# Patient Record
Sex: Female | Born: 1940 | Race: White | Hispanic: No | Marital: Married | Smoking: Never smoker
Health system: Southern US, Community
[De-identification: ages and names within clinical notes are randomized; demographics above are authoritative.]

## PROBLEM LIST (undated history)

## (undated) DIAGNOSIS — I5022 Chronic systolic (congestive) heart failure: Secondary | ICD-10-CM

## (undated) DIAGNOSIS — E785 Hyperlipidemia, unspecified: Secondary | ICD-10-CM

## (undated) DIAGNOSIS — J45909 Unspecified asthma, uncomplicated: Secondary | ICD-10-CM

## (undated) DIAGNOSIS — N189 Chronic kidney disease, unspecified: Secondary | ICD-10-CM

## (undated) DIAGNOSIS — B019 Varicella without complication: Secondary | ICD-10-CM

## (undated) DIAGNOSIS — C50919 Malignant neoplasm of unspecified site of unspecified female breast: Secondary | ICD-10-CM

## (undated) DIAGNOSIS — R002 Palpitations: Secondary | ICD-10-CM

## (undated) DIAGNOSIS — T7840XA Allergy, unspecified, initial encounter: Secondary | ICD-10-CM

## (undated) DIAGNOSIS — K5900 Constipation, unspecified: Secondary | ICD-10-CM

## (undated) DIAGNOSIS — K635 Polyp of colon: Secondary | ICD-10-CM

## (undated) DIAGNOSIS — I2510 Atherosclerotic heart disease of native coronary artery without angina pectoris: Secondary | ICD-10-CM

## (undated) DIAGNOSIS — E079 Disorder of thyroid, unspecified: Secondary | ICD-10-CM

## (undated) DIAGNOSIS — I255 Ischemic cardiomyopathy: Secondary | ICD-10-CM

## (undated) DIAGNOSIS — Z974 Presence of external hearing-aid: Secondary | ICD-10-CM

## (undated) DIAGNOSIS — E039 Hypothyroidism, unspecified: Secondary | ICD-10-CM

## (undated) DIAGNOSIS — M1990 Unspecified osteoarthritis, unspecified site: Secondary | ICD-10-CM

## (undated) DIAGNOSIS — Z923 Personal history of irradiation: Secondary | ICD-10-CM

## (undated) DIAGNOSIS — H269 Unspecified cataract: Secondary | ICD-10-CM

## (undated) HISTORY — DX: Disorder of thyroid, unspecified: E07.9

## (undated) HISTORY — DX: Unspecified cataract: H26.9

## (undated) HISTORY — DX: Atherosclerotic heart disease of native coronary artery without angina pectoris: I25.10

## (undated) HISTORY — DX: Polyp of colon: K63.5

## (undated) HISTORY — DX: Unspecified asthma, uncomplicated: J45.909

## (undated) HISTORY — PX: COLONOSCOPY W/ POLYPECTOMY: SHX1380

## (undated) HISTORY — PX: FINGER FRACTURE SURGERY: SHX638

## (undated) HISTORY — PX: COLONOSCOPY: SHX174

## (undated) HISTORY — DX: Varicella without complication: B01.9

## (undated) HISTORY — DX: Chronic systolic (congestive) heart failure: I50.22

## (undated) HISTORY — DX: Malignant neoplasm of unspecified site of unspecified female breast: C50.919

## (undated) HISTORY — PX: ROTATOR CUFF REPAIR: SHX139

## (undated) HISTORY — DX: Allergy, unspecified, initial encounter: T78.40XA

## (undated) HISTORY — DX: Ischemic cardiomyopathy: I25.5

## (undated) HISTORY — DX: Hyperlipidemia, unspecified: E78.5

## (undated) HISTORY — PX: JOINT REPLACEMENT: SHX530

---
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
## 1979-08-22 HISTORY — PX: ABDOMINAL HYSTERECTOMY: SHX81

## 1999-09-09 ENCOUNTER — Encounter (INDEPENDENT_AMBULATORY_CARE_PROVIDER_SITE_OTHER): Payer: Self-pay | Admitting: Specialist

## 1999-09-09 ENCOUNTER — Other Ambulatory Visit: Admission: RE | Admit: 1999-09-09 | Discharge: 1999-09-09 | Payer: Self-pay | Admitting: Gastroenterology

## 2007-10-16 ENCOUNTER — Ambulatory Visit: Payer: Self-pay | Admitting: Gastroenterology

## 2007-10-29 ENCOUNTER — Ambulatory Visit: Payer: Self-pay | Admitting: Gastroenterology

## 2009-08-21 LAB — HM COLONOSCOPY

## 2010-08-21 HISTORY — PX: APPENDECTOMY: SHX54

## 2010-08-21 LAB — HM PAP SMEAR

## 2011-04-08 ENCOUNTER — Ambulatory Visit: Payer: Self-pay | Admitting: Family Medicine

## 2011-04-08 ENCOUNTER — Observation Stay: Payer: Self-pay | Admitting: Surgery

## 2011-04-12 LAB — PATHOLOGY REPORT

## 2011-06-22 LAB — HM MAMMOGRAPHY

## 2012-05-01 ENCOUNTER — Ambulatory Visit (INDEPENDENT_AMBULATORY_CARE_PROVIDER_SITE_OTHER): Payer: Medicare Other | Admitting: Internal Medicine

## 2012-05-01 ENCOUNTER — Encounter: Payer: Self-pay | Admitting: Internal Medicine

## 2012-05-01 VITALS — BP 130/70 | HR 64 | Temp 98.6°F | Ht 61.5 in | Wt 153.5 lb

## 2012-05-01 DIAGNOSIS — E785 Hyperlipidemia, unspecified: Secondary | ICD-10-CM

## 2012-05-01 DIAGNOSIS — I10 Essential (primary) hypertension: Secondary | ICD-10-CM

## 2012-05-01 DIAGNOSIS — Z1239 Encounter for other screening for malignant neoplasm of breast: Secondary | ICD-10-CM

## 2012-05-01 DIAGNOSIS — Z23 Encounter for immunization: Secondary | ICD-10-CM

## 2012-05-01 DIAGNOSIS — E039 Hypothyroidism, unspecified: Secondary | ICD-10-CM | POA: Insufficient documentation

## 2012-05-01 DIAGNOSIS — E559 Vitamin D deficiency, unspecified: Secondary | ICD-10-CM | POA: Insufficient documentation

## 2012-05-01 DIAGNOSIS — D649 Anemia, unspecified: Secondary | ICD-10-CM

## 2012-05-01 MED ORDER — ACEBUTOLOL HCL 200 MG PO CAPS: 200.0000 mg | ORAL_CAPSULE | Freq: Every day | ORAL | Status: DC

## 2012-05-01 MED ORDER — LEVOTHYROXINE SODIUM 75 MCG PO TABS: 75.0000 ug | ORAL_TABLET | Freq: Every day | ORAL | Status: DC

## 2012-05-01 NOTE — Progress Notes (Signed)
Subjective:    Patient ID: Gwendolyn King, female    DOB: Dec 04, 1940, 71 y.o.   MRN: [id number]  HPI 71 year old female with history of allergies, hypothyroidism presents to establish care. She reports she is generally feeling well. She reports full compliance with her medications. Her allergies are well controlled with current medications. She notes some stress in caring for her elderly mother. Aside from that, she reports she is doing well. She tries to follow a healthy diet and get regular physical activity with [employer]. She questions today about the utility of calcium supplementation. She notes a family history of osteoporosis.  Outpatient Encounter Prescriptions as of 05/01/2012  Medication Sig Dispense Refill  . acebutolol (SECTRAL) 200 MG capsule Take 1 capsule (200 mg total) by mouth daily.  90 capsule  4  . aspirin 81 MG tablet Take 81 mg by mouth daily.      . Biotin (BIOTIN 5000) 5 MG CAPS Take 1 capsule by mouth daily.      . Cholecalciferol (VITAMIN D-3) 1000 UNITS CAPS Take 1 capsule by mouth daily.      . Coenzyme Q10 (COQ-10) 100 MG CAPS Take 1 capsule by mouth daily.      . fexofenadine (ALLEGRA) 180 MG tablet Take 180 mg by mouth daily.      . fluticasone (FLONASE) 50 MCG/ACT nasal spray Place 2 sprays into the nose daily.      . furosemide (LASIX) 20 MG tablet Take 20 mg by mouth as needed.      Marland Kitchen levothyroxine (SYNTHROID) 75 MCG tablet Take 1 tablet (75 mcg total) by mouth daily. Brand name only  90 tablet  4  . Multiple Vitamin (MULTIVITAMIN) tablet Take 1 tablet by mouth daily.      Marland Kitchen DISCONTD: acebutolol (SECTRAL) 200 MG capsule Take 200 mg by mouth daily.      Marland Kitchen DISCONTD: levothyroxine (SYNTHROID) 75 MCG tablet Take 75 mcg by mouth daily. Brand name only        Review of Systems  Constitutional: Negative for fever, chills, appetite change, fatigue and unexpected weight change.  HENT: Negative for ear pain, congestion, sore throat, trouble swallowing, neck pain, voice  change and sinus pressure.   Eyes: Negative for visual disturbance.  Respiratory: Negative for cough, shortness of breath, wheezing and stridor.   Cardiovascular: Negative for chest pain, palpitations and leg swelling.  Gastrointestinal: Negative for nausea, vomiting, abdominal pain, diarrhea, constipation, blood in stool, abdominal distention and anal bleeding.  Genitourinary: Negative for dysuria and flank pain.  Musculoskeletal: Negative for myalgias, arthralgias and gait problem.  Skin: Negative for color change and rash.  Neurological: Negative for dizziness and headaches.  Hematological: Negative for adenopathy. Does not bruise/bleed easily.  Psychiatric/Behavioral: Negative for suicidal ideas, disturbed wake/sleep cycle and dysphoric mood. The patient is not nervous/anxious.        Objective:   Physical Exam  Constitutional: She is oriented to person, place, and time. She appears well-developed and well-nourished. No distress.  HENT:  Head: Normocephalic and atraumatic.  Right Ear: External ear normal.  Left Ear: External ear normal.  Nose: Nose normal.  Mouth/Throat: Oropharynx is clear and moist. No oropharyngeal exudate.  Eyes: Conjunctivae normal are normal. Pupils are equal, round, and reactive to light. Right eye exhibits no discharge. Left eye exhibits no discharge. No scleral icterus.  Neck: Normal range of motion. Neck supple. No tracheal deviation present. No thyromegaly present.  Cardiovascular: Normal rate, regular rhythm, normal heart sounds and intact distal  pulses.  Exam reveals no gallop and no friction rub.   No murmur heard. Pulmonary/Chest: Effort normal and breath sounds normal. No respiratory distress. She has no wheezes. She has no rales. She exhibits no tenderness.  Abdominal: Soft. Bowel sounds are normal. She exhibits no distension. There is no tenderness.  Musculoskeletal: Normal range of motion. She exhibits no edema and no tenderness.  Lymphadenopathy:     She has no cervical adenopathy.  Neurological: She is alert and oriented to person, place, and time. No cranial nerve deficit. She exhibits normal muscle tone. Coordination normal.  Skin: Skin is warm and dry. No rash noted. She is not diaphoretic. No erythema. No pallor.  Psychiatric: She has a normal mood and affect. Her behavior is normal. Judgment and thought content normal.          Assessment & Plan:

## 2012-05-01 NOTE — Assessment & Plan Note (Signed)
Symptomatically doing well. Will check TSH with labs in November 2013. Continue Synthroid.

## 2012-05-01 NOTE — Assessment & Plan Note (Signed)
Blood pressure well-controlled. Will check renal function with labs in November 2013. Will request records on previous evaluation and management.

## 2012-05-01 NOTE — Assessment & Plan Note (Signed)
Patient with history of vitamin D deficiency in the past. We'll continue 1000 units of vitamin D daily. We discussed the potential risk of calcium supplementation. Will plan to get calcium through diet only. Followup in November 2013

## 2012-07-02 ENCOUNTER — Encounter: Payer: Medicare Other | Admitting: Internal Medicine

## 2012-07-05 ENCOUNTER — Ambulatory Visit (INDEPENDENT_AMBULATORY_CARE_PROVIDER_SITE_OTHER): Payer: Medicare Other | Admitting: Internal Medicine

## 2012-07-05 ENCOUNTER — Encounter: Payer: Self-pay | Admitting: Internal Medicine

## 2012-07-05 VITALS — BP 112/72 | HR 66 | Temp 98.7°F | Ht 61.5 in | Wt 152.2 lb

## 2012-07-05 DIAGNOSIS — Z23 Encounter for immunization: Secondary | ICD-10-CM

## 2012-07-05 DIAGNOSIS — E039 Hypothyroidism, unspecified: Secondary | ICD-10-CM

## 2012-07-05 DIAGNOSIS — I10 Essential (primary) hypertension: Secondary | ICD-10-CM

## 2012-07-05 DIAGNOSIS — Z0000 Encounter for general adult medical examination without abnormal findings: Secondary | ICD-10-CM

## 2012-07-05 MED ORDER — LEVOTHYROXINE SODIUM 75 MCG PO TABS: 75.0000 ug | ORAL_TABLET | Freq: Every day | ORAL | Status: DC

## 2012-07-05 MED ORDER — ACEBUTOLOL HCL 200 MG PO CAPS: 200.0000 mg | ORAL_CAPSULE | Freq: Every day | ORAL | Status: DC

## 2012-07-05 NOTE — Progress Notes (Signed)
Subjective:    Patient ID: Gwendolyn King, female    DOB: [birth year], 71 y.o.   MRN: [id number]  HPI The patient is here for annual Medicare wellness examination and management of other chronic and acute problems.   The risk factors are reflected in the social history.  The roster of all physicians providing medical care to patient - is listed in the Snapshot section of the chart.  Activities of daily living:  The patient is 100% independent in all ADLs: dressing, toileting, feeding as well as independent mobility  Home safety : The patient has smoke detectors in the home. They wear seatbelts.  There are locked firearms at home. There is no violence in the home.   There is no risks for hepatitis, STDs or HIV. There is no history of blood transfusion. They have no travel history to infectious disease endemic areas of the world.  The patient has seen their dentist in the last six month. (Dr. Rubye Oaks) They have seen their eye doctor in the last year. (Dr. Covert - [facility]) No issues with hearing. They have deferred audiologic testing in the last year.  Chronic tinnitis. They do not  have excessive sun exposure. Discussed the need for sun protection: hats, long sleeves and use of sunscreen if there is significant sun exposure. Dermatologist - Gwendolyn King.  Diet: the importance of a healthy diet is discussed. They do have a healthy diet.  The benefits of regular aerobic exercise were discussed. She participates in [employer] 4 x per week.  Depression screen: there are no signs or vegative symptoms of depression- irritability, change in appetite, anhedonia, sadness/tearfullness.  Cognitive assessment: the patient manages all their financial and personal affairs and is actively engaged. They could relate day,date,year and events.  The following portions of the patient's history were reviewed and updated as appropriate: allergies, current medications, past family history, past medical history,  past  surgical history, past social history  and problem list.  Visual acuity was not assessed per patient preference since she has regular follow up with her ophthalmologist. Hearing and body mass index were assessed and reviewed.   During the course of the visit the patient was educated and counseled about appropriate screening and preventive services including : fall prevention , diabetes screening, nutrition counseling, colorectal cancer screening, and recommended immunizations.    HCPOA - pending.  Outpatient Encounter Prescriptions as of 07/05/2012  Medication Sig Dispense Refill  . acebutolol (SECTRAL) 200 MG capsule Take 1 capsule (200 mg total) by mouth daily.  90 capsule  4  . aspirin 81 MG tablet Take 81 mg by mouth daily.      . Biotin (BIOTIN 5000) 5 MG CAPS Take 1 capsule by mouth daily.      . Cholecalciferol (VITAMIN D-3) 1000 UNITS CAPS Take 1 capsule by mouth daily.      . Coenzyme Q10 (COQ-10) 100 MG CAPS Take 1 capsule by mouth daily.      . fexofenadine (ALLEGRA) 180 MG tablet Take 180 mg by mouth daily.      . fluticasone (FLONASE) 50 MCG/ACT nasal spray Place 2 sprays into the nose daily.      . furosemide (LASIX) 20 MG tablet Take 20 mg by mouth as needed.      Marland Kitchen levothyroxine (SYNTHROID) 75 MCG tablet Take 1 tablet (75 mcg total) by mouth daily. Brand name only  90 tablet  4  . Multiple Vitamin (MULTIVITAMIN) tablet Take 1 tablet by mouth daily.      . [  DISCONTINUED] acebutolol (SECTRAL) 200 MG capsule Take 1 capsule (200 mg total) by mouth daily.  90 capsule  4  . [DISCONTINUED] levothyroxine (SYNTHROID) 75 MCG tablet Take 1 tablet (75 mcg total) by mouth daily. Brand name only  90 tablet  4    BP 112/72  Pulse 66  Temp 98.7 F (37.1 C) (Oral)  Ht 5' 1.5" (1.562 m)  Wt 152 lb 4 oz (69.06 kg)  BMI 28.30 kg/m2  SpO2 97%    Review of Systems  Constitutional: Negative for fever, chills, appetite change, fatigue and unexpected weight change.  HENT: Negative for ear  pain, congestion, sore throat, trouble swallowing, neck pain, voice change and sinus pressure.   Eyes: Negative for visual disturbance.  Respiratory: Negative for cough, shortness of breath, wheezing and stridor.   Cardiovascular: Negative for chest pain, palpitations and leg swelling.  Gastrointestinal: Negative for nausea, vomiting, abdominal pain, diarrhea, constipation, blood in stool, abdominal distention and anal bleeding.  Genitourinary: Negative for dysuria and flank pain.  Musculoskeletal: Negative for myalgias, arthralgias and gait problem.  Skin: Negative for color change and rash.  Neurological: Negative for dizziness and headaches.  Hematological: Negative for adenopathy. Does not bruise/bleed easily.  Psychiatric/Behavioral: Negative for suicidal ideas, sleep disturbance and dysphoric mood. The patient is not nervous/anxious.        Objective:   Physical Exam  Constitutional: She is oriented to person, place, and time. She appears well-developed and well-nourished. No distress.  HENT:  Head: Normocephalic and atraumatic.  Right Ear: External ear normal.  Left Ear: External ear normal.  Nose: Nose normal.  Mouth/Throat: Oropharynx is clear and moist. No oropharyngeal exudate.  Eyes: Conjunctivae normal are normal. Pupils are equal, round, and reactive to light. Right eye exhibits no discharge. Left eye exhibits no discharge. No scleral icterus.  Neck: Normal range of motion. Neck supple. No tracheal deviation present. No thyromegaly present.  Cardiovascular: Normal rate, regular rhythm, normal heart sounds and intact distal pulses.  Exam reveals no gallop and no friction rub.   No murmur heard. Pulmonary/Chest: Effort normal and breath sounds normal. No accessory muscle usage. Not tachypneic. No respiratory distress. She has no decreased breath sounds. She has no wheezes. She has no rales. She exhibits no tenderness. Right breast exhibits no inverted nipple, no mass, no nipple  discharge, no skin change and no tenderness. Left breast exhibits no inverted nipple, no mass, no nipple discharge, no skin change and no tenderness. Breasts are symmetrical.  Abdominal: Soft. Bowel sounds are normal. She exhibits no distension. There is no tenderness.  Musculoskeletal: Normal range of motion. She exhibits no edema and no tenderness.  Lymphadenopathy:    She has no cervical adenopathy.  Neurological: She is alert and oriented to person, place, and time. No cranial nerve deficit. She exhibits normal muscle tone. Coordination normal.  Skin: Skin is warm and dry. No rash noted. She is not diaphoretic. No erythema. No pallor.  Psychiatric: She has a normal mood and affect. Her behavior is normal. Judgment and thought content normal.          Assessment & Plan:

## 2012-07-06 DIAGNOSIS — Z0000 Encounter for general adult medical examination without abnormal findings: Secondary | ICD-10-CM | POA: Insufficient documentation

## 2012-07-06 LAB — LIPID PANEL
Cholesterol: 243 mg/dL — ABNORMAL HIGH (ref 0–200)
HDL: 42 mg/dL
Total CHOL/HDL Ratio: 5.8 ratio
Triglycerides: 454 mg/dL — ABNORMAL HIGH

## 2012-07-06 LAB — CBC WITH DIFFERENTIAL/PLATELET
Basophils Absolute: 0 10*3/uL (ref 0.0–0.1)
Basophils Relative: 0 % (ref 0–1)
Eosinophils Absolute: 0.3 10*3/uL (ref 0.0–0.7)
Eosinophils Relative: 3 % (ref 0–5)
HCT: 43.1 % (ref 36.0–46.0)
Hemoglobin: 14.9 g/dL (ref 12.0–15.0)
Lymphocytes Relative: 39 % (ref 12–46)
Lymphs Abs: 3.4 10*3/uL (ref 0.7–4.0)
MCH: 29.6 pg (ref 26.0–34.0)
MCHC: 34.6 g/dL (ref 30.0–36.0)
MCV: 85.7 fL (ref 78.0–100.0)
Monocytes Absolute: 0.7 10*3/uL (ref 0.1–1.0)
Monocytes Relative: 8 % (ref 3–12)
Neutro Abs: 4.4 10*3/uL (ref 1.7–7.7)
Neutrophils Relative %: 50 % (ref 43–77)
Platelets: 155 10*3/uL (ref 150–400)
RBC: 5.03 MIL/uL (ref 3.87–5.11)
RDW: 14.1 % (ref 11.5–15.5)
WBC: 8.8 10*3/uL (ref 4.0–10.5)

## 2012-07-06 LAB — COMPREHENSIVE METABOLIC PANEL WITH GFR
ALT: 18 U/L (ref 0–35)
AST: 18 U/L (ref 0–37)
Albumin: 4.3 g/dL (ref 3.5–5.2)
Alkaline Phosphatase: 56 U/L (ref 39–117)
BUN: 38 mg/dL — ABNORMAL HIGH (ref 6–23)
CO2: 28 meq/L (ref 19–32)
Calcium: 10 mg/dL (ref 8.4–10.5)
Chloride: 102 meq/L (ref 96–112)
Creat: 1.28 mg/dL — ABNORMAL HIGH (ref 0.50–1.10)
Glucose, Bld: 71 mg/dL (ref 70–99)
Potassium: 4.7 meq/L (ref 3.5–5.3)
Sodium: 140 meq/L (ref 135–145)
Total Bilirubin: 0.5 mg/dL (ref 0.3–1.2)
Total Protein: 6.6 g/dL (ref 6.0–8.3)

## 2012-07-06 LAB — TSH: TSH: 0.957 u[IU]/mL (ref 0.350–4.500)

## 2012-07-06 LAB — VITAMIN D 25 HYDROXY (VIT D DEFICIENCY, FRACTURES): Vit D, 25-Hydroxy: 44 ng/mL (ref 30–89)

## 2012-07-06 NOTE — Assessment & Plan Note (Signed)
General medical exam including breast exam normal today. Pap deferred as Pap smear up-to-date. Health maintenance is up to date. Appropriate screening performed. Will check labs today including CBC, CMP, lipid profile. Followup in 6 months or sooner as needed.

## 2012-07-24 ENCOUNTER — Telehealth: Payer: Self-pay | Admitting: Internal Medicine

## 2012-07-24 ENCOUNTER — Other Ambulatory Visit (INDEPENDENT_AMBULATORY_CARE_PROVIDER_SITE_OTHER): Payer: Medicare Other

## 2012-07-24 DIAGNOSIS — D649 Anemia, unspecified: Secondary | ICD-10-CM

## 2012-07-24 DIAGNOSIS — E039 Hypothyroidism, unspecified: Secondary | ICD-10-CM

## 2012-07-24 DIAGNOSIS — E559 Vitamin D deficiency, unspecified: Secondary | ICD-10-CM

## 2012-07-24 DIAGNOSIS — E785 Hyperlipidemia, unspecified: Secondary | ICD-10-CM

## 2012-07-24 LAB — CBC WITH DIFFERENTIAL/PLATELET
Basophils Absolute: 0 10*3/uL (ref 0.0–0.1)
Basophils Relative: 0.6 % (ref 0.0–3.0)
Eosinophils Absolute: 0.3 10*3/uL (ref 0.0–0.7)
Eosinophils Relative: 6.4 % — ABNORMAL HIGH (ref 0.0–5.0)
HCT: 41.7 % (ref 36.0–46.0)
Hemoglobin: 13.8 g/dL (ref 12.0–15.0)
Lymphocytes Relative: 36.4 % (ref 12.0–46.0)
Lymphs Abs: 1.9 10*3/uL (ref 0.7–4.0)
MCHC: 33.1 g/dL (ref 30.0–36.0)
MCV: 88 fl (ref 78.0–100.0)
Monocytes Absolute: 0.5 10*3/uL (ref 0.1–1.0)
Monocytes Relative: 9 % (ref 3.0–12.0)
Neutro Abs: 2.4 10*3/uL (ref 1.4–7.7)
Neutrophils Relative %: 47.6 % (ref 43.0–77.0)
Platelets: 191 10*3/uL (ref 150.0–400.0)
RBC: 4.74 Mil/uL (ref 3.87–5.11)
RDW: 14.3 % (ref 11.5–14.6)
WBC: 5.1 10*3/uL (ref 4.5–10.5)

## 2012-07-24 LAB — LIPID PANEL
Cholesterol: 194 mg/dL (ref 0–200)
HDL: 44.1 mg/dL
LDL Cholesterol: 112 mg/dL — ABNORMAL HIGH (ref 0–99)
Total CHOL/HDL Ratio: 4
Triglycerides: 191 mg/dL — ABNORMAL HIGH (ref 0.0–149.0)
VLDL: 38.2 mg/dL (ref 0.0–40.0)

## 2012-07-24 LAB — TSH: TSH: 1.31 u[IU]/mL (ref 0.35–5.50)

## 2012-07-24 NOTE — Telephone Encounter (Signed)
Patient called in asking about her bill. I have sent an e mail to Annabell and will contact patient once I hear from her.

## 2012-07-25 LAB — VITAMIN D 25 HYDROXY (VIT D DEFICIENCY, FRACTURES): Vit D, 25-Hydroxy: 48 ng/mL (ref 30–89)

## 2012-08-12 ENCOUNTER — Encounter: Payer: Self-pay | Admitting: Internal Medicine

## 2012-09-09 ENCOUNTER — Telehealth: Payer: Self-pay | Admitting: Internal Medicine

## 2012-09-09 ENCOUNTER — Ambulatory Visit (INDEPENDENT_AMBULATORY_CARE_PROVIDER_SITE_OTHER): Payer: Medicare Other | Admitting: Internal Medicine

## 2012-09-09 ENCOUNTER — Encounter: Payer: Self-pay | Admitting: Internal Medicine

## 2012-09-09 VITALS — BP 124/82 | HR 64 | Temp 97.8°F | Resp 16 | Wt 155.5 lb

## 2012-09-09 DIAGNOSIS — N390 Urinary tract infection, site not specified: Secondary | ICD-10-CM | POA: Insufficient documentation

## 2012-09-09 DIAGNOSIS — J069 Acute upper respiratory infection, unspecified: Secondary | ICD-10-CM | POA: Insufficient documentation

## 2012-09-09 DIAGNOSIS — R300 Dysuria: Secondary | ICD-10-CM

## 2012-09-09 LAB — POCT URINALYSIS DIPSTICK
Bilirubin, UA: NEGATIVE
Glucose, UA: NEGATIVE
Ketones, UA: NEGATIVE
Nitrite, UA: NEGATIVE
Protein, UA: NEGATIVE
Spec Grav, UA: <=1.005
Urobilinogen, UA: 0.2
pH, UA: 7

## 2012-09-09 MED ORDER — LEVOFLOXACIN 500 MG PO TABS: 500.0000 mg | ORAL_TABLET | Freq: Every day | ORAL | Status: DC

## 2012-09-09 NOTE — Telephone Encounter (Signed)
Patient Information:  Caller Name: Arabella  Phone: [phone]  Patient: Janayah, Zavada  Gender: Female  DOB: 1941/05/15  Age: 72 Years  PCP: Ronna Polio (Adults only)  Office Follow Up:  Does the office need to follow up with this patient?: No  Instructions For The Office: N/A   Symptoms  Reason For Call & Symptoms: Having buring with urination.  Reviewed Health History In EMR: Yes  Reviewed Medications In EMR: Yes  Reviewed Allergies In EMR: Yes  Reviewed Surgeries / Procedures: Yes  Date of Onset of Symptoms: 09/09/2012  Guideline(s) Used:  Urination Pain - Female  Disposition Per Guideline:   See Today in Office  Reason For Disposition Reached:   Age > 50 years  Advice Given:  Fluids:   Drink extra fluids. Drink 8-10 glasses of liquids a day (Reason: to produce a dilute, non-irritating urine).  Cranberry Juice:   Some people think that drinking cranberry juice may help in fighting urinary tract infections. However, there is no good research that has ever proved this.  Dosage 100% Cranberry Juice: 1 oz (30 ml) twice a day.  Caution: Do not drink more than 16 oz (480 ml). Here is the reason: too much cranberry juice can also be irritating to the bladder.  Appointment Scheduled:  09/09/2012 16:00:00 Appointment Scheduled Provider:  Duncan Dull (Adults only)

## 2012-09-09 NOTE — Assessment & Plan Note (Addendum)
Currently viral given the mild HEENT  symptoms  I have explained that in viral URIS, an antibiotic will not help the symptoms and will increase the risk of developing diarrhea.,  Continue oral and nasal decongestants,  Ibuprofen 400 mg and tylenol 650 mq 8 hrs for aches and pains,  Delsym OTC for cough,  Will be taking levaquin for UTI. Call if fevers,  severe facial or ear pain pr grossly purulent nasal discharge or sputum develop.

## 2012-09-09 NOTE — Patient Instructions (Signed)
I am treating your for a urinary tract infection with an antibiotic that will also treat any bacterial infection you may be getting in your sinuses,  chest or ears (Levaquin)   I also advise use of the following OTC meds to help with your other symptoms.   Take generic OTC benadryl 25 mg every 8 hours for the drainage,  Sudafed PE  10 to 30 mg every 8 hours for the congestion, you may substitute Afrin nasal spray for the nighttime dose of sudafed PE  If needed to prevent insomnia.  flushes your sinuses twice daily with Simply Saline (do over the sink because if you do it right you will spit out globs of mucus)  Use OTC  Delsym   FOR THE COUGH.  Gargle with salt water as needed for sore throat.

## 2012-09-09 NOTE — Progress Notes (Signed)
Patient ID: Gwendolyn King, female   DOB: 1941/04/06, 72 y.o.   MRN: [id number]  Patient Active Problem List  Diagnosis  . Hypothyroidism  . Hypertension  . Vitamin d deficiency  . Medicare annual wellness visit, subsequent  . UTI (lower urinary tract infection)  . Acute URI    Subjective:  CC:   Chief Complaint  Patient presents with  . Urinary Tract Infection    HPI:   Gwendolyn King a 72 y.o. female who presents Vaginal irritation started last week improved transiently with OTC summer's eve douche, then returned today . Woke up this morning with dysuria .  Last week had an acute URI and took a cold remedy ,  Having nasal drainage,  singus congestion, since Thursday chest aches with coughing . No fevers, no productive cough, buyt wasn't coughing until today   Past Medical History  Diagnosis Date  . Chicken pox   . Hyperlipidemia   . Colon polyp   . Thyroid disease   . Asthma     as a child, dad was smoker  . Allergy     hay fever, fall allergies    Past Surgical History  Procedure Date  . Appendectomy 2012  . Tonsillectomy and adenoidectomy 1963  . Abdominal hysterectomy 1981  . Vaginal delivery     3    The following portions of the patient's history were reviewed and updated as appropriate: Allergies, current medications, and problem list.    Review of Systems:  Patient denies , fevers, malaise, unintentional weight loss, skin rash, eye pain,  sore throat, dysphagia,  hemoptysis , dyspnea, wheezing, chest pain, palpitations, orthopnea, edema, abdominal pain, nausea, melena, diarrhea, constipation, flank pain, nocturia, numbness, tingling, seizures,  Focal weakness, Loss of consciousness,  Tremor, insomnia, depression, anxiety, and suicidal ideation.        History   Social History  . Marital Status: Married    Spouse Name: N/A    Number of Children: N/A  . Years of Education: N/A   Occupational History  . Not on file.   Social History Main  Topics  . Smoking status: Never Smoker   . Smokeless tobacco: Not on file  . Alcohol Use: No  . Drug Use: Not on file  . Sexually Active: Not on file   Other Topics Concern  . Not on file   Social History Narrative   Lives with husband in [place]. 3 children.Work - retired, Radio producer, teacherDiet - regular, limits sugar, eats small mealsExercise - [employer] 4 days per week    Objective:  BP 124/82  Pulse 64  Temp 97.8 F (36.6 C) (Oral)  Resp 16  Wt 155 lb 8 oz (70.534 kg)  SpO2 99%  General appearance: alert, cooperative and appears stated age Ears: normal TM's and external ear canals both ears Throat: lips, mucosa, and tongue normal; teeth and gums normal Neck: no adenopathy, no carotid bruit, supple, symmetrical, trachea midline and thyroid not enlarged, symmetric, no tenderness/mass/nodules Back: symmetric, no curvature. ROM normal. No CVA tenderness. Lungs: clear to auscultation bilaterally Heart: regular rate and rhythm, S1, S2 normal, no murmur, click, rub or gallop Abdomen: soft, non-tender; bowel sounds normal; no masses,  no organomegaly Pulses: 2+ and symmetric Skin: Skin color, texture, turgor normal. No rashes or lesions Lymph nodes: Cervical, supraclavicular, and axillary nodes normal.  Assessment and Plan:  UTI (lower urinary tract infection) Empiric tx with levaquin  Acute URI Currently viral given the mild HEENT  symptoms  I have explained that in viral URIS, an antibiotic will not help the symptoms and will increase the risk of developing diarrhea.,  Continue oral and nasal decongestants,  Ibuprofen 400 mg and tylenol 650 mq 8 hrs for aches and pains,  Delsym OTC for cough,  Will be taking levaquin for UTI. Call if fevers,  severe facial or ear pain pr grossly purulent nasal discharge or sputum develop.    Updated Medication List Outpatient Encounter Prescriptions as of 09/09/2012  Medication Sig Dispense Refill  . acebutolol (SECTRAL) 200 MG  capsule Take 1 capsule (200 mg total) by mouth daily.  90 capsule  4  . aspirin 81 MG tablet Take 81 mg by mouth daily.      . Biotin (BIOTIN 5000) 5 MG CAPS Take 1 capsule by mouth daily.      . Cholecalciferol (VITAMIN D-3) 1000 UNITS CAPS Take 1 capsule by mouth daily.      . Coenzyme Q10 (COQ-10) 100 MG CAPS Take 1 capsule by mouth daily.      . fexofenadine (ALLEGRA) 180 MG tablet Take 180 mg by mouth daily.      . fluticasone (FLONASE) 50 MCG/ACT nasal spray Place 2 sprays into the nose daily.      . furosemide (LASIX) 20 MG tablet Take 20 mg by mouth as needed.      Marland Kitchen levothyroxine (SYNTHROID) 75 MCG tablet Take 1 tablet (75 mcg total) by mouth daily. Brand name only  90 tablet  4  . Multiple Vitamin (MULTIVITAMIN) tablet Take 1 tablet by mouth daily.      Marland Kitchen levofloxacin (LEVAQUIN) 500 MG tablet Take 1 tablet (500 mg total) by mouth daily.  7 tablet  0     Orders Placed This Encounter  Procedures  . Urine culture  . POCT urinalysis dipstick    No Follow-up on file.

## 2012-09-09 NOTE — Assessment & Plan Note (Signed)
Empiric tx with levaquin

## 2012-09-10 LAB — URINE CULTURE
Colony Count: NO GROWTH
Organism ID, Bacteria: NO GROWTH

## 2012-10-03 NOTE — Telephone Encounter (Signed)
Gwendolyn King - I have added modifier 25 to E/M code as flu vaccine was given.  Gwendolyn King

## 2012-10-03 NOTE — Telephone Encounter (Signed)
Sent another email to Anabell asking about bill for patient.

## 2012-10-21 ENCOUNTER — Telehealth: Payer: Self-pay | Admitting: Internal Medicine

## 2012-10-21 DIAGNOSIS — E039 Hypothyroidism, unspecified: Secondary | ICD-10-CM

## 2012-10-21 NOTE — Telephone Encounter (Signed)
SYNTHROID) 75 MCG tablet  Brand name only # 90   acebutolol (SECTRAL) 200 MG capsule  # 90   furosemide (LASIX) 20 MG tablet  #90  For they entire year

## 2012-10-22 MED ORDER — LEVOTHYROXINE SODIUM 75 MCG PO TABS: 75.0000 ug | ORAL_TABLET | Freq: Every day | ORAL | Status: DC

## 2012-10-22 MED ORDER — ACEBUTOLOL HCL 200 MG PO CAPS: 200.0000 mg | ORAL_CAPSULE | Freq: Every day | ORAL | Status: DC

## 2012-10-22 MED ORDER — FUROSEMIDE 20 MG PO TABS: 20.0000 mg | ORAL_TABLET | ORAL | Status: DC | PRN

## 2012-10-22 NOTE — Telephone Encounter (Signed)
Medications has been escribed to [facility] per patient request

## 2012-10-23 ENCOUNTER — Encounter: Payer: Self-pay | Admitting: Gastroenterology

## 2012-10-29 ENCOUNTER — Telehealth: Payer: Self-pay | Admitting: Internal Medicine

## 2012-10-29 DIAGNOSIS — E039 Hypothyroidism, unspecified: Secondary | ICD-10-CM

## 2012-10-29 NOTE — Telephone Encounter (Signed)
Pt left voice mail message checking on [facility] rx [facility] She stated she [facility] and [facility] did not have rx.  Pt stated dr walker was going to call these in a month ago Please advise pt on status of rx

## 2012-10-30 ENCOUNTER — Telehealth: Payer: Self-pay | Admitting: Internal Medicine

## 2012-10-30 MED ORDER — FLUTICASONE PROPIONATE 50 MCG/ACT NA SUSP: 2.0000 | Freq: Every day | NASAL | Status: DC

## 2012-10-30 MED ORDER — FUROSEMIDE 20 MG PO TABS: 20.0000 mg | ORAL_TABLET | ORAL | Status: DC | PRN

## 2012-10-30 MED ORDER — ACEBUTOLOL HCL 200 MG PO CAPS: 200.0000 mg | ORAL_CAPSULE | Freq: Every day | ORAL | Status: DC

## 2012-10-30 MED ORDER — LEVOTHYROXINE SODIUM 75 MCG PO TABS: 75.0000 ug | ORAL_TABLET | Freq: Every day | ORAL | Status: DC

## 2012-10-30 NOTE — Telephone Encounter (Signed)
Spoke with patient, she stated she called for refill and they should have been sent to [facility]. Informed patient that I would send her refills today to [facility] and from here on out they can send [place] the request.

## 2012-10-30 NOTE — Telephone Encounter (Signed)
LMTCB Medications were sent to [facility] on 10/22/12, patient need to call back to let us know if that is the correct pharmacy and exactly what medications is she requesting refills on.

## 2012-10-30 NOTE — Telephone Encounter (Signed)
Pt dropped off forms for [facility]  For med refill In box

## 2012-10-31 NOTE — Telephone Encounter (Signed)
Medications has been faxed and patient is aware, another telephone encounter regarding this issue being addressed.

## 2012-12-16 ENCOUNTER — Telehealth: Payer: Self-pay | Admitting: Internal Medicine

## 2012-12-16 NOTE — Telephone Encounter (Signed)
Error made in ordering medication between [facility] and [facility].  Pt states should be [facility] but Rx was placed with [facility].  Pt does not want to pay 24$ copay to [facility], needs from [facility] because it is free.  Pt needing to get this corrected.

## 2013-01-14 ENCOUNTER — Telehealth: Payer: Self-pay | Admitting: *Deleted

## 2013-01-14 NOTE — Telephone Encounter (Signed)
Patient left a message on voicemail stating she would like to know how many records we received from [facility]. According the Epic, they sent 6 pages.

## 2013-01-15 NOTE — Telephone Encounter (Signed)
Left information on patient voicemail, she may or may not call back.

## 2013-04-18 ENCOUNTER — Other Ambulatory Visit: Payer: Self-pay | Admitting: Internal Medicine

## 2013-04-18 DIAGNOSIS — L237 Allergic contact dermatitis due to plants, except food: Secondary | ICD-10-CM

## 2013-04-18 MED ORDER — PREDNISONE (PAK) 10 MG PO TABS: ORAL_TABLET | ORAL | Status: DC

## 2013-05-20 ENCOUNTER — Other Ambulatory Visit: Payer: Self-pay | Admitting: *Deleted

## 2013-05-20 DIAGNOSIS — E039 Hypothyroidism, unspecified: Secondary | ICD-10-CM

## 2013-05-21 MED ORDER — LEVOTHYROXINE SODIUM 75 MCG PO TABS: 75.0000 ug | ORAL_TABLET | Freq: Every day | ORAL | Status: DC

## 2013-05-21 NOTE — Telephone Encounter (Signed)
Eprescribed.

## 2013-05-23 ENCOUNTER — Telehealth: Payer: Self-pay | Admitting: Internal Medicine

## 2013-05-23 NOTE — Telephone Encounter (Signed)
Would like a prescription for Levoxyl 75 mcg, she was taking this until last year when Levoxyl was not available. Now that it is available again she would like to be switched back to that and sent to [facility]. Is this ok?

## 2013-05-23 NOTE — Telephone Encounter (Signed)
The patient is wanting Levoxyl 75 mcg instead of Synthroid . The patient is wanting a call from the nurse.

## 2013-05-23 NOTE — Telephone Encounter (Signed)
That is fine 

## 2013-05-28 ENCOUNTER — Other Ambulatory Visit: Payer: Self-pay | Admitting: *Deleted

## 2013-05-29 MED ORDER — LEVOTHYROXINE SODIUM 75 MCG PO TABS: 75.0000 ug | ORAL_TABLET | Freq: Every day | ORAL | Status: DC

## 2013-05-29 NOTE — Telephone Encounter (Signed)
New Rx sent to the pharmacy

## 2013-05-30 ENCOUNTER — Telehealth: Payer: Self-pay | Admitting: *Deleted

## 2013-05-30 NOTE — Telephone Encounter (Signed)
Patient called and left another message on voicemail in reference to the Levoxyl. State they told her they did not receive the prescription. I called and spoke with Benicka at [facility], she state they did receive this prescription yesterday and they are processing it. Called patient she state she will check to make sure and I also informed her it was time for her to make an appointment with Dr. Dan Humphreys. She stated she would do this as well as schedule her mammogram since it is time for her yearly physical.

## 2013-07-15 LAB — HM MAMMOGRAPHY

## 2013-07-28 ENCOUNTER — Emergency Department: Payer: Self-pay | Admitting: Emergency Medicine

## 2013-07-30 ENCOUNTER — Encounter: Payer: Medicare Other | Admitting: Internal Medicine

## 2013-08-01 ENCOUNTER — Encounter: Payer: Self-pay | Admitting: Internal Medicine

## 2013-08-22 ENCOUNTER — Telehealth: Payer: Self-pay | Admitting: Internal Medicine

## 2013-08-22 NOTE — Telephone Encounter (Signed)
States she has CPE scheduled 1/6.  Is about out of her medication, Levoxyl, and states she should have labs done to get refills.  No orders in.  Does pt need to come in sooner than CPE for blood work.  No orders.

## 2013-08-22 NOTE — Telephone Encounter (Signed)
She could come in for labs prior to her physical. CMP, CBC, TSH, Free T4, lipids, urine microalbumin V70.0, 244.9

## 2013-08-22 NOTE — Telephone Encounter (Signed)
Please advise 

## 2013-08-25 NOTE — Telephone Encounter (Signed)
Left detailed message on voicemail.  

## 2013-08-26 ENCOUNTER — Other Ambulatory Visit (INDEPENDENT_AMBULATORY_CARE_PROVIDER_SITE_OTHER): Payer: Medicare HMO

## 2013-08-26 ENCOUNTER — Encounter: Payer: Self-pay | Admitting: Internal Medicine

## 2013-08-26 ENCOUNTER — Ambulatory Visit (INDEPENDENT_AMBULATORY_CARE_PROVIDER_SITE_OTHER): Payer: Medicare HMO | Admitting: Internal Medicine

## 2013-08-26 VITALS — BP 138/70 | HR 72 | Temp 98.2°F | Ht 61.0 in | Wt 159.0 lb

## 2013-08-26 DIAGNOSIS — M79609 Pain in unspecified limb: Secondary | ICD-10-CM

## 2013-08-26 DIAGNOSIS — M79629 Pain in unspecified upper arm: Secondary | ICD-10-CM | POA: Insufficient documentation

## 2013-08-26 DIAGNOSIS — E039 Hypothyroidism, unspecified: Secondary | ICD-10-CM

## 2013-08-26 DIAGNOSIS — Z0000 Encounter for general adult medical examination without abnormal findings: Secondary | ICD-10-CM

## 2013-08-26 LAB — MICROALBUMIN / CREATININE URINE RATIO
Creatinine,U: 89.9 mg/dL
Microalb Creat Ratio: 0.1 mg/g (ref 0.0–30.0)
Microalb, Ur: 0.1 mg/dL (ref 0.0–1.9)

## 2013-08-26 LAB — CBC WITH DIFFERENTIAL/PLATELET
Basophils Absolute: 0 10*3/uL (ref 0.0–0.1)
Basophils Relative: 0.4 % (ref 0.0–3.0)
Eosinophils Absolute: 0.2 10*3/uL (ref 0.0–0.7)
Eosinophils Relative: 3.5 % (ref 0.0–5.0)
HCT: 43.4 % (ref 36.0–46.0)
Hemoglobin: 15 g/dL (ref 12.0–15.0)
Lymphocytes Relative: 38.4 % (ref 12.0–46.0)
Lymphs Abs: 2.3 10*3/uL (ref 0.7–4.0)
MCHC: 34.6 g/dL (ref 30.0–36.0)
MCV: 85.4 fl (ref 78.0–100.0)
Monocytes Absolute: 0.5 10*3/uL (ref 0.1–1.0)
Monocytes Relative: 7.8 % (ref 3.0–12.0)
Neutro Abs: 3 10*3/uL (ref 1.4–7.7)
Neutrophils Relative %: 49.9 % (ref 43.0–77.0)
Platelets: 158 10*3/uL (ref 150.0–400.0)
RBC: 5.08 Mil/uL (ref 3.87–5.11)
RDW: 12.6 % (ref 11.5–14.6)
WBC: 6 10*3/uL (ref 4.5–10.5)

## 2013-08-26 LAB — COMPREHENSIVE METABOLIC PANEL WITH GFR
ALT: 16 U/L (ref 0–35)
AST: 20 U/L (ref 0–37)
Albumin: 4.3 g/dL (ref 3.5–5.2)
Alkaline Phosphatase: 59 U/L (ref 39–117)
BUN: 29 mg/dL — ABNORMAL HIGH (ref 6–23)
CO2: 30 meq/L (ref 19–32)
Calcium: 9.9 mg/dL (ref 8.4–10.5)
Chloride: 102 meq/L (ref 96–112)
Creatinine, Ser: 1.2 mg/dL (ref 0.4–1.2)
GFR: 46.23 mL/min — ABNORMAL LOW
Glucose, Bld: 81 mg/dL (ref 70–99)
Potassium: 4.2 meq/L (ref 3.5–5.1)
Sodium: 139 meq/L (ref 135–145)
Total Bilirubin: 0.8 mg/dL (ref 0.3–1.2)
Total Protein: 6.5 g/dL (ref 6.0–8.3)

## 2013-08-26 LAB — LIPID PANEL
Cholesterol: 219 mg/dL — ABNORMAL HIGH (ref 0–200)
HDL: 41.6 mg/dL
Total CHOL/HDL Ratio: 5
Triglycerides: 193 mg/dL — ABNORMAL HIGH (ref 0.0–149.0)
VLDL: 38.6 mg/dL (ref 0.0–40.0)

## 2013-08-26 LAB — LDL CHOLESTEROL, DIRECT: Direct LDL: 153.2 mg/dL

## 2013-08-26 LAB — T4, FREE: Free T4: 1.15 ng/dL (ref 0.60–1.60)

## 2013-08-26 LAB — TSH: TSH: 1.15 u[IU]/mL (ref 0.35–5.50)

## 2013-08-26 MED ORDER — LEVOXYL 75 MCG PO TABS: 75.0000 ug | ORAL_TABLET | Freq: Every day | ORAL | Status: DC

## 2013-08-26 MED ORDER — ACEBUTOLOL HCL 200 MG PO CAPS: 200.0000 mg | ORAL_CAPSULE | Freq: Every day | ORAL | Status: DC

## 2013-08-26 MED ORDER — FUROSEMIDE 20 MG PO TABS: 20.0000 mg | ORAL_TABLET | ORAL | Status: DC | PRN

## 2013-08-26 NOTE — Progress Notes (Signed)
Pre-visit discussion using our clinic review tool. No additional management support is needed unless otherwise documented below in the visit note.  

## 2013-08-26 NOTE — Progress Notes (Signed)
Subjective:    Patient ID: Gwendolyn King, female    DOB: 1941/03/04, 73 y.o.   MRN: [id number]  HPI The patient is here for annual Medicare wellness examination and management of other chronic and acute problems.   The risk factors are reflected in the social history.  The roster of all physicians providing medical care to patient - is listed in the Snapshot section of the chart.  Activities of daily living:  The patient is 100% independent in all ADLs: dressing, toileting, feeding as well as independent mobility  Home safety : The patient has smoke detectors in the home. They wear seatbelts.  There are locked firearms at home. There is no violence in the home.   There is no risks for hepatitis, STDs or HIV. There is no history of blood transfusion. They have no travel history to infectious disease endemic areas of the world.  The patient has seen their dentist in the last six month. (Dr. Thalia Bloodgood) They have seen their eye doctor in the last year. (Dr. Covert - [facility]) No issues with hearing. They have deferred audiologic testing in the last year.  Chronic tinnitis. They do not  have excessive sun exposure. Discussed the need for sun protection: hats, long sleeves and use of sunscreen if there is significant sun exposure. Dermatologist - Nehemiah Massed.  Diet: the importance of a healthy diet is discussed. They do have a healthy diet.  The benefits of regular aerobic exercise were discussed. She participates in [place] 4 x per week.  Depression screen: there are no signs or vegative symptoms of depression- irritability, change in appetite, anhedonia, sadness/tearfullness.  Cognitive assessment: the patient manages all their financial and personal affairs and is actively engaged. They could relate day,date,year and events.  The following portions of the patient's history were reviewed and updated as appropriate: allergies, current medications, past family history, past medical history,   past surgical history, past social history  and problem list.  Visual acuity was not assessed per patient preference since she has regular follow up with her ophthalmologist. Hearing and body mass index were assessed and reviewed.   During the course of the visit the patient was educated and counseled about appropriate screening and preventive services including : fall prevention , diabetes screening, nutrition counseling, colorectal cancer screening, and recommended immunizations.    HCPOA - pending.  Recent fall 4 weeks ago. Tripped over branch, fell onto right arm. Persistent right upper arm pain since that time. Doing PT exercises with no improvement. Had xray in ED which was normal.  Outpatient Encounter Prescriptions as of 08/26/2013  Medication Sig  . acebutolol (SECTRAL) 200 MG capsule Take 1 capsule (200 mg total) by mouth daily.  Marland Kitchen aspirin 81 MG tablet Take 81 mg by mouth daily.  . Biotin (BIOTIN 5000) 5 MG CAPS Take 1 capsule by mouth daily.  . Cholecalciferol (VITAMIN D-3) 1000 UNITS CAPS Take 1 capsule by mouth daily.  . Coenzyme Q10 (COQ-10) 100 MG CAPS Take 1 capsule by mouth daily.  . fexofenadine (ALLEGRA) 180 MG tablet Take 180 mg by mouth daily.  . furosemide (LASIX) 20 MG tablet Take 1 tablet (20 mg total) by mouth as needed. Take 20 mg by mouth as needed.  Marland Kitchen LEVOXYL 75 MCG tablet Take 1 tablet (75 mcg total) by mouth daily before breakfast.  . fluticasone (FLONASE) 50 MCG/ACT nasal spray Place 2 sprays into the nose daily.  . Multiple Vitamin (MULTIVITAMIN) tablet Take 1 tablet by mouth daily.  BP 138/70  Pulse 72  Temp(Src) 98.2 F (36.8 C) (Oral)  Ht 5\' 1"  (1.549 m)  Wt 159 lb (72.122 kg)  BMI 30.06 kg/m2  SpO2 97%  Review of Systems  Constitutional: Negative for fever, chills, appetite change, fatigue and unexpected weight change.  HENT: Negative for congestion, ear pain, sinus pressure, sore throat, trouble swallowing and voice change.   Eyes: Negative for  visual disturbance.  Respiratory: Negative for cough, shortness of breath, wheezing and stridor.   Cardiovascular: Negative for chest pain, palpitations and leg swelling.  Gastrointestinal: Negative for nausea, vomiting, abdominal pain, diarrhea, constipation, blood in stool, abdominal distention and anal bleeding.  Genitourinary: Negative for dysuria and flank pain.  Musculoskeletal: Positive for myalgias (right biceps pain). Negative for arthralgias, gait problem and neck pain.  Skin: Negative for color change and rash.  Neurological: Negative for dizziness and headaches.  Hematological: Negative for adenopathy. Does not bruise/bleed easily.  Psychiatric/Behavioral: Negative for suicidal ideas, sleep disturbance and dysphoric mood. The patient is not nervous/anxious.        Objective:   Physical Exam  Constitutional: She is oriented to person, place, and time. She appears well-developed and well-nourished. No distress.  HENT:  Head: Normocephalic and atraumatic.  Right Ear: External ear normal.  Left Ear: External ear normal.  Nose: Nose normal.  Mouth/Throat: Oropharynx is clear and moist. No oropharyngeal exudate.  Eyes: Conjunctivae are normal. Pupils are equal, round, and reactive to light. Right eye exhibits no discharge. Left eye exhibits no discharge. No scleral icterus.  Neck: Normal range of motion. Neck supple. No tracheal deviation present. No thyromegaly present.  Cardiovascular: Normal rate, regular rhythm, normal heart sounds and intact distal pulses.  Exam reveals no gallop and no friction rub.   No murmur heard. Pulmonary/Chest: Effort normal and breath sounds normal. No accessory muscle usage. Not tachypneic. No respiratory distress. She has no decreased breath sounds. She has no wheezes. She has no rales. She exhibits no tenderness. Right breast exhibits no inverted nipple, no mass, no nipple discharge, no skin change and no tenderness. Left breast exhibits no inverted  nipple, no mass, no nipple discharge, no skin change and no tenderness. Breasts are symmetrical.  Abdominal: Soft. Bowel sounds are normal. She exhibits no distension and no mass. There is no tenderness. There is no rebound and no guarding.  Musculoskeletal: Normal range of motion. She exhibits no edema.       Right shoulder: She exhibits tenderness and pain. She exhibits normal strength.       Arms: Lymphadenopathy:    She has no cervical adenopathy.  Neurological: She is alert and oriented to person, place, and time. No cranial nerve deficit. She exhibits normal muscle tone. Coordination normal.  Skin: Skin is warm and dry. No rash noted. She is not diaphoretic. No erythema. No pallor.  Psychiatric: She has a normal mood and affect. Her behavior is normal. Judgment and thought content normal.          Assessment & Plan:

## 2013-08-27 ENCOUNTER — Encounter: Payer: Self-pay | Admitting: *Deleted

## 2013-08-27 ENCOUNTER — Telehealth: Payer: Self-pay | Admitting: Internal Medicine

## 2013-08-27 NOTE — Assessment & Plan Note (Addendum)
Symptoms and exam are concerning for biceps tear and possible injury of rotator cuff given pain with rotation of her right arm. Will set up sports medicine evaluation. Question if the ultrasound might be helpful for diagnosis. Continue tramadol as needed for pain.

## 2013-08-27 NOTE — Assessment & Plan Note (Signed)
General medical exam including breast exam normal today except as noted. Pap and pelvic deferred as Pap completed 2012 normal and patient status post hysterectomy. Mammogram is up-to-date. Colonoscopy is up-to-date. Immunizations are up-to-date. Encouraged healthy diet and regular physical activity. Falls prevention discussed. No recent fall with injury to right arm addressed as above. Labs including CBC, CMP, lipid profile are pending.

## 2013-08-27 NOTE — Telephone Encounter (Signed)
Pt called regarding sports medicine referral.  States her husband has an appt 1/13 at [facility] with Dr. Katherina Right and she is asking if he can see her at the same time.  States her husband has also seen Dr. Alvina Chou at [facility] if he could see her 1/13.  Please contact on cell phone, may leave msg.

## 2013-08-28 NOTE — Telephone Encounter (Signed)
LVM for patient on home phone to return my call.

## 2013-08-29 ENCOUNTER — Other Ambulatory Visit: Payer: Self-pay | Admitting: *Deleted

## 2013-08-29 MED ORDER — FUROSEMIDE 20 MG PO TABS: 20.0000 mg | ORAL_TABLET | Freq: Every day | ORAL | Status: DC | PRN

## 2013-08-29 NOTE — Telephone Encounter (Signed)
Yes, daily prn

## 2013-08-29 NOTE — Telephone Encounter (Signed)
Pharmacy note:  The furosemide directions are missing a frequency of administration, please clarify how often this can be dosed

## 2013-08-29 NOTE — Telephone Encounter (Signed)
Does it need to read 20 mg daily prn?

## 2013-09-02 ENCOUNTER — Other Ambulatory Visit: Payer: Self-pay | Admitting: *Deleted

## 2013-09-02 NOTE — Telephone Encounter (Signed)
Call rec'd from [place], pt scheduled her own apt there. Looks like we referred to Dr. Tamala Julian, pt seeing Dr. Berenice Primas instead. Referral to [facility] underway

## 2013-09-03 ENCOUNTER — Telehealth: Payer: Self-pay | Admitting: Emergency Medicine

## 2013-09-03 NOTE — Telephone Encounter (Signed)
Referral to [facility] has been approved for pt to see Dr. Payton Doughty 4 visits exp 12/01/13. auth # [id number]

## 2014-01-15 ENCOUNTER — Other Ambulatory Visit: Payer: Self-pay | Admitting: Internal Medicine

## 2014-01-30 ENCOUNTER — Telehealth: Payer: Self-pay | Admitting: Internal Medicine

## 2014-01-30 NOTE — Telephone Encounter (Signed)
Received fax from [facility] back Auth# [id number] Treating Provider Dr Dorna Leitz # of Visits 6 Start Date 02/03/14 End Date 08/05/14 CPT 99499 DX 719.41

## 2014-02-26 ENCOUNTER — Ambulatory Visit: Payer: Medicare HMO | Admitting: Internal Medicine

## 2014-03-13 ENCOUNTER — Ambulatory Visit: Payer: Medicare HMO | Admitting: Internal Medicine

## 2014-03-18 ENCOUNTER — Other Ambulatory Visit: Payer: Self-pay | Admitting: Internal Medicine

## 2014-04-10 ENCOUNTER — Ambulatory Visit (INDEPENDENT_AMBULATORY_CARE_PROVIDER_SITE_OTHER): Payer: Medicare HMO | Admitting: Internal Medicine

## 2014-04-10 ENCOUNTER — Encounter: Payer: Self-pay | Admitting: Internal Medicine

## 2014-04-10 VITALS — BP 120/70 | HR 72 | Temp 98.2°F | Ht 61.0 in | Wt 162.5 lb

## 2014-04-10 DIAGNOSIS — I10 Essential (primary) hypertension: Secondary | ICD-10-CM

## 2014-04-10 DIAGNOSIS — M79609 Pain in unspecified limb: Secondary | ICD-10-CM

## 2014-04-10 DIAGNOSIS — E039 Hypothyroidism, unspecified: Secondary | ICD-10-CM

## 2014-04-10 DIAGNOSIS — Z23 Encounter for immunization: Secondary | ICD-10-CM

## 2014-04-10 DIAGNOSIS — M79621 Pain in right upper arm: Secondary | ICD-10-CM

## 2014-04-10 NOTE — Assessment & Plan Note (Signed)
Symptoms improving with PT. Will continue to monitor.

## 2014-04-10 NOTE — Progress Notes (Signed)
Pre visit review using our clinic review tool, if applicable. No additional management support is needed unless otherwise documented below in the visit note. 

## 2014-04-10 NOTE — Patient Instructions (Signed)
Labs today.  Pneumovax today.  Follow up in 6 months for Wellness Visit.

## 2014-04-10 NOTE — Addendum Note (Signed)
Addended by: Leeanne Rio on: 04/10/2014 11:32 AM   Modules accepted: Orders

## 2014-04-10 NOTE — Assessment & Plan Note (Signed)
Symptomatically doing well. Check thyroid function with labs today.

## 2014-04-10 NOTE — Assessment & Plan Note (Signed)
BP Readings from Last 3 Encounters:  04/10/14 120/70  08/26/13 138/70  09/09/12 124/82   BP well controlled with use of Furosemide alone. Will continue. Check renal function with labs today.

## 2014-04-10 NOTE — Progress Notes (Signed)
    Subjective:    Patient ID: Gwendolyn King, female    DOB: 1940-10-24, 73 y.o.   MRN: [id number]  HPI 73YO female presents for follow up.  Having some swelling in her legs. Taking Furosemide more regularly.  Plans to see ortho in 2 weeks. PT has been helping with pain. Continues to take [employer].  Review of Systems  Constitutional: Negative for fever, chills, appetite change, fatigue and unexpected weight change.  Eyes: Negative for visual disturbance.  Respiratory: Negative for shortness of breath.   Cardiovascular: Negative for chest pain and leg swelling.  Gastrointestinal: Negative for nausea, vomiting, abdominal pain, diarrhea and constipation.  Musculoskeletal: Positive for myalgias. Negative for arthralgias.  Skin: Negative for color change and rash.  Hematological: Negative for adenopathy. Does not bruise/bleed easily.  Psychiatric/Behavioral: Negative for dysphoric mood. The patient is not nervous/anxious.        Objective:    BP 120/70  Pulse 72  Temp(Src) 98.2 F (36.8 C) (Oral)  Ht 5\' 1"  (1.549 m)  Wt 162 lb 8 oz (73.71 kg)  BMI 30.72 kg/m2  SpO2 97% Physical Exam  Constitutional: She is oriented to person, place, and time. She appears well-developed and well-nourished. No distress.  HENT:  Head: Normocephalic and atraumatic.  Right Ear: External ear normal.  Left Ear: External ear normal.  Nose: Nose normal.  Mouth/Throat: Oropharynx is clear and moist. No oropharyngeal exudate.  Eyes: Conjunctivae are normal. Pupils are equal, round, and reactive to light. Right eye exhibits no discharge. Left eye exhibits no discharge. No scleral icterus.  Neck: Normal range of motion. Neck supple. No tracheal deviation present. No thyromegaly present.  Cardiovascular: Normal rate, regular rhythm, normal heart sounds and intact distal pulses.  Exam reveals no gallop and no friction rub.   No murmur heard. Pulmonary/Chest: Effort normal and breath sounds normal. No  accessory muscle usage. Not tachypneic. No respiratory distress. She has no decreased breath sounds. She has no wheezes. She has no rhonchi. She has no rales. She exhibits no tenderness.  Musculoskeletal: She exhibits no edema and no tenderness.       Right shoulder: She exhibits decreased range of motion and pain.  Lymphadenopathy:    She has no cervical adenopathy.  Neurological: She is alert and oriented to person, place, and time. No cranial nerve deficit. She exhibits normal muscle tone. Coordination normal.  Skin: Skin is warm and dry. No rash noted. She is not diaphoretic. No erythema. No pallor.  Psychiatric: She has a normal mood and affect. Her behavior is normal. Judgment and thought content normal.          Assessment & Plan:   Problem List Items Addressed This Visit     Unprioritized   Hypertension - Primary      BP Readings from Last 3 Encounters:  04/10/14 120/70  08/26/13 138/70  09/09/12 124/82   BP well controlled with use of Furosemide alone. Will continue. Check renal function with labs today.    Relevant Orders      Comprehensive metabolic panel   Hypothyroidism     Symptomatically doing well. Check thyroid function with labs today.    Relevant Orders      TSH   Upper arm pain     Symptoms improving with PT. Will continue to monitor.        Return in about 6 months (around 10/11/2014) for Wellness Visit.

## 2014-04-11 LAB — COMPREHENSIVE METABOLIC PANEL WITH GFR
ALT: 19 U/L (ref 0–35)
AST: 21 U/L (ref 0–37)
Albumin: 4.1 g/dL (ref 3.5–5.2)
Alkaline Phosphatase: 57 U/L (ref 39–117)
BUN: 29 mg/dL — ABNORMAL HIGH (ref 6–23)
CO2: 29 meq/L (ref 19–32)
Calcium: 9.9 mg/dL (ref 8.4–10.5)
Chloride: 102 meq/L (ref 96–112)
Creatinine, Ser: 1.1 mg/dL (ref 0.4–1.2)
GFR: 49.91 mL/min — ABNORMAL LOW
Glucose, Bld: 64 mg/dL — ABNORMAL LOW (ref 70–99)
Potassium: 3.9 meq/L (ref 3.5–5.1)
Sodium: 139 meq/L (ref 135–145)
Total Bilirubin: 0.6 mg/dL (ref 0.2–1.2)
Total Protein: 6.2 g/dL (ref 6.0–8.3)

## 2014-04-11 LAB — TSH: TSH: 0.91 u[IU]/mL (ref 0.35–4.50)

## 2014-04-13 ENCOUNTER — Telehealth: Payer: Self-pay | Admitting: Internal Medicine

## 2014-04-13 NOTE — Telephone Encounter (Signed)
Relevant patient education assigned to patient using Emmi. ° °

## 2014-05-07 ENCOUNTER — Encounter: Payer: Self-pay | Admitting: Internal Medicine

## 2014-05-18 ENCOUNTER — Ambulatory Visit (INDEPENDENT_AMBULATORY_CARE_PROVIDER_SITE_OTHER): Payer: Medicare HMO

## 2014-05-18 DIAGNOSIS — Z23 Encounter for immunization: Secondary | ICD-10-CM

## 2014-06-01 ENCOUNTER — Other Ambulatory Visit: Payer: Self-pay | Admitting: Internal Medicine

## 2014-06-02 ENCOUNTER — Encounter: Payer: Self-pay | Admitting: Internal Medicine

## 2014-06-02 NOTE — Telephone Encounter (Signed)
Looks like referral needs placed for authorization?

## 2014-06-11 ENCOUNTER — Telehealth: Payer: Self-pay

## 2014-06-11 DIAGNOSIS — Z1239 Encounter for other screening for malignant neoplasm of breast: Secondary | ICD-10-CM

## 2014-06-11 NOTE — Telephone Encounter (Signed)
The patient is hoping to get advice on if she needs a mammogram each year, or if she can go every other year. She stated she saw "on tv" that mammograms are only needed every other year.  Pt's callback - [phone]

## 2014-06-11 NOTE — Telephone Encounter (Signed)
I would still recommend mammograms every year. I follow the guidelines from the [employer].

## 2014-06-15 NOTE — Telephone Encounter (Signed)
Notified pt. Pt states that she is due for one next month if a referral can be placed for this.

## 2014-06-16 ENCOUNTER — Telehealth: Payer: Self-pay

## 2014-06-16 NOTE — Telephone Encounter (Signed)
When there is no referral in place you must send the request to the doctor or the nurse  Only they can enter the referral

## 2014-06-16 NOTE — Telephone Encounter (Signed)
The patient called and left a voice mail regarding a referral to [facility] for a rotator cuff injury.  I dont see this referral in place, but wanted to document the call and find out what else needs to be done.

## 2014-06-16 NOTE — Telephone Encounter (Signed)
Order placed

## 2014-06-16 NOTE — Addendum Note (Signed)
Addended by: Ronette Deter A on: 06/16/2014 10:50 PM   Modules accepted: Orders

## 2014-07-20 LAB — HM MAMMOGRAPHY: HM Mammogram: NEGATIVE

## 2014-08-05 ENCOUNTER — Encounter: Payer: Self-pay | Admitting: *Deleted

## 2014-08-24 ENCOUNTER — Telehealth: Payer: Self-pay | Admitting: Internal Medicine

## 2014-08-24 DIAGNOSIS — M25539 Pain in unspecified wrist: Secondary | ICD-10-CM

## 2014-08-24 NOTE — Telephone Encounter (Signed)
Patient called  Wanting a appt with her orthopedic Dr Berenice Primas  , she called their office and was told she need a referral from her PCP ,  Pt having wrist pt   .

## 2014-09-15 ENCOUNTER — Encounter: Payer: Self-pay | Admitting: Internal Medicine

## 2014-10-14 ENCOUNTER — Ambulatory Visit (INDEPENDENT_AMBULATORY_CARE_PROVIDER_SITE_OTHER): Payer: PPO | Admitting: Internal Medicine

## 2014-10-14 ENCOUNTER — Encounter: Payer: Self-pay | Admitting: Internal Medicine

## 2014-10-14 ENCOUNTER — Encounter (INDEPENDENT_AMBULATORY_CARE_PROVIDER_SITE_OTHER): Payer: Self-pay

## 2014-10-14 VITALS — BP 131/77 | HR 56 | Temp 97.8°F | Ht 61.25 in | Wt 160.2 lb

## 2014-10-14 DIAGNOSIS — Z1211 Encounter for screening for malignant neoplasm of colon: Secondary | ICD-10-CM | POA: Insufficient documentation

## 2014-10-14 DIAGNOSIS — E559 Vitamin D deficiency, unspecified: Secondary | ICD-10-CM

## 2014-10-14 DIAGNOSIS — E039 Hypothyroidism, unspecified: Secondary | ICD-10-CM

## 2014-10-14 DIAGNOSIS — Z0000 Encounter for general adult medical examination without abnormal findings: Secondary | ICD-10-CM

## 2014-10-14 LAB — CBC WITH DIFFERENTIAL/PLATELET
Basophils Absolute: 0 10*3/uL (ref 0.0–0.1)
Basophils Relative: 0.6 % (ref 0.0–3.0)
Eosinophils Absolute: 0.2 10*3/uL (ref 0.0–0.7)
Eosinophils Relative: 3.7 % (ref 0.0–5.0)
HCT: 43.6 % (ref 36.0–46.0)
Hemoglobin: 15.1 g/dL — ABNORMAL HIGH (ref 12.0–15.0)
Lymphocytes Relative: 35.3 % (ref 12.0–46.0)
Lymphs Abs: 2.3 10*3/uL (ref 0.7–4.0)
MCHC: 34.7 g/dL (ref 30.0–36.0)
MCV: 84.2 fl (ref 78.0–100.0)
Monocytes Absolute: 0.5 10*3/uL (ref 0.1–1.0)
Monocytes Relative: 7.7 % (ref 3.0–12.0)
Neutro Abs: 3.4 10*3/uL (ref 1.4–7.7)
Neutrophils Relative %: 52.7 % (ref 43.0–77.0)
Platelets: 154 10*3/uL (ref 150.0–400.0)
RBC: 5.17 Mil/uL — ABNORMAL HIGH (ref 3.87–5.11)
RDW: 12.8 % (ref 11.5–15.5)
WBC: 6.4 10*3/uL (ref 4.0–10.5)

## 2014-10-14 LAB — LIPID PANEL
Cholesterol: 213 mg/dL — ABNORMAL HIGH (ref 0–200)
HDL: 45.7 mg/dL
LDL Cholesterol: 134 mg/dL — ABNORMAL HIGH (ref 0–99)
NonHDL: 167.3
Total CHOL/HDL Ratio: 5
Triglycerides: 166 mg/dL — ABNORMAL HIGH (ref 0.0–149.0)
VLDL: 33.2 mg/dL (ref 0.0–40.0)

## 2014-10-14 LAB — COMPREHENSIVE METABOLIC PANEL WITH GFR
ALT: 15 U/L (ref 0–35)
AST: 20 U/L (ref 0–37)
Albumin: 4.2 g/dL (ref 3.5–5.2)
Alkaline Phosphatase: 56 U/L (ref 39–117)
BUN: 27 mg/dL — ABNORMAL HIGH (ref 6–23)
CO2: 31 meq/L (ref 19–32)
Calcium: 9.6 mg/dL (ref 8.4–10.5)
Chloride: 104 meq/L (ref 96–112)
Creatinine, Ser: 1.04 mg/dL (ref 0.40–1.20)
GFR: 55.4 mL/min — ABNORMAL LOW
Glucose, Bld: 88 mg/dL (ref 70–99)
Potassium: 4.2 meq/L (ref 3.5–5.1)
Sodium: 140 meq/L (ref 135–145)
Total Bilirubin: 0.8 mg/dL (ref 0.2–1.2)
Total Protein: 6.3 g/dL (ref 6.0–8.3)

## 2014-10-14 LAB — VITAMIN D 25 HYDROXY (VIT D DEFICIENCY, FRACTURES): VITD: 27.79 ng/mL — ABNORMAL LOW (ref 30.00–100.00)

## 2014-10-14 LAB — TSH: TSH: 1.1 u[IU]/mL (ref 0.35–4.50)

## 2014-10-14 MED ORDER — FUROSEMIDE 20 MG PO TABS: 20.0000 mg | ORAL_TABLET | Freq: Every day | ORAL | Status: DC

## 2014-10-14 MED ORDER — LEVOXYL 75 MCG PO TABS: ORAL_TABLET | ORAL | Status: DC

## 2014-10-14 MED ORDER — ACEBUTOLOL HCL 200 MG PO CAPS: 200.0000 mg | ORAL_CAPSULE | Freq: Every day | ORAL | Status: DC

## 2014-10-14 NOTE — Assessment & Plan Note (Signed)
General medical exam including breast exam normal today except as noted. Pap and pelvic deferred as Pap completed 2012 normal and patient status post hysterectomy. Mammogram is up-to-date and was reviewed. Colonoscopy ordered. Immunizations are up-to-date. Encouraged healthy diet and regular physical activity. Labs including CBC, CMP, lipid profile are pending.

## 2014-10-14 NOTE — Assessment & Plan Note (Signed)
Will check Vit D with labs.

## 2014-10-14 NOTE — Progress Notes (Signed)
Pre visit review using our clinic review tool, if applicable. No additional management support is needed unless otherwise documented below in the visit note. 

## 2014-10-14 NOTE — Patient Instructions (Signed)

## 2014-10-14 NOTE — Assessment & Plan Note (Signed)
Will check TSH with labs. Continue Levoxyl.

## 2014-10-14 NOTE — Progress Notes (Signed)
Subjective:    Patient ID: Gwendolyn King, female    DOB: 05/18/1941, 74 y.o.   MRN: [id number]  HPI  The patient is here for annual Medicare wellness examination and management of other chronic and acute problems.   The risk factors are reflected in the social history.  The roster of all physicians providing medical care to patient - is listed in the Snapshot section of the chart.  Activities of daily living:  The patient is 100% independent in all ADLs: dressing, toileting, feeding as well as independent mobility. Lives with husband. Has one cat.   Home safety : The patient has smoke detectors in the home. They wear seatbelts.  There are locked firearms at home. There is no violence in the home.   There is no risks for hepatitis, STDs or HIV. There is no history of blood transfusion. They have no travel history to infectious disease endemic areas of the world.  The patient has seen their dentist in the last six month. (Dr. Thalia Bloodgood) They have seen their eye doctor in the last year. (Dr. Covert - [facility]) No issues with hearing. They have deferred audiologic testing in the last year.  Chronic tinnitis. They do not  have excessive sun exposure. Discussed the need for sun protection: hats, long sleeves and use of sunscreen if there is significant sun exposure. Dermatologist - Nehemiah Massed.  Diet: the importance of a healthy diet is discussed. They do have a healthy diet.  The benefits of regular aerobic exercise were discussed. She participates in [place] 4 x per week.  Depression screen: there are no signs or vegative symptoms of depression- irritability, change in appetite, anhedonia, sadness/tearfullness.  Cognitive assessment: the patient manages all their financial and personal affairs and is actively engaged. They could relate day,date,year and events.  The following portions of the patient's history were reviewed and updated as appropriate: allergies, current medications, past  family history, past medical history,  past surgical history, past social history  and problem list.  Visual acuity was not assessed per patient preference since she has regular follow up with her ophthalmologist. Hearing and body mass index were assessed and reviewed.   During the course of the visit the patient was educated and counseled about appropriate screening and preventive services including : fall prevention , diabetes screening, nutrition counseling, colorectal cancer screening, and recommended immunizations.    HCPOA - husband, Antanette Richwine    Past medical, surgical, family and social history per today's encounter.  Review of Systems  Constitutional: Negative for fever, chills, appetite change, fatigue and unexpected weight change.  Eyes: Negative for visual disturbance.  Respiratory: Negative for shortness of breath.   Cardiovascular: Negative for chest pain and leg swelling.  Gastrointestinal: Negative for vomiting, abdominal pain, diarrhea and constipation.  Musculoskeletal: Negative for myalgias and arthralgias.  Skin: Negative for color change and rash.  Hematological: Negative for adenopathy. Does not bruise/bleed easily.  Psychiatric/Behavioral: Negative for dysphoric mood. The patient is not nervous/anxious.        Objective:    BP 131/77 mmHg  Pulse 56  Temp(Src) 97.8 F (36.6 C) (Oral)  Ht 5' 1.25" (1.556 m)  Wt 160 lb 4 oz (72.689 kg)  BMI 30.02 kg/m2  SpO2 97% Physical Exam  Constitutional: She is oriented to person, place, and time. She appears well-developed and well-nourished. No distress.  HENT:  Head: Normocephalic and atraumatic.  Right Ear: External ear normal.  Left Ear: External ear normal.  Nose: Nose normal.  Mouth/Throat: Oropharynx is clear and moist. No oropharyngeal exudate.  Eyes: Conjunctivae are normal. Pupils are equal, round, and reactive to light. Right eye exhibits no discharge. Left eye exhibits no discharge. No scleral  icterus.  Neck: Normal range of motion. Neck supple. No tracheal deviation present. No thyromegaly present.  Cardiovascular: Normal rate, regular rhythm, normal heart sounds and intact distal pulses.  Exam reveals no gallop and no friction rub.   No murmur heard. Pulmonary/Chest: Effort normal and breath sounds normal. No accessory muscle usage. No tachypnea. No respiratory distress. She has no decreased breath sounds. She has no wheezes. She has no rales. She exhibits no tenderness. Right breast exhibits no inverted nipple, no mass, no nipple discharge, no skin change and no tenderness. Left breast exhibits no inverted nipple, no mass, no nipple discharge, no skin change and no tenderness. Breasts are symmetrical.  Abdominal: Soft. Bowel sounds are normal. She exhibits no distension and no mass. There is no tenderness. There is no rebound and no guarding.  Musculoskeletal: Normal range of motion. She exhibits no edema or tenderness.  Lymphadenopathy:    She has no cervical adenopathy.  Neurological: She is alert and oriented to person, place, and time. No cranial nerve deficit. She exhibits normal muscle tone. Coordination normal.  Skin: Skin is warm and dry. No rash noted. She is not diaphoretic. No erythema. No pallor.  Psychiatric: She has a normal mood and affect. Her behavior is normal. Judgment and thought content normal.          Assessment & Plan:   Problem List Items Addressed This Visit      Unprioritized   Hypothyroidism    Will check TSH with labs. Continue Levoxyl.      Relevant Medications   LEVOXYL 75 MCG tablet   acebutolol (SECTRAL) capsule   Medicare annual wellness visit, subsequent - Primary    General medical exam including breast exam normal today except as noted. Pap and pelvic deferred as Pap completed 2012 normal and patient status post hysterectomy. Mammogram is up-to-date and was reviewed. Colonoscopy ordered. Immunizations are up-to-date. Encouraged healthy  diet and regular physical activity. Labs including CBC, CMP, lipid profile are pending.        Relevant Orders   TSH   CBC with Differential/Platelet   Comprehensive metabolic panel   Lipid panel   Vit D  25 hydroxy (rtn osteoporosis monitoring)   Screening for colon cancer   Relevant Orders   Ambulatory referral to Gastroenterology   Vitamin d deficiency    Will check Vit D with labs.          No Follow-up on file.

## 2014-10-15 ENCOUNTER — Other Ambulatory Visit: Payer: Self-pay | Admitting: *Deleted

## 2014-10-15 MED ORDER — ATORVASTATIN CALCIUM 10 MG PO TABS: 10.0000 mg | ORAL_TABLET | Freq: Every day | ORAL | Status: DC

## 2014-11-24 ENCOUNTER — Telehealth: Payer: Self-pay | Admitting: *Deleted

## 2014-11-24 NOTE — Telephone Encounter (Signed)
CMP and lipids for hyperlipidemia

## 2014-11-24 NOTE — Telephone Encounter (Signed)
Pt coming tomorrow what labs and dx?

## 2014-11-25 ENCOUNTER — Other Ambulatory Visit (INDEPENDENT_AMBULATORY_CARE_PROVIDER_SITE_OTHER): Payer: PPO

## 2014-11-25 DIAGNOSIS — E785 Hyperlipidemia, unspecified: Secondary | ICD-10-CM | POA: Diagnosis not present

## 2014-11-25 LAB — COMPREHENSIVE METABOLIC PANEL WITH GFR
ALT: 18 U/L (ref 0–35)
AST: 21 U/L (ref 0–37)
Albumin: 4 g/dL (ref 3.5–5.2)
Alkaline Phosphatase: 56 U/L (ref 39–117)
BUN: 33 mg/dL — ABNORMAL HIGH (ref 6–23)
CO2: 28 meq/L (ref 19–32)
Calcium: 10 mg/dL (ref 8.4–10.5)
Chloride: 105 meq/L (ref 96–112)
Creatinine, Ser: 1.18 mg/dL (ref 0.40–1.20)
GFR: 47.87 mL/min — ABNORMAL LOW
Glucose, Bld: 97 mg/dL (ref 70–99)
Potassium: 4.3 meq/L (ref 3.5–5.1)
Sodium: 138 meq/L (ref 135–145)
Total Bilirubin: 0.7 mg/dL (ref 0.2–1.2)
Total Protein: 6.6 g/dL (ref 6.0–8.3)

## 2014-11-25 LAB — LIPID PANEL
Cholesterol: 141 mg/dL (ref 0–200)
HDL: 43.1 mg/dL
LDL Cholesterol: 68 mg/dL (ref 0–99)
NonHDL: 97.9
Total CHOL/HDL Ratio: 3
Triglycerides: 149 mg/dL (ref 0.0–149.0)
VLDL: 29.8 mg/dL (ref 0.0–40.0)

## 2014-12-14 ENCOUNTER — Ambulatory Visit (INDEPENDENT_AMBULATORY_CARE_PROVIDER_SITE_OTHER): Payer: PPO | Admitting: Internal Medicine

## 2014-12-14 ENCOUNTER — Encounter: Payer: Self-pay | Admitting: Internal Medicine

## 2014-12-14 VITALS — BP 118/70 | HR 62 | Temp 97.9°F | Resp 14 | Ht 61.25 in | Wt 161.2 lb

## 2014-12-14 DIAGNOSIS — R609 Edema, unspecified: Secondary | ICD-10-CM | POA: Insufficient documentation

## 2014-12-14 DIAGNOSIS — L03115 Cellulitis of right lower limb: Secondary | ICD-10-CM | POA: Diagnosis not present

## 2014-12-14 MED ORDER — DOXYCYCLINE HYCLATE 100 MG PO TABS: 100.0000 mg | ORAL_TABLET | Freq: Two times a day (BID) | ORAL | Status: DC

## 2014-12-14 MED ORDER — GENTAMICIN SULFATE 0.1 % EX OINT: 1.0000 "application " | TOPICAL_OINTMENT | Freq: Three times a day (TID) | CUTANEOUS | Status: DC

## 2014-12-14 NOTE — Assessment & Plan Note (Signed)
Exam is consistent with early cellulitis after traumatic skin tear. Will start topical gentamicin and oral Doxycycline. Follow up recheck in 1 weeks.

## 2014-12-14 NOTE — Assessment & Plan Note (Signed)
Right lower leg swelling likely related to cellulitis, however will check lower extremity doppler for evaluation of DVT given persistence.

## 2014-12-14 NOTE — Patient Instructions (Signed)
Ultrasound right lower leg today.  Start Gentamicin topically 2-3 times daily.  Start Doxycycline twice daily. Avoid the sun on this medication.

## 2014-12-14 NOTE — Progress Notes (Signed)
   Subjective:    Patient ID: Gwendolyn King, female    DOB: 12-29-1940, 74 y.o.   MRN: [id number]  HPI  74YO female presents for acute visit.  Leg Wound - 9 days ago, hit right leg on car door. Since that time, has had problems with swelling in right lower leg and pain at site of skin tear. No pain with movement of right foot and knee. Ambulating without difficulty.    Past medical, surgical, family and social history per today's encounter.  Review of Systems  Constitutional: Negative for fever, chills, appetite change, fatigue and unexpected weight change.  Eyes: Negative for visual disturbance.  Respiratory: Negative for shortness of breath.   Cardiovascular: Positive for leg swelling. Negative for chest pain.  Gastrointestinal: Negative for abdominal pain.  Musculoskeletal: Positive for myalgias. Negative for arthralgias.  Skin: Positive for color change and wound. Negative for rash.  Hematological: Negative for adenopathy. Does not bruise/bleed easily.  Psychiatric/Behavioral: Negative for dysphoric mood. The patient is not nervous/anxious.        Objective:    BP 118/70 mmHg  Pulse 62  Temp(Src) 97.9 F (36.6 C) (Oral)  Resp 14  Ht 5' 1.25" (1.556 m)  Wt 161 lb 4 oz (73.143 kg)  BMI 30.21 kg/m2  SpO2 97% Physical Exam  Constitutional: She is oriented to person, place, and time. She appears well-developed and well-nourished. No distress.  HENT:  Head: Normocephalic and atraumatic.  Right Ear: External ear normal.  Left Ear: External ear normal.  Nose: Nose normal.  Mouth/Throat: Oropharynx is clear and moist.  Eyes: Conjunctivae are normal. Pupils are equal, round, and reactive to light. Right eye exhibits no discharge. Left eye exhibits no discharge. No scleral icterus.  Neck: Normal range of motion. Neck supple. No tracheal deviation present. No thyromegaly present.  Pulmonary/Chest: Effort normal. No accessory muscle usage. No tachypnea. She has no decreased  breath sounds. She has no rhonchi.  Musculoskeletal: Normal range of motion. She exhibits no edema or tenderness.  Lymphadenopathy:    She has no cervical adenopathy.  Neurological: She is alert and oriented to person, place, and time. No cranial nerve deficit. She exhibits normal muscle tone. Coordination normal.  Skin: Skin is warm and dry. No rash noted. She is not diaphoretic. There is erythema. No pallor.     Psychiatric: She has a normal mood and affect. Her behavior is normal. Judgment and thought content normal.          Assessment & Plan:   Problem List Items Addressed This Visit      Unprioritized   Cellulitis of right lower extremity - Primary    Exam is consistent with early cellulitis after traumatic skin tear. Will start topical gentamicin and oral Doxycycline. Follow up recheck in 1 weeks.      Relevant Medications   gentamicin ointment (GARAMYCIN) 0.1 %   doxycycline (VIBRA-TABS) 100 MG tablet   Edema    Right lower leg swelling likely related to cellulitis, however will check lower extremity doppler for evaluation of DVT given persistence.      Relevant Orders   US Venous Img Lower Unilateral Right       Return in about 1 week (around 12/21/2014) for Recheck.

## 2014-12-15 ENCOUNTER — Ambulatory Visit
Admit: 2014-12-15 | Disposition: A | Discharge status: Home / Self Care | Payer: Self-pay | Attending: Internal Medicine | Admitting: Internal Medicine

## 2015-01-21 ENCOUNTER — Other Ambulatory Visit: Payer: Self-pay | Admitting: Internal Medicine

## 2015-01-21 MED ORDER — ATORVASTATIN CALCIUM 10 MG PO TABS: 10.0000 mg | ORAL_TABLET | Freq: Every day | ORAL | Status: DC

## 2015-02-02 ENCOUNTER — Encounter: Payer: Self-pay | Admitting: Internal Medicine

## 2015-02-03 ENCOUNTER — Encounter: Payer: Self-pay | Admitting: Internal Medicine

## 2015-02-03 ENCOUNTER — Ambulatory Visit (INDEPENDENT_AMBULATORY_CARE_PROVIDER_SITE_OTHER): Payer: PPO | Admitting: Internal Medicine

## 2015-02-03 VITALS — BP 148/79 | HR 58 | Temp 97.8°F | Resp 14 | Ht 61.25 in | Wt 162.6 lb

## 2015-02-03 DIAGNOSIS — R002 Palpitations: Secondary | ICD-10-CM | POA: Insufficient documentation

## 2015-02-03 DIAGNOSIS — R609 Edema, unspecified: Secondary | ICD-10-CM | POA: Diagnosis not present

## 2015-02-03 LAB — CBC WITH DIFFERENTIAL/PLATELET
Basophils Absolute: 0 10*3/uL (ref 0.0–0.1)
Basophils Relative: 0.5 % (ref 0.0–3.0)
Eosinophils Absolute: 0.3 10*3/uL (ref 0.0–0.7)
Eosinophils Relative: 4.1 % (ref 0.0–5.0)
HCT: 43.9 % (ref 36.0–46.0)
Hemoglobin: 14.9 g/dL (ref 12.0–15.0)
Lymphocytes Relative: 38.9 % (ref 12.0–46.0)
Lymphs Abs: 3 10*3/uL (ref 0.7–4.0)
MCHC: 34 g/dL (ref 30.0–36.0)
MCV: 85.9 fl (ref 78.0–100.0)
Monocytes Absolute: 0.7 10*3/uL (ref 0.1–1.0)
Monocytes Relative: 8.5 % (ref 3.0–12.0)
Neutro Abs: 3.7 10*3/uL (ref 1.4–7.7)
Neutrophils Relative %: 48 % (ref 43.0–77.0)
Platelets: 143 10*3/uL — ABNORMAL LOW (ref 150.0–400.0)
RBC: 5.11 Mil/uL (ref 3.87–5.11)
RDW: 12.9 % (ref 11.5–15.5)
WBC: 7.7 10*3/uL (ref 4.0–10.5)

## 2015-02-03 LAB — COMPREHENSIVE METABOLIC PANEL WITH GFR
ALT: 21 U/L (ref 0–35)
AST: 22 U/L (ref 0–37)
Albumin: 4.4 g/dL (ref 3.5–5.2)
Alkaline Phosphatase: 59 U/L (ref 39–117)
BUN: 25 mg/dL — ABNORMAL HIGH (ref 6–23)
CO2: 30 meq/L (ref 19–32)
Calcium: 10.1 mg/dL (ref 8.4–10.5)
Chloride: 103 meq/L (ref 96–112)
Creatinine, Ser: 1.15 mg/dL (ref 0.40–1.20)
GFR: 49.29 mL/min — ABNORMAL LOW
Glucose, Bld: 94 mg/dL (ref 70–99)
Potassium: 4.4 meq/L (ref 3.5–5.1)
Sodium: 139 meq/L (ref 135–145)
Total Bilirubin: 0.6 mg/dL (ref 0.2–1.2)
Total Protein: 6.6 g/dL (ref 6.0–8.3)

## 2015-02-03 LAB — VITAMIN B12: Vitamin B-12: 398 pg/mL (ref 211–911)

## 2015-02-03 LAB — TSH: TSH: 1.1 u[IU]/mL (ref 0.35–4.50)

## 2015-02-03 NOTE — Progress Notes (Addendum)
Subjective:    Patient ID: Gwendolyn King, female    DOB: July 29, 1941, 74 y.o.   MRN: [id number]  HPI  74YO female presents for acute visit.  2-3 weeks ago ate a salty meal at [employer]. That night had some fluttering palpitations with skipped heart beats. Sat up part of night. No symptoms that morning. One other night had similar symptoms with palpitations. No chest pain or dyspnea. No diaphoresis. This happened in distant past. Put on Acebutolol by Dr. Andy Gauss in the past. Had stress test which was normal.  Also concerned about BLE edema around ankles. Worse at night, improved in morning. Compliant with Furosemide.  Past medical, surgical, family and social history per today's encounter.  Review of Systems  Constitutional: Negative for fever, chills, appetite change, fatigue and unexpected weight change.  Eyes: Negative for visual disturbance.  Respiratory: Negative for shortness of breath.   Cardiovascular: Positive for palpitations and leg swelling. Negative for chest pain.  Gastrointestinal: Negative for abdominal pain.  Skin: Negative for color change and rash.  Hematological: Negative for adenopathy. Does not bruise/bleed easily.  Psychiatric/Behavioral: Negative for dysphoric mood. The patient is not nervous/anxious.        Objective:    BP 148/79 mmHg  Pulse 58  Temp(Src) 97.8 F (36.6 C) (Oral)  Resp 14  Ht 5' 1.25" (1.556 m)  Wt 162 lb 9.6 oz (73.755 kg)  BMI 30.46 kg/m2  SpO2 99% Physical Exam  Constitutional: She is oriented to person, place, and time. She appears well-developed and well-nourished. No distress.  HENT:  Head: Normocephalic and atraumatic.  Right Ear: External ear normal.  Left Ear: External ear normal.  Nose: Nose normal.  Mouth/Throat: Oropharynx is clear and moist. No oropharyngeal exudate.  Eyes: Conjunctivae are normal. Pupils are equal, round, and reactive to light. Right eye exhibits no discharge. Left eye exhibits no discharge. No  scleral icterus.  Neck: Normal range of motion. Neck supple. No tracheal deviation present. No thyromegaly present.  Cardiovascular: Normal rate, regular rhythm, normal heart sounds and intact distal pulses.  Exam reveals no gallop and no friction rub.   No murmur heard. Pulmonary/Chest: Effort normal and breath sounds normal. No respiratory distress. She has no wheezes. She has no rales. She exhibits no tenderness.  Musculoskeletal: Normal range of motion. She exhibits edema (trace around ankles bilaterally). She exhibits no tenderness.  Lymphadenopathy:    She has no cervical adenopathy.  Neurological: She is alert and oriented to person, place, and time. No cranial nerve deficit. She exhibits normal muscle tone. Coordination normal.  Skin: Skin is warm and dry. No rash noted. She is not diaphoretic. No erythema. No pallor.  Psychiatric: She has a normal mood and affect. Her behavior is normal. Judgment and thought content normal.          Assessment & Plan:   Problem List Items Addressed This Visit      Unprioritized   Edema    Bilateral LE edema noted by pt. Reassured her today. Encouraged limited salt intake, use of compression stockings.       Palpitations - Primary    Recent episodes of palpitations concerning for AFIB. Will check CMP, CBC, TSH, B12 with labs. EKG today showed sinus bradycardia. Exam is normal today. Will set up cardiology evaluation for possible Holter. Consider changing Acebutolol to Carvedilol.      Relevant Orders   Electrocardiogram report   EKG 12-Lead (Completed)   CBC with Differential/Platelet (Completed)  Comprehensive metabolic panel (Completed)   TSH (Completed)   B12 (Completed)   Ambulatory referral to Cardiology       Return in about 4 weeks (around 03/03/2015) for Recheck.

## 2015-02-03 NOTE — Patient Instructions (Signed)
Labs today.  We will set up an evaluation with Dr. Rockey Situ for possible Holter Monitor.  Follow up here in 4 weeks or sooner as needed.

## 2015-02-03 NOTE — Assessment & Plan Note (Addendum)
Recent episodes of palpitations concerning for AFIB. Will check CMP, CBC, TSH, B12 with labs. EKG today showed sinus bradycardia. Exam is normal today. Will set up cardiology evaluation for possible Holter. Consider changing Acebutolol to Carvedilol.

## 2015-02-03 NOTE — Assessment & Plan Note (Signed)
Bilateral LE edema noted by pt. Reassured her today. Encouraged limited salt intake, use of compression stockings.

## 2015-03-04 ENCOUNTER — Encounter: Payer: Self-pay | Admitting: Internal Medicine

## 2015-03-26 ENCOUNTER — Ambulatory Visit (INDEPENDENT_AMBULATORY_CARE_PROVIDER_SITE_OTHER): Payer: PPO | Admitting: Cardiovascular Disease

## 2015-03-26 ENCOUNTER — Encounter: Payer: Self-pay | Admitting: Cardiovascular Disease

## 2015-03-26 VITALS — BP 142/64 | HR 67 | Ht 61.0 in | Wt 161.5 lb

## 2015-03-26 DIAGNOSIS — R609 Edema, unspecified: Secondary | ICD-10-CM

## 2015-03-26 DIAGNOSIS — R002 Palpitations: Secondary | ICD-10-CM

## 2015-03-26 DIAGNOSIS — I10 Essential (primary) hypertension: Secondary | ICD-10-CM | POA: Diagnosis not present

## 2015-03-26 DIAGNOSIS — E785 Hyperlipidemia, unspecified: Secondary | ICD-10-CM | POA: Diagnosis not present

## 2015-03-26 NOTE — Patient Instructions (Signed)
You are doing well.  Please take the sectral at dinner time If you continue to have palpitations,  Call the office We could order the holter  Please call us if you have new issues that need to be addressed before your next appt.  Your physician wants you to follow-up in: 1 month.

## 2015-03-26 NOTE — Progress Notes (Signed)
Patient ID: Gwendolyn King, female    DOB: 12-22-1940, 74 y.o.   MRN: [id number]  HPI Comments: Gwendolyn King is a very pleasant 74 year old woman with long history of palpitations, hyperlipidemia, leg edema treated with Lasix, who presents to establish care for her palpitations.  She reports that her symptoms started back in high school. She would have skipped beats that were present after exercising. Previously seen by Dr. Francoise Schaumann and started on acebutolol which helped her symptoms. She reports having palpitations in the evenings that started in the past month or so while she was trying to go to sleep. She feels the symptoms could be secondary to the Lipitor. She denies having significant symptoms in the daytime, otherwise is active with no complaints.  Symptoms of palpitations described as 4 to 5 fast and skipping beats in a row. Previously was associated with some shortness of breath, now this seems to have resolved. Overall symptoms seem to have improved somewhat. She describes the palpitations as someone lightly tapping the skin on her chest with her finger  Sometimes has problems with insomnia. Takes care of her elderly mother who is over 83 years old. This tends to cause some stress/anxiety for her.  EKG on today's visit shows normal sinus rhythm with rate 67 bpm, no significant ST or T-wave changes  Was previously on simvastatin and had myalgias   No Known Allergies  Current Outpatient Prescriptions on File Prior to Visit  Medication Sig Dispense Refill  . acebutolol (SECTRAL) 200 MG capsule Take 1 capsule (200 mg total) by mouth daily. 90 capsule 3  . Acetaminophen 500 MG coapsule     . aspirin 81 MG tablet Take 81 mg by mouth daily.    Marland Kitchen atorvastatin (LIPITOR) 10 MG tablet Take 1 tablet (10 mg total) by mouth daily. 90 tablet 3  . Biotin (BIOTIN 5000) 5 MG CAPS Take 1 capsule by mouth daily.    . Cholecalciferol (VITAMIN D-3) 1000 UNITS CAPS Take 1 capsule by mouth daily.    .  fexofenadine (ALLEGRA) 180 MG tablet Take 180 mg by mouth daily.    . furosemide (LASIX) 20 MG tablet Take 1 tablet (20 mg total) by mouth daily. 90 tablet 3  . LEVOXYL 75 MCG tablet TAKE 1 TABLET ONE TIME DAILY 90 tablet 3  . fluticasone (FLONASE) 50 MCG/ACT nasal spray Place 2 sprays into the nose daily. (Patient not taking: Reported on 02/03/2015) 16 g 1   No current facility-administered medications on file prior to visit.    Past Medical History  Diagnosis Date  . Chicken pox   . Hyperlipidemia   . Colon polyp   . Thyroid disease   . Asthma     as a child, dad was smoker  . Allergy     hay fever, fall allergies    Past Surgical History  Procedure Laterality Date  . Appendectomy  2012  . Tonsillectomy and adenoidectomy  1963  . Abdominal hysterectomy  1981  . Vaginal delivery      3    Social History  reports that she has never smoked. She does not have any smokeless tobacco history on file. She reports that she does not drink alcohol.  Family History family history includes Arthritis in her father and mother; Cancer in her maternal aunt; Diabetes in her other and paternal aunt; Heart disease in her father and mother; Hyperlipidemia in her maternal aunt and other; Hypertension in her father and mother; Stroke in her maternal  grandmother.   Review of Systems  Constitutional: Negative.   HENT: Negative.   Eyes: Negative.   Respiratory: Negative.   Cardiovascular: Positive for palpitations.  Gastrointestinal: Negative.   Endocrine: Negative.   Musculoskeletal: Negative.   Skin: Negative.   Allergic/Immunologic: Negative.   Neurological: Negative.   Hematological: Negative.   Psychiatric/Behavioral: Negative.   All other systems reviewed and are negative.   BP 142/64 mmHg  Pulse 67  Ht 5\' 1"  (1.549 m)  Wt 161 lb 8 oz (73.256 kg)  BMI 30.53 kg/m2   Physical Exam  Constitutional: She is oriented to person, place, and time. She appears well-developed and  well-nourished.  HENT:  Head: Normocephalic.  Nose: Nose normal.  Mouth/Throat: Oropharynx is clear and moist.  Eyes: Conjunctivae are normal. Pupils are equal, round, and reactive to light.  Neck: Normal range of motion. Neck supple. No JVD present.  Cardiovascular: Normal rate, regular rhythm, S1 normal, S2 normal, normal heart sounds and intact distal pulses.  Exam reveals no gallop and no friction rub.   No murmur heard. Pulmonary/Chest: Effort normal and breath sounds normal. No respiratory distress. She has no wheezes. She has no rales. She exhibits no tenderness.  Abdominal: Soft. Bowel sounds are normal. She exhibits no distension. There is no tenderness.  Musculoskeletal: Normal range of motion. She exhibits no edema or tenderness.  Lymphadenopathy:    She has no cervical adenopathy.  Neurological: She is alert and oriented to person, place, and time. Coordination normal.  Skin: Skin is warm and dry. No rash noted. No erythema.  Psychiatric: She has a normal mood and affect. Her behavior is normal. Judgment and thought content normal.    Assessment and Plan  Nursing note and vitals reviewed.

## 2015-03-26 NOTE — Assessment & Plan Note (Signed)
Previously was very symptomatic, now not particularly bad Recommended she take her beta blocker several hours earlier in preparation for the palpitations that she feels when she lays down to go to sleep If this does not help her symptoms, would order a 48-hour Holter monitor. Unclear if this could be exacerbated by Lipitor, less likely. Certainly she could hold this for several weeks to see if it makes any difference in her symptoms. If associated with Lipitor, could change to alternate statin such as generic Crestor. As symptoms are very mild, lasting only for several seconds, less likely atrial fibrillation, more likely APCs

## 2015-03-26 NOTE — Assessment & Plan Note (Signed)
No changes made to her medications Recommended she take her beta blocker at dinner time in preparation for the evening when she appreciates palpitations going to bed

## 2015-03-26 NOTE — Assessment & Plan Note (Signed)
Minimal edema, well controlled on Lasix when necessary. Likely component of venous insufficiency

## 2015-04-25 ENCOUNTER — Encounter: Payer: Self-pay | Admitting: Cardiovascular Disease

## 2015-04-28 ENCOUNTER — Telehealth: Payer: Self-pay

## 2015-04-28 NOTE — Telephone Encounter (Signed)
Pt is calling regarding her mychart message she sent on Monday. Pt states she received a my chart msg letting her know she had a message, but nothing was there. Please call.

## 2015-04-28 NOTE — Telephone Encounter (Signed)
Dr. Rockey Situ has not responded to her message yet.

## 2015-05-04 ENCOUNTER — Other Ambulatory Visit: Payer: Self-pay | Admitting: Cardiovascular Disease

## 2015-05-18 ENCOUNTER — Ambulatory Visit (INDEPENDENT_AMBULATORY_CARE_PROVIDER_SITE_OTHER): Payer: PPO | Admitting: Cardiovascular Disease

## 2015-05-18 ENCOUNTER — Encounter: Payer: Self-pay | Admitting: Cardiovascular Disease

## 2015-05-18 VITALS — BP 114/60 | HR 63 | Ht 61.5 in | Wt 162.0 lb

## 2015-05-18 DIAGNOSIS — R609 Edema, unspecified: Secondary | ICD-10-CM

## 2015-05-18 DIAGNOSIS — R002 Palpitations: Secondary | ICD-10-CM

## 2015-05-18 DIAGNOSIS — E785 Hyperlipidemia, unspecified: Secondary | ICD-10-CM | POA: Diagnosis not present

## 2015-05-18 DIAGNOSIS — I499 Cardiac arrhythmia, unspecified: Secondary | ICD-10-CM

## 2015-05-18 DIAGNOSIS — I10 Essential (primary) hypertension: Secondary | ICD-10-CM | POA: Diagnosis not present

## 2015-05-18 NOTE — Progress Notes (Signed)
Patient ID: Gwendolyn King, female    DOB: Oct 27, 1940, 74 y.o.   MRN: [id number]  HPI Comments: Gwendolyn King is a very pleasant 74 year old woman with long history of palpitations, hyperlipidemia, leg edema treated with Lasix as needed, who presents for follow-up of her palpitations  On her last clinic visit, we had suggested taking her beta blocker at dinner time as she was having palpitations at night. She continues to take her beta blocker right before bed. No symptoms of palpitations in the day, has rare symptomatic palpitations at night. Overall does not seem particularly bothered by this. Exercises 4 days per week, Lasix as needed for leg edema. In the past she was concerned Lipitor was causing her palpitations. She has continued on Lipitor 10 mg daily  Review of lab work June 2016 shows total cholesterol 150, LDL 69  EKG on today's visit shows normal sinus rhythm with rate 63, no significant ST or T-wave changes  Other past medical history Symptoms of palpitations described as 4 to 5 fast and skipping beats in a row. Previously was associated with some shortness of breath, now this seems to have resolved.  She describes the palpitations as someone lightly tapping the skin on her chest with her finger  Sometimes has problems with insomnia. Takes care of her elderly mother who is over 9 years old. This tends to cause some stress/anxiety for her.  Was previously on simvastatin and had myalgias   No Known Allergies  Current Outpatient Prescriptions on File Prior to Visit  Medication Sig Dispense Refill  . acebutolol (SECTRAL) 200 MG capsule Take 1 capsule (200 mg total) by mouth daily. 90 capsule 3  . Acetaminophen 500 MG coapsule     . aspirin 81 MG tablet Take 81 mg by mouth daily.    Marland Kitchen atorvastatin (LIPITOR) 10 MG tablet Take 1 tablet (10 mg total) by mouth daily. 90 tablet 3  . Biotin (BIOTIN 5000) 5 MG CAPS Take 1 capsule by mouth daily.    . Cholecalciferol (VITAMIN D-3) 1000  UNITS CAPS Take 1 capsule by mouth daily.    . fexofenadine (ALLEGRA) 180 MG tablet Take 180 mg by mouth daily.    . fluticasone (FLONASE) 50 MCG/ACT nasal spray Place 2 sprays into the nose daily. (Patient taking differently: Place 2 sprays into the nose as needed. ) 16 g 1  . furosemide (LASIX) 20 MG tablet Take 1 tablet (20 mg total) by mouth daily. 90 tablet 3  . LEVOXYL 75 MCG tablet TAKE 1 TABLET ONE TIME DAILY 90 tablet 3   No current facility-administered medications on file prior to visit.    Past Medical History  Diagnosis Date  . Chicken pox   . Hyperlipidemia   . Colon polyp   . Thyroid disease   . Asthma     as a child, dad was smoker  . Allergy     hay fever, fall allergies    Past Surgical History  Procedure Laterality Date  . Appendectomy  2012  . Tonsillectomy and adenoidectomy  1963  . Abdominal hysterectomy  1981  . Vaginal delivery      3    Social History  reports that she has never smoked. She does not have any smokeless tobacco history on file. She reports that she does not drink alcohol or use illicit drugs.  Family History family history includes Arthritis in her father and mother; Cancer in her maternal aunt; Diabetes in her other and paternal aunt;  Heart disease in her father and mother; Hyperlipidemia in her maternal aunt and other; Hypertension in her father and mother; Stroke in her maternal grandmother.   Review of Systems  Constitutional: Negative.   Respiratory: Negative.   Cardiovascular: Positive for palpitations.  Gastrointestinal: Negative.   Musculoskeletal: Negative.   Neurological: Negative.   Hematological: Negative.   Psychiatric/Behavioral: Negative.   All other systems reviewed and are negative.   BP 114/60 mmHg  Pulse 63  Ht 5' 1.5" (1.562 m)  Wt 162 lb (73.483 kg)  BMI 30.12 kg/m2   Physical Exam  Constitutional: She is oriented to person, place, and time. She appears well-developed and well-nourished.  HENT:   Head: Normocephalic.  Nose: Nose normal.  Mouth/Throat: Oropharynx is clear and moist.  Eyes: Conjunctivae are normal. Pupils are equal, round, and reactive to light.  Neck: Normal range of motion. Neck supple. No JVD present.  Cardiovascular: Normal rate, regular rhythm, S1 normal, S2 normal, normal heart sounds and intact distal pulses.  Exam reveals no gallop and no friction rub.   No murmur heard. Pulmonary/Chest: Effort normal and breath sounds normal. No respiratory distress. She has no wheezes. She has no rales. She exhibits no tenderness.  Abdominal: Soft. Bowel sounds are normal. She exhibits no distension. There is no tenderness.  Musculoskeletal: Normal range of motion. She exhibits no edema or tenderness.  Lymphadenopathy:    She has no cervical adenopathy.  Neurological: She is alert and oriented to person, place, and time. Coordination normal.  Skin: Skin is warm and dry. No rash noted. No erythema.  Psychiatric: She has a normal mood and affect. Her behavior is normal. Judgment and thought content normal.    Assessment and Plan  Nursing note and vitals reviewed.

## 2015-05-18 NOTE — Patient Instructions (Signed)
You are doing well. No medication changes were made.  Please call us if you have new issues that need to be addressed before your next appt.  Your physician wants you to follow-up in: 12 months.  You will receive a reminder letter in the mail two months in advance. If you don't receive a letter, please call our office to schedule the follow-up appointment. 

## 2015-05-18 NOTE — Assessment & Plan Note (Signed)
Cholesterol is at goal on the current lipid regimen. No changes to the medications were made.  

## 2015-05-18 NOTE — Assessment & Plan Note (Signed)
Blood pressure is well controlled on today's visit. No changes made to the medications. 

## 2015-05-18 NOTE — Assessment & Plan Note (Addendum)
Lasix when necessary. Likely secondary to venous insufficiency after a long day on her feet Compression hose if needed

## 2015-05-18 NOTE — Assessment & Plan Note (Signed)
Would continue current dose of beta blocker. Would take at dinnertime if symptoms are worse in the evening before bed If symptoms get worse, 48 hour Holter monitor could be ordered

## 2015-09-06 ENCOUNTER — Encounter: Payer: Self-pay | Admitting: Internal Medicine

## 2015-09-06 ENCOUNTER — Other Ambulatory Visit: Payer: Self-pay

## 2015-09-06 MED ORDER — ATORVASTATIN CALCIUM 10 MG PO TABS: 10.0000 mg | ORAL_TABLET | Freq: Every day | ORAL | Status: DC

## 2015-09-06 MED ORDER — FUROSEMIDE 20 MG PO TABS: 20.0000 mg | ORAL_TABLET | Freq: Every day | ORAL | Status: DC

## 2015-09-06 MED ORDER — LEVOXYL 75 MCG PO TABS: ORAL_TABLET | ORAL | Status: DC

## 2015-09-06 MED ORDER — ACEBUTOLOL HCL 200 MG PO CAPS: 200.0000 mg | ORAL_CAPSULE | Freq: Every day | ORAL | Status: DC

## 2015-11-04 ENCOUNTER — Telehealth: Payer: Self-pay | Admitting: *Deleted

## 2015-11-05 ENCOUNTER — Encounter: Payer: Self-pay | Admitting: Internal Medicine

## 2015-12-29 ENCOUNTER — Encounter: Payer: Self-pay | Admitting: Internal Medicine

## 2015-12-29 ENCOUNTER — Ambulatory Visit (INDEPENDENT_AMBULATORY_CARE_PROVIDER_SITE_OTHER): Payer: PPO | Admitting: Internal Medicine

## 2015-12-29 VITALS — BP 152/82 | HR 61 | Temp 97.9°F | Ht 61.0 in | Wt 161.5 lb

## 2015-12-29 DIAGNOSIS — Z0000 Encounter for general adult medical examination without abnormal findings: Secondary | ICD-10-CM | POA: Diagnosis not present

## 2015-12-29 DIAGNOSIS — I10 Essential (primary) hypertension: Secondary | ICD-10-CM | POA: Diagnosis not present

## 2015-12-29 DIAGNOSIS — E785 Hyperlipidemia, unspecified: Secondary | ICD-10-CM | POA: Diagnosis not present

## 2015-12-29 DIAGNOSIS — E039 Hypothyroidism, unspecified: Secondary | ICD-10-CM

## 2015-12-29 DIAGNOSIS — M1711 Unilateral primary osteoarthritis, right knee: Secondary | ICD-10-CM | POA: Diagnosis not present

## 2015-12-29 DIAGNOSIS — M1712 Unilateral primary osteoarthritis, left knee: Secondary | ICD-10-CM | POA: Diagnosis not present

## 2015-12-29 LAB — CBC WITH DIFFERENTIAL/PLATELET
Basophils Absolute: 0 10*3/uL (ref 0.0–0.1)
Basophils Relative: 0.5 % (ref 0.0–3.0)
Eosinophils Absolute: 0.2 10*3/uL (ref 0.0–0.7)
Eosinophils Relative: 4.2 % (ref 0.0–5.0)
HCT: 42.7 % (ref 36.0–46.0)
Hemoglobin: 14.8 g/dL (ref 12.0–15.0)
Lymphocytes Relative: 38.5 % (ref 12.0–46.0)
Lymphs Abs: 2.2 10*3/uL (ref 0.7–4.0)
MCHC: 34.5 g/dL (ref 30.0–36.0)
MCV: 85.5 fl (ref 78.0–100.0)
Monocytes Absolute: 0.6 10*3/uL (ref 0.1–1.0)
Monocytes Relative: 9.9 % (ref 3.0–12.0)
Neutro Abs: 2.7 10*3/uL (ref 1.4–7.7)
Neutrophils Relative %: 46.9 % (ref 43.0–77.0)
Platelets: 151 10*3/uL (ref 150.0–400.0)
RBC: 5 Mil/uL (ref 3.87–5.11)
RDW: 13.3 % (ref 11.5–15.5)
WBC: 5.7 10*3/uL (ref 4.0–10.5)

## 2015-12-29 LAB — COMPREHENSIVE METABOLIC PANEL WITH GFR
ALT: 19 U/L (ref 0–35)
AST: 21 U/L (ref 0–37)
Albumin: 4.2 g/dL (ref 3.5–5.2)
Alkaline Phosphatase: 50 U/L (ref 39–117)
BUN: 28 mg/dL — ABNORMAL HIGH (ref 6–23)
CO2: 29 meq/L (ref 19–32)
Calcium: 10 mg/dL (ref 8.4–10.5)
Chloride: 106 meq/L (ref 96–112)
Creatinine, Ser: 1.2 mg/dL (ref 0.40–1.20)
GFR: 46.81 mL/min — ABNORMAL LOW
Glucose, Bld: 87 mg/dL (ref 70–99)
Potassium: 4.4 meq/L (ref 3.5–5.1)
Sodium: 142 meq/L (ref 135–145)
Total Bilirubin: 0.7 mg/dL (ref 0.2–1.2)
Total Protein: 6.4 g/dL (ref 6.0–8.3)

## 2015-12-29 LAB — TSH: TSH: 1.76 u[IU]/mL (ref 0.35–4.50)

## 2015-12-29 LAB — MICROALBUMIN / CREATININE URINE RATIO
Creatinine,U: 147.6 mg/dL
Microalb Creat Ratio: 0.5 mg/g (ref 0.0–30.0)
Microalb, Ur: 0.8 mg/dL (ref 0.0–1.9)

## 2015-12-29 LAB — LIPID PANEL
Cholesterol: 208 mg/dL — ABNORMAL HIGH (ref 0–200)
HDL: 44.3 mg/dL
NonHDL: 163.61
Total CHOL/HDL Ratio: 5
Triglycerides: 204 mg/dL — ABNORMAL HIGH (ref 0.0–149.0)
VLDL: 40.8 mg/dL — ABNORMAL HIGH (ref 0.0–40.0)

## 2015-12-29 LAB — VITAMIN D 25 HYDROXY (VIT D DEFICIENCY, FRACTURES): VITD: 35.8 ng/mL (ref 30.00–100.00)

## 2015-12-29 LAB — LDL CHOLESTEROL, DIRECT: Direct LDL: 140 mg/dL

## 2015-12-29 MED ORDER — LEVOXYL 75 MCG PO TABS: ORAL_TABLET | ORAL | Status: DC

## 2015-12-29 MED ORDER — FUROSEMIDE 20 MG PO TABS: 20.0000 mg | ORAL_TABLET | Freq: Every day | ORAL | Status: DC

## 2015-12-29 MED ORDER — ACEBUTOLOL HCL 200 MG PO CAPS: 200.0000 mg | ORAL_CAPSULE | Freq: Every day | ORAL | Status: DC

## 2015-12-29 NOTE — Patient Instructions (Signed)
Health Maintenance, Female Adopting a healthy lifestyle and getting preventive care can go a long way to promote health and wellness. Talk with your health care provider about what schedule of regular examinations is right for you. This is a good chance for you to check in with your provider about disease prevention and staying healthy. In between checkups, there are plenty of things you can do on your own. Experts have done a lot of research about which lifestyle changes and preventive measures are most likely to keep you healthy. Ask your health care provider for more information. WEIGHT AND DIET  Eat a healthy diet  Be sure to include plenty of vegetables, fruits, low-fat dairy products, and lean protein.  Do not eat a lot of foods high in solid fats, added sugars, or salt.  Get regular exercise. This is one of the most important things you can do for your health.  Most adults should exercise for at least 150 minutes each week. The exercise should increase your heart rate and make you sweat (moderate-intensity exercise).  Most adults should also do strengthening exercises at least twice a week. This is in addition to the moderate-intensity exercise.  Maintain a healthy weight  Body mass index (BMI) is a measurement that can be used to identify possible weight problems. It estimates body fat based on height and weight. Your health care provider can help determine your BMI and help you achieve or maintain a healthy weight.  For females 39 years of age and older:   A BMI below 18.5 is considered underweight.  A BMI of 18.5 to 24.9 is normal.  A BMI of 25 to 29.9 is considered overweight.  A BMI of 30 and above is considered obese.  Watch levels of cholesterol and blood lipids  You should start having your blood tested for lipids and cholesterol at 73 years of age, then have this test every 5 years.  You may need to have your cholesterol levels checked more often if:  Your lipid  or cholesterol levels are high.  You are older than 73 years of age.  You are at high risk for heart disease.  CANCER SCREENING   Lung Cancer  Lung cancer screening is recommended for adults 94-86 years old who are at high risk for lung cancer because of a history of smoking.  A yearly low-dose CT scan of the lungs is recommended for people who:  Currently smoke.  Have quit within the past 15 years.  Have at least a 30-pack-year history of smoking. A pack year is smoking an average of one pack of cigarettes a day for 1 year.  Yearly screening should continue until it has been 15 years since you quit.  Yearly screening should stop if you develop a health problem that would prevent you from having lung cancer treatment.  Breast Cancer  Practice breast self-awareness. This means understanding how your breasts normally appear and feel.  It also means doing regular breast self-exams. Let your health care provider know about any changes, no matter how small.  If you are in your 20s or 30s, you should have a clinical breast exam (CBE) by a health care provider every 1-3 years as part of a regular health exam.  If you are 19 or older, have a CBE every year. Also consider having a breast X-ray (mammogram) every year.  If you have a family history of breast cancer, talk to your health care provider about genetic screening.  If you  are at high risk for breast cancer, talk to your health care provider about having an MRI and a mammogram every year.  Breast cancer gene (BRCA) assessment is recommended for women who have family members with BRCA-related cancers. BRCA-related cancers include:  Breast.  Ovarian.  Tubal.  Peritoneal cancers.  Results of the assessment will determine the need for genetic counseling and BRCA1 and BRCA2 testing. Cervical Cancer Your health care provider may recommend that you be screened regularly for cancer of the pelvic organs (ovaries, uterus, and  vagina). This screening involves a pelvic examination, including checking for microscopic changes to the surface of your cervix (Pap test). You may be encouraged to have this screening done every 3 years, beginning at age 21.  For women ages 30-65, health care providers may recommend pelvic exams and Pap testing every 3 years, or they may recommend the Pap and pelvic exam, combined with testing for human papilloma virus (HPV), every 5 years. Some types of HPV increase your risk of cervical cancer. Testing for HPV may also be done on women of any age with unclear Pap test results.  Other health care providers may not recommend any screening for nonpregnant women who are considered low risk for pelvic cancer and who do not have symptoms. Ask your health care provider if a screening pelvic exam is right for you.  If you have had past treatment for cervical cancer or a condition that could lead to cancer, you need Pap tests and screening for cancer for at least 20 years after your treatment. If Pap tests have been discontinued, your risk factors (such as having a new sexual partner) need to be reassessed to determine if screening should resume. Some women have medical problems that increase the chance of getting cervical cancer. In these cases, your health care provider may recommend more frequent screening and Pap tests. Colorectal Cancer  This type of cancer can be detected and often prevented.  Routine colorectal cancer screening usually begins at 73 years of age and continues through 73 years of age.  Your health care provider may recommend screening at an earlier age if you have risk factors for colon cancer.  Your health care provider may also recommend using home test kits to check for hidden blood in the stool.  A small camera at the end of a tube can be used to examine your colon directly (sigmoidoscopy or colonoscopy). This is done to check for the earliest forms of colorectal  cancer.  Routine screening usually begins at age 50.  Direct examination of the colon should be repeated every 5-10 years through 73 years of age. However, you may need to be screened more often if early forms of precancerous polyps or small growths are found. Skin Cancer  Check your skin from head to toe regularly.  Tell your health care provider about any new moles or changes in moles, especially if there is a change in a mole's shape or color.  Also tell your health care provider if you have a mole that is larger than the size of a pencil eraser.  Always use sunscreen. Apply sunscreen liberally and repeatedly throughout the day.  Protect yourself by wearing long sleeves, pants, a wide-brimmed hat, and sunglasses whenever you are outside. HEART DISEASE, DIABETES, AND HIGH BLOOD PRESSURE   High blood pressure causes heart disease and increases the risk of stroke. High blood pressure is more likely to develop in:  People who have blood pressure in the high end   of the normal range (130-139/85-89 mm Hg).  People who are overweight or obese.  People who are African American.  If you are 38-23 years of age, have your blood pressure checked every 3-5 years. If you are 61 years of age or older, have your blood pressure checked every year. You should have your blood pressure measured twice--once when you are at a hospital or clinic, and once when you are not at a hospital or clinic. Record the average of the two measurements. To check your blood pressure when you are not at a hospital or clinic, you can use:  An automated blood pressure machine at a pharmacy.  A home blood pressure monitor.  If you are between 45 years and 39 years old, ask your health care provider if you should take aspirin to prevent strokes.  Have regular diabetes screenings. This involves taking a blood sample to check your fasting blood sugar level.  If you are at a normal weight and have a low risk for diabetes,  have this test once every three years after 73 years of age.  If you are overweight and have a high risk for diabetes, consider being tested at a younger age or more often. PREVENTING INFECTION  Hepatitis B  If you have a higher risk for hepatitis B, you should be screened for this virus. You are considered at high risk for hepatitis B if:  You were born in a country where hepatitis B is common. Ask your health care provider which countries are considered high risk.  Your parents were born in a high-risk country, and you have not been immunized against hepatitis B (hepatitis B vaccine).  You have HIV or AIDS.  You use needles to inject street drugs.  You live with someone who has hepatitis B.  You have had sex with someone who has hepatitis B.  You get hemodialysis treatment.  You take certain medicines for conditions, including cancer, organ transplantation, and autoimmune conditions. Hepatitis C  Blood testing is recommended for:  Everyone born from [birth year] through 1963.  Anyone with known risk factors for hepatitis C. Sexually transmitted infections (STIs)  You should be screened for sexually transmitted infections (STIs) including gonorrhea and chlamydia if:  You are sexually active and are younger than 73 years of age.  You are older than 73 years of age and your health care provider tells you that you are at risk for this type of infection.  Your sexual activity has changed since you were last screened and you are at an increased risk for chlamydia or gonorrhea. Ask your health care provider if you are at risk.  If you do not have HIV, but are at risk, it may be recommended that you take a prescription medicine daily to prevent HIV infection. This is called pre-exposure prophylaxis (PrEP). You are considered at risk if:  You are sexually active and do not regularly use condoms or know the HIV status of your partner(s).  You take drugs by injection.  You are sexually  active with a partner who has HIV. Talk with your health care provider about whether you are at high risk of being infected with HIV. If you choose to begin PrEP, you should first be tested for HIV. You should then be tested every 3 months for as long as you are taking PrEP.  PREGNANCY   If you are premenopausal and you may become pregnant, ask your health care provider about preconception counseling.  If you may  become pregnant, take 400 to 800 micrograms (mcg) of folic acid every day.  If you want to prevent pregnancy, talk to your health care provider about birth control (contraception). OSTEOPOROSIS AND MENOPAUSE   Osteoporosis is a disease in which the bones lose minerals and strength with aging. This can result in serious bone fractures. Your risk for osteoporosis can be identified using a bone density scan.  If you are 30 years of age or older, or if you are at risk for osteoporosis and fractures, ask your health care provider if you should be screened.  Ask your health care provider whether you should take a calcium or vitamin D supplement to lower your risk for osteoporosis.  Menopause may have certain physical symptoms and risks.  Hormone replacement therapy may reduce some of these symptoms and risks. Talk to your health care provider about whether hormone replacement therapy is right for you.  HOME CARE INSTRUCTIONS   Schedule regular health, dental, and eye exams.  Stay current with your immunizations.   Do not use any tobacco products including cigarettes, chewing tobacco, or electronic cigarettes.  If you are pregnant, do not drink alcohol.  If you are breastfeeding, limit how much and how often you drink alcohol.  Limit alcohol intake to no more than 1 drink per day for nonpregnant women. One drink equals 12 ounces of beer, 5 ounces of wine, or 1 ounces of hard liquor.  Do not use street drugs.  Do not share needles.  Ask your health care provider for help if  you need support or information about quitting drugs.  Tell your health care provider if you often feel depressed.  Tell your health care provider if you have ever been abused or do not feel safe at home.   This information is not intended to replace advice given to you by your health care provider. Make sure you discuss any questions you have with your health care provider.   Document Released: 02/20/2011 Document Revised: 08/28/2014 Document Reviewed: 07/09/2013 Elsevier Interactive Patient Education [employer].

## 2015-12-29 NOTE — Assessment & Plan Note (Signed)
General medical exam normal today including breast exam. PAP and pelvic deferred given age and preference, also s/p hysterectomy. Labs today. Immunizations are UTD. Colonoscopy UTD. Encouraged healthy diet and exercise.

## 2015-12-29 NOTE — Progress Notes (Signed)
Subjective:    Patient ID: Gwendolyn King, female    DOB: Feb 25, 1941, 75 y.o.   MRN: [id number]  HPI  75YO female presents for physical exam.  Feeling well. Muscle pain and joint pain improved with stopping Lipitor.  Wt Readings from Last 3 Encounters:  12/29/15 161 lb 8 oz (73.256 kg)  05/18/15 162 lb (73.483 kg)  03/26/15 161 lb 8 oz (73.256 kg)   BP Readings from Last 3 Encounters:  12/29/15 152/82  05/18/15 114/60  03/26/15 142/64    Past Medical History  Diagnosis Date  . Chicken pox   . Hyperlipidemia   . Colon polyp   . Thyroid disease   . Asthma     as a child, dad was smoker  . Allergy     hay fever, fall allergies   Family History  Problem Relation Age of Onset  . Arthritis Mother   . Heart disease Mother   . Hypertension Mother   . Arthritis Father   . Heart disease Father   . Hypertension Father   . Stroke Maternal Grandmother   . Hyperlipidemia Other   . Diabetes Other   . Cancer Maternal Aunt     abdominal?  . Hyperlipidemia Maternal Aunt   . Diabetes Paternal Aunt    Past Surgical History  Procedure Laterality Date  . Appendectomy  2012  . Tonsillectomy and adenoidectomy  1963  . Abdominal hysterectomy  1981  . Vaginal delivery      3   Social History   Social History  . Marital Status: Married    Spouse Name: N/A  . Number of Children: N/A  . Years of Education: N/A   Social History Main Topics  . Smoking status: Never Smoker   . Smokeless tobacco: None  . Alcohol Use: No  . Drug Use: No  . Sexual Activity: Not Asked   Other Topics Concern  . None   Social History Narrative   Lives with husband in [place]. 3 children.      Work - retired, Pharmacist, community, Pharmacist, hospital      Diet - regular, limits sugar, eats small meals   Exercise - Jazzercise 4 days per week    Review of Systems  Constitutional: Negative for fever, chills, appetite change, fatigue and unexpected weight change.  HENT: Negative for congestion,  rhinorrhea and sinus pressure.   Eyes: Negative for visual disturbance.  Respiratory: Negative for shortness of breath.   Cardiovascular: Negative for chest pain and leg swelling.  Gastrointestinal: Negative for nausea, vomiting, abdominal pain, diarrhea, constipation and blood in stool.  Musculoskeletal: Positive for myalgias and arthralgias.  Skin: Negative for color change and rash.  Hematological: Negative for adenopathy. Does not bruise/bleed easily.  Psychiatric/Behavioral: Negative for sleep disturbance and dysphoric mood. The patient is not nervous/anxious.        Objective:    BP 152/82 mmHg  Pulse 61  Temp(Src) 97.9 F (36.6 C) (Oral)  Ht 5\' 1"  (1.549 m)  Wt 161 lb 8 oz (73.256 kg)  BMI 30.53 kg/m2  SpO2 99% Physical Exam  Constitutional: She is oriented to person, place, and time. She appears well-developed and well-nourished. No distress.  HENT:  Head: Normocephalic and atraumatic.  Right Ear: External ear normal.  Left Ear: External ear normal.  Nose: Nose normal.  Mouth/Throat: Oropharynx is clear and moist. No oropharyngeal exudate.  Eyes: Conjunctivae are normal. Pupils are equal, round, and reactive to light. Right eye exhibits no discharge. Left eye  exhibits no discharge. No scleral icterus.  Neck: Normal range of motion. Neck supple. No tracheal deviation present. No thyromegaly present.  Cardiovascular: Normal rate, regular rhythm, normal heart sounds and intact distal pulses.  Exam reveals no gallop and no friction rub.   No murmur heard. Pulmonary/Chest: Effort normal and breath sounds normal. No accessory muscle usage. No tachypnea. No respiratory distress. She has no decreased breath sounds. She has no wheezes. She has no rales. She exhibits no tenderness. Right breast exhibits no inverted nipple, no mass, no nipple discharge, no skin change and no tenderness. Left breast exhibits no inverted nipple, no mass, no nipple discharge, no skin change and no  tenderness. Breasts are symmetrical.  Abdominal: Soft. Bowel sounds are normal. She exhibits no distension and no mass. There is no tenderness. There is no rebound and no guarding.  Musculoskeletal: Normal range of motion. She exhibits no edema or tenderness.  Lymphadenopathy:    She has no cervical adenopathy.  Neurological: She is alert and oriented to person, place, and time. No cranial nerve deficit. She exhibits normal muscle tone. Coordination normal.  Skin: Skin is warm and dry. No rash noted. She is not diaphoretic. No erythema. No pallor.  Psychiatric: She has a normal mood and affect. Her behavior is normal. Judgment and thought content normal.          Assessment & Plan:   Problem List Items Addressed This Visit      Unprioritized   Hyperlipidemia   Relevant Medications   acebutolol (SECTRAL) 200 MG capsule   furosemide (LASIX) 20 MG tablet   Hypertension   Relevant Medications   acebutolol (SECTRAL) 200 MG capsule   furosemide (LASIX) 20 MG tablet   Hypothyroidism   Relevant Medications   acebutolol (SECTRAL) 200 MG capsule   LEVOXYL 75 MCG tablet   Routine general medical examination at a health care facility - Primary    General medical exam normal today including breast exam. PAP and pelvic deferred given age and preference, also s/p hysterectomy. Labs today. Immunizations are UTD. Colonoscopy UTD. Encouraged healthy diet and exercise.      Relevant Orders   CBC with Differential/Platelet   Comprehensive metabolic panel   Lipid panel   Microalbumin / creatinine urine ratio   VITAMIN D 25 Hydroxy (Vit-D Deficiency, Fractures)   TSH   MM Digital Screening       Return in about 6 months (around 06/30/2016) for Recheck.  Ronette Deter, MD Internal [facility]

## 2015-12-29 NOTE — Progress Notes (Signed)
Pre visit review using our clinic review tool, if applicable. No additional management support is needed unless otherwise documented below in the visit note. 

## 2016-01-15 ENCOUNTER — Encounter: Payer: Self-pay | Admitting: Internal Medicine

## 2016-01-27 ENCOUNTER — Ambulatory Visit: Payer: PPO

## 2016-02-08 ENCOUNTER — Ambulatory Visit
Admission: RE | Admit: 2016-02-08 | Discharge: 2016-02-08 | Disposition: A | Discharge status: Home / Self Care | Payer: PPO | Source: Ambulatory Visit | Attending: Internal Medicine | Admitting: Internal Medicine

## 2016-02-08 ENCOUNTER — Other Ambulatory Visit: Payer: Self-pay | Admitting: Internal Medicine

## 2016-02-08 DIAGNOSIS — Z0000 Encounter for general adult medical examination without abnormal findings: Secondary | ICD-10-CM

## 2016-02-08 DIAGNOSIS — Z1231 Encounter for screening mammogram for malignant neoplasm of breast: Secondary | ICD-10-CM | POA: Insufficient documentation

## 2016-02-16 ENCOUNTER — Other Ambulatory Visit: Payer: Self-pay | Admitting: Internal Medicine

## 2016-02-16 DIAGNOSIS — R928 Other abnormal and inconclusive findings on diagnostic imaging of breast: Secondary | ICD-10-CM

## 2016-03-13 ENCOUNTER — Ambulatory Visit
Admission: RE | Admit: 2016-03-13 | Discharge: 2016-03-13 | Disposition: A | Discharge status: Home / Self Care | Payer: PPO | Source: Ambulatory Visit | Attending: Internal Medicine | Admitting: Internal Medicine

## 2016-03-13 DIAGNOSIS — R928 Other abnormal and inconclusive findings on diagnostic imaging of breast: Secondary | ICD-10-CM

## 2016-03-13 DIAGNOSIS — N6489 Other specified disorders of breast: Secondary | ICD-10-CM | POA: Diagnosis not present

## 2016-03-17 ENCOUNTER — Encounter: Payer: Self-pay | Admitting: Internal Medicine

## 2016-03-20 ENCOUNTER — Telehealth: Payer: Self-pay

## 2016-03-20 DIAGNOSIS — E785 Hyperlipidemia, unspecified: Secondary | ICD-10-CM

## 2016-03-20 NOTE — Telephone Encounter (Signed)
Pt coming for labs 03/21/16. Please place future orders. Thank you.

## 2016-03-21 ENCOUNTER — Other Ambulatory Visit (INDEPENDENT_AMBULATORY_CARE_PROVIDER_SITE_OTHER): Payer: PPO

## 2016-03-21 DIAGNOSIS — E785 Hyperlipidemia, unspecified: Secondary | ICD-10-CM | POA: Diagnosis not present

## 2016-03-21 LAB — LIPID PANEL
Cholesterol: 182 mg/dL (ref 0–200)
HDL: 44.5 mg/dL
LDL Cholesterol: 107 mg/dL — ABNORMAL HIGH (ref 0–99)
NonHDL: 137.79
Total CHOL/HDL Ratio: 4
Triglycerides: 154 mg/dL — ABNORMAL HIGH (ref 0.0–149.0)
VLDL: 30.8 mg/dL (ref 0.0–40.0)

## 2016-03-21 LAB — COMPREHENSIVE METABOLIC PANEL WITH GFR
ALT: 15 U/L (ref 0–35)
AST: 16 U/L (ref 0–37)
Albumin: 4.2 g/dL (ref 3.5–5.2)
Alkaline Phosphatase: 55 U/L (ref 39–117)
BUN: 26 mg/dL — ABNORMAL HIGH (ref 6–23)
CO2: 28 meq/L (ref 19–32)
Calcium: 9.8 mg/dL (ref 8.4–10.5)
Chloride: 106 meq/L (ref 96–112)
Creatinine, Ser: 1.07 mg/dL (ref 0.40–1.20)
GFR: 53.4 mL/min — ABNORMAL LOW
Glucose, Bld: 97 mg/dL (ref 70–99)
Potassium: 3.9 meq/L (ref 3.5–5.1)
Sodium: 141 meq/L (ref 135–145)
Total Bilirubin: 0.8 mg/dL (ref 0.2–1.2)
Total Protein: 6.5 g/dL (ref 6.0–8.3)

## 2016-03-23 ENCOUNTER — Encounter: Payer: Self-pay | Admitting: Internal Medicine

## 2016-03-23 ENCOUNTER — Ambulatory Visit (INDEPENDENT_AMBULATORY_CARE_PROVIDER_SITE_OTHER): Payer: PPO | Admitting: Internal Medicine

## 2016-03-23 VITALS — BP 120/54 | HR 55 | Temp 97.8°F | Ht 61.0 in | Wt 157.6 lb

## 2016-03-23 DIAGNOSIS — Z780 Asymptomatic menopausal state: Secondary | ICD-10-CM

## 2016-03-23 DIAGNOSIS — E785 Hyperlipidemia, unspecified: Secondary | ICD-10-CM

## 2016-03-23 NOTE — Assessment & Plan Note (Signed)
Lipids well controlled off medication. Will monitor for now. Continue healthy diet and exercise.

## 2016-03-23 NOTE — Patient Instructions (Signed)
Continue current medications. 

## 2016-03-23 NOTE — Progress Notes (Signed)
Subjective:    Patient ID: Gwendolyn King, female    DOB: 03-05-1941, 75 y.o.   MRN: [id number]  HPI  75YO female presents for follow up.  HL - Not taking Rosuvastatin. Lipids well controlled off medication.  Feeling well. No other concrns today.  Wt Readings from Last 3 Encounters:  03/23/16 157 lb 9.6 oz (71.5 kg)  12/29/15 161 lb 8 oz (73.3 kg)  05/18/15 162 lb (73.5 kg)   BP Readings from Last 3 Encounters:  03/23/16 (!) 120/54  12/29/15 (!) 152/82  05/18/15 114/60    Past Medical History:  Diagnosis Date  . Allergy    hay fever, fall allergies  . Asthma    as a child, dad was smoker  . Chicken pox   . Colon polyp   . Hyperlipidemia   . Thyroid disease    Family History  Problem Relation Age of Onset  . Arthritis Mother   . Heart disease Mother   . Hypertension Mother   . Arthritis Father   . Heart disease Father   . Hypertension Father   . Stroke Maternal Grandmother   . Hyperlipidemia Other   . Diabetes Other   . Cancer Maternal Aunt     abdominal?  . Hyperlipidemia Maternal Aunt   . Diabetes Paternal Aunt   . Breast cancer Neg Hx    Past Surgical History:  Procedure Laterality Date  . ABDOMINAL HYSTERECTOMY  1981  . APPENDECTOMY  2012  . TONSILLECTOMY AND ADENOIDECTOMY  1963  . VAGINAL DELIVERY     3   Social History   Social History  . Marital status: Married    Spouse name: N/A  . Number of children: N/A  . Years of education: N/A   Social History Main Topics  . Smoking status: Never Smoker  . Smokeless tobacco: None  . Alcohol use No  . Drug use: No  . Sexual activity: Not Asked   Other Topics Concern  . None   Social History Narrative   Lives with husband in [place]. 3 children.      Work - retired, Pharmacist, community, Pharmacist, hospital      Diet - regular, limits sugar, eats small meals   Exercise - Jazzercise 4 days per week    Review of Systems  Constitutional: Negative for appetite change, chills, fatigue, fever and  unexpected weight change.  Eyes: Negative for visual disturbance.  Respiratory: Negative for shortness of breath.   Cardiovascular: Negative for chest pain and leg swelling.  Gastrointestinal: Negative for abdominal pain, constipation, diarrhea and vomiting.  Musculoskeletal: Negative for arthralgias and myalgias.  Skin: Negative for color change and rash.  Hematological: Negative for adenopathy. Does not bruise/bleed easily.  Psychiatric/Behavioral: Negative for dysphoric mood, sleep disturbance and suicidal ideas. The patient is not nervous/anxious.        Objective:    BP (!) 120/54 (BP Location: Left Arm, Patient Position: Sitting, Cuff Size: Large)   Pulse (!) 55   Temp 97.8 F (36.6 C) (Oral)   Ht 5\' 1"  (1.549 m)   Wt 157 lb 9.6 oz (71.5 kg)   SpO2 98%   BMI 29.78 kg/m  Physical Exam  Constitutional: She is oriented to person, place, and time. She appears well-developed and well-nourished. No distress.  HENT:  Head: Normocephalic and atraumatic.  Right Ear: External ear normal.  Left Ear: External ear normal.  Nose: Nose normal.  Mouth/Throat: Oropharynx is clear and moist. No oropharyngeal exudate.  Eyes: Conjunctivae  are normal. Pupils are equal, round, and reactive to light. Right eye exhibits no discharge. Left eye exhibits no discharge. No scleral icterus.  Neck: Normal range of motion. Neck supple. No tracheal deviation present. No thyromegaly present.  Cardiovascular: Normal rate, regular rhythm, normal heart sounds and intact distal pulses.  Exam reveals no gallop and no friction rub.   No murmur heard. Pulmonary/Chest: Effort normal and breath sounds normal. No respiratory distress. She has no wheezes. She has no rales. She exhibits no tenderness.  Musculoskeletal: Normal range of motion. She exhibits no edema or tenderness.  Lymphadenopathy:    She has no cervical adenopathy.  Neurological: She is alert and oriented to person, place, and time. No cranial nerve  deficit. She exhibits normal muscle tone. Coordination normal.  Skin: Skin is warm and dry. No rash noted. She is not diaphoretic. No erythema. No pallor.  Psychiatric: She has a normal mood and affect. Her behavior is normal. Judgment and thought content normal.          Assessment & Plan:   Problem List Items Addressed This Visit      Unprioritized   Hyperlipidemia - Primary    Lipids well controlled off medication. Will monitor for now. Continue healthy diet and exercise.       Other Visit Diagnoses    Postmenopausal estrogen deficiency       Relevant Orders   DG Bone Density       Return in about 4 weeks (around 04/20/2016) for New Patient.  Ronette Deter, MD Internal Medicine [facility]

## 2016-04-05 DIAGNOSIS — H2513 Age-related nuclear cataract, bilateral: Secondary | ICD-10-CM | POA: Diagnosis not present

## 2016-04-25 ENCOUNTER — Encounter: Payer: Self-pay | Admitting: Internal Medicine

## 2016-05-17 ENCOUNTER — Ambulatory Visit: Payer: PPO | Admitting: Family Medicine

## 2016-05-17 ENCOUNTER — Ambulatory Visit
Admission: RE | Admit: 2016-05-17 | Discharge: 2016-05-17 | Disposition: A | Discharge status: Home / Self Care | Payer: PPO | Source: Ambulatory Visit | Attending: Internal Medicine | Admitting: Internal Medicine

## 2016-05-17 DIAGNOSIS — M85861 Other specified disorders of bone density and structure, right lower leg: Secondary | ICD-10-CM | POA: Diagnosis not present

## 2016-05-17 DIAGNOSIS — Z780 Asymptomatic menopausal state: Secondary | ICD-10-CM

## 2016-05-17 DIAGNOSIS — M8580 Other specified disorders of bone density and structure, unspecified site: Secondary | ICD-10-CM | POA: Insufficient documentation

## 2016-05-17 DIAGNOSIS — Z13820 Encounter for screening for osteoporosis: Secondary | ICD-10-CM | POA: Insufficient documentation

## 2016-05-17 DIAGNOSIS — M85832 Other specified disorders of bone density and structure, left forearm: Secondary | ICD-10-CM | POA: Diagnosis not present

## 2016-05-18 ENCOUNTER — Ambulatory Visit (INDEPENDENT_AMBULATORY_CARE_PROVIDER_SITE_OTHER): Payer: PPO | Admitting: Cardiovascular Disease

## 2016-05-18 ENCOUNTER — Encounter: Payer: Self-pay | Admitting: Cardiovascular Disease

## 2016-05-18 VITALS — BP 120/58 | HR 71 | Ht 61.0 in | Wt 157.8 lb

## 2016-05-18 DIAGNOSIS — E785 Hyperlipidemia, unspecified: Secondary | ICD-10-CM

## 2016-05-18 DIAGNOSIS — E669 Obesity, unspecified: Secondary | ICD-10-CM | POA: Insufficient documentation

## 2016-05-18 DIAGNOSIS — I10 Essential (primary) hypertension: Secondary | ICD-10-CM | POA: Diagnosis not present

## 2016-05-18 DIAGNOSIS — R002 Palpitations: Secondary | ICD-10-CM

## 2016-05-18 NOTE — Patient Instructions (Signed)

## 2016-05-18 NOTE — Progress Notes (Signed)
Cardiology Office Note  Date:  05/18/2016   ID:  Gwendolyn King, Gwendolyn King May 31, 1941, MRN [id number]  PCP:  Coral Spikes, DO   Chief Complaint  Patient presents with  . other    1 yr f/u no complaints today. Meds reviewed verbally with pt.    HPI:  Gwendolyn King is a very pleasant 75 year old woman with long history of palpitations, hyperlipidemia, leg edema treated with Lasix as needed, who presents for follow-up of her palpitations  In follow-up, she reports that she is doing well with no complaints Palpitations better off lipitor, continues to take her beta blocker  Lab work reviewed with her Total chol 208, repeat 182 LDL 107  Remains active, Still doing exercise  "jazzercise"  several days per week Goes for a biscuit after she is done exercising  Uses lasix QOD typically for leg edema which has been stable    EKG on today's visit shows normal sinus rhythm with rate 71 bpm, no significant ST or T-wave changes  Other past medical history Symptoms of palpitations described as 4 to 5 fast and skipping beats in a row. Previously was associated with some shortness of breath, now this seems to have resolved.  She describes the palpitations as someone lightly tapping the skin on her chest with her finger  Sometimes has problems with insomnia. Takes care of her elderly mother who is over 66 years old. This tends to cause some stress/anxiety for her.  Was previously on simvastatin and had myalgias   PMH:   has a past medical history of Allergy; Asthma; Chicken pox; Colon polyp; Hyperlipidemia; and Thyroid disease.  PSH:    Past Surgical History:  Procedure Laterality Date  . ABDOMINAL HYSTERECTOMY  1981  . APPENDECTOMY  2012  . TONSILLECTOMY AND ADENOIDECTOMY  1963  . VAGINAL DELIVERY     3    Current Outpatient Prescriptions  Medication Sig Dispense Refill  . acebutolol (SECTRAL) 200 MG capsule Take 1 capsule (200 mg total) by mouth daily. 90 capsule 3  . Acetaminophen 500  MG coapsule     . aspirin 81 MG tablet Take 81 mg by mouth daily.    . Biotin (BIOTIN 5000) 5 MG CAPS Take 1 capsule by mouth daily.    . Cholecalciferol (VITAMIN D-3) 1000 UNITS CAPS Take 1 capsule by mouth daily.    . fexofenadine (ALLEGRA) 180 MG tablet Take 180 mg by mouth daily.    . furosemide (LASIX) 20 MG tablet Take 1 tablet (20 mg total) by mouth daily. prn 90 tablet 3  . KRILL OIL PO Take by mouth daily.    Marland Kitchen LEVOXYL 75 MCG tablet TAKE 1 TABLET ONE TIME DAILY 90 tablet 3   No current facility-administered medications for this visit.      Allergies:   Lipitor [atorvastatin] (palpitations) and Simvastatin   Social History:  The patient  reports that she has never smoked. She has never used smokeless tobacco. She reports that she does not drink alcohol or use drugs.   Family History:   family history includes Arthritis in her father and mother; Cancer in her maternal aunt; Diabetes in her other and paternal aunt; Heart disease in her father and mother; Hyperlipidemia in her maternal aunt and other; Hypertension in her father and mother; Stroke in her maternal grandmother.    Review of Systems: Review of Systems  Constitutional: Negative.   Respiratory: Negative.   Cardiovascular: Negative.   Gastrointestinal: Negative.   Musculoskeletal: Negative.  Neurological: Negative.   Psychiatric/Behavioral: Negative.   All other systems reviewed and are negative.    PHYSICAL EXAM: VS:  BP (!) 120/58 (BP Location: Left Arm, Patient Position: Sitting, Cuff Size: Normal)   Pulse 71   Ht 5\' 1"  (1.549 m)   Wt 71.6 kg (157 lb 12 oz)   BMI 29.81 kg/m  , BMI Body mass index is 29.81 kg/m. GEN: Well nourished, well developed, in no acute distress  HEENT: normal  Neck: no JVD, carotid bruits, or masses Cardiac: RRR; no murmurs, rubs, or gallops,no edema  Respiratory:  clear to auscultation bilaterally, normal work of breathing GI: soft, nontender, nondistended, + BS MS: no  deformity or atrophy  Skin: warm and dry, no rash Neuro:  Strength and sensation are intact Psych: euthymic mood, full affect    Recent Labs: 12/29/2015: Hemoglobin 14.8; Platelets 151.0; TSH 1.76 03/21/2016: ALT 15; BUN 26; Creatinine, Ser 1.07; Potassium 3.9; Sodium 141    Lipid Panel Lab Results  Component Value Date   CHOL 182 03/21/2016   HDL 44.50 03/21/2016   LDLCALC 107 (H) 03/21/2016   TRIG 154.0 (H) 03/21/2016      Wt Readings from Last 3 Encounters:  05/18/16 71.6 kg (157 lb 12 oz)  03/23/16 71.5 kg (157 lb 9.6 oz)  12/29/15 73.3 kg (161 lb 8 oz)       ASSESSMENT AND PLAN:  Essential hypertension - Plan: EKG 12-Lead Blood pressure is well controlled on today's visit. No changes made to the medications.  Palpitations - Plan: EKG 12-Lead She reports symptoms resolved by holding Lipitor We'll stay on her beta blocker  Hyperlipidemia Down to 180, recommended continued weight loss, exercise  Obesity We have encouraged continued exercise, careful diet management in an effort to lose weight.   Total encounter time more than 15 minutes  Greater than 50% was spent in counseling and coordination of care with the patient    Disposition:   F/U  12 months  Orders Placed This Encounter  Procedures  . EKG 12-Lead     Signed, Esmond Plants, M.D., Ph.D. 05/18/2016  [place], [place]

## 2016-05-22 ENCOUNTER — Encounter: Payer: Self-pay | Admitting: Family Medicine

## 2016-05-22 ENCOUNTER — Ambulatory Visit (INDEPENDENT_AMBULATORY_CARE_PROVIDER_SITE_OTHER): Payer: PPO | Admitting: Family Medicine

## 2016-05-22 DIAGNOSIS — E039 Hypothyroidism, unspecified: Secondary | ICD-10-CM | POA: Diagnosis not present

## 2016-05-22 DIAGNOSIS — Z23 Encounter for immunization: Secondary | ICD-10-CM

## 2016-05-22 DIAGNOSIS — E785 Hyperlipidemia, unspecified: Secondary | ICD-10-CM | POA: Diagnosis not present

## 2016-05-22 DIAGNOSIS — G8929 Other chronic pain: Secondary | ICD-10-CM

## 2016-05-22 DIAGNOSIS — I10 Essential (primary) hypertension: Secondary | ICD-10-CM

## 2016-05-22 DIAGNOSIS — M25561 Pain in right knee: Secondary | ICD-10-CM | POA: Insufficient documentation

## 2016-05-22 NOTE — Patient Instructions (Signed)
Follow up with your Orthopedist.  Continue your meds.  Take care  F/U in 6 months.

## 2016-05-22 NOTE — Progress Notes (Signed)
Subjective:  Patient ID: Gwendolyn King, female    DOB: 1940-09-11  Age: 75 y.o. MRN: [id number]  CC: Follow up, R knee pain  HPI:  75 year old female with hypothyroidism, HTN, HLD presents for follow up. Patient has complaints of R knee pain.  HTN  Stable on PRN Lasix and Acebutolol.  HLD  Most recent LDL was 107 in August.  Not on treatment.  Has not tolerated Statins in the past.  Hypothyroidism  Stable on current dose of Synthroid.  R knee pain  Chronic.  Worsening over the past several weeks.   She reports associated R hip pain and low back pain.  She has seen Ortho previously and was told she had severe OA.  Pain is moderate and intermittent.  Worse at night.  She would like to discuss this today.  Social Hx   Social History   Social History  . Marital status: Married    Spouse name: N/A  . Number of children: N/A  . Years of education: N/A   Social History Main Topics  . Smoking status: Never Smoker  . Smokeless tobacco: Never Used  . Alcohol use No  . Drug use: No  . Sexual activity: Not Asked   Other Topics Concern  . None   Social History Narrative   Lives with husband in [place]. 3 children.      Work - retired, Pharmacist, community, Pharmacist, hospital      Diet - regular, limits sugar, eats small meals   Exercise - Jazzercise 4 days per week   Review of Systems  Constitutional: Negative.   Musculoskeletal:       Right knee pain.   Objective:  BP 136/64 (BP Location: Right Arm, Patient Position: Sitting, Cuff Size: Normal)   Pulse 71   Temp 98.2 F (36.8 C) (Oral)   Wt 158 lb 2 oz (71.7 kg)   SpO2 99%   BMI 29.88 kg/m   BP/Weight 05/22/2016 [id number]  Systolic BP [id number] [id number]  Diastolic BP 64 58 54  Wt. (Lbs) 158.13 157.75 157.6  BMI 29.88 29.81 29.78   Physical Exam  Constitutional: She is oriented to person, place, and time. She appears well-developed. No distress.  Pulmonary/Chest: Effort normal.  Musculoskeletal:    Knee: Inspection - Genu valgum noted. No erythema or effusion. Palpation normal with no warmth, joint line tenderness, patellar tenderness, or condyle tenderness. Ligaments with solid consistent endpoints including ACL, PCL, LCL, MCL.   Neurological: She is alert and oriented to person, place, and time.  Psychiatric: She has a normal mood and affect.  Vitals reviewed.   Lab Results  Component Value Date   WBC 5.7 12/29/2015   HGB 14.8 12/29/2015   HCT 42.7 12/29/2015   PLT 151.0 12/29/2015   GLUCOSE 97 03/21/2016   CHOL 182 03/21/2016   TRIG 154.0 (H) 03/21/2016   HDL 44.50 03/21/2016   LDLDIRECT 140.0 12/29/2015   LDLCALC 107 (H) 03/21/2016   ALT 15 03/21/2016   AST 16 03/21/2016   NA 141 03/21/2016   K 3.9 03/21/2016   CL 106 03/21/2016   CREATININE 1.07 03/21/2016   BUN 26 (H) 03/21/2016   CO2 28 03/21/2016   TSH 1.76 12/29/2015   MICROALBUR 0.8 12/29/2015    Assessment & Plan:   Problem List Items Addressed This Visit    Hypothyroidism    Stable. Continue current Synthroid dose.      Hypertension    Stable. Continue Acebutolol and PRN  Lasix.      Hyperlipidemia    Not at goal.  ASCVD 10 year risk score 19.1%. Patient declines treatment with statin. Will continue to monitor.       Right knee pain    Secondary to underlying OA. Advised to follow up with Ortho.       Other Visit Diagnoses    Encounter for immunization       Relevant Orders   Flu vaccine HIGH DOSE PF (Completed)     Follow-up: Return in about 6 months (around 11/20/2016).  [place]

## 2016-05-22 NOTE — Progress Notes (Signed)
Pre visit review using our clinic review tool, if applicable. No additional management support is needed unless otherwise documented below in the visit note. 

## 2016-05-22 NOTE — Assessment & Plan Note (Signed)
Secondary to underlying OA. Advised to follow up with Ortho.

## 2016-05-22 NOTE — Assessment & Plan Note (Signed)
Not at goal.  ASCVD 10 year risk score 19.1%. Patient declines treatment with statin. Will continue to monitor.

## 2016-05-22 NOTE — Assessment & Plan Note (Signed)
Stable. Continue current Synthroid dose.

## 2016-05-22 NOTE — Assessment & Plan Note (Signed)
Stable. Continue Acebutolol and PRN Lasix.

## 2016-05-25 ENCOUNTER — Encounter: Payer: Self-pay | Admitting: Family Medicine

## 2016-07-14 ENCOUNTER — Encounter: Payer: Self-pay | Admitting: Family Medicine

## 2016-07-18 NOTE — Telephone Encounter (Signed)
Spoke to pt, Gwendolyn King and received clarification on medications needed.

## 2016-09-04 DIAGNOSIS — M6780 Other specified disorders of synovium and tendon, unspecified site: Secondary | ICD-10-CM | POA: Diagnosis not present

## 2016-09-04 DIAGNOSIS — M1990 Unspecified osteoarthritis, unspecified site: Secondary | ICD-10-CM | POA: Diagnosis not present

## 2016-09-22 ENCOUNTER — Other Ambulatory Visit: Payer: Self-pay | Admitting: Orthopaedic Surgery

## 2016-10-09 ENCOUNTER — Ambulatory Visit (HOSPITAL_COMMUNITY)
Admission: RE | Admit: 2016-10-09 | Discharge: 2016-10-09 | Disposition: A | Discharge status: Home / Self Care | Payer: PPO | Source: Ambulatory Visit | Attending: Orthopaedic Surgery | Admitting: Orthopaedic Surgery

## 2016-10-09 ENCOUNTER — Encounter (HOSPITAL_COMMUNITY)
Admission: RE | Admit: 2016-10-09 | Discharge: 2016-10-09 | Disposition: A | Discharge status: Home / Self Care | Payer: PPO | Source: Ambulatory Visit | Attending: Orthopaedic Surgery | Admitting: Orthopaedic Surgery

## 2016-10-09 ENCOUNTER — Encounter (HOSPITAL_COMMUNITY): Payer: Self-pay

## 2016-10-09 DIAGNOSIS — Z888 Allergy status to other drugs, medicaments and biological substances status: Secondary | ICD-10-CM | POA: Diagnosis not present

## 2016-10-09 DIAGNOSIS — R002 Palpitations: Secondary | ICD-10-CM | POA: Diagnosis not present

## 2016-10-09 DIAGNOSIS — Z01812 Encounter for preprocedural laboratory examination: Secondary | ICD-10-CM | POA: Insufficient documentation

## 2016-10-09 DIAGNOSIS — E785 Hyperlipidemia, unspecified: Secondary | ICD-10-CM | POA: Insufficient documentation

## 2016-10-09 DIAGNOSIS — Z01810 Encounter for preprocedural cardiovascular examination: Secondary | ICD-10-CM | POA: Insufficient documentation

## 2016-10-09 DIAGNOSIS — M1711 Unilateral primary osteoarthritis, right knee: Secondary | ICD-10-CM | POA: Insufficient documentation

## 2016-10-09 DIAGNOSIS — Z90710 Acquired absence of both cervix and uterus: Secondary | ICD-10-CM | POA: Insufficient documentation

## 2016-10-09 DIAGNOSIS — E669 Obesity, unspecified: Secondary | ICD-10-CM | POA: Diagnosis not present

## 2016-10-09 DIAGNOSIS — Z01818 Encounter for other preprocedural examination: Secondary | ICD-10-CM | POA: Diagnosis not present

## 2016-10-09 DIAGNOSIS — Z9889 Other specified postprocedural states: Secondary | ICD-10-CM | POA: Diagnosis not present

## 2016-10-09 HISTORY — DX: Constipation, unspecified: K59.00

## 2016-10-09 HISTORY — DX: Palpitations: R00.2

## 2016-10-09 HISTORY — DX: Hypothyroidism, unspecified: E03.9

## 2016-10-09 HISTORY — DX: Unspecified osteoarthritis, unspecified site: M19.90

## 2016-10-09 LAB — CBC WITH DIFFERENTIAL/PLATELET
Basophils Absolute: 0 10*3/uL (ref 0.0–0.1)
Basophils Relative: 0 %
Eosinophils Absolute: 0.3 10*3/uL (ref 0.0–0.7)
Eosinophils Relative: 4 %
HCT: 44.8 % (ref 36.0–46.0)
Hemoglobin: 15.3 g/dL — ABNORMAL HIGH (ref 12.0–15.0)
Lymphocytes Relative: 45 %
Lymphs Abs: 3.5 10*3/uL (ref 0.7–4.0)
MCH: 30.1 pg (ref 26.0–34.0)
MCHC: 34.2 g/dL (ref 30.0–36.0)
MCV: 88.2 fL (ref 78.0–100.0)
Monocytes Absolute: 0.5 10*3/uL (ref 0.1–1.0)
Monocytes Relative: 6 %
Neutro Abs: 3.5 10*3/uL (ref 1.7–7.7)
Neutrophils Relative %: 45 %
Platelets: 127 10*3/uL — ABNORMAL LOW (ref 150–400)
RBC: 5.08 MIL/uL (ref 3.87–5.11)
RDW: 12.8 % (ref 11.5–15.5)
WBC: 7.8 10*3/uL (ref 4.0–10.5)

## 2016-10-09 LAB — PROTIME-INR
INR: 1.06
Prothrombin Time: 13.8 s (ref 11.4–15.2)

## 2016-10-09 LAB — BASIC METABOLIC PANEL WITH GFR
Anion gap: 11 (ref 5–15)
BUN: 19 mg/dL (ref 6–20)
CO2: 30 mmol/L (ref 22–32)
Calcium: 10.6 mg/dL — ABNORMAL HIGH (ref 8.9–10.3)
Chloride: 100 mmol/L — ABNORMAL LOW (ref 101–111)
Creatinine, Ser: 1.06 mg/dL — ABNORMAL HIGH (ref 0.44–1.00)
GFR calc Af Amer: 59 mL/min — ABNORMAL LOW
GFR calc non Af Amer: 51 mL/min — ABNORMAL LOW
Glucose, Bld: 107 mg/dL — ABNORMAL HIGH (ref 65–99)
Potassium: 3.4 mmol/L — ABNORMAL LOW (ref 3.5–5.1)
Sodium: 141 mmol/L (ref 135–145)

## 2016-10-09 LAB — SURGICAL PCR SCREEN
MRSA, PCR: NEGATIVE
Staphylococcus aureus: NEGATIVE

## 2016-10-09 LAB — TYPE AND SCREEN
ABO/RH(D): B NEG
Antibody Screen: NEGATIVE

## 2016-10-09 LAB — URINALYSIS, ROUTINE W REFLEX MICROSCOPIC
Bacteria, UA: NONE SEEN
Bilirubin Urine: NEGATIVE
Glucose, UA: NEGATIVE mg/dL
Ketones, ur: NEGATIVE mg/dL
Nitrite: NEGATIVE
Protein, ur: NEGATIVE mg/dL
Specific Gravity, Urine: 1.009 (ref 1.005–1.030)
Squamous Epithelial / HPF: NONE SEEN
pH: 7 (ref 5.0–8.0)

## 2016-10-09 LAB — APTT: aPTT: 27 s (ref 24–36)

## 2016-10-09 LAB — ABO/RH: ABO/RH(D): B NEG

## 2016-10-09 NOTE — H&P (Signed)
TOTAL KNEE ADMISSION H&P  Patient is being admitted for right total knee arthroplasty.  Subjective:  Chief Complaint:right knee pain.  HPI: Gwendolyn King, 76 y.o. female, has a history of pain and functional disability in the right knee due to arthritis and has failed non-surgical conservative treatments for greater than 12 weeks to includeNSAID's and/or analgesics, flexibility and strengthening excercises, use of assistive devices, weight reduction as appropriate and activity modification.  Onset of symptoms was gradual, starting 5 years ago with gradually worsening course since that time. The patient noted no past surgery on the right knee(s).  Patient currently rates pain in the right knee(s) at 10 out of 10 with activity. Patient has night pain, worsening of pain with activity and weight bearing, pain that interferes with activities of daily living, crepitus and joint swelling.  Patient has evidence of subchondral cysts, subchondral sclerosis, periarticular osteophytes and joint space narrowing by imaging studies. There is no active infection.  Patient Active Problem List   Diagnosis Date Noted  . Right knee pain 05/22/2016  . Obesity 05/18/2016  . Hyperlipidemia 03/26/2015  . Palpitations 02/03/2015  . Edema 12/14/2014  . Medicare annual wellness visit, subsequent 07/06/2012  . Hypothyroidism 05/01/2012  . Hypertension 05/01/2012   Past Medical History:  Diagnosis Date  . Allergy    hay fever, fall allergies  . Asthma    as a child, dad was smoker  . Chicken pox   . Colon polyp   . Hyperlipidemia   . Hypothyroidism   . Thyroid disease     Past Surgical History:  Procedure Laterality Date  . ABDOMINAL HYSTERECTOMY  1981  . APPENDECTOMY  2012  . COLONOSCOPY    . COLONOSCOPY W/ POLYPECTOMY    . FINGER FRACTURE SURGERY Left    5th  . ROTATOR CUFF REPAIR Right   . TONSILLECTOMY AND ADENOIDECTOMY  1963  . VAGINAL DELIVERY     3    No prescriptions prior to admission.    Allergies  Allergen Reactions  . Lipitor [Atorvastatin]     Myalgia, arthralgia  . Simvastatin     myalgia    Social History  Substance Use Topics  . Smoking status: Never Smoker  . Smokeless tobacco: Never Used  . Alcohol use No    Family History  Problem Relation Age of Onset  . Arthritis Mother   . Heart disease Mother   . Hypertension Mother   . Arthritis Father   . Heart disease Father   . Hypertension Father   . Stroke Maternal Grandmother   . Cancer Maternal Aunt     abdominal?  . Hyperlipidemia Maternal Aunt   . Diabetes Paternal Aunt   . Hyperlipidemia Other   . Diabetes Other   . Breast cancer Neg Hx      Review of Systems  Musculoskeletal: Positive for joint pain.       Right knee  All other systems reviewed and are negative.   Objective:  Physical Exam  Constitutional: She is oriented to person, place, and time. She appears well-developed and well-nourished.  HENT:  Head: Normocephalic and atraumatic.  Eyes: Pupils are equal, round, and reactive to light.  Neck: Normal range of motion.  Cardiovascular: Normal rate and regular rhythm.   Respiratory: Effort normal.  GI: Soft.  Musculoskeletal:  Right knee has a moderate valgus deformity. She continues with lateral joint line pain and crepitation. There is no effusion. Her motion remains about 0-120. Opposite knee has a lesser valgus  deformity. Hip motion is full on both sides. Sensation and motor function are intact in her feet with palpable pulses on both sides. There is no palpable lymphadenopathy behind her knee.   Neurological: She is alert and oriented to person, place, and time.  Skin: Skin is warm and dry.  Psychiatric: She has a normal mood and affect. Her behavior is normal. Judgment and thought content normal.    Vital signs in last 24 hours: Temp:  [97.6 F (36.4 C)] 97.6 F (36.4 C) (02/19 1430) Pulse Rate:  [62] 62 (02/19 1430) Resp:  [20] 20 (02/19 1430) BP: (137)/(61) 137/61  (02/19 1430) SpO2:  [98 %] 98 % (02/19 1430) Weight:  [71.9 kg (158 lb 8 oz)] 71.9 kg (158 lb 8 oz) (02/19 1430)  Labs:   Estimated body mass index is 29.95 kg/m as calculated from the following:   Height as of 10/09/16: 5\' 1"  (1.549 m).   Weight as of 10/09/16: 71.9 kg (158 lb 8 oz).   Imaging Review Plain radiographs demonstrate severe degenerative joint disease of the right knee(s). The overall alignment isneutral. The bone quality appears to be good for age and reported activity level.  Assessment/Plan:  End stage primary arthritis, right knee   The patient history, physical examination, clinical judgment of the provider and imaging studies are consistent with end stage degenerative joint disease of the right knee(s) and total knee arthroplasty is deemed medically necessary. The treatment options including medical management, injection therapy arthroscopy and arthroplasty were discussed at length. The risks and benefits of total knee arthroplasty were presented and reviewed. The risks due to aseptic loosening, infection, stiffness, patella tracking problems, thromboembolic complications and other imponderables were discussed. The patient acknowledged the explanation, agreed to proceed with the plan and consent was signed. Patient is being admitted for inpatient treatment for surgery, pain control, PT, OT, prophylactic antibiotics, VTE prophylaxis, progressive ambulation and ADL's and discharge planning. The patient is planning to be discharged home with home health services

## 2016-10-09 NOTE — Pre-Procedure Instructions (Signed)
    Gwendolyn King  10/09/2016    Your procedure is scheduled on Monday, February 27.  Report to [facility] Admitting at 5:30 AM                 Your surgery or procedure is scheduled for 7:30 AM   Call this number if you have problems the morning of surgery: [phone]                For any other questions, please call [phone], Monday - Friday 8 AM - 4 PM.     Remember:  Do not eat food or drink liquids after midnight Sunday, February 26.  Take these medicines the morning of surgery with A SIP OF WATER: none               1 Week prior to surgery STOP taking Aspirin, Aspirin Products (Goody Powder, Excedrin Migraine), Ibuprofen (Advil), Naproxen (Aleve), Vitiamins and Herbal Products (ie Fish Oil)    Do not wear jewelry, make-up or nail polish.  Do not wear lotions, powders, or perfumes, or deodorant.  Do not shave 48 hours prior to surgery.    Do not bring valuables to the hospital.  [facility] is not responsible for any belongings or valuables.  Contacts, dentures or bridgework may not be worn into surgery.  Leave your suitcase in the car.  After surgery it may be brought to your room.  For patients admitted to the hospital, discharge time will be determined by your treatment team.  Special instructions: Review  [place] - Preparing For Surgery.  Please read over the following fact sheets that you were given: [facility]- Preparing For Surgery and Patient Instructions for Mupirocin Application, Incentive Spirometry, Pain Booklet

## 2016-10-17 ENCOUNTER — Encounter (HOSPITAL_COMMUNITY)
Admission: RE | Disposition: A | Discharge status: Home Health | Payer: Self-pay | Source: Ambulatory Visit | Attending: Orthopaedic Surgery

## 2016-10-17 ENCOUNTER — Inpatient Hospital Stay (HOSPITAL_COMMUNITY): Payer: PPO | Admitting: Anesthesiology

## 2016-10-17 ENCOUNTER — Encounter (HOSPITAL_COMMUNITY): Payer: Self-pay | Admitting: *Deleted

## 2016-10-17 ENCOUNTER — Inpatient Hospital Stay (HOSPITAL_COMMUNITY)
Admission: RE | Admit: 2016-10-17 | Discharge: 2016-10-19 | DRG: 470 | Disposition: A | Discharge status: Home Health | Payer: PPO | Source: Ambulatory Visit | Attending: Orthopaedic Surgery | Admitting: Orthopaedic Surgery

## 2016-10-17 DIAGNOSIS — M21061 Valgus deformity, not elsewhere classified, right knee: Secondary | ICD-10-CM | POA: Diagnosis present

## 2016-10-17 DIAGNOSIS — Z8261 Family history of arthritis: Secondary | ICD-10-CM

## 2016-10-17 DIAGNOSIS — E785 Hyperlipidemia, unspecified: Secondary | ICD-10-CM | POA: Diagnosis not present

## 2016-10-17 DIAGNOSIS — I10 Essential (primary) hypertension: Secondary | ICD-10-CM | POA: Diagnosis present

## 2016-10-17 DIAGNOSIS — Z888 Allergy status to other drugs, medicaments and biological substances status: Secondary | ICD-10-CM | POA: Diagnosis not present

## 2016-10-17 DIAGNOSIS — M1711 Unilateral primary osteoarthritis, right knee: Secondary | ICD-10-CM | POA: Diagnosis present

## 2016-10-17 DIAGNOSIS — E039 Hypothyroidism, unspecified: Secondary | ICD-10-CM | POA: Diagnosis present

## 2016-10-17 DIAGNOSIS — M21062 Valgus deformity, not elsewhere classified, left knee: Secondary | ICD-10-CM | POA: Diagnosis present

## 2016-10-17 DIAGNOSIS — Z90710 Acquired absence of both cervix and uterus: Secondary | ICD-10-CM | POA: Diagnosis not present

## 2016-10-17 DIAGNOSIS — G8918 Other acute postprocedural pain: Secondary | ICD-10-CM | POA: Diagnosis not present

## 2016-10-17 DIAGNOSIS — Z79899 Other long term (current) drug therapy: Secondary | ICD-10-CM

## 2016-10-17 DIAGNOSIS — M2100 Valgus deformity, not elsewhere classified, unspecified site: Secondary | ICD-10-CM | POA: Diagnosis present

## 2016-10-17 HISTORY — PX: TOTAL KNEE ARTHROPLASTY: SHX125

## 2016-10-17 SURGERY — TOTAL KNEE ARTHROPLASTY
Anesthesia: Monitor Anesthesia Care | Laterality: Right

## 2016-10-17 MED ORDER — BUPIVACAINE-EPINEPHRINE (PF) 0.5% -1:200000 IJ SOLN
INTRAMUSCULAR | Status: DC | PRN
  Administered 2016-10-17: 20 mL

## 2016-10-17 MED ORDER — LORATADINE 10 MG PO TABS
10.0000 mg | ORAL_TABLET | Freq: Every day | ORAL | Status: DC
  Administered 2016-10-18: 10 mg via ORAL
  Filled 2016-10-17 (×3): qty 1

## 2016-10-17 MED ORDER — BISACODYL 5 MG PO TBEC: 5.0000 mg | DELAYED_RELEASE_TABLET | Freq: Every day | ORAL | Status: DC | PRN

## 2016-10-17 MED ORDER — TRANEXAMIC ACID 1000 MG/10ML IV SOLN
2000.0000 mg | INTRAVENOUS | Status: AC
  Administered 2016-10-17: 2000 mg via TOPICAL
  Filled 2016-10-17: qty 20

## 2016-10-17 MED ORDER — CEFAZOLIN SODIUM-DEXTROSE 2-4 GM/100ML-% IV SOLN
2.0000 g | Freq: Four times a day (QID) | INTRAVENOUS | Status: AC
  Administered 2016-10-17 (×2): 2 g via INTRAVENOUS
  Filled 2016-10-17 (×2): qty 100

## 2016-10-17 MED ORDER — PHENOL 1.4 % MT LIQD: 1.0000 | OROMUCOSAL | Status: DC | PRN

## 2016-10-17 MED ORDER — CEFAZOLIN SODIUM-DEXTROSE 2-4 GM/100ML-% IV SOLN
INTRAVENOUS | Status: AC
  Filled 2016-10-17: qty 100

## 2016-10-17 MED ORDER — METHOCARBAMOL 1000 MG/10ML IJ SOLN
500.0000 mg | Freq: Four times a day (QID) | INTRAMUSCULAR | Status: DC | PRN
  Filled 2016-10-17: qty 5

## 2016-10-17 MED ORDER — MENTHOL 3 MG MT LOZG: 1.0000 | LOZENGE | OROMUCOSAL | Status: DC | PRN

## 2016-10-17 MED ORDER — ONDANSETRON HCL 4 MG PO TABS: 4.0000 mg | ORAL_TABLET | Freq: Four times a day (QID) | ORAL | Status: DC | PRN

## 2016-10-17 MED ORDER — METHOCARBAMOL 500 MG PO TABS
ORAL_TABLET | ORAL | Status: AC
  Filled 2016-10-17: qty 1

## 2016-10-17 MED ORDER — SODIUM CHLORIDE 0.9 % IJ SOLN
INTRAMUSCULAR | Status: DC | PRN
  Administered 2016-10-17: 40 mL

## 2016-10-17 MED ORDER — METOCLOPRAMIDE HCL 5 MG PO TABS: 5.0000 mg | ORAL_TABLET | Freq: Three times a day (TID) | ORAL | Status: DC | PRN

## 2016-10-17 MED ORDER — DIPHENHYDRAMINE HCL 12.5 MG/5ML PO ELIX: 12.5000 mg | ORAL_SOLUTION | ORAL | Status: DC | PRN

## 2016-10-17 MED ORDER — TRANEXAMIC ACID 1000 MG/10ML IV SOLN
1000.0000 mg | Freq: Once | INTRAVENOUS | Status: AC
  Administered 2016-10-17: 1000 mg via INTRAVENOUS
  Filled 2016-10-17: qty 10

## 2016-10-17 MED ORDER — ACETAMINOPHEN 325 MG PO TABS: 650.0000 mg | ORAL_TABLET | Freq: Four times a day (QID) | ORAL | Status: DC | PRN

## 2016-10-17 MED ORDER — BUPIVACAINE-EPINEPHRINE (PF) 0.5% -1:200000 IJ SOLN
INTRAMUSCULAR | Status: AC
  Filled 2016-10-17: qty 30

## 2016-10-17 MED ORDER — LIDOCAINE HCL (CARDIAC) 20 MG/ML IV SOLN
INTRAVENOUS | Status: DC | PRN
  Administered 2016-10-17: 50 mg via INTRATRACHEAL

## 2016-10-17 MED ORDER — POLYETHYLENE GLYCOL 3350 17 G PO PACK
17.0000 g | PACK | Freq: Every day | ORAL | Status: DC
  Administered 2016-10-17 – 2016-10-19 (×3): 17 g via ORAL
  Filled 2016-10-17 (×3): qty 1

## 2016-10-17 MED ORDER — MIDAZOLAM HCL 2 MG/2ML IJ SOLN
INTRAMUSCULAR | Status: AC
  Filled 2016-10-17: qty 2

## 2016-10-17 MED ORDER — PROPOFOL 10 MG/ML IV BOLUS
INTRAVENOUS | Status: AC
  Filled 2016-10-17: qty 20

## 2016-10-17 MED ORDER — CEFAZOLIN SODIUM-DEXTROSE 2-4 GM/100ML-% IV SOLN
2.0000 g | INTRAVENOUS | Status: AC
  Administered 2016-10-17: 2 g via INTRAVENOUS

## 2016-10-17 MED ORDER — PROPOFOL 10 MG/ML IV BOLUS
INTRAVENOUS | Status: DC | PRN
  Administered 2016-10-17: 10 mg via INTRAVENOUS

## 2016-10-17 MED ORDER — FENTANYL CITRATE (PF) 100 MCG/2ML IJ SOLN
INTRAMUSCULAR | Status: AC
  Filled 2016-10-17: qty 2

## 2016-10-17 MED ORDER — MIDAZOLAM HCL 5 MG/5ML IJ SOLN
INTRAMUSCULAR | Status: DC | PRN
  Administered 2016-10-17: 2 mg via INTRAVENOUS

## 2016-10-17 MED ORDER — ASPIRIN EC 325 MG PO TBEC
325.0000 mg | DELAYED_RELEASE_TABLET | Freq: Two times a day (BID) | ORAL | Status: DC
  Administered 2016-10-18 – 2016-10-19 (×4): 325 mg via ORAL
  Filled 2016-10-17 (×4): qty 1

## 2016-10-17 MED ORDER — HYDROCODONE-ACETAMINOPHEN 5-325 MG PO TABS
1.0000 | ORAL_TABLET | ORAL | Status: DC | PRN
  Administered 2016-10-17: 2 via ORAL
  Administered 2016-10-17: 1 via ORAL
  Administered 2016-10-17 – 2016-10-19 (×10): 2 via ORAL
  Filled 2016-10-17 (×11): qty 2

## 2016-10-17 MED ORDER — HYDROCODONE-ACETAMINOPHEN 5-325 MG PO TABS
ORAL_TABLET | ORAL | Status: AC
  Filled 2016-10-17: qty 1

## 2016-10-17 MED ORDER — LEVOTHYROXINE SODIUM 75 MCG PO TABS
75.0000 ug | ORAL_TABLET | Freq: Every day | ORAL | Status: DC
  Administered 2016-10-17 – 2016-10-18 (×2): 75 ug via ORAL
  Filled 2016-10-17 (×2): qty 1

## 2016-10-17 MED ORDER — BUPIVACAINE LIPOSOME 1.3 % IJ SUSP
20.0000 mL | INTRAMUSCULAR | Status: DC
  Filled 2016-10-17: qty 20

## 2016-10-17 MED ORDER — ONDANSETRON HCL 4 MG/2ML IJ SOLN: 4.0000 mg | Freq: Four times a day (QID) | INTRAMUSCULAR | Status: DC | PRN

## 2016-10-17 MED ORDER — LACTATED RINGERS IV SOLN
INTRAVENOUS | Status: DC
  Administered 2016-10-17: 15:00:00 via INTRAVENOUS

## 2016-10-17 MED ORDER — TRANEXAMIC ACID 1000 MG/10ML IV SOLN
1000.0000 mg | INTRAVENOUS | Status: AC
  Administered 2016-10-17: 1000 mg via INTRAVENOUS
  Filled 2016-10-17: qty 10

## 2016-10-17 MED ORDER — ACEBUTOLOL HCL 200 MG PO CAPS
200.0000 mg | ORAL_CAPSULE | Freq: Every day | ORAL | Status: DC
  Administered 2016-10-17 – 2016-10-18 (×2): 200 mg via ORAL
  Filled 2016-10-17 (×3): qty 1

## 2016-10-17 MED ORDER — ALUM & MAG HYDROXIDE-SIMETH 200-200-20 MG/5ML PO SUSP: 30.0000 mL | ORAL | Status: DC | PRN

## 2016-10-17 MED ORDER — PHENYLEPHRINE HCL 10 MG/ML IJ SOLN
INTRAVENOUS | Status: DC | PRN
  Administered 2016-10-17: 30 ug/min via INTRAVENOUS

## 2016-10-17 MED ORDER — DOCUSATE SODIUM 100 MG PO CAPS
100.0000 mg | ORAL_CAPSULE | Freq: Two times a day (BID) | ORAL | Status: DC
  Administered 2016-10-17 – 2016-10-19 (×5): 100 mg via ORAL
  Filled 2016-10-17 (×6): qty 1

## 2016-10-17 MED ORDER — METHOCARBAMOL 500 MG PO TABS
500.0000 mg | ORAL_TABLET | Freq: Four times a day (QID) | ORAL | Status: DC | PRN
  Administered 2016-10-17 – 2016-10-19 (×6): 500 mg via ORAL
  Filled 2016-10-17 (×5): qty 1

## 2016-10-17 MED ORDER — FUROSEMIDE 20 MG PO TABS: 20.0000 mg | ORAL_TABLET | Freq: Every day | ORAL | Status: DC | PRN

## 2016-10-17 MED ORDER — FENTANYL CITRATE (PF) 100 MCG/2ML IJ SOLN
INTRAMUSCULAR | Status: DC | PRN
  Administered 2016-10-17 (×2): 50 ug via INTRAVENOUS

## 2016-10-17 MED ORDER — 0.9 % SODIUM CHLORIDE (POUR BTL) OPTIME
TOPICAL | Status: DC | PRN
  Administered 2016-10-17: 1000 mL

## 2016-10-17 MED ORDER — METOCLOPRAMIDE HCL 5 MG/ML IJ SOLN: 5.0000 mg | Freq: Three times a day (TID) | INTRAMUSCULAR | Status: DC | PRN

## 2016-10-17 MED ORDER — PROPOFOL 500 MG/50ML IV EMUL
INTRAVENOUS | Status: DC | PRN
  Administered 2016-10-17: 75 ug/kg/min via INTRAVENOUS

## 2016-10-17 MED ORDER — HYDROMORPHONE HCL 2 MG/ML IJ SOLN
0.5000 mg | INTRAMUSCULAR | Status: DC | PRN
  Administered 2016-10-17: 0.5 mg via INTRAVENOUS
  Administered 2016-10-18: 1 mg via INTRAVENOUS
  Administered 2016-10-18: 0.5 mg via INTRAVENOUS
  Administered 2016-10-18: 1 mg via INTRAVENOUS
  Filled 2016-10-17 (×5): qty 1

## 2016-10-17 MED ORDER — LACTATED RINGERS IV SOLN
INTRAVENOUS | Status: DC
  Administered 2016-10-17 (×2): via INTRAVENOUS

## 2016-10-17 MED ORDER — CHLORHEXIDINE GLUCONATE 4 % EX LIQD: 60.0000 mL | Freq: Once | CUTANEOUS | Status: DC

## 2016-10-17 MED ORDER — ACETAMINOPHEN 650 MG RE SUPP: 650.0000 mg | Freq: Four times a day (QID) | RECTAL | Status: DC | PRN

## 2016-10-17 MED ORDER — BUPIVACAINE LIPOSOME 1.3 % IJ SUSP
INTRAMUSCULAR | Status: DC | PRN
  Administered 2016-10-17: 08:00:00

## 2016-10-17 SURGICAL SUPPLY — 51 items
BAG DECANTER FOR FLEXI CONT (MISCELLANEOUS) ×2
BANDAGE ACE 4X5 VEL STRL LF (GAUZE/BANDAGES/DRESSINGS) ×2
BLADE SAGITTAL 25.0X1.19X90 (BLADE) ×2
BLADE SAW SGTL 13.0X1.19X90.0M (BLADE)
BNDG ELASTIC 6X10 VLCR STRL LF (GAUZE/BANDAGES/DRESSINGS) ×2
BNDG ESMARK 6X9 LF (GAUZE/BANDAGES/DRESSINGS) ×2
BNDG GAUZE ELAST 4 BULKY (GAUZE/BANDAGES/DRESSINGS) ×4
BOWL SMART MIX CTS (DISPOSABLE) ×2
CAP KNEE TOTAL 3 SIGMA ×2 IMPLANT
CEMENT HV SMART SET (Cement) ×4 IMPLANT
COVER SURGICAL LIGHT HANDLE (MISCELLANEOUS) ×2
CUFF TOURNIQUET SINGLE 34IN LL (TOURNIQUET CUFF) ×2
CUFF TOURNIQUET SINGLE 44IN (TOURNIQUET CUFF)
DECANTER SPIKE VIAL GLASS SM (MISCELLANEOUS) ×2
DRAPE EXTREMITY T 121X128X90 (DRAPE) ×2
DRAPE PROXIMA HALF (DRAPES) ×4
DRAPE U-SHAPE 47X51 STRL (DRAPES) ×2
DRSG ADAPTIC 3X8 NADH LF (GAUZE/BANDAGES/DRESSINGS) ×2
DRSG PAD ABDOMINAL 8X10 ST (GAUZE/BANDAGES/DRESSINGS) ×2
DURAPREP 26ML APPLICATOR (WOUND CARE) ×2
ELECT REM PT RETURN 9FT ADLT (ELECTROSURGICAL) ×2
GAUZE SPONGE 4X4 12PLY STRL (GAUZE/BANDAGES/DRESSINGS) ×2
GLOVE BIO SURGEON STRL SZ8 (GLOVE) ×4
GLOVE BIOGEL PI IND STRL 8 (GLOVE) ×2
GLOVE BIOGEL PI INDICATOR 8 (GLOVE) ×2
GOWN STRL REUS W/ TWL LRG LVL3 (GOWN DISPOSABLE) ×1
GOWN STRL REUS W/ TWL XL LVL3 (GOWN DISPOSABLE) ×2
GOWN STRL REUS W/TWL LRG LVL3 (GOWN DISPOSABLE) ×1
GOWN STRL REUS W/TWL XL LVL3 (GOWN DISPOSABLE) ×2
HANDPIECE INTERPULSE COAX TIP (DISPOSABLE) ×1
HOOD PEEL AWAY FACE SHEILD DIS (HOOD) ×4
IMMOBILIZER KNEE 22 UNIV (SOFTGOODS) ×2
KIT BASIN OR (CUSTOM PROCEDURE TRAY) ×2
KIT ROOM TURNOVER OR (KITS) ×2
MANIFOLD NEPTUNE II (INSTRUMENTS) ×2
NEEDLE HYPO 21X1 ECLIPSE (NEEDLE) ×2
NS IRRIG 1000ML POUR BTL (IV SOLUTION) ×2
PACK TOTAL JOINT (CUSTOM PROCEDURE TRAY) ×2
PAD ARMBOARD 7.5X6 YLW CONV (MISCELLANEOUS) ×8
SET HNDPC FAN SPRY TIP SCT (DISPOSABLE) ×1
STRIP CLOSURE SKIN 1/2X4 (GAUZE/BANDAGES/DRESSINGS) ×2
SUT MNCRL AB 3-0 PS2 18 (SUTURE) ×2
SUT VIC AB 0 CT1 27 (SUTURE) ×2
SUT VIC AB 0 CT1 27XBRD ANBCTR (SUTURE) ×2
SUT VIC AB 2-0 CT1 27 (SUTURE) ×2
SUT VIC AB 2-0 CT1 TAPERPNT 27 (SUTURE) ×2
SUT VLOC 180 0 24IN GS25 (SUTURE) ×2
SYR 50ML LL SCALE MARK (SYRINGE) ×2
TOWEL OR 17X24 6PK STRL BLUE (TOWEL DISPOSABLE) ×2
TOWEL OR 17X26 10 PK STRL BLUE (TOWEL DISPOSABLE) ×2
TRAY CATH 16FR W/PLASTIC CATH (SET/KITS/TRAYS/PACK) ×2

## 2016-10-17 NOTE — Anesthesia Procedure Notes (Signed)
Anesthesia Regional Block: Adductor canal block   Pre-Anesthetic Checklist: ,, timeout performed, Correct Patient, Correct Site, Correct Laterality, Correct Procedure, Correct Position, site marked, Risks and benefits discussed,  Surgical consent,  Pre-op evaluation,  At surgeon's request and post-op pain management  Laterality: Right  Prep: chloraprep       Needles:  Injection technique: Single-shot  Needle Type: Echogenic Needle     Needle Length: 9cm  Needle Gauge: 21     Additional Needles:   Procedures: ultrasound guided,,,,,,,,  Narrative:  Start time: 10/17/2016 7:12 AM End time: 10/17/2016 7:17 AM Injection made incrementally with aspirations every 5 mL.  Performed by: Personally  Anesthesiologist: Catalina Gravel  Additional Notes: No pain on injection. No increased resistance to injection. Injection made in 5cc increments.  Good needle visualization.  Patient tolerated procedure well.

## 2016-10-17 NOTE — Transfer of Care (Signed)
Immediate Anesthesia Transfer of Care Note  Patient: Peggye Fothergill  Procedure(s) Performed: Procedure(s): TOTAL KNEE ARTHROPLASTY (Right)  Patient Location: PACU  Anesthesia Type:Spinal  Level of Consciousness: awake, alert  and oriented  Airway & Oxygen Therapy: Patient Spontanous Breathing and Patient connected to nasal cannula oxygen  Post-op Assessment: Report given to RN and Post -op Vital signs reviewed and stable  Post vital signs: Reviewed and stable  Last Vitals:  Vitals:   10/17/16 0648 10/17/16 0947  BP: (!) 138/37   Pulse:    Resp:    Temp:  36.4 C    Last Pain:  Vitals:   10/17/16 0647  TempSrc: Oral  PainSc:       Patients Stated Pain Goal: 1 ([id number])  Complications: No apparent anesthesia complications

## 2016-10-17 NOTE — Anesthesia Postprocedure Evaluation (Signed)
Anesthesia Post Note  Patient: Gwendolyn King  Procedure(s) Performed: Procedure(s) (LRB): TOTAL KNEE ARTHROPLASTY (Right)  Patient location during evaluation: PACU Anesthesia Type: Spinal Level of consciousness: oriented and awake and alert Pain management: pain level controlled Vital Signs Assessment: post-procedure vital signs reviewed and stable Respiratory status: spontaneous breathing, respiratory function stable and patient connected to nasal cannula oxygen Cardiovascular status: blood pressure returned to baseline and stable Postop Assessment: no headache and no backache Anesthetic complications: no       Last Vitals:  Vitals:   10/17/16 1304 10/17/16 1350  BP:  (!) 138/46  Pulse: (!) 58 61  Resp: 15 16  Temp: 36.3 C 37 C    Last Pain:  Vitals:   10/17/16 1354  TempSrc:   PainSc: 2                  Jazari Ober DAVID

## 2016-10-17 NOTE — Evaluation (Signed)
Physical Therapy Evaluation Patient Details Name: Gwendolyn King MRN: [id number] DOB: Jul 23, 1941 Today's Date: 10/17/2016   History of Present Illness  Patient is a 76 yo female admitted 10/17/16 now s/p Rt TKA.    PMH:  HLD, HTN, asthma, arthritis  Clinical Impression  Patient presents with problems listed below.  Will benefit from acute PT to maximize functional mobility prior to discharge home with husband.  Recommend f/u HHPT for continued therapy at d/c.    Follow Up Recommendations Home health PT;Supervision for mobility/OOB    Equipment Recommendations  None recommended by PT    Recommendations for Other Services       Precautions / Restrictions Precautions Precautions: Knee Precaution Booklet Issued: Yes (comment) Precaution Comments: Reviewed precautions with patient and husband Required Braces or Orthoses: Knee Immobilizer - Right Knee Immobilizer - Right: On when out of bed or walking Restrictions Weight Bearing Restrictions: Yes RLE Weight Bearing: Weight bearing as tolerated      Mobility  Bed Mobility Overal bed mobility: Needs Assistance Bed Mobility: Supine to Sit     Supine to sit: Min assist;HOB elevated     General bed mobility comments: Instructed patient and husband on donning KI on RLE.  Verbal cues for bed mobility.  Assist for RLE management only.  Transfers Overall transfer level: Needs assistance Equipment used: Rolling walker (2 wheeled) Transfers: Sit to/from [facility] Sit to Stand: Min assist Stand pivot transfers: Min assist       General transfer comment: Verbal cues for technique.  Assist to rise to standing and to steady.  Patient able to take shuffle steps to pivot bed <> BSC.  Ambulation/Gait Ambulation/Gait assistance: Min assist Ambulation Distance (Feet): 4 Feet Assistive device: Rolling walker (2 wheeled) Gait Pattern/deviations: Step-to pattern;Decreased stance time - right;Decreased step length -  left;Decreased stride length;Decreased weight shift to right;Shuffle;Antalgic Gait velocity: decreased Gait velocity interpretation: Below normal speed for age/gender General Gait Details: Verbal cues for safe use of RW and gait sequence.  Difficulty bearing weight on RLE initially - improved with time.  Stairs            Wheelchair Mobility    Modified Rankin (Stroke Patients Only)       Balance                                             Pertinent Vitals/Pain Pain Assessment: 0-10 Pain Score: 3  Pain Location: Rt knee Pain Descriptors / Indicators: Aching;Sore Pain Intervention(s): Monitored during session;Repositioned    Home Living Family/patient expects to be discharged to:: Private residence Living Arrangements: Spouse/significant other Available Help at Discharge: Family;Available 24 hours/day Type of Home: House Home Access: Stairs to enter Entrance Stairs-Rails: Psychiatric nurse of Steps: 4 Home Layout: Two level;Able to live on main level with bedroom/bathroom Home Equipment: Gilford Rile - 2 wheels;Bedside commode;Walker - 4 wheels      Prior Function Level of Independence: Independent         Comments: Active. Drives. Attends exercise classes     Hand Dominance        Extremity/Trunk Assessment   Upper Extremity Assessment Upper Extremity Assessment: Overall WFL for tasks assessed    Lower Extremity Assessment Lower Extremity Assessment: RLE deficits/detail RLE Deficits / Details: Decreased strength and ROM post-op RLE: Unable to fully assess due to pain  Communication   Communication: No difficulties  Cognition Arousal/Alertness: Awake/alert Behavior During Therapy: WFL for tasks assessed/performed Overall Cognitive Status: Within Functional Limits for tasks assessed                      General Comments      Exercises Total Joint Exercises Ankle Circles/Pumps: AROM;Both;10  reps;Seated Quad Sets: AROM;Both;10 reps;Seated   Assessment/Plan    PT Assessment Patient needs continued PT services  PT Problem List Decreased strength;Decreased range of motion;Decreased activity tolerance;Decreased balance;Decreased mobility;Decreased knowledge of use of DME;Decreased knowledge of precautions;Pain       PT Treatment Interventions DME instruction;Gait training;Stair training;Functional mobility training;Therapeutic activities;Therapeutic exercise;Patient/family education    PT Goals (Current goals can be found in the Care Plan section)  Acute Rehab PT Goals Patient Stated Goal: To return to exercise class PT Goal Formulation: With patient/family Time For Goal Achievement: 10/24/16 Potential to Achieve Goals: Good    Frequency 7X/week   Barriers to discharge        Co-evaluation               End of Session Equipment Utilized During Treatment: Gait belt Activity Tolerance: Patient limited by pain Patient left: in chair;with call bell/phone within reach;with family/visitor present Nurse Communication: Mobility status PT Visit Diagnosis: Muscle weakness (generalized) (M62.81);Difficulty in walking, not elsewhere classified (R26.2);Pain Pain - Right/Left: Right Pain - part of body: Knee         Time: [id number] PT Time Calculation (min) (ACUTE ONLY): 31 min   Charges:   PT Evaluation $PT Eval Moderate Complexity: 1 Procedure PT Treatments $Therapeutic Activity: 8-22 mins   PT G Codes:         Despina Pole 10/26/16, 7:11 PM Carita Pian. Sanjuana Kava, [place] Pager [phone]

## 2016-10-17 NOTE — Progress Notes (Signed)
Care of pt assumed by MA Desiderio Dolata RN 

## 2016-10-17 NOTE — Anesthesia Procedure Notes (Signed)
Spinal  Patient location during procedure: OR Start time: 10/17/2016 7:30 AM End time: 10/17/2016 8:34 AM Staffing Anesthesiologist: Lillia Abed Performed: anesthesiologist  Preanesthetic Checklist Completed: patient identified, site marked, surgical consent, pre-op evaluation, timeout performed, IV checked, risks and benefits discussed and monitors and equipment checked Spinal Block Patient position: sitting Prep: DuraPrep Patient monitoring: heart rate, cardiac monitor, continuous pulse ox and blood pressure Approach: right paramedian Location: L3-4 Injection technique: single-shot Needle Needle type: Pencan  Needle gauge: 24 G Needle length: 9 cm Needle insertion depth: 6 cm

## 2016-10-17 NOTE — Progress Notes (Signed)
Orthopedic Tech Progress Note Patient Details:  Gwendolyn King July 11, 1941 [id number]  CPM Right Knee CPM Right Knee: On Right Knee Flexion (Degrees): 90 Right Knee Extension (Degrees): 0 Additional Comments: trapeze bar patient helper   [name] 10/17/2016, 10:09 AM Viewed order from doctor's order list

## 2016-10-17 NOTE — Interval H&P Note (Signed)
History and Physical Interval Note:  10/17/2016 7:19 AM  Gwendolyn King  has presented today for surgery, with the diagnosis of RIGHT KNEE DEGENERATIVE JOINT DISEASE  The various methods of treatment have been discussed with the patient and family. After consideration of risks, benefits and other options for treatment, the patient has consented to  Procedure(s): TOTAL KNEE ARTHROPLASTY (Right) as a surgical intervention .  The patient's history has been reviewed, patient examined, no change in status, stable for surgery.  I have reviewed the patient's chart and labs.  Questions were answered to the patient's satisfaction.     Dhara Schepp G

## 2016-10-17 NOTE — Op Note (Signed)
PREOP DIAGNOSIS: DJD RIGHT KNEE POSTOP DIAGNOSIS: same PROCEDURE: RIGHT TKR ANESTHESIA: Spinal and MAC ATTENDING SURGEON: Gryphon Vanderveen G ASSISTANT: Loni Dolly PA  INDICATIONS FOR PROCEDURE: Gwendolyn King is a 76 y.o. female who has struggled for a long time with pain due to degenerative arthritis of the right knee.  The patient has failed many conservative non-operative measures and at this point has pain which limits the ability to sleep and walk.  The patient is offered total knee replacement.  Informed operative consent was obtained after discussion of possible risks of anesthesia, infection, neurovascular injury, DVT, and death.  The importance of the post-operative rehabilitation protocol to optimize result was stressed extensively with the patient.  SUMMARY OF FINDINGS AND PROCEDURE:  Gwendolyn King was taken to the operative suite where under the above anesthesia a right knee replacement was performed.  There were advanced degenerative changes and the bone quality was good.  We used the DePuy LCS system and placed size medium femur, 2.5 tibia, 32 mm all polyethylene patella, and a size 10 mm spacer.  She did have a moderate valgus deformity which we did our best to correct.  Loni Dolly PA-C assisted throughout and was invaluable to the completion of the case in that he helped retract and maintain exposure while I placed components.  He also helped close thereby minimizing OR time. The patient was admitted for appropriate post-op care to include perioperative antibiotics and mechanical and pharmacologic measures for DVT prophylaxis.  DESCRIPTION OF PROCEDURE:  Gwendolyn King was taken to the operative suite where the above anesthesia was applied.  The patient was positioned supine and prepped and draped in normal sterile fashion.  An appropriate time out was performed.  After the administration of kefzol pre-op antibiotic the leg was elevated and exsanguinated and a tourniquet inflated. A  standard longitudinal incision was made on the anterior knee.  Dissection was carried down to the extensor mechanism.  All appropriate anti-infective measures were used including the pre-operative antibiotic, betadine impregnated drape, and closed hooded exhaust systems for each member of the surgical team.  A medial parapatellar incision was made in the extensor mechanism and the knee cap flipped and the knee flexed.  Some residual meniscal tissues were removed along with any remaining ACL/PCL tissue.  A guide was placed on the tibia and a flat cut was made on it's superior surface.  An intramedullary guide was placed in the femur and was utilized to make anterior and posterior cuts creating an appropriate flexion gap.  A second intramedullary guide was placed in the femur to make a distal cut properly balancing the knee with an extension gap equal to the flexion gap.  The three bones sized to the above mentioned sizes and the appropriate guides were placed and utilized.  A trial reduction was done and the knee easily came to full extension and the patella tracked well on flexion.  The trial components were removed and all bones were cleaned with pulsatile lavage and then dried thoroughly.  Cement was mixed and was pressurized onto the bones followed by placement of the aforementioned components.  Excess cement was trimmed and pressure was held on the components until the cement had hardened.  The tourniquet was deflated and a small amount of bleeding was controlled with cautery and pressure.  The knee was irrigated thoroughly.  The extensor mechanism was re-approximated with V-loc suture in running fashion.  The knee was flexed and the repair was solid.  The subcutaneous  tissues were re-approximated with #0 and #2-0 vicryl and the skin closed with a subcuticular stitch and steristrips.  A sterile dressing was applied.  Intraoperative fluids, EBL, and tourniquet time can be obtained from anesthesia  records.  DISPOSITION:  The patient was taken to recovery room in stable condition and admitted for appropriate post-op care to include peri-operative antibiotic and DVT prophylaxis with mechanical and pharmacologic measures.  Reverie Vaquera G 10/17/2016, 9:15 AM

## 2016-10-17 NOTE — Anesthesia Preprocedure Evaluation (Signed)
Anesthesia Evaluation  Patient identified by MRN, date of birth, ID band Patient awake    Reviewed: Allergy & Precautions, NPO status , Patient's Chart, lab work & pertinent test results  Airway Mallampati: II  TM Distance: >3 FB Neck ROM: Full    Dental   Pulmonary    Pulmonary exam normal        Cardiovascular hypertension, Pt. on medications Normal cardiovascular exam     Neuro/Psych    GI/Hepatic   Endo/Other    Renal/GU      Musculoskeletal   Abdominal   Peds  Hematology   Anesthesia Other Findings   Reproductive/Obstetrics                             Anesthesia Physical Anesthesia Plan  ASA: II  Anesthesia Plan: Spinal and MAC   Post-op Pain Management:  Regional for Post-op pain   Induction: Intravenous  Airway Management Planned: Simple Face Mask  Additional Equipment:   Intra-op Plan:   Post-operative Plan: Extubation in OR  Informed Consent: I have reviewed the patients History and Physical, chart, labs and discussed the procedure including the risks, benefits and alternatives for the proposed anesthesia with the patient or authorized representative who has indicated his/her understanding and acceptance.     Plan Discussed with: CRNA and Surgeon  Anesthesia Plan Comments:         Anesthesia Quick Evaluation

## 2016-10-18 ENCOUNTER — Encounter (HOSPITAL_COMMUNITY): Payer: Self-pay | Admitting: Orthopaedic Surgery

## 2016-10-18 NOTE — Progress Notes (Signed)
Physical Therapy Treatment Patient Details Name: Gwendolyn King MRN: [id number] DOB: 07-15-1941 Today's Date: 10/18/2016    History of Present Illness Patient is a 76 yo female admitted 10/17/16 now s/p Rt TKA.    PMH:  HLD, HTN, asthma, arthritis    PT Comments    Pt is POD #1 and this is her second session.  She is slowly progressing gait down the hallway.  She is min assist overall, but limited by pain and decreased endurance in her arms.  She would benefit from continued acute stay to increase her mobility and independence before returning home with her family's assist.   Follow Up Recommendations  Home health PT;Supervision for mobility/OOB     Equipment Recommendations  None recommended by PT    Recommendations for Other Services   NA     Precautions / Restrictions Precautions Precautions: Knee Precaution Booklet Issued: Yes (comment) Precaution Comments: Reviewed precautions with patient and husband Required Braces or Orthoses: Knee Immobilizer - Right Knee Immobilizer - Right: On when out of bed or walking Restrictions RLE Weight Bearing: Weight bearing as tolerated    Mobility  Bed Mobility Overal bed mobility: Needs Assistance Bed Mobility: Supine to Sit     Supine to sit: Min assist     General bed mobility comments: Min assist to help progress her right leg to EOB.   Transfers Overall transfer level: Needs assistance Equipment used: Rolling walker (2 wheeled) Transfers: Sit to/from Stand Sit to Stand: Min assist         General transfer comment: Min assist from bed and higher BSC in bathroom.  Verbal cues to reinforce correct hand placement during transitions.   Ambulation/Gait Ambulation/Gait assistance: Min assist Ambulation Distance (Feet): 65 Feet Assistive device: Rolling walker (2 wheeled) Gait Pattern/deviations: Step-to pattern;Antalgic Gait velocity: decreased Gait velocity interpretation: <1.8 ft/sec, indicative of risk for recurrent  falls (much less, it took [place] 30 mins to go 65') General Gait Details: Moderately antalgic gait pattern, verbal cues for safe RW use, to put foot flat instead of going up on her toes during stance and for safe RW use.  May try youth RW next session, it may help with arm fatigue.           Balance Overall balance assessment: Needs assistance Sitting-balance support: Feet supported;Bilateral upper extremity supported;No upper extremity supported Sitting balance-Leahy Scale: Good     Standing balance support: Bilateral upper extremity supported Standing balance-Leahy Scale: Fair                      Cognition Arousal/Alertness: Awake/alert Behavior During Therapy: WFL for tasks assessed/performed Overall Cognitive Status: Within Functional Limits for tasks assessed                      Exercises Total Joint Exercises Ankle Circles/Pumps: AROM;Both;20 reps Quad Sets: AROM;Right;10 reps Towel Squeeze: AROM;Both;10 reps Heel Slides: AAROM;Right;10 reps Goniometric ROM: 25-70        Pertinent Vitals/Pain Pain Assessment: 0-10 Pain Score: 8  Pain Location: right knee Pain Descriptors / Indicators: Aching;Burning;Grimacing;Guarding Pain Intervention(s): Limited activity within patient's tolerance;Monitored during session;Repositioned           PT Goals (current goals can now be found in the care plan section) Acute Rehab PT Goals Patient Stated Goal: To return to exercise class Progress towards PT goals: Progressing toward goals    Frequency    7X/week      PT Plan Current plan  remains appropriate       End of Session Equipment Utilized During Treatment: Right knee immobilizer Activity Tolerance: Patient limited by fatigue;Patient limited by pain Patient left: in chair;with call bell/phone within reach;with family/visitor present   PT Visit Diagnosis: Muscle weakness (generalized) (M62.81);Difficulty in walking, not elsewhere classified  (R26.2);Pain Pain - Right/Left: Right Pain - part of body: Knee     Time: [id number] PT Time Calculation (min) (ACUTE ONLY): 46 min  Charges:  $Gait Training: 23-37 mins $Therapeutic Exercise: 8-22 mins                      Kevork Joyce B. [place], [place], DPT [phone]   10/18/2016, 5:40 PM

## 2016-10-18 NOTE — Progress Notes (Signed)
Physical Therapy Treatment Patient Details Name: Gwendolyn King MRN: [id number] DOB: November 07, 1940 Today's Date: 10/18/2016    History of Present Illness Patient is a 76 yo female admitted 10/17/16 now s/p Rt TKA.    PMH:  HLD, HTN, asthma, arthritis    PT Comments    Pt was able to progress gait into the hallway, is slow and painful.  She will likely need a bit extra time to progress well enough to go home.  PT will continue to follow acutely to progress.   Follow Up Recommendations  Home health PT;Supervision for mobility/OOB     Equipment Recommendations  None recommended by PT    Recommendations for Other Services   NA     Precautions / Restrictions Precautions Precautions: Knee Precaution Comments: Reviewed precautions with patient and husband Required Braces or Orthoses: Knee Immobilizer - Right Knee Immobilizer - Right: On when out of bed or walking Restrictions Weight Bearing Restrictions: Yes RLE Weight Bearing: Weight bearing as tolerated    Mobility  Bed Mobility Overal bed mobility: Needs Assistance Bed Mobility: Sit to Supine      Sit to supine: Min assist   General bed mobility comments: Min assist to help lift and progress right leg back into bed.   Transfers Overall transfer level: Needs assistance Equipment used: Rolling walker (2 wheeled) Transfers: Sit to/from Stand Sit to Stand: Min assist         General transfer comment: Min assist from both recliner chair and elevated BSC to help steady pt during transitions and stabilize RW.  Verbal cues for safe hand placement.   Ambulation/Gait Ambulation/Gait assistance: Min assist Ambulation Distance (Feet): 35 Feet Assistive device: Rolling walker (2 wheeled) Gait Pattern/deviations: Step-to pattern;Antalgic Gait velocity: decreased Gait velocity interpretation: Below normal speed for age/gender General Gait Details: Moderately antalgic gait pattern, pt having a hard time putting her foot flat (up  on her toes during stance).  Pt moves slowly and fatigues quickly.  She was able to make it out into the hallway.           Balance Overall balance assessment: Needs assistance Sitting-balance support: Feet supported;No upper extremity supported Sitting balance-Leahy Scale: Good     Standing balance support: Bilateral upper extremity supported Standing balance-Leahy Scale: Fair Standing balance comment: Can stand statically leaning trunk for support to wash hands                    Cognition Arousal/Alertness: Awake/alert Behavior During Therapy: WFL for tasks assessed/performed Overall Cognitive Status: Within Functional Limits for tasks assessed                         General Comments General comments (skin integrity, edema, etc.): CPM replaced.  Settings were painful.  Nursing adjusted and gave pain meds.      Pertinent Vitals/Pain Pain Assessment: 0-10 Pain Score: 8  Faces Pain Scale: Hurts even more Pain Location: right knee Pain Descriptors / Indicators: Aching;Burning;Grimacing;Guarding Pain Intervention(s): Limited activity within patient's tolerance;Monitored during session;Repositioned    Home Living Family/patient expects to be discharged to:: Private residence Living Arrangements: Spouse/significant other Available Help at Discharge: Family;Available 24 hours/day Type of Home: House Home Access: Stairs to enter Entrance Stairs-Rails: Right;Left Home Layout: Two level;Able to live on main level with bedroom/bathroom Home Equipment: Gilford Rile - 2 wheels;Walker - 4 wheels;Bedside commode Additional Comments: Pt may or may not want a tub bench    Prior Function Level of  Independence: Independent      Comments: Active. Drives. Attends exercise classes   PT Goals (current goals can now be found in the care plan section) Acute Rehab PT Goals Patient Stated Goal: To return to exercise class Progress towards PT goals: Progressing toward goals     Frequency    7X/week      PT Plan Current plan remains appropriate       End of Session Equipment Utilized During Treatment: Gait belt;Right knee immobilizer Activity Tolerance: Patient limited by pain;Patient limited by fatigue Patient left: in bed;with call bell/phone within reach;with family/visitor present   PT Visit Diagnosis: Muscle weakness (generalized) (M62.81);Difficulty in walking, not elsewhere classified (R26.2);Pain Pain - Right/Left: Right Pain - part of body: Knee     Time: [id number] PT Time Calculation (min) (ACUTE ONLY): 26 min  Charges:  $Gait Training: 8-22 mins $Therapeutic Activity: 8-22 mins                       Bradly Sangiovanni B. [place], [place], DPT [phone]   10/18/2016, 12:51 PM

## 2016-10-18 NOTE — Evaluation (Signed)
Occupational Therapy Evaluation Patient Details Name: Gwendolyn King MRN: [id number] DOB: 1941-01-26 Today's Date: 10/18/2016    History of Present Illness Patient is a 76 yo female admitted 10/17/16 now s/p Rt TKA.    PMH:  HLD, HTN, asthma, arthritis   Clinical Impression   Pt admitted with the above diagnosis and has the deficits listed below. Pt would benefit from cont OT to improve independence with adl transfers;specifically tub transfers.  Pt would benefit from tub bench to be fully independent with transfers but has not practiced without one.  Pt to decide tomorrow if bench is preferable.  Pt with supportive family and should do well at home.    Follow Up Recommendations  Home health OT;Supervision/Assistance - 24 hour    Equipment Recommendations  Tub/shower bench;Other (comment) (pt deciding on tub bench)    Recommendations for Other Services       Precautions / Restrictions Precautions Precautions: Knee Precaution Comments: Reviewed precautions with patient and husband Required Braces or Orthoses: Knee Immobilizer - Right Knee Immobilizer - Right: On when out of bed or walking Restrictions Weight Bearing Restrictions: Yes RLE Weight Bearing: Weight bearing as tolerated      Mobility Bed Mobility Overal bed mobility: Needs Assistance Bed Mobility: Supine to Sit;Sit to Supine     Supine to sit: Min assist;HOB elevated Sit to supine: Min assist   General bed mobility comments: Instructed pt how to donn and doff brace. Pt still requires assist to donn brace.  Transfers Overall transfer level: Needs assistance Equipment used: Rolling walker (2 wheeled) Transfers: Sit to/from Stand Sit to Stand: Min assist         General transfer comment: VCs for hand placement when sitting    Balance Overall balance assessment: Needs assistance Sitting-balance support: Feet supported Sitting balance-Leahy Scale: Good     Standing balance support: Bilateral upper  extremity supported;During functional activity Standing balance-Leahy Scale: Fair Standing balance comment: Requires walker to stand but could let go with one hand at a time for short periods of time.                            ADL Overall ADL's : Needs assistance/impaired Eating/Feeding: Independent;Sitting   Grooming: Supervision/safety;Standing   Upper Body Bathing: Set up;Sitting   Lower Body Bathing: Minimal assistance;Sit to/from stand   Upper Body Dressing : Set up;Sitting   Lower Body Dressing: Moderate assistance;Sit to/from stand   Toilet Transfer: Min guard;RW   Toileting- Water quality scientist and Hygiene: Supervision/safety;Sitting/lateral Chartered certified accountant Details (indicate cue type and reason): discussed at length and demonstrated. Pt with pain so deferred attemtping.  Will practice in session tomorrow if able. Functional mobility during ADLs: Minimal assistance;Rolling walker General ADL Comments: Pt doing very well with adls.  Requires most assist with LE dressing and needs to practice tub transfers.  Should be safe at home.     Vision         Perception     Praxis      Pertinent Vitals/Pain Pain Assessment: Faces Faces Pain Scale: Hurts even more Pain Location: Rt knee Pain Descriptors / Indicators: Aching;Sore Pain Intervention(s): Limited activity within patient's tolerance;Premedicated before session;Monitored during session;Patient requesting pain meds-RN notified;Repositioned;Ice applied     Hand Dominance Right   Extremity/Trunk Assessment Upper Extremity Assessment Upper Extremity Assessment: Overall WFL for tasks assessed   Lower Extremity Assessment Lower Extremity Assessment: Defer to PT evaluation  Cervical / Trunk Assessment Cervical / Trunk Assessment: Normal   Communication Communication Communication: No difficulties   Cognition Arousal/Alertness: Awake/alert Behavior During Therapy: WFL for tasks  assessed/performed Overall Cognitive Status: Within Functional Limits for tasks assessed                     General Comments  CPM replaced.  Settings were painful.  Nursing adjusted and gave pain meds.    Exercises       Shoulder Instructions      Home Living Family/patient expects to be discharged to:: Private residence Living Arrangements: Spouse/significant other Available Help at Discharge: Family;Available 24 hours/day Type of Home: House Home Access: Stairs to enter [employer] of Steps: 4 Entrance Stairs-Rails: Right;Left Home Layout: Two level;Able to live on main level with bedroom/bathroom     Bathroom Shower/Tub: Tub/shower unit;Curtain Shower/tub characteristics: Architectural technologist: Handicapped height     Home Equipment: Environmental consultant - 2 wheels;Walker - 4 wheels;Bedside commode   Additional Comments: Pt may or may not want a tub bench      Prior Functioning/Environment Level of Independence: Independent        Comments: Active. Drives. Attends exercise classes        OT Problem List: Impaired balance (sitting and/or standing);Decreased knowledge of use of DME or AE;Pain      OT Treatment/Interventions: Self-care/ADL training;DME and/or AE instruction;Therapeutic activities    OT Goals(Current goals can be found in the care plan section) Acute Rehab OT Goals Patient Stated Goal: To return to exercise class OT Goal Formulation: With patient Time For Goal Achievement: 11/01/16 Potential to Achieve Goals: Good ADL Goals Pt Will Perform Tub/Shower Transfer: Tub transfer;with min assist;ambulating;tub bench;rolling walker (pt deciding on bench vs. no bench) Additional ADL Goal #1: Pt will walk to bathroom with walker and toilet on 3:1 over commode with S,.  OT Frequency: Min 2X/week   Barriers to D/C:            Co-evaluation              End of Session CPM Right Knee CPM Right Knee: On Right Knee Flexion (Degrees):  60 Nurse Communication: Mobility status;Other (comment) (CPM painful)  Activity Tolerance: Patient tolerated treatment well Patient left: in CPM;in bed;with call bell/phone within reach;with nursing/sitter in room;with family/visitor present  OT Visit Diagnosis: Unsteadiness on feet (R26.81)                ADL either performed or assessed with clinical judgement  Time: 1150-1227 OT Time Calculation (min): 37 min Charges:  OT General Charges $OT Visit: 1 Procedure OT Evaluation $OT Eval Moderate Complexity: 1 Procedure OT Treatments $Self Care/Home Management : 8-22 mins G-Codes:     Jinger Neighbors, OTR/L  Glenford Peers 10/18/2016, 12:46 PM

## 2016-10-18 NOTE — Progress Notes (Signed)
Subjective: 1 Day Post-Op Procedure(s) (LRB): TOTAL KNEE ARTHROPLASTY (Right)  Activity level:  wbat Diet tolerance:  ok Voiding:  ok Patient reports pain as mild.    Objective: Vital signs in last 24 hours: Temp:  [97.3 F (36.3 C)-100.5 F (38.1 C)] 100.1 F (37.8 C) (02/28 0400) Pulse Rate:  [53-76] 76 (02/28 0400) Resp:  [8-16] 16 (02/28 0400) BP: (94-142)/(43-65) 141/49 (02/28 0400) SpO2:  [95 %-100 %] 98 % (02/28 0400)  Labs: No results for input(s): HGB in the last 72 hours. No results for input(s): WBC, RBC, HCT, PLT in the last 72 hours. No results for input(s): NA, K, CL, CO2, BUN, CREATININE, GLUCOSE, CALCIUM in the last 72 hours. No results for input(s): LABPT, INR in the last 72 hours.  Physical Exam:  Neurologically intact ABD soft Neurovascular intact Sensation intact distally Intact pulses distally Dorsiflexion/Plantar flexion intact Incision: dressing C/D/I and no drainage No cellulitis present Compartment soft  Assessment/Plan:  1 Day Post-Op Procedure(s) (LRB): TOTAL KNEE ARTHROPLASTY (Right) Advance diet Up with therapy D/C IV fluids Plan for discharge tomorrow Discharge home with home health if doing well and cleared by PT. Follow up in office 2 weeks post op. Continue on ASA 325mg  BID x 2 weeks post op. I will change dressing to aquacel tomorrow.  Gwendolyn King, Larwance Sachs 10/18/2016, 7:56 AM

## 2016-10-19 MED ORDER — METHOCARBAMOL 500 MG PO TABS: 500.0000 mg | ORAL_TABLET | Freq: Four times a day (QID) | ORAL | 0 refills | Status: DC | PRN

## 2016-10-19 MED ORDER — HYDROCODONE-ACETAMINOPHEN 5-325 MG PO TABS: 1.0000 | ORAL_TABLET | ORAL | 0 refills | Status: DC | PRN

## 2016-10-19 MED ORDER — BISACODYL 5 MG PO TBEC: 5.0000 mg | DELAYED_RELEASE_TABLET | Freq: Every day | ORAL | 0 refills | Status: DC | PRN

## 2016-10-19 MED ORDER — DOCUSATE SODIUM 100 MG PO CAPS: 100.0000 mg | ORAL_CAPSULE | Freq: Two times a day (BID) | ORAL | 0 refills | Status: DC

## 2016-10-19 MED ORDER — ASPIRIN 325 MG PO TBEC: 325.0000 mg | DELAYED_RELEASE_TABLET | Freq: Two times a day (BID) | ORAL | 0 refills | Status: DC

## 2016-10-19 NOTE — Progress Notes (Signed)
Occupational Therapy Treatment Patient Details Name: Gwendolyn King MRN: [id number] DOB: 1941-07-04 Today's Date: 10/19/2016    History of present illness Patient is a 76 yo female admitted 10/17/16 now s/p Rt TKA.    PMH:  HLD, HTN, asthma, arthritis   OT comments  Pt seen for additional session to complete education with pt/husband regarding bathroom mobility/safe tub transfers and compensatory techniqeus for ADL. Also educated pt/husband on reducing risk of falls at home. Pt/husband verbalized understanding. Pt safe to DC home when medically stable.   Follow Up Recommendations  Home health OT;Supervision/Assistance - 24 hour (initially)    Equipment Recommendations  None recommended by OT    Recommendations for Other Services      Precautions / Restrictions Precautions Precautions: Knee Precaution Booklet Issued: Yes (comment) Precaution Comments: Reviewed precautions with patient Required Braces or Orthoses: Knee Immobilizer - Right Knee Immobilizer - Right: On when out of bed or walking Restrictions Weight Bearing Restrictions: Yes RLE Weight Bearing: Weight bearing as tolerated       Mobility Bed Mobility Overal bed mobility: Needs Assistance Bed Mobility: Supine to Sit     Supine to sit: Min assist Sit to supine: Min assist   General bed mobility comments: Rt leg management  Transfers Overall transfer level: Needs assistance Equipment used: Rolling walker (2 wheeled) Transfers: Sit to/from Stand Sit to Stand: Supervision Stand pivot transfers: Supervision       General transfer comment: good carry over from earlier session    Balance Overall balance assessment: Needs assistance Sitting-balance support: No upper extremity supported;Feet supported Sitting balance-Leahy Scale: Good Sitting balance - Comments: able to perform perianal hygeine supervision   Standing balance support: Bilateral upper extremity supported Standing balance-Leahy Scale:  Fair Standing balance comment: performed washing hands at sink                   ADL Overall ADL's : Needs assistance/impaired                     Lower Body Dressing: Minimal assistance;Sit to/from stand   Toilet Transfer: Supervision/safety;RW;BSC   Toileting- Water quality scientist and Hygiene: Engineer, site Details (indicate cue type and reason): Demonstrated options for tub transfers including facing 3 in 1 out of tub so pt can back up to tub, sit adn rest RLE on stool. Also demonstrated tub trasnfer technique stepping backwareds over tub with LLE first then sitting on 3 in1 then bringing RLE over tub. Pt verbalized understanding. Recommended pt wither sponge bath or use first option to reduce risk of falls until she is more stable in 1 leg stance.  Functional mobility during ADLs: Supervision/safety;Rolling walker General ADL Comments: Pt asking to educate husband on tub transfer techniques and management of her RLE during ADL tasks. Demosntrated donning/doffing KI. Pt/husband verbalized understnading. Pt has 3in1 - recommended using it bedside initially until pt can ambulate safely without KI to bathroom. Completed tub transfer education wtih husbnad. Husband verbalized understanding.       Vision                     Perception     Praxis      Cognition   Behavior During Therapy: [facility] for tasks assessed/performed Overall Cognitive Status: Within Functional Limits for tasks assessed                         Exercises  Shoulder Instructions       General Comments      Pertinent Vitals/ Pain       Pain Assessment: 0-10 Pain Score: 5  Pain Location: right knee Pain Descriptors / Indicators: Aching;Sore;Tightness Pain Intervention(s): Limited activity within patient's tolerance  Home Living                                          Prior Functioning/Environment               Frequency  Min 2X/week        Progress Toward Goals  OT Goals(current goals can now be found in the care plan section)  Progress towards OT goals: Progressing toward goals  Acute Rehab OT Goals Patient Stated Goal: to be safe and not fall OT Goal Formulation: With patient Time For Goal Achievement: 11/01/16 Potential to Achieve Goals: Good ADL Goals Pt Will Perform Tub/Shower Transfer: Tub transfer;with min assist;ambulating;tub bench;rolling walker Additional ADL Goal #1: Pt will walk to bathroom with walker and toilet on 3:1 over commode with S,.  Plan Discharge plan remains appropriate    Co-evaluation                 End of Session Equipment Utilized During Treatment: Rolling walker;Right knee immobilizer CPM Right Knee CPM Right Knee: Off  OT Visit Diagnosis: Unsteadiness on feet (R26.81)   Activity Tolerance Patient tolerated treatment well   Patient Left Other (comment) (on toilet)   Nurse Communication Mobility status;Other (comment) (pt in bathroom)        Time: [id number] OT Time Calculation (min): 18 min  Charges: OT General Charges $OT Visit: 1 Procedure OT Treatments $Self Care/Home Management : 8-22 mins  [facility], OT/L  [id number] 10/19/2016   Shaira Sova,HILLARY 10/19/2016, 4:36 PM

## 2016-10-19 NOTE — Discharge Summary (Signed)
Patient ID: Gwendolyn King MRN: [id number] DOB/AGE: 74-Jul-1942 74 y.o.  Admit date: 10/17/2016 Discharge date: 10/19/2016  Admission Diagnoses:  Principal Problem:   Primary localized osteoarthritis of right knee Active Problems:   Primary osteoarthritis of right knee   Discharge Diagnoses:  Same  Past Medical History:  Diagnosis Date  . Allergy    hay fever, fall allergies  . Arthritis   . Asthma    as a child, dad was smoker  . Chicken pox   . Colon polyp   . Constipation   . Hyperlipidemia   . Hypothyroidism   . Palpitations   . Thyroid disease     Surgeries: Procedure(s): TOTAL KNEE ARTHROPLASTY on 10/17/2016   Consultants:   Discharged Condition: Improved  Hospital Course: SAMREEN King is an 76 y.o. female who was admitted 10/17/2016 for operative treatment ofPrimary localized osteoarthritis of right knee. Patient has severe unremitting pain that affects sleep, daily activities, and work/hobbies. After pre-op clearance the patient was taken to the operating room on 10/17/2016 and underwent  Procedure(s): TOTAL KNEE ARTHROPLASTY.    Patient was given perioperative antibiotics: Anti-infectives    Start     Dose/Rate Route Frequency Ordered Stop   10/17/16 1330  ceFAZolin (ANCEF) IVPB 2g/100 mL premix     2 g 200 mL/hr over 30 Minutes Intravenous Every 6 hours 10/17/16 1103 10/17/16 2306   10/17/16 0633  ceFAZolin (ANCEF) IVPB 2g/100 mL premix     2 g 200 mL/hr over 30 Minutes Intravenous On call to O.R. 10/17/16 [id number] 10/17/16 0736   10/17/16 0532  ceFAZolin (ANCEF) 2-4 GM/100ML-% IVPB    Comments:  Starleen Arms   : cabinet override      10/17/16 0532 10/17/16 0736       Patient was given sequential compression devices, early ambulation, and chemoprophylaxis to prevent DVT.  Patient benefited maximally from hospital stay and there were no complications.    Recent vital signs: Patient Vitals for the past 24 hrs:  BP Temp Temp src Pulse Resp SpO2  10/19/16  0700 (!) 148/60 98.8 F (37.1 C) Axillary 71 - 98 %  10/18/16 2043 (!) 151/51 99.3 F (37.4 C) Oral 69 - 98 %  10/18/16 1426 (!) 177/64 98.6 F (37 C) Oral - 18 98 %     Recent laboratory studies: No results for input(s): WBC, HGB, HCT, PLT, NA, K, CL, CO2, BUN, CREATININE, GLUCOSE, INR, CALCIUM in the last 72 hours.  Invalid input(s): PT, 2   Discharge Medications:   Allergies as of 10/19/2016      Reactions   Lipitor [atorvastatin]    Myalgia, arthralgia   Simvastatin    myalgia      Medication List    STOP taking these medications   aspirin 81 MG tablet Replaced by:  aspirin 325 MG EC tablet   ibuprofen 200 MG tablet Commonly known as:  ADVIL,MOTRIN     TAKE these medications   acebutolol 200 MG capsule Commonly known as:  SECTRAL Take 1 capsule (200 mg total) by mouth daily. What changed:  when to take this   acetaminophen 500 MG tablet Commonly known as:  TYLENOL Take 1,000 mg by mouth at bedtime.   aspirin 325 MG EC tablet Take 1 tablet (325 mg total) by mouth 2 (two) times daily after a meal. Replaces:  aspirin 81 MG tablet   BIOTIN 5000 5 MG Caps Generic drug:  Biotin Take 1 capsule by mouth daily.   bisacodyl 5  MG EC tablet Commonly known as:  DULCOLAX Take 1 tablet (5 mg total) by mouth daily as needed for moderate constipation.   docusate sodium 100 MG capsule Commonly known as:  COLACE Take 1 capsule (100 mg total) by mouth 2 (two) times daily.   fexofenadine 180 MG tablet Commonly known as:  ALLEGRA Take 180 mg by mouth every evening.   furosemide 20 MG tablet Commonly known as:  LASIX Take 1 tablet (20 mg total) by mouth daily. What changed:  when to take this  reasons to take this   HYDROcodone-acetaminophen 5-325 MG tablet Commonly known as:  NORCO/VICODIN Take 1-2 tablets by mouth every 4 (four) hours as needed (breakthrough pain).   KRILL OIL PO Take 1 capsule by mouth daily.   LEVOXYL 75 MCG tablet Generic drug:   levothyroxine TAKE 1 TABLET ONE TIME DAILY What changed:  how much to take  how to take this  when to take this  additional instructions   methocarbamol 500 MG tablet Commonly known as:  ROBAXIN Take 1 tablet (500 mg total) by mouth every 6 (six) hours as needed for muscle spasms.   polyethylene glycol packet Commonly known as:  MIRALAX / GLYCOLAX Take 17 g by mouth daily.   Vitamin D-3 1000 units Caps Take 1,000 Units by mouth every evening.            Durable Medical Equipment        Start     Ordered   10/17/16 1331  DME Walker rolling  Once    Question:  Patient needs a walker to treat with the following condition  Answer:  Primary osteoarthritis of right knee   10/17/16 1331   10/17/16 1331  DME 3 n 1  Once     10/17/16 1331   10/17/16 1331  DME Bedside commode  Once    Question:  Patient needs a bedside commode to treat with the following condition  Answer:  Primary osteoarthritis of right knee   10/17/16 1331      Diagnostic Studies: Dg Chest 2 View  Result Date: 10/09/2016 CLINICAL DATA:  Preoperative study prior to knee replacement. EXAM: CHEST  2 VIEW COMPARISON:  None. FINDINGS: The heart size and mediastinal contours are within normal limits. Both lungs are clear. The visualized skeletal structures are unremarkable. IMPRESSION: No active cardiopulmonary disease. Electronically Signed   By: Dorise Bullion III M.D   On: 10/09/2016 20:55    Disposition: Final discharge disposition not confirmed  Discharge Instructions    Call MD / Call 911    Complete by:  As directed    If you experience chest pain or shortness of breath, CALL 911 and be transported to the hospital emergency room.  If you develope a fever above 101 F, pus (white drainage) or increased drainage or redness at the wound, or calf pain, call your surgeon's office.   Constipation Prevention    Complete by:  As directed    Drink plenty of fluids.  Prune juice may be helpful.  You may use a  stool softener, such as Colace (over the counter) 100 mg twice a day.  Use MiraLax (over the counter) for constipation as needed.   Diet - low sodium heart healthy    Complete by:  As directed    Discharge instructions    Complete by:  As directed    INSTRUCTIONS AFTER JOINT REPLACEMENT   Remove items at home which could result in a fall. This  includes throw rugs or furniture in walking pathways ICE to the affected joint every three hours while awake for 30 minutes at a time, for at least the first 3-5 days, and then as needed for pain and swelling.  Continue to use ice for pain and swelling. You may notice swelling that will progress down to the foot and ankle.  This is normal after surgery.  Elevate your leg when you are not up walking on it.   Continue to use the breathing machine you got in the hospital (incentive spirometer) which will help keep your temperature down.  It is common for your temperature to cycle up and down following surgery, especially at night when you are not up moving around and exerting yourself.  The breathing machine keeps your lungs expanded and your temperature down.   DIET:  As you were doing prior to hospitalization, we recommend a well-balanced diet.  DRESSING / WOUND CARE / SHOWERING  You may shower 3 days after surgery, but keep the wounds dry during showering.  You may use an occlusive plastic wrap (Press'n Seal for example), NO SOAKING/SUBMERGING IN THE BATHTUB.  If the bandage gets wet, change with a clean dry gauze.  If the incision gets wet, pat the wound dry with a clean towel.  ACTIVITY  Increase activity slowly as tolerated, but follow the weight bearing instructions below.   No driving for 6 weeks or until further direction given by your physician.  You cannot drive while taking narcotics.  No lifting or carrying greater than 10 lbs. until further directed by your surgeon. Avoid periods of inactivity such as sitting longer than an hour when not  asleep. This helps prevent blood clots.  You may return to work once you are authorized by your doctor.     WEIGHT BEARING   Weight bearing as tolerated with assist device (walker, cane, etc) as directed, use it as long as suggested by your surgeon or therapist, typically at least 4-6 weeks.   EXERCISES  Results after joint replacement surgery are often greatly improved when you follow the exercise, range of motion and muscle strengthening exercises prescribed by your doctor. Safety measures are also important to protect the joint from further injury. Any time any of these exercises cause you to have increased pain or swelling, decrease what you are doing until you are comfortable again and then slowly increase them. If you have problems or questions, call your caregiver or physical therapist for advice.   Rehabilitation is important following a joint replacement. After just a few days of immobilization, the muscles of the leg can become weakened and shrink (atrophy).  These exercises are designed to build up the tone and strength of the thigh and leg muscles and to improve motion. Often times heat used for twenty to thirty minutes before working out will loosen up your tissues and help with improving the range of motion but do not use heat for the first two weeks following surgery (sometimes heat can increase post-operative swelling).   These exercises can be done on a training (exercise) mat, on the floor, on a table or on a bed. Use whatever works the best and is most comfortable for you.    Use music or television while you are exercising so that the exercises are a pleasant break in your day. This will make your life better with the exercises acting as a break in your routine that you can look forward to.   Perform all exercises  about fifteen times, three times per day or as directed.  You should exercise both the operative leg and the other leg as well.   Exercises include:   Quad Sets -  Tighten up the muscle on the front of the thigh (Quad) and hold for 5-10 seconds.   Straight Leg Raises - With your knee straight (if you were given a brace, keep it on), lift the leg to 60 degrees, hold for 3 seconds, and slowly lower the leg.  Perform this exercise against resistance later as your leg gets stronger.  Leg Slides: Lying on your back, slowly slide your foot toward your buttocks, bending your knee up off the floor (only go as far as is comfortable). Then slowly slide your foot back down until your leg is flat on the floor again.  Angel Wings: Lying on your back spread your legs to the side as far apart as you can without causing discomfort.  Hamstring Strength:  Lying on your back, push your heel against the floor with your leg straight by tightening up the muscles of your buttocks.  Repeat, but this time bend your knee to a comfortable angle, and push your heel against the floor.  You may put a pillow under the heel to make it more comfortable if necessary.   A rehabilitation program following joint replacement surgery can speed recovery and prevent re-injury in the future due to weakened muscles. Contact your doctor or a physical therapist for more information on knee rehabilitation.    CONSTIPATION  Constipation is defined medically as fewer than three stools per week and severe constipation as less than one stool per week.  Even if you have a regular bowel pattern at home, your normal regimen is likely to be disrupted due to multiple reasons following surgery.  Combination of anesthesia, postoperative narcotics, change in appetite and fluid intake all can affect your bowels.   YOU MUST use at least one of the following options; they are listed in order of increasing strength to get the job done.  They are all available over the counter, and you may need to use some, POSSIBLY even all of these options:    Drink plenty of fluids (prune juice may be helpful) and high fiber  foods Colace 100 mg by mouth twice a day  Senokot for constipation as directed and as needed Dulcolax (bisacodyl), take with full glass of water  Miralax (polyethylene glycol) once or twice a day as needed.  If you have tried all these things and are unable to have a bowel movement in the first 3-4 days after surgery call either your surgeon or your primary doctor.    If you experience loose stools or diarrhea, hold the medications until you stool forms back up.  If your symptoms do not get better within 1 week or if they get worse, check with your doctor.  If you experience "the worst abdominal pain ever" or develop nausea or vomiting, please contact the office immediately for further recommendations for treatment.   ITCHING:  If you experience itching with your medications, try taking only a single pain pill, or even half a pain pill at a time.  You can also use Benadryl over the counter for itching or also to help with sleep.   TED HOSE STOCKINGS:  Use stockings on both legs until for at least 2 weeks or as directed by physician office. They may be removed at night for sleeping.  MEDICATIONS:  See your medication  summary on the "After Visit Summary" that nursing will review with you.  You may have some home medications which will be placed on hold until you complete the course of blood thinner medication.  It is important for you to complete the blood thinner medication as prescribed.  PRECAUTIONS:  If you experience chest pain or shortness of breath - call 911 immediately for transfer to the hospital emergency department.   If you develop a fever greater that 101 F, purulent drainage from wound, increased redness or drainage from wound, foul odor from the wound/dressing, or calf pain - CONTACT YOUR SURGEON.                                                   FOLLOW-UP APPOINTMENTS:  If you do not already have a post-op appointment, please call the office for an appointment to be seen by your  surgeon.  Guidelines for how soon to be seen are listed in your "After Visit Summary", but are typically between 1-4 weeks after surgery.  OTHER INSTRUCTIONS:   Knee Replacement:  Do not place pillow under knee, focus on keeping the knee straight while resting. CPM instructions: 0-90 degrees, 2 hours in the morning, 2 hours in the afternoon, and 2 hours in the evening. Place foam block, curve side up under heel at all times except when in CPM or when walking.  DO NOT modify, tear, cut, or change the foam block in any way.  MAKE SURE YOU:  Understand these instructions.  Get help right away if you are not doing well or get worse.    Thank you for letting us be a part of your medical care team.  It is a privilege we respect greatly.  We hope these instructions will help you stay on track for a fast and full recovery!   Increase activity slowly as tolerated    Complete by:  As directed       Follow-up Information    DALLDORF,PETER G, MD. Schedule an appointment as soon as possible for a visit in 2 week(s).   Specialty:  Orthopedic Surgery Contact information: [place] [address] [facility].   Why:  Someone from [facility] will contact you to arrange start date and time for therapy Contact information: [address] [place] [address] [phone]            Signed: Rich Fuchs 10/19/2016, 2:19 PM

## 2016-10-19 NOTE — Care Management Note (Signed)
Case Management Note  Patient Details  Name: Gwendolyn King MRN: [id number] Date of Birth: 1941-02-28  Subjective/Objective:   76 yr old female s/p right total knee arthroplasty.         Action/Plan: patient was preoperatively setup with [place], no changes. Has RW and 3in1. CPM has been delivered to her home. Patient will have family support at discharge.    Expected Discharge Date:   10/20/16               Expected Discharge Plan:  [place]  In-House Referral:  NA  Discharge planning Services  CM Consult  Post Acute Care Choice:  Durable Medical Equipment, Home Health Choice offered to:  Patient  DME Arranged:  CPM DME Agency:  [employer]  HH Arranged:  PT HH Agency:  [facility]  Status of Service:  Completed, signed off  If discussed at [place] of Stay Meetings, dates discussed:    Additional Comments:  Ninfa Meeker, RN 10/19/2016, 12:26 PM

## 2016-10-19 NOTE — Progress Notes (Signed)
Physical Therapy Treatment Patient Details Name: Gwendolyn King MRN: [id number] DOB: January 24, 1941 Today's Date: 10/19/2016    History of Present Illness Patient is a 76 yo female admitted 10/17/16 now s/p Rt TKA.    PMH:  HLD, HTN, asthma, arthritis    PT Comments    Pt progressing with mobility.  Husband present during session and educated on mobility, positioning precautions and HEP.  He gave return demo of [place].  Pt's mobility adequate for discharge home with family assist.    Follow Up Recommendations  Home health PT;Supervision for mobility/OOB     Equipment Recommendations  None recommended by PT    Recommendations for Other Services       Precautions / Restrictions Precautions Precautions: Knee Precaution Booklet Issued: Yes (comment) Precaution Comments: Reviewed precautions with patient Required Braces or Orthoses: Knee Immobilizer - Right Knee Immobilizer - Right: On when out of bed or walking Restrictions Weight Bearing Restrictions: Yes RLE Weight Bearing: Weight bearing as tolerated    Mobility  Bed Mobility Overal bed mobility: Needs Assistance       Supine to sit: Min assist Sit to supine: Min assist   General bed mobility comments: Rt leg management  Transfers Overall transfer level: Needs assistance Equipment used: Rolling walker (2 wheeled) Transfers: Sit to/from Stand Sit to Stand: Supervision Stand pivot transfers: Min guard       General transfer comment: good transfer safety  Ambulation/Gait Ambulation/Gait assistance: Min guard Ambulation Distance (Feet): 110 Feet Assistive device: Rolling walker (2 wheeled) Gait Pattern/deviations: Step-through pattern;Decreased stance time - right;Decreased weight shift to right;Antalgic     General Gait Details: min cues for walker placement (tends to stand too anteriorly)   Stairs Stairs: Yes   Stair Management: Two rails;Step to pattern;Forwards Number of Stairs: 3 General stair  comments: min demo cues, demo to pt alternative sideways technique  Wheelchair Mobility    Modified Rankin (Stroke Patients Only)       Balance Overall balance assessment: Needs assistance Sitting-balance support: No upper extremity supported;Feet supported Sitting balance-Leahy Scale: Good Sitting balance - Comments: able to perform perianal hygeine supervision   Standing balance support: Bilateral upper extremity supported Standing balance-Leahy Scale: Fair Standing balance comment: performed washing hands at sink                    Cognition Arousal/Alertness: Awake/alert Behavior During Therapy: WFL for tasks assessed/performed Overall Cognitive Status: Within Functional Limits for tasks assessed                      Exercises Total Joint Exercises Ankle Circles/Pumps: AROM;Right;10 reps;Supine Quad Sets: AROM;Right;10 reps;Supine Towel Squeeze: AROM;Both;10 reps Short Arc QuadSinclair Ship;Right;10 reps;Supine Heel Slides: AAROM;Right;10 reps;Supine Hip ABduction/ADduction: AAROM;Right;10 reps;Supine Straight Leg Raises: AAROM;Right;10 reps;Supine Goniometric ROM: 5 degrees from full ext, to 45 degrees flexion    General Comments General comments (skin integrity, edema, etc.): applied ice at end of session to manage swelling/pain      Pertinent Vitals/Pain Pain Assessment: 0-10 Pain Score: 5  Pain Location: right knee Pain Descriptors / Indicators: Aching;Sore;Tightness Pain Intervention(s): Limited activity within patient's tolerance;Monitored during session;Patient requesting pain meds-RN notified    Home Living                      Prior Function            PT Goals (current goals can now be found in the care plan section) Acute Rehab  PT Goals Patient Stated Goal: manage pain PT Goal Formulation: With patient/family Time For Goal Achievement: 10/24/16 Potential to Achieve Goals: Good Progress towards PT goals: Progressing toward  goals    Frequency    7X/week      PT Plan Current plan remains appropriate    Co-evaluation             End of Session Equipment Utilized During Treatment: Gait belt;Right knee immobilizer Activity Tolerance: No increased pain;Patient tolerated treatment well Patient left: in bed;with call bell/phone within reach;with family/visitor present Nurse Communication: Mobility status PT Visit Diagnosis: Muscle weakness (generalized) (M62.81);Difficulty in walking, not elsewhere classified (R26.2);Pain Pain - Right/Left: Right Pain - part of body: Knee     Time: [id number] PT Time Calculation (min) (ACUTE ONLY): 39 min  Charges:  $Gait Training: 8-22 mins $Therapeutic Exercise: 23-37 mins                    G CodesSuszanne Finch, PT [phone]  Aviv Lengacher 10/19/2016, 4:14 PM

## 2016-10-19 NOTE — Progress Notes (Signed)
Orthopedic Tech Progress Note Patient Details:  Gwendolyn King [birth year] [id number]  CPM Right Knee CPM Right Knee: On Right Knee Flexion (Degrees): 60 Right Knee Extension (Degrees): 0 Additional Comments: trapeze bar helper   [place] Pink 10/19/2016, 3:20 PM

## 2016-10-19 NOTE — Progress Notes (Signed)
Occupational Therapy Treatment Patient Details Name: Gwendolyn King MRN: [id number] DOB: [birth year] Today's Date: 10/19/2016    History of present illness Patient is a 76 yo female admitted 10/17/16 now s/p Rt TKA.    PMH:  HLD, HTN, asthma, arthritis   OT comments  Pt making good progress. Demonstrated tub transfer techniques using 3 in 1. Will continue to follow acutely to facilitate safe DC home with husband.   Follow Up Recommendations  Home health OT;Supervision/Assistance - 24 hour    Equipment Recommendations  None recommended by OT    Recommendations for Other Services      Precautions / Restrictions Precautions Precautions: Knee Precaution Booklet Issued: Yes (comment) Precaution Comments: Reviewed precautions with patient Required Braces or Orthoses: Knee Immobilizer - Right Knee Immobilizer - Right: On when out of bed or walking Restrictions Weight Bearing Restrictions: Yes RLE Weight Bearing: Weight bearing as tolerated       Mobility Bed Mobility Overal bed mobility: Needs Assistance       Supine to sit: Min assist     General bed mobility comments: demonstrated how to use LLE to advance RLE off bed.   Transfers Overall transfer level: Needs assistance Equipment used: Rolling walker (2 wheeled) Transfers: Sit to/from Stand Sit to Stand: Supervision Stand pivot transfers: Min guard       General transfer comment: min cues for hand placement and Rt leg placement    Balance Overall balance assessment: Needs assistance Sitting-balance support: Feet supported;No upper extremity supported Sitting balance-Leahy Scale: Good Sitting balance - Comments: able to perform perianal hygeine supervision   Standing balance support: No upper extremity supported Standing balance-Leahy Scale: Fair Standing balance comment: performed washing hands at sink                   ADL Overall ADL's : Needs assistance/impaired                     Lower  Body Dressing: Minimal assistance;Sit to/from stand   Toilet Transfer: Supervision/safety;RW;BSC   Toileting- Water quality scientist and Hygiene: Engineer, site Details (indicate cue type and reason): Demonstrated options for tub transfers including facing 3 in 1 out of tub so pt can back up to tub, sit adn rest RLE on stool. Also demosntrated tub trasnfer technique stepping backwareds over tub with LLE first then sitting on 3 in1 then bringing RLE over tub. Pt verbalized understanding. Recommended pt wither sponge bath or use first option to reduce risk of falls until she is more stable in 1 leg stance.           Vision                     Perception     Praxis      Cognition   Behavior During Therapy: [facility] for tasks assessed/performed Overall Cognitive Status: Within Functional Limits for tasks assessed                         Exercises Total Joint Exercises Ankle Circles/Pumps: AROM;Right;10 reps;Supine Quad Sets: AROM;Right;10 reps;Supine Goniometric ROM: 5 degrees from full ext, to 45 degrees flexion   Shoulder Instructions       General Comments      Pertinent Vitals/ Pain       Pain Assessment: 0-10 Pain Score: 4  Pain Location: right knee Pain Descriptors / Indicators: Aching;Sore Pain Intervention(s): Limited activity within  patient's tolerance;Repositioned  Home Living                                          Prior Functioning/Environment              Frequency  Min 2X/week        Progress Toward Goals  OT Goals(current goals can now be found in the care plan section)  Progress towards OT goals: Progressing toward goals  Acute Rehab OT Goals Patient Stated Goal: regain strength OT Goal Formulation: With patient Time For Goal Achievement: 11/01/16 Potential to Achieve Goals: Good ADL Goals Pt Will Perform Tub/Shower Transfer: Tub transfer;with min assist;ambulating;tub  bench;rolling walker Additional ADL Goal #1: Pt will walk to bathroom with walker and toilet on 3:1 over commode with S,.  Plan Discharge plan remains appropriate    Co-evaluation                 End of Session Equipment Utilized During Treatment: Gait belt;Rolling walker CPM Right Knee CPM Right Knee: Off  OT Visit Diagnosis: Unsteadiness on feet (R26.81)   Activity Tolerance Patient tolerated treatment well   Patient Left in chair;with call bell/phone within reach   Nurse Communication Mobility status        Time: [id number] OT Time Calculation (min): 38 min  Charges: OT General Charges $OT Visit: 1 Procedure OT Treatments $Self Care/Home Management : 38-52 mins  [facility], OT/L  [id number] 10/19/2016   Krystal Teachey,HILLARY 10/19/2016, 2:35 PM

## 2016-10-19 NOTE — Progress Notes (Signed)
Physical Therapy Treatment Patient Details Name: Gwendolyn King MRN: [id number] DOB: 03/16/1941 Today's Date: 10/19/2016    History of Present Illness Patient is a 76 yo female admitted 10/17/16 now s/p Rt TKA.    PMH:  HLD, HTN, asthma, arthritis    PT Comments    Pt slowly progressing with gait distances and pain control.  Pt will have assist upon discharge home with family.  Encouraged pt on resting positions to manage her ROM.  Pt's mobility adequate for discharge.   Follow Up Recommendations  Home health PT;Supervision for mobility/OOB     Equipment Recommendations  None recommended by PT    Recommendations for Other Services       Precautions / Restrictions Precautions Precautions: Knee Precaution Booklet Issued: Yes (comment) Precaution Comments: Reviewed precautions with patient Required Braces or Orthoses: Knee Immobilizer - Right Knee Immobilizer - Right: On when out of bed or walking Restrictions Weight Bearing Restrictions: Yes RLE Weight Bearing: Weight bearing as tolerated    Mobility  Bed Mobility               General bed mobility comments: on toilet upon arrival  Transfers Overall transfer level: Needs assistance Equipment used: Rolling walker (2 wheeled) Transfers: Sit to/from Stand Sit to Stand: Min guard Stand pivot transfers: Min assist       General transfer comment: min cues for hand placement and Rt leg placement  Ambulation/Gait Ambulation/Gait assistance: Min guard Ambulation Distance (Feet): 75 Feet (x 2) Assistive device: Rolling walker (2 wheeled) Gait Pattern/deviations: Step-to pattern;Antalgic;Decreased stance time - right     General Gait Details: min cues to heel strike, gently encouraged pt to incr Rt LE WB   Stairs Stairs: Yes   Stair Management: Two rails;Step to pattern;Forwards Number of Stairs: 3 General stair comments: min demo cues, demo to pt alternative sideways technique  Wheelchair Mobility     Modified Rankin (Stroke Patients Only)       Balance Overall balance assessment: Needs assistance Sitting-balance support: Feet supported;No upper extremity supported Sitting balance-Leahy Scale: Good Sitting balance - Comments: able to perform perianal hygeine supervision   Standing balance support: No upper extremity supported Standing balance-Leahy Scale: Fair Standing balance comment: performed washing hands at sink                    Cognition Arousal/Alertness: Awake/alert Behavior During Therapy: WFL for tasks assessed/performed Overall Cognitive Status: Within Functional Limits for tasks assessed                      Exercises Total Joint Exercises Ankle Circles/Pumps: AROM;Right;10 reps;Supine Quad Sets: AROM;Right;10 reps;Supine Goniometric ROM: 5 degrees from full ext, to 45 degrees flexion    General Comments General comments (skin integrity, edema, etc.): performed toileting tasks.  1 sitting rest break between gait bouts      Pertinent Vitals/Pain Pain Assessment: 0-10 Pain Score: 4  Pain Location: right knee Pain Descriptors / Indicators: Aching;Sore Pain Intervention(s): Limited activity within patient's tolerance;Monitored during session;Premedicated before session;Ice applied    Home Living                      Prior Function            PT Goals (current goals can now be found in the care plan section) Acute Rehab PT Goals Patient Stated Goal: regain strength PT Goal Formulation: With patient Time For Goal Achievement: 10/24/16 Potential to Achieve Goals:  Good Progress towards PT goals: Progressing toward goals    Frequency    7X/week      PT Plan Current plan remains appropriate    Co-evaluation             End of Session Equipment Utilized During Treatment: Right knee immobilizer Activity Tolerance: No increased pain;Patient tolerated treatment well Patient left: in chair;with call bell/phone  within reach Nurse Communication: Mobility status PT Visit Diagnosis: Muscle weakness (generalized) (M62.81);Difficulty in walking, not elsewhere classified (R26.2);Pain Pain - Right/Left: Right Pain - part of body: Knee     Time: [id number] PT Time Calculation (min) (ACUTE ONLY): 36 min  Charges:  $Gait Training: 23-37 mins                    G CodesSuszanne Finch, PT [phone]  [place] 10/19/2016, 2:01 PM

## 2016-10-19 NOTE — Progress Notes (Signed)
Subjective: 2 Days Post-Op Procedure(s) (LRB): TOTAL KNEE ARTHROPLASTY (Right)  Activity level:  wbat Diet tolerance:  ok Voiding:  ok Patient reports pain as mild.    Objective: Vital signs in last 24 hours: Temp:  [98.6 F (37 C)-99.3 F (37.4 C)] 98.8 F (37.1 C) (03/01 0700) Pulse Rate:  [69-71] 71 (03/01 0700) Resp:  [18] 18 (02/28 1426) BP: (148-177)/(51-64) 148/60 (03/01 0700) SpO2:  [98 %] 98 % (03/01 0700)  Labs: No results for input(s): HGB in the last 72 hours. No results for input(s): WBC, RBC, HCT, PLT in the last 72 hours. No results for input(s): NA, K, CL, CO2, BUN, CREATININE, GLUCOSE, CALCIUM in the last 72 hours. No results for input(s): LABPT, INR in the last 72 hours.  Physical Exam:  Neurologically intact ABD soft Neurovascular intact Sensation intact distally Intact pulses distally Dorsiflexion/Plantar flexion intact Incision: dressing C/D/I and no drainage No cellulitis present Compartment soft  Assessment/Plan:  2 Days Post-Op Procedure(s) (LRB): TOTAL KNEE ARTHROPLASTY (Right) Advance diet Up with therapy Discharge home with home health today after PT this afternoon if doing well. I changed bandage to aquacel today. Continue on ASA 325mg  BID x 2 weeks post op. Follow up in office 2 weeks post op  Creg Gilmer, Larwance Sachs 10/19/2016, 2:19 PM

## 2016-10-19 NOTE — Progress Notes (Signed)
Discharge instructions given. Pt verbalized understanding and all questions were answered.  

## 2016-10-20 DIAGNOSIS — M1711 Unilateral primary osteoarthritis, right knee: Secondary | ICD-10-CM | POA: Diagnosis not present

## 2016-10-20 DIAGNOSIS — Z96651 Presence of right artificial knee joint: Secondary | ICD-10-CM | POA: Diagnosis not present

## 2016-10-21 DIAGNOSIS — Z471 Aftercare following joint replacement surgery: Secondary | ICD-10-CM | POA: Diagnosis not present

## 2016-10-21 DIAGNOSIS — J45909 Unspecified asthma, uncomplicated: Secondary | ICD-10-CM | POA: Diagnosis not present

## 2016-10-21 DIAGNOSIS — Z6829 Body mass index (BMI) 29.0-29.9, adult: Secondary | ICD-10-CM | POA: Diagnosis not present

## 2016-10-21 DIAGNOSIS — I10 Essential (primary) hypertension: Secondary | ICD-10-CM | POA: Diagnosis not present

## 2016-10-21 DIAGNOSIS — E669 Obesity, unspecified: Secondary | ICD-10-CM | POA: Diagnosis not present

## 2016-10-21 DIAGNOSIS — Z96651 Presence of right artificial knee joint: Secondary | ICD-10-CM | POA: Diagnosis not present

## 2016-10-30 DIAGNOSIS — M1711 Unilateral primary osteoarthritis, right knee: Secondary | ICD-10-CM | POA: Diagnosis not present

## 2016-11-13 ENCOUNTER — Encounter: Payer: Self-pay | Admitting: Family Medicine

## 2016-11-20 ENCOUNTER — Encounter: Payer: Self-pay | Admitting: Family Medicine

## 2016-11-20 ENCOUNTER — Ambulatory Visit (INDEPENDENT_AMBULATORY_CARE_PROVIDER_SITE_OTHER): Payer: PPO | Admitting: Family Medicine

## 2016-11-20 DIAGNOSIS — R002 Palpitations: Secondary | ICD-10-CM

## 2016-11-20 DIAGNOSIS — E039 Hypothyroidism, unspecified: Secondary | ICD-10-CM | POA: Diagnosis not present

## 2016-11-20 DIAGNOSIS — I10 Essential (primary) hypertension: Secondary | ICD-10-CM

## 2016-11-20 DIAGNOSIS — E785 Hyperlipidemia, unspecified: Secondary | ICD-10-CM | POA: Diagnosis not present

## 2016-11-20 NOTE — Patient Instructions (Signed)
Continue your meds.  Follow up in 6 months.  Take care  Dr. Chetan Mehring  

## 2016-11-20 NOTE — Assessment & Plan Note (Signed)
BP elevated today but improved on repeat. Continue lasix and Acebutolol.

## 2016-11-20 NOTE — Assessment & Plan Note (Signed)
Stable. Continue synthroid at current dosage.

## 2016-11-20 NOTE — Assessment & Plan Note (Signed)
Stable but LDL elevated. Will continue to monitor. Will consider Zetia.

## 2016-11-20 NOTE — Assessment & Plan Note (Signed)
Stable on Acebutolol.

## 2016-11-20 NOTE — Progress Notes (Signed)
   Subjective:  Patient ID: Gwendolyn King, female    DOB: Dec 25, 1940  Age: 76 y.o. MRN: [id number]  CC: Follow up  HPI:  76 year old female with HTN, HLD, Hypothyroidism presents for follow up.  HTN  Fair control given age.  Currently on Acebutolol and lasix.  Hypothyroidism  Stable on current dose of synthroid.  HLD  LDL slightly elevated.  Declines statin due to intolerance.  Palpitations  Stable on Acebutolol.    Social Hx   Social History   Social History  . Marital status: Married    Spouse name: N/A  . Number of children: N/A  . Years of education: N/A   Social History Main Topics  . Smoking status: Never Smoker  . Smokeless tobacco: Never Used  . Alcohol use No  . Drug use: No  . Sexual activity: Not Asked   Other Topics Concern  . None   Social History Narrative   Lives with husband in [place]. 3 children.      Work - retired, Pharmacist, community, Pharmacist, hospital      Diet - regular, limits sugar, eats small meals   Exercise - Jazzercise 4 days per week    Review of Systems  Constitutional: Negative.   Musculoskeletal:       Knee pain (recent TKR)    Objective:  BP (!) 150/82 (BP Location: Left Arm, Cuff Size: Normal)   Pulse 60   Temp 98.1 F (36.7 C) (Oral)   Wt 152 lb 8 oz (69.2 kg)   SpO2 100%   BMI 28.81 kg/m   BP/Weight 11/20/2016 10/19/2016 0/17/4944  Systolic BP 967 591 -  Diastolic BP 82 60 -  Wt. (Lbs) 152.5 - 158  BMI 28.81 - 29.85    Physical Exam  Constitutional: She is oriented to person, place, and time. She appears well-developed. No distress.  Cardiovascular: Normal rate and regular rhythm.   Pulmonary/Chest: Effort normal and breath sounds normal.  Neurological: She is alert and oriented to person, place, and time.  Psychiatric: She has a normal mood and affect.  Vitals reviewed.   Lab Results  Component Value Date   WBC 7.8 10/09/2016   HGB 15.3 (H) 10/09/2016   HCT 44.8 10/09/2016   PLT 127 (L) 10/09/2016   GLUCOSE 107 (H) 10/09/2016   CHOL 182 03/21/2016   TRIG 154.0 (H) 03/21/2016   HDL 44.50 03/21/2016   LDLDIRECT 140.0 12/29/2015   LDLCALC 107 (H) 03/21/2016   ALT 15 03/21/2016   AST 16 03/21/2016   NA 141 10/09/2016   K 3.4 (L) 10/09/2016   CL 100 (L) 10/09/2016   CREATININE 1.06 (H) 10/09/2016   BUN 19 10/09/2016   CO2 30 10/09/2016   TSH 1.76 12/29/2015   INR 1.06 10/09/2016   MICROALBUR 0.8 12/29/2015    Assessment & Plan:   Problem List Items Addressed This Visit    Hyperlipidemia    Stable but LDL elevated. Will continue to monitor. Will consider Zetia.      Hypertension    BP elevated today but improved on repeat. Continue lasix and Acebutolol.       Hypothyroidism    Stable. Continue synthroid at current dosage.      Palpitations    Stable on Acebutolol.          Follow-up: 6 months  [place] DO [facility]

## 2016-11-22 DIAGNOSIS — M1711 Unilateral primary osteoarthritis, right knee: Secondary | ICD-10-CM | POA: Diagnosis not present

## 2016-12-20 DIAGNOSIS — M1711 Unilateral primary osteoarthritis, right knee: Secondary | ICD-10-CM | POA: Diagnosis not present

## 2017-01-12 DIAGNOSIS — M1711 Unilateral primary osteoarthritis, right knee: Secondary | ICD-10-CM | POA: Diagnosis not present

## 2017-02-19 ENCOUNTER — Encounter: Payer: Self-pay | Admitting: Family Medicine

## 2017-02-20 NOTE — Telephone Encounter (Signed)
Spoke with patients wife and sent refill request to Dr Lacinda Axon regarding patient .

## 2017-04-09 ENCOUNTER — Encounter: Payer: Self-pay | Admitting: Family Medicine

## 2017-04-10 ENCOUNTER — Other Ambulatory Visit: Payer: Self-pay | Admitting: Family Medicine

## 2017-04-10 ENCOUNTER — Encounter: Payer: Self-pay | Admitting: Family Medicine

## 2017-04-10 NOTE — Telephone Encounter (Signed)
I [facility] in the two medications to [facility], please advise on the trulicity, thanks

## 2017-04-12 ENCOUNTER — Other Ambulatory Visit: Payer: Self-pay | Admitting: Family Medicine

## 2017-04-12 DIAGNOSIS — Z87898 Personal history of other specified conditions: Secondary | ICD-10-CM

## 2017-05-02 ENCOUNTER — Ambulatory Visit
Admission: RE | Admit: 2017-05-02 | Discharge: 2017-05-02 | Disposition: A | Discharge status: Home / Self Care | Payer: PPO | Source: Ambulatory Visit | Attending: Family Medicine | Admitting: Family Medicine

## 2017-05-02 ENCOUNTER — Other Ambulatory Visit: Payer: Self-pay | Admitting: Family Medicine

## 2017-05-02 DIAGNOSIS — N6489 Other specified disorders of breast: Secondary | ICD-10-CM | POA: Diagnosis not present

## 2017-05-02 DIAGNOSIS — R921 Mammographic calcification found on diagnostic imaging of breast: Secondary | ICD-10-CM | POA: Diagnosis not present

## 2017-05-02 DIAGNOSIS — Z87898 Personal history of other specified conditions: Secondary | ICD-10-CM

## 2017-05-02 DIAGNOSIS — N6313 Unspecified lump in the right breast, lower outer quadrant: Secondary | ICD-10-CM | POA: Diagnosis not present

## 2017-05-02 DIAGNOSIS — R928 Other abnormal and inconclusive findings on diagnostic imaging of breast: Secondary | ICD-10-CM

## 2017-05-02 DIAGNOSIS — R922 Inconclusive mammogram: Secondary | ICD-10-CM | POA: Diagnosis not present

## 2017-05-03 ENCOUNTER — Telehealth: Payer: Self-pay | Admitting: *Deleted

## 2017-05-03 NOTE — Telephone Encounter (Signed)
Pt requested mammogram results Pt contact [phone]

## 2017-05-08 ENCOUNTER — Ambulatory Visit
Admission: RE | Admit: 2017-05-08 | Discharge: 2017-05-08 | Disposition: A | Discharge status: Home / Self Care | Payer: PPO | Source: Ambulatory Visit | Attending: Family Medicine | Admitting: Family Medicine

## 2017-05-08 DIAGNOSIS — R928 Other abnormal and inconclusive findings on diagnostic imaging of breast: Secondary | ICD-10-CM

## 2017-05-08 DIAGNOSIS — N6489 Other specified disorders of breast: Secondary | ICD-10-CM

## 2017-05-08 DIAGNOSIS — D0511 Intraductal carcinoma in situ of right breast: Secondary | ICD-10-CM | POA: Diagnosis not present

## 2017-05-08 DIAGNOSIS — R921 Mammographic calcification found on diagnostic imaging of breast: Secondary | ICD-10-CM | POA: Diagnosis not present

## 2017-05-08 DIAGNOSIS — C50511 Malignant neoplasm of lower-outer quadrant of right female breast: Secondary | ICD-10-CM | POA: Diagnosis not present

## 2017-05-08 DIAGNOSIS — C50919 Malignant neoplasm of unspecified site of unspecified female breast: Secondary | ICD-10-CM

## 2017-05-08 HISTORY — PX: BREAST BIOPSY: SHX20

## 2017-05-08 HISTORY — DX: Malignant neoplasm of unspecified site of unspecified female breast: C50.919

## 2017-05-11 ENCOUNTER — Ambulatory Visit (INDEPENDENT_AMBULATORY_CARE_PROVIDER_SITE_OTHER): Payer: PPO

## 2017-05-11 DIAGNOSIS — Z23 Encounter for immunization: Secondary | ICD-10-CM

## 2017-05-14 ENCOUNTER — Encounter: Payer: Self-pay | Admitting: *Deleted

## 2017-05-14 NOTE — Progress Notes (Signed)
  Oncology Nurse Navigator Documentation  Navigator Location: [facility] (05/14/17 1400)   )Navigator Encounter Type: Telephone (05/14/17 1400) Telephone: Outgoing Call (05/14/17 1400) Abnormal Finding Date: 05/02/17 (05/14/17 1400) Confirmed Diagnosis Date: 05/10/17 (05/14/17 1400)                   Barriers/Navigation Needs: Coordination of Care;Education (05/14/17 1400)                          Time Spent with Patient: 15 (05/14/17 1400)   Tried to call patient to establish navigation services.  No answer.  Left message for her to return my call.

## 2017-05-17 ENCOUNTER — Ambulatory Visit (INDEPENDENT_AMBULATORY_CARE_PROVIDER_SITE_OTHER): Payer: PPO | Admitting: General Surgery

## 2017-05-17 ENCOUNTER — Inpatient Hospital Stay: Payer: Self-pay

## 2017-05-17 ENCOUNTER — Encounter: Payer: Self-pay | Admitting: *Deleted

## 2017-05-17 VITALS — BP 120/74 | HR 64 | Resp 14 | Ht 61.0 in | Wt 155.0 lb

## 2017-05-17 DIAGNOSIS — D0511 Intraductal carcinoma in situ of right breast: Secondary | ICD-10-CM | POA: Insufficient documentation

## 2017-05-17 DIAGNOSIS — N644 Mastodynia: Secondary | ICD-10-CM | POA: Diagnosis not present

## 2017-05-17 HISTORY — PX: BREAST BIOPSY: SHX20

## 2017-05-17 NOTE — Progress Notes (Signed)
Cardiology Office Note  Date:  05/18/2017   ID:  Gwendolyn King, DOB 14-Jul-1941, MRN [id number]  PCP:  Leone Haven, MD   Chief Complaint  Patient presents with  . OTHER    1 yr f/u no complaints today. Meds reviewed verbally with pt.    HPI:  Gwendolyn King is a very pleasant 76 year old woman with long history of palpitations,  hyperlipidemia,  Aortic atherosclerosis on CT scan 2012 leg edema treated with Lasix as needed,  who presents for follow-up of her palpitations  In follow-up today she reports that she has continued to hold her Lipitor as this was causing palpitations.  Palpitations have been well-controlled, she has continued on her beta blocker  Recent total knee replacement on the right, has recovered Goes to the gym 3-4 days per week  Lab work reviewed with her Total chol 208, previous cholesterol 140s LDL 107 No recent lab available for review  Uses lasix QOD typically for leg edema which has been stable    CT scan from 2012 reviewed with her showing mild aortic atherosclerosis  EKG personally reviewed by myself on today's visit shows normal sinus rhythm with rate 65 bpm, no significant ST or T-wave changes  Other past medical history Symptoms of palpitations described as 4 to 5 fast and skipping beats in a row. Previously was associated with some shortness of breath, now this seems to have resolved.  She describes the palpitations as someone lightly tapping the skin on her chest with her finger  Sometimes has problems with insomnia. Takes care of her elderly mother who is over 34 years old. This tends to cause some stress/anxiety for her.  Was previously on simvastatin and had myalgias   PMH:   has a past medical history of Allergy; Arthritis; Asthma; Breast cancer ([place]) (05/08/2017); Chicken pox; Colon polyp; Constipation; Hyperlipidemia; Hypothyroidism; Palpitations; and Thyroid disease.  PSH:    Past Surgical History:  Procedure Laterality Date   . ABDOMINAL HYSTERECTOMY  1981  . APPENDECTOMY  2012  . 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 COLONOSCOPY    . COLONOSCOPY W/ POLYPECTOMY    . FINGER FRACTURE SURGERY Left    5th  . ROTATOR CUFF REPAIR Right   . TONSILLECTOMY AND ADENOIDECTOMY  1963  . TOTAL KNEE ARTHROPLASTY Right 10/17/2016   Procedure: TOTAL KNEE ARTHROPLASTY;  Surgeon: Melrose Nakayama, MD;  Location: [facility];  Service: Orthopedics;  Laterality: Right;  Marland Kitchen VAGINAL DELIVERY     3    Current Outpatient Prescriptions  Medication Sig Dispense Refill  . acebutolol (SECTRAL) 200 MG capsule Take 1 capsule (200 mg total) by mouth daily. 90 capsule 3  . Acetaminophen 500 MG coapsule     . aspirin 81 MG tablet Take 81 mg by mouth daily.    . Biotin (BIOTIN 5000) 5 MG CAPS Take 1 capsule by mouth daily.    . Cholecalciferol (VITAMIN D-3) 1000 UNITS CAPS Take 1 capsule by mouth daily.    . fexofenadine (ALLEGRA) 180 MG tablet Take 180 mg by mouth daily.    . furosemide (LASIX) 20 MG tablet Take 1 tablet (20 mg total) by mouth daily. prn 90 tablet 3  . KRILL OIL PO Take by mouth daily.    Marland Kitchen LEVOXYL 75 MCG tablet TAKE 1 TABLET ONE TIME DAILY 90 tablet 3   No current facility-administered medications for this visit.  Allergies:   Lipitor [atorvastatin] (palpitations) and Simvastatin   Social History:  The patient  reports that she has never smoked. She has never used smokeless tobacco. She reports that she does not drink alcohol or use drugs.   Family History:   family history includes Arthritis in her father and mother; Cancer in her maternal aunt; Diabetes in her other and paternal aunt; Heart disease in her father and mother; Hyperlipidemia in her maternal aunt and other; Hypertension in her father and mother; Stroke in her maternal grandmother.    Review of Systems: Review of Systems  Constitutional: Negative.   Respiratory: Negative.    Cardiovascular: Positive for palpitations.  Gastrointestinal: Negative.   Musculoskeletal: Positive for joint pain.  Neurological: Negative.   Psychiatric/Behavioral: Negative.   All other systems reviewed and are negative.    PHYSICAL EXAM: VS:  BP 138/60 (BP Location: Left Arm, Patient Position: Sitting, Cuff Size: Normal)   Pulse 65   Ht 5\' 1"  (1.549 m)   Wt 155 lb 4 oz (70.4 kg)   BMI 29.33 kg/m  , BMI Body mass index is 29.33 kg/m. No change compared to previous exam GEN: Well nourished, well developed, in no acute distress  HEENT: normal  Neck: no JVD, carotid bruits, or masses Cardiac: RRR; no murmurs, rubs, or gallops,no edema  Respiratory:  clear to auscultation bilaterally, normal work of breathing GI: soft, nontender, nondistended, + BS MS: no deformity or atrophy  Skin: warm and dry, no rash Neuro:  Strength and sensation are intact Psych: euthymic mood, full affect    Recent Labs: 10/09/2016: BUN 19; Creatinine, Ser 1.06; Hemoglobin 15.3; Platelets 127; Potassium 3.4; Sodium 141    Lipid Panel Lab Results  Component Value Date   CHOL 182 03/21/2016   HDL 44.50 03/21/2016   LDLCALC 107 (H) 03/21/2016   TRIG 154.0 (H) 03/21/2016      Wt Readings from Last 3 Encounters:  05/18/17 155 lb 4 oz (70.4 kg)  05/17/17 155 lb (70.3 kg)  11/20/16 152 lb 8 oz (69.2 kg)       ASSESSMENT AND PLAN:  Essential hypertension - Plan: EKG 12-Lead Blood pressure is well controlled on today's visit. No changes made to the medications. Stable  Palpitations - Plan: EKG 12-Lead She reports symptoms resolved by holding Lipitor She is happy to stay on her beta blocker, currently with no symptoms  Hyperlipidemia  recommended continued weight loss, exercise She does not want to take a statin Suggested she consider taking Zetia. She will research this  Obesity We have encouraged continued exercise, careful diet management in an effort to lose weight.  Aortic  atherosclerosis Seen on CT scan, images reviewed with her Mild disease 6 years ago Currently not interested in restarting a statin Suggested she consider is taking Zetia   Total encounter time more than 25 minutes  Greater than 50% was spent in counseling and coordination of care with the patient    Disposition:   F/U  12 months as needed   Orders Placed This Encounter  Procedures  . EKG 12-Lead     Signed, Esmond Plants, M.D., Ph.D. 05/18/2017  [place], [place]

## 2017-05-17 NOTE — Progress Notes (Signed)
Patient ID: Gwendolyn King, female   DOB: 11-18-1940, 76 y.o.   MRN: [id number]  Chief Complaint  Patient presents with  . Other    HPI Gwendolyn King is a 76 y.o. female who presents for a breast evaluation. The most recent mammogram was done on 05/02/2017 and right breast biopsy on 05/08/2017 showing "stage 0 cancer". She states she has mild bruising with biopsy. Patient does not perform regular self breast checks but she gets regular mammograms done.   She states that she pulled a door open on it a long time ago and it has always "felt different" but no knots noted prior to mammogram.  She is her with her husband, [name]. She retired in 2006 from [employer].  HPI  Past Medical History:  Diagnosis Date  . Allergy    hay fever, fall allergies  . Arthritis   . Asthma    as a child, dad was smoker  . 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 Chicken pox   . Colon polyp   . Constipation   . Hyperlipidemia   . Hypothyroidism   . Palpitations   . Thyroid disease     Past Surgical History:  Procedure Laterality Date  . ABDOMINAL HYSTERECTOMY  1981  . APPENDECTOMY  2012  . 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 COLONOSCOPY    . COLONOSCOPY W/ POLYPECTOMY    . FINGER FRACTURE SURGERY Left    5th  . ROTATOR CUFF REPAIR Right   . TONSILLECTOMY AND ADENOIDECTOMY  1963  . TOTAL KNEE ARTHROPLASTY Right 10/17/2016   Procedure: TOTAL KNEE ARTHROPLASTY;  Surgeon: Melrose Nakayama, MD;  Location: [place];  Service: Orthopedics;  Laterality: Right;  Marland Kitchen VAGINAL DELIVERY     3    Family History  Problem Relation Age of Onset  . Arthritis Mother   . Heart disease Mother   . Hypertension Mother   . Arthritis Father   . Heart disease Father   . Hypertension Father   . Stroke Maternal Grandmother   . Cancer Maternal Aunt         abdominal?  . Hyperlipidemia Maternal Aunt   . Diabetes Paternal Aunt   . Hyperlipidemia Other   . Diabetes Other   . Breast cancer Neg Hx     Social History Social History  Substance Use Topics  . Smoking status: Never Smoker  . Smokeless tobacco: Never Used  . Alcohol use No    Allergies  Allergen Reactions  . Lipitor [Atorvastatin]     Myalgia, arthralgia  . Simvastatin     myalgia    Current Outpatient Prescriptions  Medication Sig Dispense Refill  . acebutolol (SECTRAL) 200 MG capsule Take 1 capsule (200 mg total) by mouth daily. (Patient taking differently: Take 200 mg by mouth at bedtime. ) 90 capsule 3  . acetaminophen (TYLENOL) 500 MG tablet Take 1,000 mg by mouth at bedtime.    Marland Kitchen aspirin EC 325 MG EC tablet Take 1 tablet (325 mg total) by mouth 2 (two) times daily after a meal. 30 tablet 0  . Biotin (BIOTIN 5000) 5 MG CAPS Take 1 capsule by mouth daily.    . Cholecalciferol (VITAMIN D-3) 1000 UNITS CAPS Take 1,000 Units by mouth every evening.     . docusate sodium (COLACE) 100 MG capsule Take  1 capsule (100 mg total) by mouth 2 (two) times daily. (Patient taking differently: Take 100 mg by mouth daily as needed. ) 30 capsule 0  . fexofenadine (ALLEGRA) 180 MG tablet Take 180 mg by mouth every evening.     . furosemide (LASIX) 20 MG tablet Take 1 tablet (20 mg total) by mouth daily. (Patient taking differently: Take 20 mg by mouth daily as needed for fluid or edema. ) 90 tablet 3  . KRILL OIL PO Take 1 capsule by mouth daily.     Marland Kitchen LEVOXYL 75 MCG tablet TAKE 1 TABLET ONE TIME DAILY (Patient taking differently: Take 75 mcg by mouth at bedtime. ) 90 tablet 3  . polyethylene glycol (MIRALAX / GLYCOLAX) packet Take 17 g by mouth daily.     No current facility-administered medications for this visit.     Review of Systems Review of Systems  Constitutional: Negative.   Respiratory: Negative.   Cardiovascular: Negative.     Blood pressure 120/74, pulse 64, resp.  rate 14, height 5\' 1"  (1.549 m), weight 155 lb (70.3 kg).  Physical Exam Physical Exam  Constitutional: She is oriented to person, place, and time. She appears well-developed and well-nourished.  HENT:  Mouth/Throat: Oropharynx is clear and moist.  Eyes: Conjunctivae are normal. No scleral icterus.  Neck: Neck supple.  Cardiovascular: Normal rate, regular rhythm and normal heart sounds.   Pulmonary/Chest: Effort normal and breath sounds normal. Right breast exhibits skin change. Right breast exhibits no inverted nipple, no mass, no nipple discharge and no tenderness. Left breast exhibits no inverted nipple, no mass, no nipple discharge, no skin change and no tenderness.    Bruise at biopsy site right breast.  Lymphadenopathy:    She has no cervical adenopathy.    She has no axillary adenopathy.       Right: No supraclavicular adenopathy present.  Neurological: She is alert and oriented to person, place, and time.  Skin: Skin is warm and dry.  Psychiatric: Her behavior is normal.    Data Reviewed Bone density dated 05/17/2016 showed evidence of osteopenia.  Bilateral screening mammograms dated 02/15/2016 suggested a possible mass in the right breast for which additional imaging was requested. BI-RADS-0.  Right breast diagnostic mammogram and ultrasound dated 05/14/2016 suggested asymmetric fibroglandular tissue with nonspecific ultrasound findings. BI-RADS-3.  Bilateral diagnostic mammograms dated 05/02/2017 showed progression in the area of asymmetry in the lower outer quadrant of the left breast with faint calcifications measuring up to 5 cm in diameter. Targeted ultrasound suggested a vague hypoechoic area at the 7:00 position, 5 cm from the nipple measuring 1 x 2 x 3.4 cm. BI-RADS-4.  Right breast biopsy completed 05/10/2017 with clip placement described the coil marker within the biopsy site in the medial area of the 5 cm asymmetry originally identified.  Ultrasound examination  completed today to determine if preoperative needle localization would be required was completed. In the retroareolar area multiple dilated ducts are noted with a hypoechoic area with some posterior acoustic enhancement in the immediate retroareolar area measuring 0.54 x 0.63 x 0.68 cm. Multiple other moderately dilated ducts are also noted. The index lesion noted above has multiple internal echoes without a clearly defined mass. This is most likely debris but biopsy will be indicated if the patient chooses breast conservation. By red-4.  Examination of the lower outer quadrant of the right breast in the 7:00 position showed a hypoechoic area measuring 0.7 x 0.9 x 1.2 cm. As with the prebiopsy ultrasound  this is a very indistinct area. BI-RADS-6.  Tomo synthesis biopsy dated 05/08/2017: A. 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   DCIS is in multiple core fragments measuring up to 10 mm, and exhibits  intermediate to high nuclear grade, with micropapillary pattern. There  is one periductal area with features of invasion (block A4), which  measures 0.5 mm. If the excision specimen is negative for invasive  carcinoma, this would qualify as microinvasive carcinoma. Testing for ER  and PR is deferred to the excised specimen.   Assessment    High-grade DCIS with suspicion for microinvasion.  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    Plan    The majority of the visit of the hour-long visit was spent reviewing the options for breast cancer treatment. Breast conservation with lumpectomy and radiation therapy  was presented as equivalent to mastectomy for long-term control. The pros and cons of each treatment regimen were reviewed. The indications for additional therapy such as hormone suppression were touched on briefly,  realizing that receptor status is being deferred pending formal excision. Opportunity for second surgical opinion reviewed.  Indications for biopsy of the retroareolar ductal prominence should breast conservation be chosen discussed.  Considering the vague area on ultrasound representing an  up to 5 cm area on mammography, the patient would require preoperative needle localization.  With the suggestion of microinvasion, and the modest volume removed at the time of her recent vacuum biopsy (1.35 cm versus an estimated area on mammography of 75 cm) there would be a suspicion for a more pronounced microinvasion or possibly even frank invasion. I would estimate the potential for upstaging about 35-40% and have recommended a sentinel node biopsy be completed at the time of wide excision, and certainly if mastectomy is chosen.  Recommendations for post wide excision radiation therapy were reviewed in details. The patient would likely be best managed by whole breast radiation, but this would be determined after surgical resection.  An informational brochure was provided.  The patient will discuss with her husband and family her desires in contact the offices with additional questions if she decides on a treatment modality.   HPI, Physical Exam, Assessment and Plan have been scribed under the direction and in the presence of Robert Bellow, MD. Karie Fetch, RN  I have completed the exam and reviewed the above documentation for accuracy and completeness.  I agree with the above.  Haematologist has been used and any errors in dictation or transcription are unintentional.  Hervey Ard, M.D., F.A.C.S. I have completed the exam and reviewed the above documentation for accuracy and completeness.  I agree with the above.  Haematologist has been used and any errors in dictation or transcription are unintentional.  Hervey Ard, M.D., F.A.C.S.  Robert Bellow 05/17/2017, 7:53 PM

## 2017-05-17 NOTE — Patient Instructions (Signed)
The patient is aware to call back for any questions or concerns.  

## 2017-05-18 ENCOUNTER — Ambulatory Visit (INDEPENDENT_AMBULATORY_CARE_PROVIDER_SITE_OTHER): Payer: PPO | Admitting: Cardiovascular Disease

## 2017-05-18 ENCOUNTER — Encounter: Payer: Self-pay | Admitting: Cardiovascular Disease

## 2017-05-18 VITALS — BP 138/60 | HR 65 | Ht 61.0 in | Wt 155.2 lb

## 2017-05-18 DIAGNOSIS — I10 Essential (primary) hypertension: Secondary | ICD-10-CM | POA: Diagnosis not present

## 2017-05-18 DIAGNOSIS — E782 Mixed hyperlipidemia: Secondary | ICD-10-CM | POA: Diagnosis not present

## 2017-05-18 DIAGNOSIS — R002 Palpitations: Secondary | ICD-10-CM

## 2017-05-18 DIAGNOSIS — I700 Atherosclerosis of aorta: Secondary | ICD-10-CM

## 2017-05-18 NOTE — Patient Instructions (Signed)
Medication Instructions:   No medication changes made  Research Zetia one a day for cholesterol  Labwork:  No new labs needed  Testing/Procedures:  No further testing at this time   Follow-Up: It was a pleasure seeing you in the office today. Please call us if you have new issues that need to be addressed before your next appt.  [phone]  Your physician wants you to follow-up in: 12 months as needed.  You will receive a reminder letter in the mail two months in advance. If you don't receive a letter, please call our office to schedule the follow-up appointment.  If you need a refill on your cardiac medications before your next appointment, please call your pharmacy.

## 2017-05-21 ENCOUNTER — Encounter: Payer: Self-pay | Admitting: General Surgery

## 2017-05-22 ENCOUNTER — Telehealth: Payer: Self-pay

## 2017-05-22 ENCOUNTER — Other Ambulatory Visit: Payer: Self-pay | Admitting: General Surgery

## 2017-05-22 ENCOUNTER — Other Ambulatory Visit: Payer: Self-pay

## 2017-05-22 DIAGNOSIS — D0511 Intraductal carcinoma in situ of right breast: Secondary | ICD-10-CM

## 2017-05-22 DIAGNOSIS — N6314 Unspecified lump in the right breast, lower inner quadrant: Secondary | ICD-10-CM | POA: Diagnosis not present

## 2017-05-22 DIAGNOSIS — R928 Other abnormal and inconclusive findings on diagnostic imaging of breast: Secondary | ICD-10-CM | POA: Diagnosis not present

## 2017-05-22 NOTE — Telephone Encounter (Signed)
-----   Message from Robert Bellow, MD sent at 05/22/2017  7:03 AM EDT ----- Patient needs a right breast Finesse biopsy before surgery. Can work on scheduling right breast wide excision, but preop visit for biopsy will need to be at least 5 days prior to surgery.

## 2017-05-22 NOTE — Telephone Encounter (Signed)
Spoke with patient about scheduling her surgery and pre op in office with Dr Bary Castilla. The patient is scheduled for surgery at [facility] on 06/07/17. She will pre admit by phone. She will have a pre op visit with Dr Bary Castilla as well as a Finesse Biopsy of the right breast in office on 05/31/17 at 2:15 pm. The patient is aware of dates, time, and instructions.

## 2017-05-23 ENCOUNTER — Other Ambulatory Visit: Payer: Self-pay | Admitting: General Surgery

## 2017-05-23 ENCOUNTER — Other Ambulatory Visit: Payer: Self-pay

## 2017-05-23 DIAGNOSIS — R928 Other abnormal and inconclusive findings on diagnostic imaging of breast: Secondary | ICD-10-CM | POA: Diagnosis not present

## 2017-05-23 DIAGNOSIS — D0511 Intraductal carcinoma in situ of right breast: Secondary | ICD-10-CM

## 2017-05-23 DIAGNOSIS — N6314 Unspecified lump in the right breast, lower inner quadrant: Secondary | ICD-10-CM | POA: Diagnosis not present

## 2017-05-24 ENCOUNTER — Encounter: Payer: Self-pay | Admitting: *Deleted

## 2017-05-24 ENCOUNTER — Other Ambulatory Visit: Payer: Self-pay | Admitting: *Deleted

## 2017-05-24 NOTE — Progress Notes (Signed)
  Oncology Nurse Navigator Documentation  Navigator Location: [facility] (05/24/17 1100)   )Navigator Encounter Type: Telephone (05/24/17 1100) Telephone: [place] Call (05/24/17 1100)     Surgery Date: 06/07/17 (05/24/17 1100)                 Barriers/Navigation Needs: Education (05/24/17 1100) Education: Coping with Diagnosis/ Prognosis;Newly Diagnosed Cancer Education;Understanding Cancer/ Treatment Options (05/24/17 1100) Interventions: Education (05/24/17 1100)     Education Method: Written (05/24/17 1100)                Time Spent with Patient: 15 (05/24/17 1100)   Called patient to see if she had received her patient educational literature, "My Breast Cancer Treatment Handbook" by Josephine Igo, RN.  States she got yesterday via [employer].  No questions at this time.  She is to call if she has any questions or needs.

## 2017-05-28 ENCOUNTER — Telehealth: Payer: Self-pay | Admitting: *Deleted

## 2017-05-28 ENCOUNTER — Other Ambulatory Visit: Payer: Self-pay | Admitting: Family Medicine

## 2017-05-28 DIAGNOSIS — E039 Hypothyroidism, unspecified: Secondary | ICD-10-CM

## 2017-05-28 MED ORDER — LEVOXYL 75 MCG PO TABS: ORAL_TABLET | ORAL | 3 refills | Status: DC

## 2017-05-28 MED ORDER — LEVOXYL 75 MCG PO TABS: 75.0000 ug | ORAL_TABLET | Freq: Every day | ORAL | 0 refills | Status: DC

## 2017-05-28 NOTE — Telephone Encounter (Signed)
Sent to pharmacy. Patient needs follow-up for TSH.

## 2017-05-28 NOTE — Telephone Encounter (Signed)
Last OV with Dr.Cook 11/20/16 last filled 12/29/15 by Dr.Walker 90 3rf

## 2017-05-28 NOTE — Telephone Encounter (Signed)
Patient has been notified that the pre-admission testing department Caryl Pina from pre-admit) called our office this morning to state that they need patient to come in for an office appointment instead of a phone interview.   The patient has been notified that an appointment has been scheduled for 06-01-17 at 8 am. She verbalizes understanding.

## 2017-05-28 NOTE — Telephone Encounter (Signed)
Pt called requesting a refill on LEVOXYL 75 MCG tablet.  Pt states that she is completely out and that she needs 2 weeks sent to a local pharmacy and then 90 to the mail order. Please advise, thank you!  [facility] [address], [place] - [place], [place]

## 2017-05-29 ENCOUNTER — Inpatient Hospital Stay: Admission: RE | Admit: 2017-05-29 | Payer: PPO | Source: Ambulatory Visit

## 2017-05-29 NOTE — Telephone Encounter (Signed)
Left message to return call 

## 2017-05-29 NOTE — Telephone Encounter (Signed)
Pt called back and stated that she is having surgery next week to have a lumnp removed from her breast. Pt states that she is going to Dr. Luz Brazen office tomorrow. Can she have the labs done there? Please advise, thank you!

## 2017-05-30 NOTE — Telephone Encounter (Signed)
Left message to return call 

## 2017-05-30 NOTE — Telephone Encounter (Signed)
Please advise 

## 2017-05-30 NOTE — Telephone Encounter (Signed)
I'm happy for her to have labs wherever she can get them though I am unsure if they will be able to draw labs on her and Dr. Dwyane Luo office. We could always have her come here for a lab appointment tomorrow. We could also check with Dr. Dwyane Luo office to see if they have lab drawing capabilities.

## 2017-05-30 NOTE — Telephone Encounter (Signed)
Patient states she has a pre-op appmt at the hospital Friday and would like it done at that time, please place order

## 2017-05-31 ENCOUNTER — Inpatient Hospital Stay: Payer: Self-pay

## 2017-05-31 ENCOUNTER — Ambulatory Visit (INDEPENDENT_AMBULATORY_CARE_PROVIDER_SITE_OTHER): Payer: PPO | Admitting: General Surgery

## 2017-05-31 ENCOUNTER — Encounter: Payer: Self-pay | Admitting: General Surgery

## 2017-05-31 VITALS — BP 126/62 | HR 68 | Resp 12 | Ht 61.0 in | Wt 152.0 lb

## 2017-05-31 DIAGNOSIS — N6313 Unspecified lump in the right breast, lower outer quadrant: Secondary | ICD-10-CM | POA: Diagnosis not present

## 2017-05-31 DIAGNOSIS — D0511 Intraductal carcinoma in situ of right breast: Secondary | ICD-10-CM

## 2017-05-31 DIAGNOSIS — N649 Disorder of breast, unspecified: Secondary | ICD-10-CM | POA: Diagnosis not present

## 2017-05-31 NOTE — Patient Instructions (Signed)

## 2017-05-31 NOTE — Telephone Encounter (Signed)
Ordered

## 2017-05-31 NOTE — Progress Notes (Signed)
Patient ID: Gwendolyn King, female   DOB: 1940-10-30, 76 y.o.   MRN: [id number]  Chief Complaint  Patient presents with  . Procedure    HPI Gwendolyn King is a 76 y.o. female.  Here today for right breast biopsy. The patient is scheduled for wide excision of a lower outer quadrant area of DCIS with microinvasion next week. Recent ultrasound exam showed an abnormality in the right retroareolar area.   She is here with her husband, Gwendolyn King.  HPI  Past Medical History:  Diagnosis Date  . Allergy    hay fever, fall allergies  . Arthritis   . Asthma    as a child, dad was smoker  . 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 Chicken pox   . Colon polyp   . Constipation   . Hyperlipidemia   . Hypothyroidism   . Palpitations   . Thyroid disease     Past Surgical History:  Procedure Laterality Date  . ABDOMINAL HYSTERECTOMY  1981  . APPENDECTOMY  2012  . 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 COLONOSCOPY    . COLONOSCOPY W/ POLYPECTOMY    . FINGER FRACTURE SURGERY Left    5th  . ROTATOR CUFF REPAIR Right   . TONSILLECTOMY AND ADENOIDECTOMY  1963  . TOTAL KNEE ARTHROPLASTY Right 10/17/2016   Procedure: TOTAL KNEE ARTHROPLASTY;  Surgeon: Melrose Nakayama, MD;  Location: [place];  Service: Orthopedics;  Laterality: Right;  Marland Kitchen VAGINAL DELIVERY     3    Family History  Problem Relation Age of Onset  . Arthritis Mother   . Heart disease Mother   . Hypertension Mother   . Arthritis Father   . Heart disease Father   . Hypertension Father   . Stroke Maternal Grandmother   . Cancer Maternal Aunt        abdominal?  . Hyperlipidemia Maternal Aunt   . Diabetes Paternal Aunt   . Hyperlipidemia Other   . Diabetes Other   . Breast cancer Neg Hx     Social History Social History  Substance Use Topics  . Smoking status: Never Smoker  .  Smokeless tobacco: Never Used  . Alcohol use No    Allergies  Allergen Reactions  . Lipitor [Atorvastatin] Other (See Comments)    Myalgia, arthralgia  . Simvastatin Other (See Comments)    myalgia    Current Outpatient Prescriptions  Medication Sig Dispense Refill  . acebutolol (SECTRAL) 200 MG capsule Take 1 capsule (200 mg total) by mouth daily. (Patient taking differently: Take 200 mg by mouth at bedtime. ) 90 capsule 3  . acetaminophen (TYLENOL) 500 MG tablet Take 1,000 mg by mouth at bedtime.    Marland Kitchen aspirin EC 81 MG tablet Take 81 mg by mouth daily.    . Biotin (BIOTIN 5000) 5 MG CAPS Take 5 mg by mouth daily.     . Cholecalciferol (VITAMIN D-3) 1000 UNITS CAPS Take 1,000 Units by mouth every evening.     . fexofenadine (ALLEGRA) 180 MG tablet Take 180 mg by mouth every evening.     . furosemide (LASIX) 20 MG tablet Take 1 tablet (20 mg total) by mouth daily. (Patient taking differently: Take 20 mg by mouth daily as needed for fluid or edema. ) 90 tablet 3  . KRILL OIL PO Take 1  capsule by mouth daily.     Marland Kitchen LEVOXYL 75 MCG tablet TAKE 1 TABLET ONE TIME DAILY 90 tablet 3  . LEVOXYL 75 MCG tablet Take 1 tablet (75 mcg total) by mouth daily before breakfast. 14 tablet 0  . polyethylene glycol (MIRALAX / GLYCOLAX) packet Take 17 g by mouth daily.     No current facility-administered medications for this visit.     Review of Systems Review of Systems  Constitutional: Negative.   Respiratory: Negative.   Cardiovascular: Negative.     Blood pressure 126/62, pulse 68, resp. rate 12, height 5\' 1"  (1.549 m), weight 152 lb (68.9 kg), SpO2 97 %.  Physical Exam Physical Exam  Constitutional: She is oriented to person, place, and time. She appears well-developed and well-nourished.  Neurological: She is alert and oriented to person, place, and time.  Skin: Skin is warm and dry.  Psychiatric: Her behavior is normal.    Data Reviewed Ultrasound examination of the right retroareolar  area in the 7:00 position 1.5 cm from the nipple Note a hypoechoic mass with a posterior acoustic shadowing measuring 0.53 x 0.53 maximal diameter radially and 1.09 cm in the anti-radial direction. BI-RADS-4.  We reviewed the indications for core biopsy prior to planned wide excision of an area of DCIS with microinvasion in the lower outer quadrant of the right breast. She was amenable to proceed. N cc of 0.5% Xylocaine with 0.25% Marcaine with 1-200,000 of epinephrine was utilized well tolerated. ChloraPrep was applied to the skin. Minimal discomfort when skin incision. Making use of a radial approach from the inferior aspect of the breast towards the nipple a 14-gauge Bard vacuum biopsy device was positioned along the long axis of the lesion. 6 core samples were obtained with moderate volume decrease. A ribbon clip was placed without incident. Benzoin Steri-Strips applied followed by Telfa and Tegaderm dressing. Wound care instructions provided.  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      Plan    The patient will be contacted when pathology is available. Depending on the pathology this may change the extensive surgical resection. The potential for making breast conservation difficult was briefly discussed.     HPI, Physical Exam, Assessment and Plan have been scribed under the direction and in the presence of Robert Bellow, MD. Karie Fetch, RN  I have completed the exam and reviewed the above documentation for accuracy and completeness.  I agree with the above.  Haematologist has been used and any errors in dictation or transcription are unintentional.  Hervey Ard, M.D., F.A.C.S.   Robert Bellow 05/31/2017, 7:50 PM

## 2017-06-01 ENCOUNTER — Telehealth: Payer: Self-pay | Admitting: General Surgery

## 2017-06-01 ENCOUNTER — Encounter
Admission: RE | Admit: 2017-06-01 | Discharge: 2017-06-01 | Disposition: A | Discharge status: Home / Self Care | Payer: PPO | Source: Ambulatory Visit | Attending: General Surgery | Admitting: General Surgery

## 2017-06-01 ENCOUNTER — Other Ambulatory Visit: Payer: Self-pay

## 2017-06-01 DIAGNOSIS — Z833 Family history of diabetes mellitus: Secondary | ICD-10-CM | POA: Diagnosis not present

## 2017-06-01 DIAGNOSIS — Z853 Personal history of malignant neoplasm of breast: Secondary | ICD-10-CM | POA: Insufficient documentation

## 2017-06-01 DIAGNOSIS — Z8249 Family history of ischemic heart disease and other diseases of the circulatory system: Secondary | ICD-10-CM | POA: Diagnosis not present

## 2017-06-01 DIAGNOSIS — Z888 Allergy status to other drugs, medicaments and biological substances status: Secondary | ICD-10-CM | POA: Diagnosis not present

## 2017-06-01 DIAGNOSIS — Z7982 Long term (current) use of aspirin: Secondary | ICD-10-CM | POA: Insufficient documentation

## 2017-06-01 DIAGNOSIS — Z9889 Other specified postprocedural states: Secondary | ICD-10-CM | POA: Insufficient documentation

## 2017-06-01 DIAGNOSIS — D241 Benign neoplasm of right breast: Secondary | ICD-10-CM | POA: Diagnosis not present

## 2017-06-01 DIAGNOSIS — Z8261 Family history of arthritis: Secondary | ICD-10-CM | POA: Diagnosis not present

## 2017-06-01 DIAGNOSIS — Z90710 Acquired absence of both cervix and uterus: Secondary | ICD-10-CM | POA: Insufficient documentation

## 2017-06-01 DIAGNOSIS — Z01812 Encounter for preprocedural laboratory examination: Secondary | ICD-10-CM | POA: Insufficient documentation

## 2017-06-01 DIAGNOSIS — Z79899 Other long term (current) drug therapy: Secondary | ICD-10-CM | POA: Diagnosis not present

## 2017-06-01 DIAGNOSIS — Z823 Family history of stroke: Secondary | ICD-10-CM | POA: Insufficient documentation

## 2017-06-01 LAB — COMPREHENSIVE METABOLIC PANEL WITH GFR
ALT: 16 U/L (ref 14–54)
AST: 20 U/L (ref 15–41)
Albumin: 4.4 g/dL (ref 3.5–5.0)
Alkaline Phosphatase: 52 U/L (ref 38–126)
Anion gap: 9 (ref 5–15)
BUN: 26 mg/dL — ABNORMAL HIGH (ref 6–20)
CO2: 27 mmol/L (ref 22–32)
Calcium: 10 mg/dL (ref 8.9–10.3)
Chloride: 105 mmol/L (ref 101–111)
Creatinine, Ser: 1.23 mg/dL — ABNORMAL HIGH (ref 0.44–1.00)
GFR calc Af Amer: 49 mL/min — ABNORMAL LOW
GFR calc non Af Amer: 42 mL/min — ABNORMAL LOW
Glucose, Bld: 85 mg/dL (ref 65–99)
Potassium: 3.9 mmol/L (ref 3.5–5.1)
Sodium: 141 mmol/L (ref 135–145)
Total Bilirubin: 1.2 mg/dL (ref 0.3–1.2)
Total Protein: 6.9 g/dL (ref 6.5–8.1)

## 2017-06-01 LAB — DIFFERENTIAL
Basophils Absolute: 0 10*3/uL (ref 0–0.1)
Basophils Relative: 1 %
Eosinophils Absolute: 0.2 10*3/uL (ref 0–0.7)
Eosinophils Relative: 4 %
Lymphocytes Relative: 35 %
Lymphs Abs: 1.8 10*3/uL (ref 1.0–3.6)
Monocytes Absolute: 0.6 10*3/uL (ref 0.2–0.9)
Monocytes Relative: 11 %
Neutro Abs: 2.6 10*3/uL (ref 1.4–6.5)
Neutrophils Relative %: 51 %

## 2017-06-01 LAB — CBC
HCT: 43.8 % (ref 35.0–47.0)
Hemoglobin: 14.9 g/dL (ref 12.0–16.0)
MCH: 29.7 pg (ref 26.0–34.0)
MCHC: 34.1 g/dL (ref 32.0–36.0)
MCV: 87 fL (ref 80.0–100.0)
Platelets: 127 10*3/uL — ABNORMAL LOW (ref 150–440)
RBC: 5.04 MIL/uL (ref 3.80–5.20)
RDW: 12.3 % (ref 11.5–14.5)
WBC: 5.2 10*3/uL (ref 3.6–11.0)

## 2017-06-01 MED ORDER — ACEBUTOLOL HCL 200 MG PO CAPS: 200.0000 mg | ORAL_CAPSULE | Freq: Every day | ORAL | 3 refills | Status: DC

## 2017-06-01 NOTE — Telephone Encounter (Signed)
Sent to pharmacy. Needs to establish care. 

## 2017-06-01 NOTE — Telephone Encounter (Signed)
Last OV 11/20/16 by Dr.Cook filed by Dr.Walker 12/29/15 90 3rf

## 2017-06-01 NOTE — Telephone Encounter (Signed)
Patient called to check on results from bx 05-31-17.patient was told dr byrnett would call her sometime today(per Carly-lyn)Called patient. Patient ask that you call her cell# [phone] or husband's(Stephen's/[phone]).

## 2017-06-01 NOTE — Telephone Encounter (Signed)
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 No other details available at present.  The patient was advised that the second biopsy did indeed show a cancer, and this can certainly change the scope of our planned surgical resection for breast conservation.  She reports that she is doing well.  We will get together at 445 on the afternoon of Monday, October 15 to review our options.

## 2017-06-01 NOTE — Patient Instructions (Signed)
Your procedure is scheduled on: Thurs 06/07/17 Report to Mammography at 7:45 .  Remember: Instructions that are not followed completely may result in serious medical risk, up to and including death, or upon the discretion of your surgeon and anesthesiologist your surgery may need to be rescheduled.     _X__ 1. Do not eat food after midnight the night before your procedure.                 No gum chewing or hard candies. You may drink clear liquids up to 2 hours                 before you are scheduled to arrive for your surgery- DO not drink clear                 liquids within 2 hours of the start of your surgery.                 Clear Liquids include:  water, apple juice without pulp, clear carbohydrate                 drink such as Clearfast of Gartorade, Black Coffee or Tea (Do not add                 anything to coffee or tea).     _X__ 2.  No Alcohol for 24 hours before or after surgery.   ___ 3.  Do Not Smoke or use e-cigarettes For 24 Hours Prior to Your Surgery.                 Do not use any chewable tobacco products for at least 6 hours prior to                 surgery.  ____  4.  Bring all medications with you on the day of surgery if instructed.   __x__  5.  Notify your doctor if there is any change in your medical condition      (cold, fever, infections).     Do not wear jewelry, make-up, hairpins, clips or nail polish. Do not wear lotions, powders, or perfumes. You may wear deodorant. Do not shave 48 hours prior to surgery. Men may shave face and neck. Do not bring valuables to the hospital.    [facility] is not responsible for any belongings or valuables.  Contacts, dentures or bridgework may not be worn into surgery. Leave your suitcase in the car. After surgery it may be brought to your room. For patients admitted to the hospital, discharge time is determined by your treatment team.   Patients discharged the day of surgery will not be  allowed to drive home.   Please read over the following fact sheets that you were given:    __x__ Take these medicines the morning of surgery with A SIP OF WATER:    1. LEVOXYL 75 MCG tablet  2.   3.   4.  5.  6.  ____ Fleet Enema (as directed)   __x__ Use CHG Soap as directed  ____ Use inhalers on the day of surgery  ____ Stop metformin 2 days prior to surgery    ____ Take 1/2 of usual insulin dose the night before surgery. No insulin the morning          of surgery.   __x__ Stop aspirin on today  ____ Stop Anti-inflammatories on    __x__ Stop supplements until after surgery.  Biotin (  BIOTIN 5000) 5 MG CAPS,KRILL OIL PO  ____ Bring C-Pap to the hospital.

## 2017-06-04 ENCOUNTER — Encounter: Payer: Self-pay | Admitting: General Surgery

## 2017-06-04 ENCOUNTER — Ambulatory Visit (INDEPENDENT_AMBULATORY_CARE_PROVIDER_SITE_OTHER): Payer: PPO | Admitting: General Surgery

## 2017-06-04 VITALS — BP 128/74 | HR 78 | Resp 14 | Ht 61.0 in | Wt 152.0 lb

## 2017-06-04 DIAGNOSIS — D0581 Other specified type of carcinoma in situ of right breast: Secondary | ICD-10-CM

## 2017-06-04 DIAGNOSIS — D0511 Intraductal carcinoma in situ of right breast: Secondary | ICD-10-CM

## 2017-06-04 MED ORDER — LIDOCAINE-PRILOCAINE 2.5-2.5 % EX CREA: 1.0000 "application " | TOPICAL_CREAM | CUTANEOUS | 0 refills | Status: DC | PRN

## 2017-06-04 NOTE — Telephone Encounter (Signed)
Patients husband notified and states he will have her call back to schedule appmt

## 2017-06-04 NOTE — Progress Notes (Signed)
Patient ID: Gwendolyn King, female   DOB: May 13, 1941, 76 y.o.   MRN: [id number]  Chief Complaint  Patient presents with  . Follow-up    HPI Gwendolyn King is a 76 y.o. female here to discuss recent breast biopsy results. She reports that she is doing well. She is here today with her husband, Gwendolyn King.  HPI  Past Medical History:  Diagnosis Date  . Allergy    hay fever, fall allergies  . Arthritis   . Asthma    as a child, dad was smoker  . 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 Chicken pox   . Colon polyp   . Constipation   . Hyperlipidemia   . Hypothyroidism   . Palpitations   . Thyroid disease     Past Surgical History:  Procedure Laterality Date  . ABDOMINAL HYSTERECTOMY  1981  . APPENDECTOMY  2012  . 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 COLONOSCOPY    . COLONOSCOPY W/ POLYPECTOMY    . FINGER FRACTURE SURGERY Left    5th  . JOINT REPLACEMENT     right knee  . ROTATOR CUFF REPAIR Right   . TONSILLECTOMY AND ADENOIDECTOMY  1963  . TOTAL KNEE ARTHROPLASTY Right 10/17/2016   Procedure: TOTAL KNEE ARTHROPLASTY;  Surgeon: Melrose Nakayama, MD;  Location: [place];  Service: Orthopedics;  Laterality: Right;  Marland Kitchen VAGINAL DELIVERY     3    Family History  Problem Relation Age of Onset  . Arthritis Mother   . Heart disease Mother   . Hypertension Mother   . Arthritis Father   . Heart disease Father   . Hypertension Father   . Stroke Maternal Grandmother   . Cancer Maternal Aunt        abdominal?  . Hyperlipidemia Maternal Aunt   . Diabetes Paternal Aunt   . Hyperlipidemia Other   . Diabetes Other   . Breast cancer Neg Hx     Social History Social History  Substance Use Topics  . Smoking status: Never Smoker  . Smokeless tobacco: Never Used  . Alcohol use No    Allergies  Allergen Reactions  . Lipitor  [Atorvastatin] Other (See Comments)    Myalgia, arthralgia  . Simvastatin Other (See Comments)    myalgia    Current Outpatient Prescriptions  Medication Sig Dispense Refill  . acebutolol (SECTRAL) 200 MG capsule Take 1 capsule (200 mg total) by mouth daily. 90 capsule 3  . acetaminophen (TYLENOL) 500 MG tablet Take 1,000 mg by mouth at bedtime.    Marland Kitchen aspirin EC 81 MG tablet Take 81 mg by mouth daily.    . Biotin (BIOTIN 5000) 5 MG CAPS Take 5 mg by mouth daily.     . Cholecalciferol (VITAMIN D-3) 1000 UNITS CAPS Take 1,000 Units by mouth every evening.     . fexofenadine (ALLEGRA) 180 MG tablet Take 180 mg by mouth every evening.     . furosemide (LASIX) 20 MG tablet Take 1 tablet (20 mg total) by mouth daily. (Patient taking differently: Take 20 mg by mouth daily as needed for fluid or edema. ) 90 tablet 3  . KRILL OIL PO Take 1 capsule by mouth daily.     Marland Kitchen LEVOXYL 75 MCG tablet TAKE 1 TABLET ONE TIME DAILY 90 tablet 3  . polyethylene glycol (  MIRALAX / GLYCOLAX) packet Take 17 g by mouth daily.    Marland Kitchen lidocaine-prilocaine (EMLA) cream Apply 1 application topically as needed. 5 g 0   No current facility-administered medications for this visit.     Review of Systems Review of Systems  Constitutional: Negative.   Respiratory: Negative.   Cardiovascular: Negative.     Blood pressure 128/74, pulse 78, resp. rate 14, height 5\' 1"  (1.549 m), weight 152 lb (68.9 kg).  Physical Exam Physical Exam No significant bruising pace by her recent biopsy site reported by the nurse.  Data Reviewed 05/31/2017 core biopsy: Diagnosis Breast, right, needle core biopsy, mass, LOQ - PAPILLARY LESION CONSISTENT WITH PAPILLARY CARCINOMA. Microscopic Comment There are multiple portions of a papillary lesion with atypia consistent with papillary carcinoma. A definitive invasive component is not identified in these biopsies.  Assessment    DCIS with microinvasion.  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 Options for management were reviewed including wide excision of each area followed by whole breast radiation, mastectomy with or without immediate reconstruction.  The potential for positive margins especially in the area of microcalcifications previously biopsied was reviewed.  The patient is very interested in breast conservation and less so in mastectomy with reconstruction. At this time, she desires to proceed with planned wide excision of both lesions. I think we can likely spare the nipple areolar complex. The possibility of modest breast volume decrease with both surgical resection and following whole breast radiation was reviewed.  A prescription for EMLA cream to be applied to the nipple areolar complex prior to presentation forsentinel node biopsy was sent to her pharmacy. She'll apply this prior to presentation of the hospital and put either a piece of Saran wrap or plastic bag over the area to encourage absorption prior to sentinel node injection.  Plans for bracketing wires were reviewed.  We spent about 40 minutes reviewing all the options for management of her 2 foci of likely in situ or perhaps microinvasive disease. The importance of sentinel node biopsy and in light of the fact that we'll be working underneath the areola and this may obscure or disrupt the lymphatic channels making sentinel node biopsy and possible in the future should invasive disease be identified in the area of diffuse microcalcifications.    HPI, Physical Exam, Assessment and Plan have been scribed under the direction and in the presence of Robert Bellow, MD  Concepcion Living, LPN  I have completed the exam and reviewed the above documentation for accuracy and completeness.  I agree with the above.  Haematologist has been used and any errors in dictation or  transcription are unintentional.  Hervey Ard, M.D., F.A.C.S. Robert Bellow 06/04/2017, 10:00 PM

## 2017-06-07 ENCOUNTER — Ambulatory Visit
Admission: RE | Admit: 2017-06-07 | Discharge: 2017-06-07 | Disposition: A | Discharge status: Home / Self Care | Payer: PPO | Source: Ambulatory Visit | Attending: General Surgery | Admitting: General Surgery

## 2017-06-07 ENCOUNTER — Ambulatory Visit: Payer: PPO | Admitting: Anesthesiology

## 2017-06-07 ENCOUNTER — Encounter
Admission: RE | Disposition: A | Discharge status: Home / Self Care | Payer: Self-pay | Source: Ambulatory Visit | Attending: General Surgery

## 2017-06-07 ENCOUNTER — Encounter: Payer: Self-pay | Admitting: Emergency Medicine

## 2017-06-07 DIAGNOSIS — D0511 Intraductal carcinoma in situ of right breast: Secondary | ICD-10-CM | POA: Diagnosis present

## 2017-06-07 DIAGNOSIS — Z7982 Long term (current) use of aspirin: Secondary | ICD-10-CM | POA: Diagnosis not present

## 2017-06-07 DIAGNOSIS — C50511 Malignant neoplasm of lower-outer quadrant of right female breast: Secondary | ICD-10-CM | POA: Diagnosis not present

## 2017-06-07 DIAGNOSIS — Z79899 Other long term (current) drug therapy: Secondary | ICD-10-CM | POA: Insufficient documentation

## 2017-06-07 DIAGNOSIS — I10 Essential (primary) hypertension: Secondary | ICD-10-CM | POA: Diagnosis not present

## 2017-06-07 DIAGNOSIS — Z791 Long term (current) use of non-steroidal anti-inflammatories (NSAID): Secondary | ICD-10-CM | POA: Insufficient documentation

## 2017-06-07 DIAGNOSIS — N6314 Unspecified lump in the right breast, lower inner quadrant: Secondary | ICD-10-CM | POA: Diagnosis not present

## 2017-06-07 DIAGNOSIS — E669 Obesity, unspecified: Secondary | ICD-10-CM | POA: Insufficient documentation

## 2017-06-07 DIAGNOSIS — D0512 Intraductal carcinoma in situ of left breast: Secondary | ICD-10-CM | POA: Diagnosis not present

## 2017-06-07 DIAGNOSIS — N6489 Other specified disorders of breast: Secondary | ICD-10-CM | POA: Diagnosis not present

## 2017-06-07 DIAGNOSIS — C50311 Malignant neoplasm of lower-inner quadrant of right female breast: Secondary | ICD-10-CM | POA: Diagnosis not present

## 2017-06-07 DIAGNOSIS — M1990 Unspecified osteoarthritis, unspecified site: Secondary | ICD-10-CM | POA: Insufficient documentation

## 2017-06-07 DIAGNOSIS — C50011 Malignant neoplasm of nipple and areola, right female breast: Secondary | ICD-10-CM | POA: Diagnosis not present

## 2017-06-07 DIAGNOSIS — C50911 Malignant neoplasm of unspecified site of right female breast: Secondary | ICD-10-CM | POA: Diagnosis not present

## 2017-06-07 HISTORY — PX: BREAST LUMPECTOMY WITH SENTINEL LYMPH NODE BIOPSY: SHX5597

## 2017-06-07 HISTORY — PX: BREAST LUMPECTOMY: SHX2

## 2017-06-07 SURGERY — BREAST LUMPECTOMY WITH SENTINEL LYMPH NODE BX
Anesthesia: General | Site: Breast | Laterality: Right | Wound class: Clean

## 2017-06-07 MED ORDER — LACTATED RINGERS IV SOLN
INTRAVENOUS | Status: DC
  Administered 2017-06-07: 10:00:00 via INTRAVENOUS

## 2017-06-07 MED ORDER — FENTANYL CITRATE (PF) 100 MCG/2ML IJ SOLN: 25.0000 ug | INTRAMUSCULAR | Status: DC | PRN

## 2017-06-07 MED ORDER — ONDANSETRON HCL 4 MG/2ML IJ SOLN
INTRAMUSCULAR | Status: DC | PRN
  Administered 2017-06-07: 4 mg via INTRAVENOUS

## 2017-06-07 MED ORDER — ACETAMINOPHEN 10 MG/ML IV SOLN
INTRAVENOUS | Status: DC | PRN
  Administered 2017-06-07: 1000 mg via INTRAVENOUS

## 2017-06-07 MED ORDER — EPHEDRINE SULFATE 50 MG/ML IJ SOLN
INTRAMUSCULAR | Status: DC | PRN
  Administered 2017-06-07: 20 mg via INTRAVENOUS

## 2017-06-07 MED ORDER — LACTATED RINGERS IV SOLN
INTRAVENOUS | Status: DC | PRN
  Administered 2017-06-07: 12:00:00 via INTRAVENOUS

## 2017-06-07 MED ORDER — GABAPENTIN 300 MG PO CAPS
ORAL_CAPSULE | ORAL | Status: AC
  Administered 2017-06-07: 300 mg via ORAL
  Filled 2017-06-07: qty 1

## 2017-06-07 MED ORDER — HYDROCODONE-ACETAMINOPHEN 5-325 MG PO TABS: 1.0000 | ORAL_TABLET | ORAL | 0 refills | Status: DC | PRN

## 2017-06-07 MED ORDER — BUPIVACAINE-EPINEPHRINE (PF) 0.5% -1:200000 IJ SOLN
INTRAMUSCULAR | Status: AC
  Filled 2017-06-07: qty 30

## 2017-06-07 MED ORDER — PROPOFOL 10 MG/ML IV BOLUS
INTRAVENOUS | Status: DC | PRN
  Administered 2017-06-07: 120 mg via INTRAVENOUS

## 2017-06-07 MED ORDER — BUPIVACAINE-EPINEPHRINE (PF) 0.5% -1:200000 IJ SOLN
INTRAMUSCULAR | Status: DC | PRN
  Administered 2017-06-07: 20 mL
  Administered 2017-06-07: 10 mL

## 2017-06-07 MED ORDER — PROPOFOL 10 MG/ML IV BOLUS
INTRAVENOUS | Status: AC
  Filled 2017-06-07: qty 20

## 2017-06-07 MED ORDER — GABAPENTIN 300 MG PO CAPS
300.0000 mg | ORAL_CAPSULE | ORAL | Status: AC
  Administered 2017-06-07: 300 mg via ORAL

## 2017-06-07 MED ORDER — FENTANYL CITRATE (PF) 100 MCG/2ML IJ SOLN
INTRAMUSCULAR | Status: DC | PRN
  Administered 2017-06-07: 25 ug via INTRAVENOUS
  Administered 2017-06-07: 50 ug via INTRAVENOUS
  Administered 2017-06-07: 25 ug via INTRAVENOUS

## 2017-06-07 MED ORDER — LIDOCAINE HCL (CARDIAC) 20 MG/ML IV SOLN
INTRAVENOUS | Status: DC | PRN
  Administered 2017-06-07: 50 mg via INTRAVENOUS

## 2017-06-07 MED ORDER — CELECOXIB 200 MG PO CAPS
200.0000 mg | ORAL_CAPSULE | ORAL | Status: AC
  Administered 2017-06-07: 200 mg via ORAL

## 2017-06-07 MED ORDER — SODIUM CHLORIDE 0.9 % IJ SOLN
INTRAMUSCULAR | Status: AC
  Filled 2017-06-07: qty 10

## 2017-06-07 MED ORDER — METHYLENE BLUE 0.5 % INJ SOLN
INTRAVENOUS | Status: AC
  Filled 2017-06-07: qty 10

## 2017-06-07 MED ORDER — OXYCODONE HCL 5 MG/5ML PO SOLN: 5.0000 mg | Freq: Once | ORAL | Status: DC | PRN

## 2017-06-07 MED ORDER — PROMETHAZINE HCL 25 MG/ML IJ SOLN
6.2500 mg | INTRAMUSCULAR | Status: DC | PRN
  Administered 2017-06-07: 6.25 mg via INTRAVENOUS

## 2017-06-07 MED ORDER — PROMETHAZINE HCL 25 MG/ML IJ SOLN
INTRAMUSCULAR | Status: AC
  Filled 2017-06-07: qty 1

## 2017-06-07 MED ORDER — METHYLENE BLUE 0.5 % INJ SOLN
INTRAVENOUS | Status: DC | PRN
  Administered 2017-06-07: 4 mL via SUBMUCOSAL

## 2017-06-07 MED ORDER — DEXAMETHASONE SODIUM PHOSPHATE 10 MG/ML IJ SOLN
INTRAMUSCULAR | Status: DC | PRN
  Administered 2017-06-07: 5 mg via INTRAVENOUS

## 2017-06-07 MED ORDER — OXYCODONE HCL 5 MG PO TABS: 5.0000 mg | ORAL_TABLET | Freq: Once | ORAL | Status: DC | PRN

## 2017-06-07 MED ORDER — TECHNETIUM TC 99M SULFUR COLLOID FILTERED
1.0000 | Freq: Once | INTRAVENOUS | Status: AC | PRN
  Administered 2017-06-07: 1.09 via INTRADERMAL

## 2017-06-07 MED ORDER — CELECOXIB 200 MG PO CAPS
ORAL_CAPSULE | ORAL | Status: AC
  Administered 2017-06-07: 200 mg via ORAL
  Filled 2017-06-07: qty 1

## 2017-06-07 MED ORDER — FAMOTIDINE 20 MG PO TABS
20.0000 mg | ORAL_TABLET | Freq: Once | ORAL | Status: AC
  Administered 2017-06-07: 20 mg via ORAL

## 2017-06-07 MED ORDER — SEVOFLURANE IN SOLN
RESPIRATORY_TRACT | Status: AC
  Filled 2017-06-07: qty 250

## 2017-06-07 MED ORDER — PROMETHAZINE HCL 25 MG/ML IJ SOLN
INTRAMUSCULAR | Status: AC
  Administered 2017-06-07: 6.25 mg via INTRAVENOUS
  Filled 2017-06-07: qty 1

## 2017-06-07 MED ORDER — FENTANYL CITRATE (PF) 100 MCG/2ML IJ SOLN
INTRAMUSCULAR | Status: AC
  Filled 2017-06-07: qty 2

## 2017-06-07 MED ORDER — MEPERIDINE HCL 50 MG/ML IJ SOLN: 6.2500 mg | INTRAMUSCULAR | Status: DC | PRN

## 2017-06-07 MED ORDER — FAMOTIDINE 20 MG PO TABS
ORAL_TABLET | ORAL | Status: AC
  Administered 2017-06-07: 20 mg via ORAL
  Filled 2017-06-07: qty 1

## 2017-06-07 MED ORDER — KETOROLAC TROMETHAMINE 30 MG/ML IJ SOLN
INTRAMUSCULAR | Status: DC | PRN
  Administered 2017-06-07: 30 mg via INTRAVENOUS

## 2017-06-07 SURGICAL SUPPLY — 48 items
BANDAGE ELASTIC 6 LF NS (GAUZE/BANDAGES/DRESSINGS) ×2
BLADE SURG 15 STRL SS SAFETY (BLADE) ×4
BNDG GAUZE 4.5X4.1 6PLY STRL (MISCELLANEOUS) ×2
BULB RESERV EVAC DRAIN JP 100C (MISCELLANEOUS)
CANISTER SUCT 1200ML W/VALVE (MISCELLANEOUS) ×2
CHLORAPREP W/TINT 26ML (MISCELLANEOUS) ×2
CNTNR SPEC 2.5X3XGRAD LEK (MISCELLANEOUS) ×1
CONT SPEC 4OZ STER OR WHT (MISCELLANEOUS) ×1
COVER PROBE FLX POLY STRL (MISCELLANEOUS) ×2
DEVICE DUBIN SPECIMEN MAMMOGRA (MISCELLANEOUS) ×2
DRAIN CHANNEL JP 15F RND 16 (MISCELLANEOUS)
DRAPE LAPAROTOMY TRNSV 106X77 (MISCELLANEOUS) ×2
DRSG OPSITE POSTOP 3X4 (GAUZE/BANDAGES/DRESSINGS) ×2
DRSG OPSITE POSTOP 4X6 (GAUZE/BANDAGES/DRESSINGS) ×2
DRSG TELFA 3X8 NADH (GAUZE/BANDAGES/DRESSINGS) ×2 IMPLANT
ELECT CAUTERY BLADE TIP 2.5 (TIP) ×2
ELECT REM PT RETURN 9FT ADLT (ELECTROSURGICAL) ×2
GAUZE FLUFF 18X24 1PLY STRL (GAUZE/BANDAGES/DRESSINGS) ×2
GAUZE SPONGE 4X4 12PLY STRL (GAUZE/BANDAGES/DRESSINGS) ×2
GLOVE BIO SURGEON STRL SZ7.5 (GLOVE) ×2
GLOVE INDICATOR 8.0 STRL GRN (GLOVE) ×2
GOWN STRL REUS W/ TWL LRG LVL3 (GOWN DISPOSABLE) ×2
GOWN STRL REUS W/TWL LRG LVL3 (GOWN DISPOSABLE) ×2
KIT RM TURNOVER STRD PROC AR (KITS) ×2
LABEL OR SOLS (LABEL) ×2
MARGIN MAP 10MM (MISCELLANEOUS) ×2
NDL SAFETY 22GX1.5 (NEEDLE) ×2
NEEDLE HYPO 25X1 1.5 SAFETY (NEEDLE) ×4
PACK BASIN MINOR ARMC (MISCELLANEOUS) ×2
SHEARS FOC LG CVD HARMONIC 17C (MISCELLANEOUS)
SHEARS HARMONIC 9CM CVD (BLADE)
SLEVE PROBE SENORX GAMMA FIND (MISCELLANEOUS) ×2
STRIP CLOSURE SKIN 1/2X4 (GAUZE/BANDAGES/DRESSINGS) ×2
SUT ETHILON 3-0 FS-10 30 BLK (SUTURE) ×2
SUT SILK 2 0 (SUTURE) ×1
SUT SILK 2-0 18XBRD TIE 12 (SUTURE) ×1
SUT VIC AB 2-0 CT1 27 (SUTURE) ×3
SUT VIC AB 2-0 CT1 TAPERPNT 27 (SUTURE) ×3
SUT VIC AB 3-0 SH 27 (SUTURE) ×1
SUT VIC AB 3-0 SH 27X BRD (SUTURE) ×1
SUT VIC AB 4-0 FS2 27 (SUTURE) ×2
SUT VICRYL+ 3-0 144IN (SUTURE) ×2
SWABSTK COMLB BENZOIN TINCTURE (MISCELLANEOUS) ×2
SYR BULB IRRIG 60ML STRL (SYRINGE) ×2
SYR CONTROL 10ML (SYRINGE) ×2
SYRINGE 10CC LL (SYRINGE) ×2
TAPE TRANSPORE STRL 2 31045 (GAUZE/BANDAGES/DRESSINGS)
WATER STERILE IRR 1000ML POUR (IV SOLUTION) ×2

## 2017-06-07 NOTE — OR Nursing (Signed)
Phenergan 6.25mg  IV in 55ml Normal Saline given at 1530.

## 2017-06-07 NOTE — Transfer of Care (Signed)
Immediate Anesthesia Transfer of Care Note  Patient: Gwendolyn King  Procedure(s) Performed: BREAST LUMPECTOMY WITH SENTINEL LYMPH NODE BX AND WIRE LOCALIZATION (BRACKETING WIRE) (Right Breast)  Patient Location: PACU  Anesthesia Type:General  Level of Consciousness: awake, sedated and patient cooperative  Airway & Oxygen Therapy: Patient Spontanous Breathing and Patient connected to face mask oxygen  Post-op Assessment: Report given to RN, Post -op Vital signs reviewed and stable and Patient moving all extremities  Post vital signs: Reviewed and stable  Last Vitals:  Vitals:   06/07/17 1002 06/07/17 1411  BP: (!) 154/62 (!) 162/59  Pulse: (!) 57 66  Resp: 15 12  Temp: (!) 36.3 C   SpO2: 100% 98%    Last Pain:  Vitals:   06/07/17 1002  TempSrc: Oral  PainSc: 1          Complications: No apparent anesthesia complications

## 2017-06-07 NOTE — Discharge Instructions (Signed)

## 2017-06-07 NOTE — H&P (Signed)
No change in clinical exam or history. Tolerated wire localization tolerated well.  EMLA cream " did not last long enough" for SLN injection. For wide excision.

## 2017-06-07 NOTE — Anesthesia Post-op Follow-up Note (Signed)
Anesthesia QCDR form completed.        

## 2017-06-07 NOTE — Anesthesia Preprocedure Evaluation (Signed)
Anesthesia Evaluation  Patient identified by MRN, date of birth, ID band Patient awake    Reviewed: Allergy & Precautions, NPO status , Patient's Chart, lab work & pertinent test results  History of Anesthesia Complications Negative for: history of anesthetic complications  Airway Mallampati: II  TM Distance: >3 FB Neck ROM: Full    Dental no notable dental hx.    Pulmonary neg sleep apnea,    breath sounds clear to auscultation- rhonchi (-) wheezing      Cardiovascular Exercise Tolerance: Good hypertension, Pt. on medications (-) CAD, (-) Past MI and (-) Cardiac Stents  Rhythm:Regular Rate:Normal - Systolic murmurs and - Diastolic murmurs    Neuro/Psych negative neurological ROS  negative psych ROS   GI/Hepatic negative GI ROS, Neg liver ROS,   Endo/Other  neg diabetesHypothyroidism   Renal/GU negative Renal ROS     Musculoskeletal  (+) Arthritis ,   Abdominal (+) - obese,   Peds  Hematology negative hematology ROS (+)   Anesthesia Other Findings Past Medical History: No date: Allergy     Comment:  hay fever, fall allergies No date: Arthritis No date: Asthma     Comment:  as a child, dad was smoker 05/08/2017: Breast cancer ([place])     Comment:  DUCTAL CARCINOMA IN SITU (DCIS) WITH ONE FOCUS               SUSPICIOUS FOR invasive  No date: Chicken pox No date: Colon polyp No date: Constipation No date: Hyperlipidemia No date: Hypothyroidism No date: Palpitations No date: Thyroid disease   Reproductive/Obstetrics                             Anesthesia Physical Anesthesia Plan  ASA: II  Anesthesia Plan: General   Post-op Pain Management:    Induction: Intravenous  PONV Risk Score and Plan: 2 and Ondansetron and Dexamethasone  Airway Management Planned: LMA  Additional Equipment:   Intra-op Plan:   Post-operative Plan:   Informed Consent: I have reviewed the  patients History and Physical, chart, labs and discussed the procedure including the risks, benefits and alternatives for the proposed anesthesia with the patient or authorized representative who has indicated his/her understanding and acceptance.   Dental advisory given  Plan Discussed with: CRNA and Anesthesiologist  Anesthesia Plan Comments:         Anesthesia Quick Evaluation

## 2017-06-07 NOTE — Op Note (Addendum)
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  Postoperative diagnosis: Same.  Operative procedure: Wire localization ultrasound-guided biopsy of the papillary carcinoma and DCIS of the right breast with resection of the nipple and areola, mastoplasty. Sentinel lymph node biopsy  Operating surgeon: Charlie Pitter, M.D.  Anesthesia: Gen. by LMA, Marcaine 0.5%, 1-200,000 units epinephrine, 30 mL  Estimated blood loss: 10 mL.  Clinical note: This 76 year old woman had screening mammograms that showed a new area of calcifications and density in the lower inner quadrant of the right breast. Subsequent stereotactic biopsy showed evidence of DCIS with the possibility of microinvasion. Subsequent ultrasound showed a dilated duct in the retroareolar area of the breast at the 7:00 position and vacuum biopsy showed papillary carcinoma without a clear invasive component. The patient strongly desired breast conservation. She underwent needle localization this morning to encompass the broad area of microcalcifications and injection with technetium softener colloid for sentinel node biopsy.  Operative note: With the patient under adequate general anesthesia the breast chest neck so it was cleansed with ChloraPrep and draped. Ultrasound was used to outline the course of the localizing wires. The previously biopsied papilloma in the 7:00 position of the left breast was identified. Local anesthesia was infiltrated for postoperative analgesia. A circumareolar incision from the 4 to 8:00 position was made with a radial component at the 5:00 position. The skin was incised sharply remaining dissection completed with electrocautery. The area of the papilloma abutted the overlying areolar skin. A block of tissue approximately 5 x 6 x 9 cm in diameter was used to encompass both localizing wires and both previous biopsy sites.  This was at the level of the dermis near the areola and extending down approximately a centimeter and the remainder of the breast. The dissection carried down to and including the pectoralis fascia. Specimen radiograph showed both wire tips intact, the previously placed biopsy clip at the DCIS site and distortion in the tissue associated with the retroareolar breast from the papilloma excision.  While the breast was being processed for frozen section at the edge of the nipple tissue and attention was turned towards the axilla. The node seeker device was used and high counts were noted in the lower aspect of the axilla. Local anesthesia was infiltrated. A transverse incision was made and the remaining dissection completed with electrocautery. 3 nodes were identified, the first with counts of 4000, the second with counts of 500 and the third with counts of 1500. The first and third nodes were blue as well as hot. Scanning to the axilla after these 3 nodes were removed was unremarkable. All were of normal size. The axillary envelope was closed with a 2-0 Vicryl figure-of-eight suture. This adipose layer was approximated in a similar fashion. The skin was closed with a running 4-0 Vicryl subcuticular suture.  Pathology reported that there was papillary tumor abutting the resection line. With this information was elected to resect the areola from the 4 to 8:00 position including the nipple. This was pinned to a Telfa pad, orientated and sent in formalin for routine histology. The breast was elevated off the underlying pectoralis muscle from the sternum to the anterior axillary line and to about 3-4 cm above the level of the nipple. This allowed the pectoralis fascia to be approximated with interrupted 2-0 Vicryl figure-of-eight sutures. The breast parenchyma was then approximated with multiple layers of 2-0 Vicryl figure-of-eight sutures. The final layer of  subcutaneous fat was approximated with interrupted 2-0  Vicryl sutures. The skin was closed with a running 4-0 Vicryl subcuticular suture. Benzoin and Steri-Strips followed by honeycomb dressings were applied.   The patient tolerated the procedure well and was taken to the recovery room in stable condition.

## 2017-06-07 NOTE — Anesthesia Postprocedure Evaluation (Signed)
Anesthesia Post Note  Patient: Gwendolyn King  Procedure(s) Performed: BREAST LUMPECTOMY WITH SENTINEL LYMPH NODE BX AND WIRE LOCALIZATION (BRACKETING WIRE) (Right Breast)  Patient location during evaluation: PACU Anesthesia Type: General Level of consciousness: awake and alert and oriented Pain management: pain level controlled Vital Signs Assessment: post-procedure vital signs reviewed and stable Respiratory status: spontaneous breathing, nonlabored ventilation and respiratory function stable Cardiovascular status: blood pressure returned to baseline and stable Postop Assessment: no signs of nausea or vomiting Anesthetic complications: no     Last Vitals:  Vitals:   06/07/17 1441 06/07/17 1456  BP: (!) 147/56 (!) 161/59  Pulse: (!) 57 (!) 58  Resp: 12 16  Temp:    SpO2: 97% 98%    Last Pain:  Vitals:   06/07/17 1456  TempSrc:   PainSc: Asleep                 Riven Mabile

## 2017-06-07 NOTE — Anesthesia Procedure Notes (Signed)
Procedure Name: LMA Insertion Date/Time: 06/07/2017 12:53 PM Performed by: EYCXKGY, DAVID Pre-anesthesia Checklist: Patient identified, Emergency Drugs available, Timeout performed, Patient being monitored and Suction available Patient Re-evaluated:Patient Re-evaluated prior to induction Oxygen Delivery Method: Circle system utilized Preoxygenation: Pre-oxygenation with 100% oxygen Induction Type: IV induction Ventilation: Mask ventilation without difficulty LMA: LMA inserted LMA Size: 4.0 Number of attempts: 1 Placement Confirmation: positive ETCO2 and breath sounds checked- equal and bilateral Tube secured with: Tape Dental Injury: Teeth and Oropharynx as per pre-operative assessment

## 2017-06-08 ENCOUNTER — Encounter: Payer: Self-pay | Admitting: General Surgery

## 2017-06-10 ENCOUNTER — Telehealth: Payer: Self-pay | Admitting: General Surgery

## 2017-06-10 NOTE — Telephone Encounter (Signed)
The patient was notified that pathology showed the sentinel lymph nodes to be clear. No additional invasive cancer besides the microscopic foci identified during her initial stereotactic biopsy and the lateral margins being positive and 2 focal areas.  We'll review the implications for the positive margins and options for management at her upcoming follow-up scheduled  for 04/12/2017.  The patient reports minimal pain, more soreness.

## 2017-06-12 ENCOUNTER — Ambulatory Visit (INDEPENDENT_AMBULATORY_CARE_PROVIDER_SITE_OTHER): Payer: PPO | Admitting: General Surgery

## 2017-06-12 ENCOUNTER — Encounter: Payer: Self-pay | Admitting: General Surgery

## 2017-06-12 VITALS — BP 140/70 | HR 70 | Resp 14 | Ht 61.0 in | Wt 153.0 lb

## 2017-06-12 DIAGNOSIS — D0511 Intraductal carcinoma in situ of right breast: Secondary | ICD-10-CM

## 2017-06-12 NOTE — Progress Notes (Signed)
Patient ID: Gwendolyn King, female   DOB: 1941/02/25, 76 y.o.   MRN: [id number]  Chief Complaint  Patient presents with  . Routine Post Op    right lumpectomy     HPI Gwendolyn King is a 76 y.o. female here today for her post op right lumpectomy done on 06/07/2017. Patient states she is doing well.  HPI  Past Medical History:  Diagnosis Date  . Allergy    hay fever, fall allergies  . Arthritis   . Asthma    as a child, dad was smoker  . 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 Chicken pox   . Colon polyp   . Constipation   . Hyperlipidemia   . Hypothyroidism   . Palpitations   . Thyroid disease     Past Surgical History:  Procedure Laterality Date  . ABDOMINAL HYSTERECTOMY  1981  . APPENDECTOMY  2012  . 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 BREAST LUMPECTOMY Right 06/07/2017   DUCTAL CARCINOMA IN SITU (DCIS)  . BREAST LUMPECTOMY Right 06/07/2017   PAPILLARY LESION CONSISTENT WITH PAPILLARY CARCINOMA  . BREAST LUMPECTOMY WITH SENTINEL LYMPH NODE BIOPSY Right 06/07/2017   Procedure: BREAST LUMPECTOMY WITH SENTINEL LYMPH NODE BX AND WIRE LOCALIZATION (BRACKETING WIRE);  Surgeon: Robert Bellow, MD;  Location: [facility];  Service: General;  Laterality: Right;  . COLONOSCOPY    . COLONOSCOPY W/ POLYPECTOMY    . FINGER FRACTURE SURGERY Left    5th  . JOINT REPLACEMENT     right knee  . ROTATOR CUFF REPAIR Right   . TONSILLECTOMY AND ADENOIDECTOMY  1963  . TOTAL KNEE ARTHROPLASTY Right 10/17/2016   Procedure: TOTAL KNEE ARTHROPLASTY;  Surgeon: Melrose Nakayama, MD;  Location: [place];  Service: Orthopedics;  Laterality: Right;  Marland Kitchen VAGINAL DELIVERY     3    Family History  Problem Relation Age of Onset  . Arthritis Mother   . Heart disease Mother   . Hypertension Mother   . Arthritis Father   . Heart disease Father   .  Hypertension Father   . Stroke Maternal Grandmother   . Cancer Maternal Aunt        abdominal?  . Hyperlipidemia Maternal Aunt   . Diabetes Paternal Aunt   . Hyperlipidemia Other   . Diabetes Other   . Breast cancer Neg Hx     Social History Social History  Substance Use Topics  . Smoking status: Never Smoker  . Smokeless tobacco: Never Used  . Alcohol use No    Allergies  Allergen Reactions  . Lipitor [Atorvastatin] Other (See Comments)    Myalgia, arthralgia  . Simvastatin Other (See Comments)    myalgia    Current Outpatient Prescriptions  Medication Sig Dispense Refill  . acebutolol (SECTRAL) 200 MG capsule Take 1 capsule (200 mg total) by mouth daily. 90 capsule 3  . acetaminophen (TYLENOL) 500 MG tablet Take 1,000 mg by mouth at bedtime.    Marland Kitchen aspirin EC 81 MG tablet Take 81 mg by mouth daily.    . Biotin (BIOTIN 5000) 5 MG CAPS Take 5 mg by mouth daily.     . Cholecalciferol (VITAMIN D-3) 1000 UNITS CAPS Take 1,000 Units by mouth every evening.     . fexofenadine (ALLEGRA) 180 MG tablet Take 180 mg by mouth every  evening.     . furosemide (LASIX) 20 MG tablet Take 1 tablet (20 mg total) by mouth daily. (Patient taking differently: Take 20 mg by mouth daily as needed for fluid or edema. ) 90 tablet 3  . KRILL OIL PO Take 1 capsule by mouth daily.     Marland Kitchen LEVOXYL 75 MCG tablet TAKE 1 TABLET ONE TIME DAILY 90 tablet 3  . lidocaine-prilocaine (EMLA) cream Apply 1 application topically as needed. 5 g 0  . polyethylene glycol (MIRALAX / GLYCOLAX) packet Take 17 g by mouth daily.     No current facility-administered medications for this visit.     Review of Systems Review of Systems  Constitutional: Negative.   Respiratory: Negative.   Cardiovascular: Negative.     Blood pressure 140/70, pulse 70, resp. rate 14, height _0  (1.549 m), weight 153 lb (69.4 kg).  Physical Exam Physical Exam  Constitutional: She is oriented to person, place, and time. She appears  well-developed and well-nourished.  Pulmonary/Chest:        Neurological: She is alert and oriented to person, place, and time.  Skin: Skin is warm and dry.    Data Reviewed DIAGNOSIS:  A. 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  - DCIS INVOLVES CUT-THROUGH DUCTS AT ANTERIOR, LATERAL, AND INFERIOR  MARGINS OF THE ANTERIOR (RETROAREOLAR) LESION, SEE COMMENT.  - THE MARGINS OF THE POSTERIOR LESION ARE UNINVOLVED.  - TWO BIOPSY SITES WITH MARKERS IDENTIFIED.   B. SENTINEL LYMPH NODES #1, #2, #3, RIGHT AXILLA; EXCISION:  - NEGATIVE FOR MALIGNANCY, THREE LYMPH NODES (0/3).   C. RIGHT BREAST, ANTERIOR MARGIN, AREOLA AND NIPPLE; EXCISION:  - ONE CAUTERIZED SUBAREOLAR DUCT WITH ATYPIA LESS THAN 1 MM FROM DEEP  EDGE.  - PERIPHERAL MARGINS ARE FREE OF ATYPIA AND MALIGNANCY (ALL TISSUE  SUBMITTED FOR HISTOLOGY).  ER/PR/HER-2/neu pending.  Size of specimen: 12.0 (anterior-posterior) x 8.5 (medial-lateral) x 3.2 cm.  Assessment    Doing well status post 2 foci of high-grade DCIS each measuring at least 2 cm in diameter.  Deep lesion with clear margins, superficial lesion involving a papillary intraductal carcinoma with positive lateral inferior margin. (Anterior positive margin reexcised to include the nipple-areolar complex.)    Plan    Options for management reviewed: 1) mastectomy with or without immediate reconstruction to clear all margins (unpleasant option from the start from the patient's point of view); 2). Attempted reexcision of the involved margins, this would require significant delay for the ecchymosis presently noted to be resolved and could result in significant distortion of the breast mound versus 3) proceed to whole breast radiation understanding with the positive margin the risk of local recurrence troubles from approximately 6-12%  (if hormone receptor negative), 3-6% (if hormone receptive positive and she takes an antiestrogen.)    We'll make arrangements for the patient me with radiation oncology in about 3 weeks for their assessment, by which time I expect most of the ecchymosis to have resolved.  The use of a heating pad, taking care to avoid excessive heat may accelerate resolution of the ecchymosis.  Patient to return in two week. The patient is aware to call back for any questions or concerns.   HPI, Physical Exam, Assessment and Plan have been scribed under the direction and in the presence of Hervey Ard, MD.  Gaspar Cola, CMA  I have completed the exam  and reviewed the above documentation for accuracy and completeness.  I agree with the above.  Haematologist has been used and any errors in dictation or transcription are unintentional.  Hervey Ard, M.D., F.A.C.S.  Robert Bellow 06/12/2017, 8:50 PM

## 2017-06-12 NOTE — Patient Instructions (Addendum)
Patient to return in two week

## 2017-06-13 ENCOUNTER — Other Ambulatory Visit: Payer: Self-pay

## 2017-06-13 DIAGNOSIS — D0581 Other specified type of carcinoma in situ of right breast: Secondary | ICD-10-CM

## 2017-06-13 DIAGNOSIS — D0511 Intraductal carcinoma in situ of right breast: Secondary | ICD-10-CM

## 2017-06-13 LAB — SURGICAL PATHOLOGY

## 2017-06-26 ENCOUNTER — Encounter: Payer: Self-pay | Admitting: General Surgery

## 2017-06-26 ENCOUNTER — Ambulatory Visit (INDEPENDENT_AMBULATORY_CARE_PROVIDER_SITE_OTHER): Payer: PPO | Admitting: General Surgery

## 2017-06-26 VITALS — BP 144/62 | HR 66 | Resp 12 | Ht 61.0 in | Wt 150.0 lb

## 2017-06-26 DIAGNOSIS — D0511 Intraductal carcinoma in situ of right breast: Secondary | ICD-10-CM

## 2017-06-26 NOTE — Progress Notes (Signed)
Patient ID: Gwendolyn King, female   DOB: November 15, 1940, 76 y.o.   MRN: [id number]  Chief Complaint  Patient presents with  . Routine Post Op    HPI Gwendolyn King is a 76 y.o. female.  Here today for postoperative visit, right breast lumpectomy she states she is doing well, discomfort is the inner portion of breast and lateral portion. Bruising has improved. Denies any gastrointestinal issues, bowels are moving regular.  HPI  Past Medical History:  Diagnosis Date  . Allergy    hay fever, fall allergies  . Arthritis   . Asthma    as a child, dad was smoker  . 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  . Chicken pox   . Colon polyp   . Constipation   . Hyperlipidemia   . Hypothyroidism   . Palpitations   . Thyroid disease     Past Surgical History:  Procedure Laterality Date  . ABDOMINAL HYSTERECTOMY  1981  . APPENDECTOMY  2012  . 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 BREAST LUMPECTOMY Right 06/07/2017   DUCTAL CARCINOMA IN SITU (DCIS)  . BREAST LUMPECTOMY Right 06/07/2017   PAPILLARY LESION CONSISTENT WITH PAPILLARY CARCINOMA  . COLONOSCOPY    . COLONOSCOPY W/ POLYPECTOMY    . FINGER FRACTURE SURGERY Left    5th  . JOINT REPLACEMENT     right knee  . ROTATOR CUFF REPAIR Right   . TONSILLECTOMY AND ADENOIDECTOMY  1963  . VAGINAL DELIVERY     3    Family History  Problem Relation Age of Onset  . Arthritis Mother   . Heart disease Mother   . Hypertension Mother   . Arthritis Father   . Heart disease Father   . Hypertension Father   . Stroke Maternal Grandmother   . Cancer Maternal Aunt        abdominal?  . Hyperlipidemia Maternal Aunt   . Diabetes Paternal Aunt   . Hyperlipidemia Other   . Diabetes Other   . Breast cancer Neg Hx     Social History Social History   Tobacco Use  . Smoking  status: Never Smoker  . Smokeless tobacco: Never Used  Substance Use Topics  . Alcohol use: No  . Drug use: No    Allergies  Allergen Reactions  . Lipitor [Atorvastatin] Other (See Comments)    Myalgia, arthralgia  . Simvastatin Other (See Comments)    myalgia    Current Outpatient Medications  Medication Sig Dispense Refill  . acebutolol (SECTRAL) 200 MG capsule Take 1 capsule (200 mg total) by mouth daily. 90 capsule 3  . acetaminophen (TYLENOL) 500 MG tablet Take 1,000 mg by mouth at bedtime.    Marland Kitchen aspirin EC 81 MG tablet Take 81 mg by mouth daily.    . Biotin (BIOTIN 5000) 5 MG CAPS Take 5 mg by mouth daily.     . Cholecalciferol (VITAMIN D-3) 1000 UNITS CAPS Take 1,000 Units by mouth every evening.     . fexofenadine (ALLEGRA) 180 MG tablet Take 180 mg by mouth every evening.     . furosemide (LASIX) 20 MG tablet Take 1 tablet (20 mg total) by mouth daily. (Patient taking differently: Take 20 mg by mouth daily as needed for fluid or edema. ) 90 tablet 3  . KRILL OIL PO Take 1  capsule by mouth daily.     Marland Kitchen LEVOXYL 75 MCG tablet TAKE 1 TABLET ONE TIME DAILY 90 tablet 3  . lidocaine-prilocaine (EMLA) cream Apply 1 application topically as needed. 5 g 0  . polyethylene glycol (MIRALAX / GLYCOLAX) packet Take 17 g by mouth daily.     No current facility-administered medications for this visit.     Review of Systems Review of Systems  Constitutional: Negative.   Respiratory: Negative.   Cardiovascular: Negative.     Blood pressure (!) 144/62, pulse 66, resp. rate 12, height '5\' 1"'$  (1.549 m), weight 150 lb (68 kg), SpO2 100 %.  Physical Exam Physical Exam  Constitutional: She is oriented to person, place, and time. She appears well-developed and well-nourished.  Pulmonary/Chest:    Right breast incisions clean.  Neurological: She is alert and oriented to person, place, and time.  Skin: Skin is warm and dry.  Psychiatric: Her behavior is normal.    Data  Reviewed DIAGNOSIS:  A. 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  - DCIS INVOLVES CUT-THROUGH DUCTS AT ANTERIOR, LATERAL, AND INFERIOR  MARGINS OF THE ANTERIOR (RETROAREOLAR) LESION, SEE COMMENT.  - THE MARGINS OF THE POSTERIOR LESION ARE UNINVOLVED.  - TWO BIOPSY SITES WITH MARKERS IDENTIFIED.   B. SENTINEL LYMPH NODES #1, #2, #3, RIGHT AXILLA; EXCISION:  - NEGATIVE FOR MALIGNANCY, THREE LYMPH NODES (0/3).   C. RIGHT BREAST, ANTERIOR MARGIN, AREOLA AND NIPPLE; EXCISION:  - ONE CAUTERIZED SUBAREOLAR DUCT WITH ATYPIA LESS THAN 1 MM FROM DEEP  EDGE.  - PERIPHERAL MARGINS ARE FREE OF ATYPIA AND MALIGNANCY (ALL TISSUE  SUBMITTED FOR HISTOLOGY).   Pathology summery: pT69m pN0(sn)   BREAST BIOMARKER TEST RESULTS:  Estrogen Receptor (ER) Status: POSITIVE, >90% of cells with nuclear  positivity     Average intensity of staining: Strong  Progesterone Receptor (PgR) Status: NEGATIVE, <1% of cells with nuclear  positivity     Internal control cells present and stain as expected  HER2 (by immunohistochemistry): NEGATIVE (Score 1+)   Assessment    Doing well status post wide excision, good breast volume preservation.    Plan    We reviewed the pros and cons of re-excising to negative margins versus mastectomy with or without reexcision versus observation. At this time the patient desires only observation after her radiation is completed and initiation of antiestrogen therapy.    Follow up in 3 months which time we'll discuss initiation of antiestrogen therapy.  Referral to radiation oncology undertaken. Appointment scheduled for 07/02/2017 to review indications for whole breast radiation. . The patient is aware to call back for any questions or concerns.   HPI, Physical Exam, Assessment and Plan have been scribed under the direction and in  the presence of JRobert Bellow MD. MKarie Fetch RN  I have completed the exam and reviewed the above documentation for accuracy and completeness.  I agree with the above.  DHaematologisthas been used and any errors in dictation or transcription are unintentional.  JHervey Ard M.D., F.A.C.S.  BRobert Bellow11/02/2017, 7:57 PM

## 2017-06-26 NOTE — Patient Instructions (Signed)
The patient is aware to call back for any questions or concerns.  

## 2017-06-27 ENCOUNTER — Encounter: Payer: Self-pay | Admitting: General Surgery

## 2017-07-02 ENCOUNTER — Ambulatory Visit
Admission: RE | Admit: 2017-07-02 | Discharge: 2017-07-02 | Disposition: A | Discharge status: Home / Self Care | Payer: PPO | Source: Ambulatory Visit | Attending: Radiation Oncology | Admitting: Radiation Oncology

## 2017-07-02 ENCOUNTER — Encounter: Payer: Self-pay | Admitting: Radiation Oncology

## 2017-07-02 VITALS — BP 131/49 | HR 62 | Temp 95.7°F | Resp 20 | Ht 61.0 in | Wt 152.0 lb

## 2017-07-02 DIAGNOSIS — D0511 Intraductal carcinoma in situ of right breast: Secondary | ICD-10-CM | POA: Insufficient documentation

## 2017-07-02 DIAGNOSIS — Z170 Estrogen receptor positive status [ER+]: Secondary | ICD-10-CM | POA: Diagnosis not present

## 2017-07-02 DIAGNOSIS — Z8619 Personal history of other infectious and parasitic diseases: Secondary | ICD-10-CM | POA: Insufficient documentation

## 2017-07-02 DIAGNOSIS — M129 Arthropathy, unspecified: Secondary | ICD-10-CM | POA: Insufficient documentation

## 2017-07-02 DIAGNOSIS — E039 Hypothyroidism, unspecified: Secondary | ICD-10-CM | POA: Insufficient documentation

## 2017-07-02 DIAGNOSIS — Z86010 Personal history of colonic polyps: Secondary | ICD-10-CM | POA: Insufficient documentation

## 2017-07-02 DIAGNOSIS — Z79899 Other long term (current) drug therapy: Secondary | ICD-10-CM | POA: Insufficient documentation

## 2017-07-02 DIAGNOSIS — E785 Hyperlipidemia, unspecified: Secondary | ICD-10-CM | POA: Insufficient documentation

## 2017-07-02 DIAGNOSIS — Z809 Family history of malignant neoplasm, unspecified: Secondary | ICD-10-CM | POA: Diagnosis not present

## 2017-07-02 DIAGNOSIS — K5900 Constipation, unspecified: Secondary | ICD-10-CM | POA: Insufficient documentation

## 2017-07-02 DIAGNOSIS — Z7982 Long term (current) use of aspirin: Secondary | ICD-10-CM | POA: Insufficient documentation

## 2017-07-02 DIAGNOSIS — Z510 Encounter for antineoplastic radiation therapy: Secondary | ICD-10-CM | POA: Insufficient documentation

## 2017-07-02 DIAGNOSIS — R002 Palpitations: Secondary | ICD-10-CM | POA: Insufficient documentation

## 2017-07-02 NOTE — Consult Note (Signed)
NEW PATIENT EVALUATION  Name: Gwendolyn King  MRN: [id number]  Date:   07/02/2017     DOB: [birth year]   This 76 y.o. 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 Breast Cancer    Pt is here for initial consultation of breast cancer      DIAGNOSIS: The encounter diagnosis was Ductal carcinoma in situ (DCIS) of right breast.   PREVIOUS INVESTIGATIONS:  Mammogram and ultrasound reviewed Pathology reports reviewed Clinical notes reviewed  HPI: Patient is a 76 year old female who presented with an abnormal mammogram of her right breast showing asymmetry within the slight outer inferior right breast with associated faint calcifications measuring 5 x 4.9 x 3 cm. This was confirmed on ultrasound and targeted ultrasound was positive for a papillary lesion consistent with papillary carcinoma. She went on to have a wide local excision and sentinel node biopsy showing extensive ductal carcinoma in situ intermediate to high-grade with micropapillary and papillary architecture in 2 foci. Original resection showed cut through of DOCS anterior lateral and inferior margins although on reexcision including areola and nipple there was one cauterized margin with atypia less than 1 mm from deep margin. Margins were otherwise clear and 3 sentinel lymph nodes all were negative for malignancy. She is done well postoperatively although still somewhat sore and tender in the breast. Tumor was ER positive PR negative HER-2/neu negative. She is now referred to radiation oncology for opinion.  PLANNED TREATMENT REGIMEN: Whole breast radiation  PAST MEDICAL HISTORY:  has a past medical history of  Allergy, Arthritis, Asthma, Breast cancer ([place]) (05/08/2017), Chicken pox, Colon polyp, Constipation, Hyperlipidemia, Hypothyroidism, Palpitations, and Thyroid disease.    PAST SURGICAL HISTORY:  Past Surgical History:  Procedure Laterality Date  . ABDOMINAL HYSTERECTOMY  1981  . APPENDECTOMY  2012  . 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 BREAST LUMPECTOMY Right 06/07/2017   DUCTAL CARCINOMA IN SITU (DCIS)  . BREAST LUMPECTOMY Right 06/07/2017   PAPILLARY LESION CONSISTENT WITH PAPILLARY CARCINOMA  . COLONOSCOPY    . COLONOSCOPY W/ POLYPECTOMY    . FINGER FRACTURE SURGERY Left    5th  . JOINT REPLACEMENT     right knee  . ROTATOR CUFF REPAIR Right   . TONSILLECTOMY AND ADENOIDECTOMY  1963  . VAGINAL DELIVERY     3    FAMILY HISTORY: family history includes Arthritis in her father and mother; Cancer in her maternal aunt; Diabetes in her other and paternal aunt; Heart disease in her father and mother; Hyperlipidemia in her maternal aunt and other; Hypertension in her father and mother; Stroke in her maternal grandmother.  SOCIAL HISTORY:  reports that  has never smoked. she has never used smokeless tobacco. She reports that she does not drink alcohol or use drugs.  ALLERGIES: Lipitor [atorvastatin] and Simvastatin  MEDICATIONS:  Current Outpatient Medications  Medication Sig Dispense Refill  . acebutolol (SECTRAL) 200 MG capsule Take 1 capsule (200 mg total) by mouth daily. 90 capsule 3  . acetaminophen (TYLENOL) 500 MG tablet Take 1,000 mg by mouth at bedtime.    Marland Kitchen aspirin EC 81 MG tablet Take 81  mg by mouth daily.    . Biotin (BIOTIN 5000) 5 MG CAPS Take 5 mg by mouth daily.     . Cholecalciferol (VITAMIN D-3) 1000 UNITS CAPS Take 1,000 Units by mouth every evening.     . fexofenadine (ALLEGRA) 180 MG tablet Take 180 mg by mouth every evening.     . furosemide (LASIX) 20 MG tablet Take 1 tablet (20  mg total) by mouth daily. (Patient taking differently: Take 20 mg by mouth daily as needed for fluid or edema. ) 90 tablet 3  . KRILL OIL PO Take 1 capsule by mouth daily.     Marland Kitchen LEVOXYL 75 MCG tablet TAKE 1 TABLET ONE TIME DAILY 90 tablet 3  . lidocaine-prilocaine (EMLA) cream Apply 1 application topically as needed. 5 g 0  . polyethylene glycol (MIRALAX / GLYCOLAX) packet Take 17 g by mouth daily.     No current facility-administered medications for this encounter.     ECOG PERFORMANCE STATUS:  0 - Asymptomatic  REVIEW OF SYSTEMS: Except for the breast tenderness Patient denies any weight loss, fatigue, weakness, fever, chills or night sweats. Patient denies any loss of vision, blurred vision. Patient denies any ringing  of the ears or hearing loss. No irregular heartbeat. Patient denies heart murmur or history of fainting. Patient denies any chest pain or pain radiating to her upper extremities. Patient denies any shortness of breath, difficulty breathing at night, cough or hemoptysis. Patient denies any swelling in the lower legs. Patient denies any nausea vomiting, vomiting of blood, or coffee ground material in the vomitus. Patient denies any stomach pain. Patient states has had normal bowel movements no significant constipation or diarrhea. Patient denies any dysuria, hematuria or significant nocturia. Patient denies any problems walking, swelling in the joints or loss of balance. Patient denies any skin changes, loss of hair or loss of weight. Patient denies any excessive worrying or anxiety or significant depression. Patient denies any problems with insomnia. Patient denies excessive thirst, polyuria, polydipsia. Patient denies any swollen glands, patient denies easy bruising or easy bleeding. Patient denies any recent infections, allergies or URI. Patient "s visual fields have not changed significantly in recent time.    PHYSICAL EXAM: BP (!) 131/49   Pulse 62   Temp (!) 95.7 F (35.4 C)    Resp 20   Ht '5\' 1"'$  (1.549 m)   Wt 152 lb 0.1 oz (68.9 kg)   BMI 28.72 kg/m  She status post wide local excision of the right breast with partial sacrifice the nipple areolar complex. No dominant mass or nodularity is noted in either breast in 2 positions examined. No axillary or supra clavicular adenopathy is appreciated. Well-developed well-nourished patient in NAD. HEENT reveals PERLA, EOMI, discs not visualized.  Oral cavity is clear. No oral mucosal lesions are identified. Neck is clear without evidence of cervical or supraclavicular adenopathy. Lungs are clear to A&P. Cardiac examination is essentially unremarkable with regular rate and rhythm without murmur rub or thrill. Abdomen is benign with no organomegaly or masses noted. Motor sensory and DTR levels are equal and symmetric in the upper and lower extremities. Cranial nerves II through XII are grossly intact. Proprioception is intact. No peripheral adenopathy or edema is identified. No motor or sensory levels are noted. Crude visual fields are within normal range.  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 We may be able to do this and hypofractionated course of treatment over 4 weeks. Based on the close margin would like to boost her scar another 1600 cGy using electron beam. Risks and benefits of treatment including skin reaction fatigue alteration of blood counts possible inclusion of superficial lung all were discussed in detail with the patient. She also will be candidate for antiestrogen therapy after completion of radiation. I've personally set up and ordered CT simulation for early next week to allow some more healing and alleviate some of her tenderness. Patient and  husband seem to comprehend my treatment plan well.  I would like to take this opportunity to thank you for allowing me to participate in the care of your patient.Armstead Peaks., MD

## 2017-07-05 ENCOUNTER — Ambulatory Visit
Admission: RE | Admit: 2017-07-05 | Discharge: 2017-07-05 | Disposition: A | Discharge status: Home / Self Care | Payer: PPO | Source: Ambulatory Visit | Attending: Radiation Oncology | Admitting: Radiation Oncology

## 2017-07-05 DIAGNOSIS — Z170 Estrogen receptor positive status [ER+]: Secondary | ICD-10-CM | POA: Diagnosis not present

## 2017-07-05 DIAGNOSIS — D0511 Intraductal carcinoma in situ of right breast: Secondary | ICD-10-CM | POA: Diagnosis not present

## 2017-07-05 DIAGNOSIS — Z510 Encounter for antineoplastic radiation therapy: Secondary | ICD-10-CM | POA: Diagnosis not present

## 2017-07-06 DIAGNOSIS — D0511 Intraductal carcinoma in situ of right breast: Secondary | ICD-10-CM | POA: Diagnosis not present

## 2017-07-06 DIAGNOSIS — Z510 Encounter for antineoplastic radiation therapy: Secondary | ICD-10-CM | POA: Diagnosis not present

## 2017-07-06 DIAGNOSIS — Z170 Estrogen receptor positive status [ER+]: Secondary | ICD-10-CM | POA: Diagnosis not present

## 2017-07-11 ENCOUNTER — Other Ambulatory Visit: Payer: Self-pay | Admitting: *Deleted

## 2017-07-11 DIAGNOSIS — D0511 Intraductal carcinoma in situ of right breast: Secondary | ICD-10-CM

## 2017-07-16 ENCOUNTER — Ambulatory Visit
Admission: RE | Admit: 2017-07-16 | Discharge: 2017-07-16 | Disposition: A | Discharge status: Home / Self Care | Payer: PPO | Source: Ambulatory Visit | Attending: Radiation Oncology | Admitting: Radiation Oncology

## 2017-07-16 DIAGNOSIS — Z170 Estrogen receptor positive status [ER+]: Secondary | ICD-10-CM | POA: Diagnosis not present

## 2017-07-16 DIAGNOSIS — D0511 Intraductal carcinoma in situ of right breast: Secondary | ICD-10-CM | POA: Diagnosis not present

## 2017-07-16 DIAGNOSIS — Z510 Encounter for antineoplastic radiation therapy: Secondary | ICD-10-CM | POA: Diagnosis not present

## 2017-07-17 ENCOUNTER — Ambulatory Visit
Admission: RE | Admit: 2017-07-17 | Discharge: 2017-07-17 | Disposition: A | Discharge status: Home / Self Care | Payer: PPO | Source: Ambulatory Visit | Attending: Radiation Oncology | Admitting: Radiation Oncology

## 2017-07-17 DIAGNOSIS — D0511 Intraductal carcinoma in situ of right breast: Secondary | ICD-10-CM | POA: Diagnosis not present

## 2017-07-17 DIAGNOSIS — Z170 Estrogen receptor positive status [ER+]: Secondary | ICD-10-CM | POA: Diagnosis not present

## 2017-07-17 DIAGNOSIS — Z510 Encounter for antineoplastic radiation therapy: Secondary | ICD-10-CM | POA: Diagnosis not present

## 2017-07-18 ENCOUNTER — Ambulatory Visit
Admission: RE | Admit: 2017-07-18 | Discharge: 2017-07-18 | Disposition: A | Discharge status: Home / Self Care | Payer: PPO | Source: Ambulatory Visit | Attending: Radiation Oncology | Admitting: Radiation Oncology

## 2017-07-18 DIAGNOSIS — Z510 Encounter for antineoplastic radiation therapy: Secondary | ICD-10-CM | POA: Diagnosis not present

## 2017-07-18 DIAGNOSIS — D0511 Intraductal carcinoma in situ of right breast: Secondary | ICD-10-CM | POA: Diagnosis not present

## 2017-07-18 DIAGNOSIS — Z170 Estrogen receptor positive status [ER+]: Secondary | ICD-10-CM | POA: Diagnosis not present

## 2017-07-19 ENCOUNTER — Ambulatory Visit
Admission: RE | Admit: 2017-07-19 | Discharge: 2017-07-19 | Disposition: A | Discharge status: Home / Self Care | Payer: PPO | Source: Ambulatory Visit | Attending: Radiation Oncology | Admitting: Radiation Oncology

## 2017-07-19 DIAGNOSIS — D0511 Intraductal carcinoma in situ of right breast: Secondary | ICD-10-CM | POA: Diagnosis not present

## 2017-07-19 DIAGNOSIS — Z170 Estrogen receptor positive status [ER+]: Secondary | ICD-10-CM | POA: Diagnosis not present

## 2017-07-19 DIAGNOSIS — Z510 Encounter for antineoplastic radiation therapy: Secondary | ICD-10-CM | POA: Diagnosis not present

## 2017-07-20 ENCOUNTER — Ambulatory Visit
Admission: RE | Admit: 2017-07-20 | Discharge: 2017-07-20 | Disposition: A | Discharge status: Home / Self Care | Payer: PPO | Source: Ambulatory Visit | Attending: Radiation Oncology | Admitting: Radiation Oncology

## 2017-07-20 DIAGNOSIS — D0511 Intraductal carcinoma in situ of right breast: Secondary | ICD-10-CM | POA: Diagnosis not present

## 2017-07-20 DIAGNOSIS — Z170 Estrogen receptor positive status [ER+]: Secondary | ICD-10-CM | POA: Diagnosis not present

## 2017-07-20 DIAGNOSIS — Z510 Encounter for antineoplastic radiation therapy: Secondary | ICD-10-CM | POA: Diagnosis not present

## 2017-07-23 ENCOUNTER — Ambulatory Visit
Admission: RE | Admit: 2017-07-23 | Discharge: 2017-07-23 | Disposition: A | Discharge status: Home / Self Care | Payer: PPO | Source: Ambulatory Visit | Attending: Radiation Oncology | Admitting: Radiation Oncology

## 2017-07-23 DIAGNOSIS — Z510 Encounter for antineoplastic radiation therapy: Secondary | ICD-10-CM | POA: Diagnosis not present

## 2017-07-23 DIAGNOSIS — Z170 Estrogen receptor positive status [ER+]: Secondary | ICD-10-CM | POA: Diagnosis not present

## 2017-07-23 DIAGNOSIS — D0511 Intraductal carcinoma in situ of right breast: Secondary | ICD-10-CM | POA: Diagnosis not present

## 2017-07-24 ENCOUNTER — Ambulatory Visit
Admission: RE | Admit: 2017-07-24 | Discharge: 2017-07-24 | Disposition: A | Discharge status: Home / Self Care | Payer: PPO | Source: Ambulatory Visit | Attending: Radiation Oncology | Admitting: Radiation Oncology

## 2017-07-24 DIAGNOSIS — Z510 Encounter for antineoplastic radiation therapy: Secondary | ICD-10-CM | POA: Diagnosis not present

## 2017-07-25 ENCOUNTER — Ambulatory Visit
Admission: RE | Admit: 2017-07-25 | Discharge: 2017-07-25 | Disposition: A | Discharge status: Home / Self Care | Payer: PPO | Source: Ambulatory Visit | Attending: Radiation Oncology | Admitting: Radiation Oncology

## 2017-07-25 DIAGNOSIS — Z170 Estrogen receptor positive status [ER+]: Secondary | ICD-10-CM | POA: Diagnosis not present

## 2017-07-25 DIAGNOSIS — D0511 Intraductal carcinoma in situ of right breast: Secondary | ICD-10-CM | POA: Diagnosis not present

## 2017-07-25 DIAGNOSIS — Z510 Encounter for antineoplastic radiation therapy: Secondary | ICD-10-CM | POA: Diagnosis not present

## 2017-07-26 ENCOUNTER — Ambulatory Visit
Admission: RE | Admit: 2017-07-26 | Discharge: 2017-07-26 | Disposition: A | Discharge status: Home / Self Care | Payer: PPO | Source: Ambulatory Visit | Attending: Radiation Oncology | Admitting: Radiation Oncology

## 2017-07-26 DIAGNOSIS — D0511 Intraductal carcinoma in situ of right breast: Secondary | ICD-10-CM | POA: Diagnosis not present

## 2017-07-26 DIAGNOSIS — Z170 Estrogen receptor positive status [ER+]: Secondary | ICD-10-CM | POA: Diagnosis not present

## 2017-07-26 DIAGNOSIS — Z510 Encounter for antineoplastic radiation therapy: Secondary | ICD-10-CM | POA: Diagnosis not present

## 2017-07-27 ENCOUNTER — Ambulatory Visit
Admission: RE | Admit: 2017-07-27 | Discharge: 2017-07-27 | Disposition: A | Discharge status: Home / Self Care | Payer: PPO | Source: Ambulatory Visit | Attending: Radiation Oncology | Admitting: Radiation Oncology

## 2017-07-27 ENCOUNTER — Other Ambulatory Visit: Payer: Self-pay

## 2017-07-27 ENCOUNTER — Ambulatory Visit: Payer: PPO | Admitting: Family Medicine

## 2017-07-27 ENCOUNTER — Encounter: Payer: Self-pay | Admitting: Family Medicine

## 2017-07-27 VITALS — BP 132/62 | HR 59 | Temp 97.5°F | Wt 149.8 lb

## 2017-07-27 DIAGNOSIS — Z13220 Encounter for screening for lipoid disorders: Secondary | ICD-10-CM

## 2017-07-27 DIAGNOSIS — Z170 Estrogen receptor positive status [ER+]: Secondary | ICD-10-CM | POA: Diagnosis not present

## 2017-07-27 DIAGNOSIS — E039 Hypothyroidism, unspecified: Secondary | ICD-10-CM | POA: Diagnosis not present

## 2017-07-27 DIAGNOSIS — I10 Essential (primary) hypertension: Secondary | ICD-10-CM

## 2017-07-27 DIAGNOSIS — D0511 Intraductal carcinoma in situ of right breast: Secondary | ICD-10-CM | POA: Diagnosis not present

## 2017-07-27 DIAGNOSIS — E782 Mixed hyperlipidemia: Secondary | ICD-10-CM | POA: Diagnosis not present

## 2017-07-27 DIAGNOSIS — Z510 Encounter for antineoplastic radiation therapy: Secondary | ICD-10-CM | POA: Diagnosis not present

## 2017-07-27 NOTE — Patient Instructions (Signed)
Nice to see you. We will try to get your lab work when you are at the cancer center.

## 2017-07-27 NOTE — Assessment & Plan Note (Signed)
Currently undergoing radiation therapy.  Status post lumpectomy.  I discussed without a family history of breast or ovarian cancer genetic testing would be less likely to provide any benefit.  Offered referral to genetic counselor though this was deferred by the patient.

## 2017-07-27 NOTE — Assessment & Plan Note (Addendum)
Well-controlled.  No edema.

## 2017-07-27 NOTE — Progress Notes (Signed)
  Tommi Rumps, MD Phone: [phone]  Gwendolyn King is a 76 y.o. female who presents today for follow-up.  Hypothyroidism: No skin changes.  No weight changes.  No heat or cold intolerance.  Taking her medication.  Recently diagnosed and treated for breast cancer.  She underwent lumpectomy and is in the middle of radiation.  She notes this is going fairly well.  She wonders about genetic screening for her breast cancer.  No family history of breast cancer or ovarian cancer.  She reports intermittent lower extremity edema has been going on for some time now.  She takes Lasix for this intermittently.  She notes no heart failure symptoms.  No shortness of breath.  No orthopnea.  No PND.  Social History   Tobacco Use  Smoking Status Never Smoker  Smokeless Tobacco Never Used     ROS see history of present illness  Objective  Physical Exam Vitals:   07/27/17 1128  BP: 132/62  Pulse: (!) 59  Temp: (!) 97.5 F (36.4 C)  SpO2: 97%    BP Readings from Last 3 Encounters:  07/27/17 132/62  07/02/17 (!) 131/49  06/26/17 (!) 144/62   Wt Readings from Last 3 Encounters:  07/27/17 149 lb 12.8 oz (67.9 kg)  07/02/17 152 lb 0.1 oz (68.9 kg)  06/26/17 150 lb (68 kg)    Physical Exam  Constitutional: No distress.  Neck: No thyromegaly present.  Cardiovascular: Normal rate, regular rhythm and normal heart sounds.  Pulmonary/Chest: Effort normal and breath sounds normal.  Musculoskeletal: She exhibits no edema.  Neurological: She is alert. Gait normal.  Skin: Skin is warm and dry. She is not diaphoretic.     Assessment/Plan: Please see individual problem list.  Ductal carcinoma in situ (DCIS) of right breast Currently undergoing radiation therapy.  Status post lumpectomy.  I discussed without a family history of breast or ovarian cancer genetic testing would be less likely to provide any benefit.  Offered referral to genetic counselor though this was deferred by the  patient.  Hyperlipidemia Check lipid panel.  Hypothyroidism TSH.  Continue current medication.  Hypertension Well-controlled.  No edema.   [name] was seen today for establish care.  Diagnoses and all orders for this visit:  Hypothyroidism, unspecified type -     TSH; Future  Lipid screening -     Lipid panel; Future  Ductal carcinoma in situ (DCIS) of right breast  Mixed hyperlipidemia  Essential hypertension    Orders Placed This Encounter  Procedures  . TSH    Standing Status:   Future    Standing Expiration Date:   07/27/2018  . Lipid panel    Standing Status:   Future    Standing Expiration Date:   07/27/2018    No orders of the defined types were placed in this encounter.    Tommi Rumps, MD [place]

## 2017-07-27 NOTE — Assessment & Plan Note (Signed)
TSH.  Continue current medication.

## 2017-07-27 NOTE — Assessment & Plan Note (Signed)
Check lipid panel  

## 2017-07-30 ENCOUNTER — Ambulatory Visit: Payer: PPO

## 2017-07-31 ENCOUNTER — Ambulatory Visit
Admission: RE | Admit: 2017-07-31 | Discharge: 2017-07-31 | Disposition: A | Discharge status: Home / Self Care | Payer: PPO | Source: Ambulatory Visit | Attending: Radiation Oncology | Admitting: Radiation Oncology

## 2017-07-31 DIAGNOSIS — Z510 Encounter for antineoplastic radiation therapy: Secondary | ICD-10-CM | POA: Diagnosis not present

## 2017-07-31 DIAGNOSIS — D0511 Intraductal carcinoma in situ of right breast: Secondary | ICD-10-CM | POA: Diagnosis not present

## 2017-07-31 DIAGNOSIS — Z170 Estrogen receptor positive status [ER+]: Secondary | ICD-10-CM | POA: Diagnosis not present

## 2017-08-01 ENCOUNTER — Inpatient Hospital Stay: Payer: PPO | Attending: Radiation Oncology

## 2017-08-01 ENCOUNTER — Other Ambulatory Visit (INDEPENDENT_AMBULATORY_CARE_PROVIDER_SITE_OTHER): Payer: PPO

## 2017-08-01 ENCOUNTER — Ambulatory Visit: Payer: PPO

## 2017-08-01 DIAGNOSIS — E039 Hypothyroidism, unspecified: Secondary | ICD-10-CM | POA: Diagnosis not present

## 2017-08-01 DIAGNOSIS — C50911 Malignant neoplasm of unspecified site of right female breast: Secondary | ICD-10-CM | POA: Diagnosis present

## 2017-08-01 DIAGNOSIS — D0511 Intraductal carcinoma in situ of right breast: Secondary | ICD-10-CM

## 2017-08-01 DIAGNOSIS — Z13220 Encounter for screening for lipoid disorders: Secondary | ICD-10-CM

## 2017-08-01 LAB — CBC
HCT: 43.1 % (ref 35.0–47.0)
Hemoglobin: 14.4 g/dL (ref 12.0–16.0)
MCH: 29.2 pg (ref 26.0–34.0)
MCHC: 33.3 g/dL (ref 32.0–36.0)
MCV: 87.9 fL (ref 80.0–100.0)
Platelets: 106 10*3/uL — ABNORMAL LOW (ref 150–440)
RBC: 4.91 MIL/uL (ref 3.80–5.20)
RDW: 12.8 % (ref 11.5–14.5)
WBC: 4.7 10*3/uL (ref 3.6–11.0)

## 2017-08-01 LAB — TSH: TSH: 1.4 u[IU]/mL (ref 0.35–4.50)

## 2017-08-01 LAB — LIPID PANEL
Cholesterol: 197 mg/dL (ref 0–200)
HDL: 48.7 mg/dL
LDL Cholesterol: 120 mg/dL — ABNORMAL HIGH (ref 0–99)
NonHDL: 148.16
Total CHOL/HDL Ratio: 4
Triglycerides: 143 mg/dL (ref 0.0–149.0)
VLDL: 28.6 mg/dL (ref 0.0–40.0)

## 2017-08-01 NOTE — Addendum Note (Signed)
Addended by: Leeanne Rio on: 08/01/2017 01:11 PM   Modules accepted: Orders

## 2017-08-02 ENCOUNTER — Ambulatory Visit
Admission: RE | Admit: 2017-08-02 | Discharge: 2017-08-02 | Disposition: A | Discharge status: Home / Self Care | Payer: PPO | Source: Ambulatory Visit | Attending: Radiation Oncology | Admitting: Radiation Oncology

## 2017-08-02 DIAGNOSIS — Z510 Encounter for antineoplastic radiation therapy: Secondary | ICD-10-CM | POA: Diagnosis not present

## 2017-08-03 ENCOUNTER — Ambulatory Visit
Admission: RE | Admit: 2017-08-03 | Discharge: 2017-08-03 | Disposition: A | Discharge status: Home / Self Care | Payer: PPO | Source: Ambulatory Visit | Attending: Radiation Oncology | Admitting: Radiation Oncology

## 2017-08-03 DIAGNOSIS — Z170 Estrogen receptor positive status [ER+]: Secondary | ICD-10-CM | POA: Diagnosis not present

## 2017-08-03 DIAGNOSIS — Z510 Encounter for antineoplastic radiation therapy: Secondary | ICD-10-CM | POA: Diagnosis not present

## 2017-08-03 DIAGNOSIS — D0511 Intraductal carcinoma in situ of right breast: Secondary | ICD-10-CM | POA: Diagnosis not present

## 2017-08-06 ENCOUNTER — Ambulatory Visit
Admission: RE | Admit: 2017-08-06 | Discharge: 2017-08-06 | Disposition: A | Discharge status: Home / Self Care | Payer: PPO | Source: Ambulatory Visit | Attending: Radiation Oncology | Admitting: Radiation Oncology

## 2017-08-06 DIAGNOSIS — Z170 Estrogen receptor positive status [ER+]: Secondary | ICD-10-CM | POA: Diagnosis not present

## 2017-08-06 DIAGNOSIS — Z510 Encounter for antineoplastic radiation therapy: Secondary | ICD-10-CM | POA: Diagnosis not present

## 2017-08-06 DIAGNOSIS — D0511 Intraductal carcinoma in situ of right breast: Secondary | ICD-10-CM | POA: Diagnosis not present

## 2017-08-07 ENCOUNTER — Ambulatory Visit
Admission: RE | Admit: 2017-08-07 | Discharge: 2017-08-07 | Disposition: A | Discharge status: Home / Self Care | Payer: PPO | Source: Ambulatory Visit | Attending: Radiation Oncology | Admitting: Radiation Oncology

## 2017-08-07 DIAGNOSIS — Z510 Encounter for antineoplastic radiation therapy: Secondary | ICD-10-CM | POA: Diagnosis not present

## 2017-08-07 DIAGNOSIS — D0511 Intraductal carcinoma in situ of right breast: Secondary | ICD-10-CM | POA: Diagnosis not present

## 2017-08-07 DIAGNOSIS — Z170 Estrogen receptor positive status [ER+]: Secondary | ICD-10-CM | POA: Diagnosis not present

## 2017-08-08 ENCOUNTER — Ambulatory Visit
Admission: RE | Admit: 2017-08-08 | Discharge: 2017-08-08 | Disposition: A | Discharge status: Home / Self Care | Payer: PPO | Source: Ambulatory Visit | Attending: Radiation Oncology | Admitting: Radiation Oncology

## 2017-08-08 ENCOUNTER — Ambulatory Visit: Payer: PPO

## 2017-08-08 DIAGNOSIS — Z170 Estrogen receptor positive status [ER+]: Secondary | ICD-10-CM | POA: Diagnosis not present

## 2017-08-08 DIAGNOSIS — Z510 Encounter for antineoplastic radiation therapy: Secondary | ICD-10-CM | POA: Diagnosis not present

## 2017-08-08 DIAGNOSIS — D0511 Intraductal carcinoma in situ of right breast: Secondary | ICD-10-CM | POA: Diagnosis not present

## 2017-08-09 ENCOUNTER — Ambulatory Visit: Payer: PPO

## 2017-08-09 ENCOUNTER — Ambulatory Visit
Admission: RE | Admit: 2017-08-09 | Discharge: 2017-08-09 | Disposition: A | Discharge status: Home / Self Care | Payer: PPO | Source: Ambulatory Visit | Attending: Radiation Oncology | Admitting: Radiation Oncology

## 2017-08-09 DIAGNOSIS — Z170 Estrogen receptor positive status [ER+]: Secondary | ICD-10-CM | POA: Diagnosis not present

## 2017-08-09 DIAGNOSIS — D0511 Intraductal carcinoma in situ of right breast: Secondary | ICD-10-CM | POA: Diagnosis not present

## 2017-08-09 DIAGNOSIS — Z510 Encounter for antineoplastic radiation therapy: Secondary | ICD-10-CM | POA: Diagnosis not present

## 2017-08-10 ENCOUNTER — Ambulatory Visit: Payer: PPO

## 2017-08-10 DIAGNOSIS — Z510 Encounter for antineoplastic radiation therapy: Secondary | ICD-10-CM | POA: Diagnosis not present

## 2017-08-13 ENCOUNTER — Ambulatory Visit
Admission: RE | Admit: 2017-08-13 | Discharge: 2017-08-13 | Disposition: A | Discharge status: Home / Self Care | Payer: PPO | Source: Ambulatory Visit | Attending: Radiation Oncology | Admitting: Radiation Oncology

## 2017-08-13 DIAGNOSIS — Z510 Encounter for antineoplastic radiation therapy: Secondary | ICD-10-CM | POA: Diagnosis not present

## 2017-08-15 ENCOUNTER — Inpatient Hospital Stay: Payer: PPO

## 2017-08-15 ENCOUNTER — Ambulatory Visit
Admission: RE | Admit: 2017-08-15 | Discharge: 2017-08-15 | Disposition: A | Discharge status: Home / Self Care | Payer: PPO | Source: Ambulatory Visit | Attending: Radiation Oncology | Admitting: Radiation Oncology

## 2017-08-15 DIAGNOSIS — D0511 Intraductal carcinoma in situ of right breast: Secondary | ICD-10-CM

## 2017-08-15 DIAGNOSIS — C50911 Malignant neoplasm of unspecified site of right female breast: Secondary | ICD-10-CM | POA: Diagnosis not present

## 2017-08-15 DIAGNOSIS — Z510 Encounter for antineoplastic radiation therapy: Secondary | ICD-10-CM | POA: Diagnosis not present

## 2017-08-15 LAB — CBC
HCT: 41.9 % (ref 35.0–47.0)
Hemoglobin: 14.2 g/dL (ref 12.0–16.0)
MCH: 29.6 pg (ref 26.0–34.0)
MCHC: 34 g/dL (ref 32.0–36.0)
MCV: 87 fL (ref 80.0–100.0)
Platelets: 94 10*3/uL — ABNORMAL LOW (ref 150–440)
RBC: 4.82 MIL/uL (ref 3.80–5.20)
RDW: 13 % (ref 11.5–14.5)
WBC: 4.8 10*3/uL (ref 3.6–11.0)

## 2017-08-16 ENCOUNTER — Ambulatory Visit: Payer: PPO

## 2017-08-16 DIAGNOSIS — D0511 Intraductal carcinoma in situ of right breast: Secondary | ICD-10-CM | POA: Diagnosis not present

## 2017-08-16 DIAGNOSIS — Z170 Estrogen receptor positive status [ER+]: Secondary | ICD-10-CM | POA: Diagnosis not present

## 2017-08-16 DIAGNOSIS — Z510 Encounter for antineoplastic radiation therapy: Secondary | ICD-10-CM | POA: Diagnosis not present

## 2017-08-17 ENCOUNTER — Ambulatory Visit
Admission: RE | Admit: 2017-08-17 | Discharge: 2017-08-17 | Disposition: A | Discharge status: Home / Self Care | Payer: PPO | Source: Ambulatory Visit | Attending: Radiation Oncology | Admitting: Radiation Oncology

## 2017-08-17 DIAGNOSIS — Z510 Encounter for antineoplastic radiation therapy: Secondary | ICD-10-CM | POA: Diagnosis not present

## 2017-08-20 ENCOUNTER — Ambulatory Visit: Payer: PPO

## 2017-08-20 ENCOUNTER — Ambulatory Visit
Admission: RE | Admit: 2017-08-20 | Discharge: 2017-08-20 | Disposition: A | Discharge status: Home / Self Care | Payer: PPO | Source: Ambulatory Visit | Attending: Radiation Oncology | Admitting: Radiation Oncology

## 2017-08-20 DIAGNOSIS — Z510 Encounter for antineoplastic radiation therapy: Secondary | ICD-10-CM | POA: Diagnosis not present

## 2017-08-22 ENCOUNTER — Ambulatory Visit: Payer: PPO

## 2017-08-22 ENCOUNTER — Ambulatory Visit
Admission: RE | Admit: 2017-08-22 | Discharge: 2017-08-22 | Disposition: A | Discharge status: Home / Self Care | Payer: PPO | Source: Ambulatory Visit | Attending: Radiation Oncology | Admitting: Radiation Oncology

## 2017-08-22 DIAGNOSIS — Z96651 Presence of right artificial knee joint: Secondary | ICD-10-CM | POA: Diagnosis not present

## 2017-08-22 DIAGNOSIS — Z510 Encounter for antineoplastic radiation therapy: Secondary | ICD-10-CM | POA: Diagnosis not present

## 2017-08-22 NOTE — Progress Notes (Signed)
[place]  [phone] [phone]) [phone]  ID: Gwendolyn King OB: 12-28-1940  MR#: [id number]  INO#:[id number]  Patient Care Team: Leone Haven, MD as PCP - General (Family Medicine) Rockey Situ Kathlene November, MD as Consulting Physician (Cardiology) Bary Castilla, Forest Gleason, MD (General Surgery)  CHIEF COMPLAINT: DCIS, right breast.  INTERVAL HISTORY: Patient is a 77 year old female who recently completed a lumpectomy and XRT for right-sided DCIS.  Patient was never referred to medical oncology, therefore made a self-referral for further evaluation.  She currently feels well and is asymptomatic.  She tolerated her XRT well without significant side effects.  She has good appetite and denies weight loss.  She has no neurologic complaints.  She has no chest pain or shortness of breath.  She denies any nausea, vomiting, constipation, or diarrhea.  She has no urinary complaints.  Patient feels at her baseline offers no specific complaints today.  REVIEW OF SYSTEMS:   Review of Systems  Constitutional: Negative.  Negative for fever, malaise/fatigue and weight loss.  Respiratory: Negative.  Negative for cough and shortness of breath.   Cardiovascular: Negative.  Negative for chest pain and leg swelling.  Gastrointestinal: Negative.  Negative for abdominal pain.  Genitourinary: Negative.   Musculoskeletal: Negative.   Skin: Negative.  Negative for rash.  Neurological: Negative.  Negative for sensory change and weakness.  Psychiatric/Behavioral: Negative.  The patient is not nervous/anxious.     As per HPI. Otherwise, a complete review of systems is negative.  PAST MEDICAL HISTORY: Past Medical History:  Diagnosis Date  . Allergy    hay fever, fall allergies  . Arthritis   . Asthma    as a child, dad was smoker  . 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  . Chicken pox   .  Colon polyp   . Constipation   . Hyperlipidemia   . Hypothyroidism   . Palpitations   . Thyroid disease     PAST SURGICAL HISTORY: Past Surgical History:  Procedure Laterality Date  . ABDOMINAL HYSTERECTOMY  1981  . APPENDECTOMY  2012  . 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 BREAST LUMPECTOMY Right 06/07/2017   DUCTAL CARCINOMA IN SITU (DCIS)  . BREAST LUMPECTOMY Right 06/07/2017   PAPILLARY LESION CONSISTENT WITH PAPILLARY CARCINOMA  . BREAST LUMPECTOMY WITH SENTINEL LYMPH NODE BIOPSY Right 06/07/2017   Wide excision of 2 foci of high-grade DCIS, microinvasion noted only on core biopsy. 2 microscopic foci lateral inferior margins. Patient elected not to proceed to reexcision.  . COLONOSCOPY    . COLONOSCOPY W/ POLYPECTOMY    . FINGER FRACTURE SURGERY Left    5th  . JOINT REPLACEMENT     right knee  . ROTATOR CUFF REPAIR Right   . TONSILLECTOMY AND ADENOIDECTOMY  1963  . TOTAL KNEE ARTHROPLASTY Right 10/17/2016   Procedure: TOTAL KNEE ARTHROPLASTY;  Surgeon: Melrose Nakayama, MD;  Location: [facility];  Service: Orthopedics;  Laterality: Right;  Marland Kitchen VAGINAL DELIVERY     3    FAMILY HISTORY: Family History  Problem Relation Age of Onset  . Arthritis Mother   . Heart disease Mother   . Hypertension Mother   . Arthritis Father   . Heart disease Father   . Hypertension Father   . Stroke Maternal Grandmother   . Cancer Maternal Aunt  abdominal?  . Hyperlipidemia Maternal Aunt   . Diabetes Paternal Aunt   . Hyperlipidemia Other   . Diabetes Other   . Breast cancer Neg Hx     ADVANCED DIRECTIVES (Y/N):  N  HEALTH MAINTENANCE: Social History   Tobacco Use  . Smoking status: Never Smoker  . Smokeless tobacco: Never Used  Substance Use Topics  . Alcohol use: No  . Drug use: No     Colonoscopy:  PAP:  Bone density:  Lipid panel:  Allergies  Allergen Reactions  . Lipitor  [Atorvastatin] Other (See Comments)    Myalgia, arthralgia  . Simvastatin Other (See Comments)    myalgia    Current Outpatient Medications  Medication Sig Dispense Refill  . acebutolol (SECTRAL) 200 MG capsule Take 1 capsule (200 mg total) by mouth daily. 90 capsule 3  . acetaminophen (TYLENOL) 500 MG tablet Take 1,000 mg by mouth at bedtime.    Marland Kitchen aspirin EC 81 MG tablet Take 81 mg by mouth daily.    . Biotin (BIOTIN 5000) 5 MG CAPS Take 5 mg by mouth daily.     . Cholecalciferol (VITAMIN D-3) 1000 UNITS CAPS Take 1,000 Units by mouth every evening.     . fexofenadine (ALLEGRA) 180 MG tablet Take 180 mg by mouth every evening.     . furosemide (LASIX) 20 MG tablet Take 1 tablet (20 mg total) by mouth daily. (Patient taking differently: Take 20 mg by mouth daily as needed for fluid or edema. ) 90 tablet 3  . KRILL OIL PO Take 1 capsule by mouth daily.     Marland Kitchen LEVOXYL 75 MCG tablet TAKE 1 TABLET ONE TIME DAILY 90 tablet 3  . polyethylene glycol (MIRALAX / GLYCOLAX) packet Take 17 g by mouth daily.    . tamoxifen (NOLVADEX) 20 MG tablet Take 1 tablet (20 mg total) by mouth daily. 90 tablet 3   No current facility-administered medications for this visit.     OBJECTIVE: Vitals:   08/23/17 1348  BP: (!) 143/69  Pulse: 65  Resp: 18  Temp: (!) 95.9 F (35.5 C)     Body mass index is 28.64 kg/m.    ECOG FS:0 - Asymptomatic  General: Well-developed, well-nourished, no acute distress. Eyes: Pink conjunctiva, anicteric sclera. HEENT: Normocephalic, moist mucous membranes, clear oropharnyx.   Breast: Patient requested exam be deferred today. Lungs: Clear to auscultation bilaterally. Heart: Regular rate and rhythm. No rubs, murmurs, or gallops. Abdomen: Soft, nontender, nondistended. No organomegaly noted, normoactive bowel sounds. Musculoskeletal: No edema, cyanosis, or clubbing. Neuro: Alert, answering all questions appropriately. Cranial nerves grossly intact. Skin: No rashes or  petechiae noted. Psych: Normal affect. Lymphatics: No cervical, calvicular, axillary or inguinal LAD.   LAB RESULTS:  Lab Results  Component Value Date   NA 141 06/01/2017   K 3.9 06/01/2017   CL 105 06/01/2017   CO2 27 06/01/2017   GLUCOSE 85 06/01/2017   BUN 26 (H) 06/01/2017   CREATININE 1.23 (H) 06/01/2017   CALCIUM 10.0 06/01/2017   PROT 6.9 06/01/2017   ALBUMIN 4.4 06/01/2017   AST 20 06/01/2017   ALT 16 06/01/2017   ALKPHOS 52 06/01/2017   BILITOT 1.2 06/01/2017   GFRNONAA 42 (L) 06/01/2017   GFRAA 49 (L) 06/01/2017    Lab Results  Component Value Date   WBC 4.8 08/15/2017   NEUTROABS 2.6 06/01/2017   HGB 14.2 08/15/2017   HCT 41.9 08/15/2017   MCV 87.0 08/15/2017   PLT 94 (L)  08/15/2017     STUDIES: No results found.  ASSESSMENT: DCIS, right breast  PLAN:    1. DCIS, right breast: Patient underwent lumpectomy on June 07, 2017.  Final pathology was reviewed and no invasive component was noted, there patient did not require chemotherapy.  She completed adjuvant XRT earlier today.  Patient will require 5 years of tamoxifen completing in January 2024.  Return to clinic in 3 months for further evaluation at which time a breast exam can be performed.  If patient is tolerating tamoxifen well, she can likely be evaluated every 6 months. 2.  Thrombocytopenia: Mild, unclear etiology.  Will consider full workup in the future if necessary.  Approximately 60 minutes was spent in discussion of which greater than 50% was consultation.  Patient expressed understanding and was in agreement with this plan. She also understands that She can call clinic at any time with any questions, concerns, or complaints.   Cancer Staging Ductal carcinoma in situ (DCIS) of right breast Staging form: Breast, AJCC 8th Edition - Clinical stage from 08/22/2017: Stage 0 (cTis (DCIS), cN0, cM0, ER: Positive, PR: Negative, HER2: Negative) - Signed by Lloyd Huger, MD on  08/22/2017   Lloyd Huger, MD   08/26/2017 8:41 AM

## 2017-08-23 ENCOUNTER — Inpatient Hospital Stay: Payer: PPO | Attending: Oncology | Admitting: Oncology

## 2017-08-23 ENCOUNTER — Encounter: Payer: Self-pay | Admitting: *Deleted

## 2017-08-23 ENCOUNTER — Encounter: Payer: Self-pay | Admitting: Oncology

## 2017-08-23 ENCOUNTER — Ambulatory Visit
Admission: RE | Admit: 2017-08-23 | Discharge: 2017-08-23 | Disposition: A | Discharge status: Home / Self Care | Payer: PPO | Source: Ambulatory Visit | Attending: Radiation Oncology | Admitting: Radiation Oncology

## 2017-08-23 VITALS — BP 143/69 | HR 65 | Temp 95.9°F | Resp 18 | Wt 151.6 lb

## 2017-08-23 DIAGNOSIS — E039 Hypothyroidism, unspecified: Secondary | ICD-10-CM | POA: Insufficient documentation

## 2017-08-23 DIAGNOSIS — Z79810 Long term (current) use of selective estrogen receptor modulators (SERMs): Secondary | ICD-10-CM | POA: Diagnosis not present

## 2017-08-23 DIAGNOSIS — E785 Hyperlipidemia, unspecified: Secondary | ICD-10-CM | POA: Insufficient documentation

## 2017-08-23 DIAGNOSIS — Z79899 Other long term (current) drug therapy: Secondary | ICD-10-CM | POA: Insufficient documentation

## 2017-08-23 DIAGNOSIS — Z170 Estrogen receptor positive status [ER+]: Secondary | ICD-10-CM | POA: Insufficient documentation

## 2017-08-23 DIAGNOSIS — Z7982 Long term (current) use of aspirin: Secondary | ICD-10-CM | POA: Insufficient documentation

## 2017-08-23 DIAGNOSIS — Z923 Personal history of irradiation: Secondary | ICD-10-CM | POA: Diagnosis not present

## 2017-08-23 DIAGNOSIS — Z90710 Acquired absence of both cervix and uterus: Secondary | ICD-10-CM | POA: Insufficient documentation

## 2017-08-23 DIAGNOSIS — D0511 Intraductal carcinoma in situ of right breast: Secondary | ICD-10-CM | POA: Diagnosis not present

## 2017-08-23 DIAGNOSIS — D696 Thrombocytopenia, unspecified: Secondary | ICD-10-CM | POA: Diagnosis not present

## 2017-08-23 MED ORDER — TAMOXIFEN CITRATE 20 MG PO TABS: 20.0000 mg | ORAL_TABLET | Freq: Every day | ORAL | 3 refills | Status: DC

## 2017-08-23 NOTE — Progress Notes (Signed)
Patient is here for new patient evaluation. She just completed her last radiation treatment today.she is doing good no complaints.

## 2017-08-23 NOTE — Progress Notes (Signed)
  Oncology Nurse Navigator Documentation  Navigator Location: [facility] (08/23/17 1700) Referral date to RadOnc/MedOnc: 08/23/17 (08/23/17 1700) )Navigator Encounter Type: Initial MedOnc (08/23/17 1700)                                                    Time Spent with Patient: 30 (08/23/17 1700)   Met patient and her husband during her initial medical oncology consult with Dr. Grayland Ormond.  Patient has had her lumpectomy and completed radiation therapy.  She is to be treated with antihormonal therapy.  She is to call if she has any questions or needs.

## 2017-08-24 DIAGNOSIS — Z170 Estrogen receptor positive status [ER+]: Secondary | ICD-10-CM | POA: Diagnosis not present

## 2017-08-24 DIAGNOSIS — Z510 Encounter for antineoplastic radiation therapy: Secondary | ICD-10-CM | POA: Diagnosis not present

## 2017-08-24 DIAGNOSIS — D0511 Intraductal carcinoma in situ of right breast: Secondary | ICD-10-CM | POA: Diagnosis not present

## 2017-09-18 ENCOUNTER — Telehealth: Payer: Self-pay

## 2017-09-18 NOTE — Telephone Encounter (Signed)
Left message to return call to ask if they need a surgical clearance or just the last office visit note and labs. Gwendolyn King for pec to speak to surgical center

## 2017-09-18 NOTE — Telephone Encounter (Signed)
I tried calling Surgical Center went to RN voicemail, wasn't sure if Dr Caryl Bis had filled out surgical clearance for patient . Thanks

## 2017-09-18 NOTE — Telephone Encounter (Signed)
Copied from [place] [id number]. Topic: General - Other >> Sep 18, 2017  2:51 PM Vernona Rieger wrote: [facility] said they need the last OV notes and any lab work the patient may have had so they can get her cleared for surgery on Thursday. Please advise.    Call back is [phone] ext [phone] Fax [phone]

## 2017-09-19 NOTE — Telephone Encounter (Signed)
Noted  

## 2017-09-19 NOTE — Telephone Encounter (Signed)
Copied from [place] [id number]. Topic: Quick Communication - See Telephone Encounter >> Sep 18, 2017  4:16 PM Juanda Chance, CMA wrote: CRM for notification. See Telephone encounter for:  Left message to return call to ask if they need a surgical clearance or just the last office visit note and labs. [place] for pec to speak to surgical center 09/18/17.

## 2017-09-19 NOTE — Telephone Encounter (Signed)
Faxed, per surgical center they only needed last labs and last OV note

## 2017-09-20 DIAGNOSIS — G8918 Other acute postprocedural pain: Secondary | ICD-10-CM | POA: Diagnosis not present

## 2017-09-20 DIAGNOSIS — M2041 Other hammer toe(s) (acquired), right foot: Secondary | ICD-10-CM | POA: Diagnosis not present

## 2017-09-20 HISTORY — PX: HAMMER TOE SURGERY: SHX385

## 2017-09-26 ENCOUNTER — Encounter: Payer: Self-pay | Admitting: Family Medicine

## 2017-09-26 ENCOUNTER — Ambulatory Visit (INDEPENDENT_AMBULATORY_CARE_PROVIDER_SITE_OTHER): Payer: PPO | Admitting: General Surgery

## 2017-09-26 ENCOUNTER — Encounter: Payer: Self-pay | Admitting: General Surgery

## 2017-09-26 VITALS — BP 132/80 | HR 59 | Resp 12 | Ht 61.0 in | Wt 151.0 lb

## 2017-09-26 DIAGNOSIS — D0511 Intraductal carcinoma in situ of right breast: Secondary | ICD-10-CM | POA: Diagnosis not present

## 2017-09-26 NOTE — Patient Instructions (Addendum)
The patient is aware to call back for any questions or concerns. Patient to have a bilateral diagnostic mammogram follow up in 8 months. Avoid chest pressure activities in the gym.

## 2017-09-26 NOTE — Progress Notes (Signed)
Patient ID: Gwendolyn King, female   DOB: 1940-09-25, 77 y.o.   MRN: [id number]  Chief Complaint  Patient presents with  . Follow-up    HPI Gwendolyn King is a 77 y.o. female.  Here for follow up right breast cancer. No new breast issues, still tender. She completed radiation Jan 3. She had hammer toe surgery last week and is doing well. She is here with with her husband, Gwendolyn King.  HPI  Past Medical History:  Diagnosis Date  . Allergy    hay fever, fall allergies  . Arthritis   . Asthma    as a child, dad was smoker  . 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  . Chicken pox   . Colon polyp   . Constipation   . Hyperlipidemia   . Hypothyroidism   . Palpitations   . Thyroid disease     Past Surgical History:  Procedure Laterality Date  . ABDOMINAL HYSTERECTOMY  1981  . APPENDECTOMY  2012  . 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 BREAST LUMPECTOMY Right 06/07/2017   DUCTAL CARCINOMA IN SITU (DCIS)  . BREAST LUMPECTOMY Right 06/07/2017   PAPILLARY LESION CONSISTENT WITH PAPILLARY CARCINOMA  . BREAST LUMPECTOMY WITH SENTINEL LYMPH NODE BIOPSY Right 06/07/2017   Wide excision of 2 foci of high-grade DCIS, microinvasion noted only on core biopsy. 2 microscopic foci lateral inferior margins. Patient elected not to proceed to reexcision.  . COLONOSCOPY    . COLONOSCOPY W/ POLYPECTOMY    . FINGER FRACTURE SURGERY Left    5th  . HAMMER TOE SURGERY  09/20/2017  . JOINT REPLACEMENT     right knee  . ROTATOR CUFF REPAIR Right   . TONSILLECTOMY AND ADENOIDECTOMY  1963  . TOTAL KNEE ARTHROPLASTY Right 10/17/2016   Procedure: TOTAL KNEE ARTHROPLASTY;  Surgeon: Melrose Nakayama, MD;  Location: [facility];  Service: Orthopedics;  Laterality: Right;  Marland Kitchen VAGINAL DELIVERY     3    Family History  Problem Relation Age of  Onset  . Arthritis Mother   . Heart disease Mother   . Hypertension Mother   . Arthritis Father   . Heart disease Father   . Hypertension Father   . Stroke Maternal Grandmother   . Cancer Maternal Aunt        abdominal?  . Hyperlipidemia Maternal Aunt   . Diabetes Paternal Aunt   . Hyperlipidemia Other   . Diabetes Other   . Breast cancer Neg Hx     Social History Social History   Tobacco Use  . Smoking status: Never Smoker  . Smokeless tobacco: Never Used  Substance Use Topics  . Alcohol use: No  . Drug use: No    Allergies  Allergen Reactions  . Lipitor [Atorvastatin] Other (See Comments)    Myalgia, arthralgia  . Simvastatin Other (See Comments)    myalgia    Current Outpatient Medications  Medication Sig Dispense Refill  . acebutolol (SECTRAL) 200 MG capsule Take 1 capsule (200 mg total) by mouth daily. 90 capsule 3  . acetaminophen (TYLENOL) 500 MG tablet Take 1,000 mg by mouth at bedtime.    Marland Kitchen aspirin EC 81 MG tablet Take 81 mg by mouth daily.    . Biotin (BIOTIN 5000) 5 MG CAPS Take 5 mg by mouth daily.     Marland Kitchen  Cholecalciferol (VITAMIN D-3) 1000 UNITS CAPS Take 1,000 Units by mouth every evening.     . fexofenadine (ALLEGRA) 180 MG tablet Take 180 mg by mouth every evening.     . furosemide (LASIX) 20 MG tablet Take 1 tablet (20 mg total) by mouth daily. (Patient taking differently: Take 20 mg by mouth daily as needed for fluid or edema. ) 90 tablet 3  . KRILL OIL PO Take 1 capsule by mouth daily.     Marland Kitchen LEVOXYL 75 MCG tablet TAKE 1 TABLET ONE TIME DAILY 90 tablet 3  . polyethylene glycol (MIRALAX / GLYCOLAX) packet Take 17 g by mouth daily.    . tamoxifen (NOLVADEX) 20 MG tablet Take 1 tablet (20 mg total) by mouth daily. 90 tablet 3   No current facility-administered medications for this visit.     Review of Systems Review of Systems  Constitutional: Negative.   Respiratory: Negative.   Cardiovascular: Negative.     Blood pressure 132/80, pulse (!)  59, resp. rate 12, height '5\' 1"'$  (1.549 m), weight 151 lb (68.5 kg).  Physical Exam Physical Exam  Constitutional: She is oriented to person, place, and time. She appears well-developed and well-nourished.  Pulmonary/Chest: Right breast exhibits no inverted nipple, no mass, no nipple discharge, no skin change and no tenderness.    incision well healed right breast  Neurological: She is alert and oriented to person, place, and time.  Skin: Skin is warm and dry.  Psychiatric: Her behavior is normal.    Data Reviewed Whole breast radiation notes reviewed.  Tamoxifen therapy instituted by medical oncology.  May 17, 2016 bone density showed evidence of osteoporosis.  Assessment    Doing well post wide excision and whole breast radiation.  Plan    Patient to have a bilateral diagnostic mammogram follow up in 8 months.  We will arrange for bone density test in fall 2019.   Avoid chest pressure activities in the gym.     HPI, Physical Exam, Assessment and Plan have been scribed under the direction and in the presence of Robert Bellow, MD. Karie Fetch, RN  I have completed the exam and reviewed the above documentation for accuracy and completeness.  I agree with the above.  Haematologist has been used and any errors in dictation or transcription are unintentional.  Hervey Ard, M.D., F.A.C.S.  Gwendolyn King 09/26/2017, 8:43 PM

## 2017-09-27 ENCOUNTER — Other Ambulatory Visit: Payer: Self-pay

## 2017-09-27 ENCOUNTER — Ambulatory Visit
Admission: RE | Admit: 2017-09-27 | Discharge: 2017-09-27 | Disposition: A | Discharge status: Home / Self Care | Payer: PPO | Source: Ambulatory Visit | Attending: Radiation Oncology | Admitting: Radiation Oncology

## 2017-09-27 ENCOUNTER — Encounter: Payer: Self-pay | Admitting: Radiation Oncology

## 2017-09-27 VITALS — BP 138/58 | Temp 97.8°F | Resp 18 | Wt 152.8 lb

## 2017-09-27 DIAGNOSIS — D0511 Intraductal carcinoma in situ of right breast: Secondary | ICD-10-CM | POA: Insufficient documentation

## 2017-09-27 DIAGNOSIS — Z170 Estrogen receptor positive status [ER+]: Secondary | ICD-10-CM | POA: Diagnosis not present

## 2017-09-27 DIAGNOSIS — Z923 Personal history of irradiation: Secondary | ICD-10-CM | POA: Diagnosis not present

## 2017-09-27 NOTE — Progress Notes (Signed)
  Radiation Oncology Follow up Note  Name: Gwendolyn King   Date:   09/27/2017 MRN:  [id number] DOB: Jul 22, 1941    This 77 y.o. 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 Tumor was ER positive PR negative. She is seen today in routine follow-up is doing well. She specifically denies breast tenderness cough or bone pain. She is currently on tamoxifen tolerating that well without side effect.  COMPLICATIONS OF TREATMENT: none  FOLLOW UP COMPLIANCE: keeps appointments   PHYSICAL EXAM:  BP (!) 138/58   Temp 97.8 F (36.6 C)   Resp 18   Wt 152 lb 12.5 oz (69.3 kg)   BMI 28.87 kg/m  Right breast has changes associated with radiation with some hyperpigmentation the skin. No dominant mass or nodularity is noted in either breast in 2 positions examined. No axillary or supraclavicular adenopathy is identified. Well-developed well-nourished patient in NAD. HEENT reveals PERLA, EOMI, discs not visualized.  Oral cavity is clear. No oral mucosal lesions are identified. Neck is clear without evidence of cervical or supraclavicular adenopathy. Lungs are clear to A&P. Cardiac examination is essentially unremarkable with regular rate and rhythm without murmur rub or thrill. Abdomen is benign with no organomegaly or masses noted. Motor sensory and DTR levels are equal and symmetric in the upper and lower extremities. Cranial nerves II through XII are grossly intact. Proprioception is intact. No peripheral adenopathy or edema is identified. No  motor or sensory levels are noted. Crude visual fields are within normal range.  RADIOLOGY RESULTS: No current films for review  PLAN: Present time patient is recovering nicely from her radiation therapy treatments. I'm please were overall progress. I've asked to see her back in 4-5 months for follow-up. She continues on tamoxifen without side effect. Patient knows to call with any concerns.  I would like to take this opportunity to thank you for allowing me to participate in the care of your patient.Noreene Filbert, MD

## 2017-10-01 DIAGNOSIS — M2041 Other hammer toe(s) (acquired), right foot: Secondary | ICD-10-CM | POA: Diagnosis not present

## 2017-10-19 DIAGNOSIS — Z9889 Other specified postprocedural states: Secondary | ICD-10-CM | POA: Diagnosis not present

## 2017-11-11 ENCOUNTER — Encounter: Payer: Self-pay | Admitting: Family Medicine

## 2017-11-12 ENCOUNTER — Encounter: Payer: Self-pay | Admitting: Gastroenterology

## 2017-11-12 ENCOUNTER — Encounter: Payer: Self-pay | Admitting: Family Medicine

## 2017-11-16 DIAGNOSIS — Z9889 Other specified postprocedural states: Secondary | ICD-10-CM | POA: Diagnosis not present

## 2017-11-25 NOTE — Progress Notes (Signed)
[place]  [phone] [phone]  ID: Gwendolyn King OB: 1940-09-05  MR#: [id number]  NOM#:[id number]  Patient Care Team: Leone Haven, MD as PCP - General (Family Medicine) Minna Merritts, MD as Consulting Physician (Cardiology) Bary Castilla, Forest Gleason, MD (General Surgery)  CHIEF COMPLAINT: DCIS, right breast.  INTERVAL HISTORY: Patient returns to clinic today for routine 3-monthevaluation.  She is tolerating tamoxifen well, although admits to occasional hot flashes. She has had a cough for 2 weeks, but otherwise feels well. She has a good appetite and denies weight loss.  She has no neurologic complaints.  She denies any chest pain or shortness of breath.  She denies any nausea, vomiting, constipation, or diarrhea.  She has no urinary complaints.  Patient offers no specific complaints today.  REVIEW OF SYSTEMS:   Review of Systems  Constitutional: Negative.  Negative for fever, malaise/fatigue and weight loss.  Respiratory: Positive for cough. Negative for shortness of breath.   Cardiovascular: Negative.  Negative for chest pain and leg swelling.  Gastrointestinal: Negative.  Negative for abdominal pain and constipation.  Genitourinary: Negative.  Negative for dysuria.  Musculoskeletal: Negative.   Skin: Negative.  Negative for rash.  Neurological: Positive for sensory change. Negative for focal weakness and weakness.  Psychiatric/Behavioral: Negative.  The patient is not nervous/anxious.     As per HPI. Otherwise, a complete review of systems is negative.  PAST MEDICAL HISTORY: Past Medical History:  Diagnosis Date  . Allergy    hay fever, fall allergies  . Arthritis   . Asthma    as a child, dad was smoker  . 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  . Chicken pox   . Colon polyp   . Constipation   . Hyperlipidemia   . Hypothyroidism     . Palpitations   . Thyroid disease     PAST SURGICAL HISTORY: Past Surgical History:  Procedure Laterality Date  . ABDOMINAL HYSTERECTOMY  1981  . APPENDECTOMY  2012  . 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 BREAST LUMPECTOMY Right 06/07/2017   DUCTAL CARCINOMA IN SITU (DCIS)  . BREAST LUMPECTOMY Right 06/07/2017   PAPILLARY LESION CONSISTENT WITH PAPILLARY CARCINOMA  . BREAST LUMPECTOMY WITH SENTINEL LYMPH NODE BIOPSY Right 06/07/2017   Wide excision of 2 foci of high-grade DCIS, microinvasion noted only on core biopsy. 2 microscopic foci lateral inferior margins. Patient elected not to proceed to reexcision.  . COLONOSCOPY    . COLONOSCOPY W/ POLYPECTOMY    . FINGER FRACTURE SURGERY Left    5th  . HAMMER TOE SURGERY  09/20/2017  . JOINT REPLACEMENT     right knee  . ROTATOR CUFF REPAIR Right   . TONSILLECTOMY AND ADENOIDECTOMY  1963  . TOTAL KNEE ARTHROPLASTY Right 10/17/2016   Procedure: TOTAL KNEE ARTHROPLASTY;  Surgeon: PMelrose Nakayama MD;  [facility]  Service: Orthopedics;  Laterality: Right;  .Marland KitchenVAGINAL DELIVERY     3    FAMILY HISTORY: Family History  Problem Relation Age of Onset  . Arthritis Mother   . Heart disease Mother   . Hypertension Mother   . Arthritis Father   . Heart disease Father   . Hypertension Father   . Stroke Maternal Grandmother   . Cancer Maternal Aunt        abdominal?  .  Hyperlipidemia Maternal Aunt   . Diabetes Paternal Aunt   . Hyperlipidemia Other   . Diabetes Other   . Breast cancer Neg Hx     ADVANCED DIRECTIVES (Y/N):  N  HEALTH MAINTENANCE: Social History   Tobacco Use  . Smoking status: Never Smoker  . Smokeless tobacco: Never Used  Substance Use Topics  . Alcohol use: No  . Drug use: No     Colonoscopy:  PAP:  Bone density:  Lipid panel:  Allergies  Allergen Reactions  . Lipitor [Atorvastatin] Other (See Comments)     Myalgia, arthralgia  . Simvastatin Other (See Comments)    myalgia    Current Outpatient Medications  Medication Sig Dispense Refill  . acebutolol (SECTRAL) 200 MG capsule Take 1 capsule (200 mg total) by mouth daily. 90 capsule 3  . acetaminophen (TYLENOL) 500 MG tablet Take 1,000 mg by mouth at bedtime.    Marland Kitchen aspirin EC 81 MG tablet Take 81 mg by mouth daily.    . Biotin (BIOTIN 5000) 5 MG CAPS Take 5 mg by mouth daily.     . Cholecalciferol (VITAMIN D-3) 1000 UNITS CAPS Take 1,000 Units by mouth every evening.     . fexofenadine (ALLEGRA) 180 MG tablet Take 180 mg by mouth every evening.     Marland Kitchen KRILL OIL PO Take 1 capsule by mouth daily.     Marland Kitchen LEVOXYL 75 MCG tablet TAKE 1 TABLET ONE TIME DAILY 90 tablet 3  . polyethylene glycol (MIRALAX / GLYCOLAX) packet Take 17 g by mouth daily.    . tamoxifen (NOLVADEX) 20 MG tablet Take 1 tablet (20 mg total) by mouth daily. 90 tablet 3  . furosemide (LASIX) 20 MG tablet Take 1 tablet (20 mg total) by mouth daily. (Patient not taking: Reported on 11/27/2017) 90 tablet 3   No current facility-administered medications for this visit.     OBJECTIVE: Vitals:   11/27/17 1434  BP: 138/84  Pulse: 68  Resp: 18  Temp: (!) 97.1 F (36.2 C)     Body mass index is 28.74 kg/m.    ECOG FS:0 - Asymptomatic  General: Well-developed, well-nourished, no acute distress. Eyes: Pink conjunctiva, anicteric sclera. Breast: Patient declined exam today. Lungs: Clear to auscultation bilaterally. Heart: Regular rate and rhythm. No rubs, murmurs, or gallops. Abdomen: Soft, nontender, nondistended. No organomegaly noted, normoactive bowel sounds. Musculoskeletal: No edema, cyanosis, or clubbing. Neuro: Alert, answering all questions appropriately. Cranial nerves grossly intact. Skin: No rashes or petechiae noted. Psych: Normal affect.    LAB RESULTS:  Lab Results  Component Value Date   NA 141 06/01/2017   K 3.9 06/01/2017   CL 105 06/01/2017   CO2 27  06/01/2017   GLUCOSE 85 06/01/2017   BUN 26 (H) 06/01/2017   CREATININE 1.23 (H) 06/01/2017   CALCIUM 10.0 06/01/2017   PROT 6.9 06/01/2017   ALBUMIN 4.4 06/01/2017   AST 20 06/01/2017   ALT 16 06/01/2017   ALKPHOS 52 06/01/2017   BILITOT 1.2 06/01/2017   GFRNONAA 42 (L) 06/01/2017   GFRAA 49 (L) 06/01/2017    Lab Results  Component Value Date   WBC 4.8 08/15/2017   NEUTROABS 2.6 06/01/2017   HGB 14.2 08/15/2017   HCT 41.9 08/15/2017   MCV 87.0 08/15/2017   PLT 94 (L) 08/15/2017     STUDIES: No results found.  ASSESSMENT: DCIS, right breast  PLAN:    1. DCIS, right breast: Patient underwent lumpectomy on June 07, 2017.  Final  pathology was reviewed and no invasive component was noted, there patient did not require chemotherapy.  She completed adjuvant XRT in January 2019.  Continue tamoxifen for 5 years completing in January 2024.  Patient will require repeat mammogram in approximately October 2019.  Return to clinic in 6 months for further evaluation at which time a breast exam can be performed.  2.  Thrombocytopenia: Decreased, but stable.  Consider full workup in the future if platelet count continues to decline. 3.  Cough: Recommended OTC treatments. 4.  Hot flashes: Mild, continue tamoxifen as above.  Patient expressed understanding and was in agreement with this plan. She also understands that She can call clinic at any time with any questions, concerns, or complaints.   Cancer Staging Ductal carcinoma in situ (DCIS) of right breast Staging form: Breast, AJCC 8th Edition - Clinical stage from 08/22/2017: Stage 0 (cTis (DCIS), cN0, cM0, ER: Positive, PR: Negative, HER2: Negative) - Signed by Lloyd Huger, MD on 08/22/2017   Lloyd Huger, MD   11/27/2017 2:37 PM

## 2017-11-27 ENCOUNTER — Inpatient Hospital Stay: Payer: PPO | Attending: Oncology | Admitting: Oncology

## 2017-11-27 ENCOUNTER — Encounter: Payer: Self-pay | Admitting: Oncology

## 2017-11-27 VITALS — BP 138/84 | HR 68 | Temp 97.1°F | Resp 18 | Wt 152.1 lb

## 2017-11-27 DIAGNOSIS — Z170 Estrogen receptor positive status [ER+]: Secondary | ICD-10-CM | POA: Diagnosis not present

## 2017-11-27 DIAGNOSIS — D0511 Intraductal carcinoma in situ of right breast: Secondary | ICD-10-CM | POA: Insufficient documentation

## 2017-11-27 DIAGNOSIS — D696 Thrombocytopenia, unspecified: Secondary | ICD-10-CM | POA: Diagnosis not present

## 2017-11-27 DIAGNOSIS — N951 Menopausal and female climacteric states: Secondary | ICD-10-CM | POA: Diagnosis not present

## 2017-11-27 DIAGNOSIS — R05 Cough: Secondary | ICD-10-CM | POA: Insufficient documentation

## 2017-11-27 DIAGNOSIS — Z79810 Long term (current) use of selective estrogen receptor modulators (SERMs): Secondary | ICD-10-CM | POA: Diagnosis not present

## 2017-11-27 NOTE — Progress Notes (Signed)
Patient here today for follow up regarding DCIS. Patient reports hot flashes and hair loss with Tamoxifen. Patient also reports cough x 2 weeks.

## 2017-11-29 ENCOUNTER — Encounter: Payer: Self-pay | Admitting: Family Medicine

## 2017-12-12 ENCOUNTER — Ambulatory Visit (INDEPENDENT_AMBULATORY_CARE_PROVIDER_SITE_OTHER): Payer: PPO

## 2017-12-12 ENCOUNTER — Ambulatory Visit (INDEPENDENT_AMBULATORY_CARE_PROVIDER_SITE_OTHER): Payer: PPO | Admitting: Family Medicine

## 2017-12-12 ENCOUNTER — Other Ambulatory Visit: Payer: Self-pay

## 2017-12-12 ENCOUNTER — Encounter: Payer: Self-pay | Admitting: Family Medicine

## 2017-12-12 VITALS — BP 130/70 | HR 64 | Temp 97.8°F | Wt 152.8 lb

## 2017-12-12 DIAGNOSIS — R05 Cough: Secondary | ICD-10-CM

## 2017-12-12 DIAGNOSIS — R0602 Shortness of breath: Secondary | ICD-10-CM | POA: Diagnosis not present

## 2017-12-12 DIAGNOSIS — R051 Acute cough: Secondary | ICD-10-CM | POA: Insufficient documentation

## 2017-12-12 DIAGNOSIS — R059 Cough, unspecified: Secondary | ICD-10-CM

## 2017-12-12 MED ORDER — DOXYCYCLINE HYCLATE 100 MG PO TABS: 100.0000 mg | ORAL_TABLET | Freq: Two times a day (BID) | ORAL | 0 refills | Status: DC

## 2017-12-12 NOTE — Assessment & Plan Note (Addendum)
Patient with symptoms concerning for bronchitis and allergy symptoms.  Vitals stable.  Patient is stable for outpatient management.  She will continue Allegra and Flonase.  Given shortness of breath will obtain a chest x-ray.  We will treat with doxycycline.  She is given return precautions.

## 2017-12-12 NOTE — Progress Notes (Signed)
  Tommi Rumps, MD Phone: [phone]  Gwendolyn King is a 77 y.o. female who presents today for same day visit.   Patient notes for the last 3 to 4 weeks she has had symptoms that she thinks are allergies.  She notes her eyes itch and she has postnasal drip.  Some nasal and sinus congestion as well.  She has developed chest congestion that has worsened.  She is coughing up creamy colored mucus.  T-max of 100 F last night.  She has had some chills.  She feels short of breath.  She notes no wheezing.  No chest pain.  She does note her lower ribs are sore when she coughs.  She has been taking Tussin at home. She has been using allegra and flonase.   Social History   Tobacco Use  Smoking Status Never Smoker  Smokeless Tobacco Never Used     ROS see history of present illness  Objective  Physical Exam Vitals:   12/12/17 1515  BP: 130/70  Pulse: 64  Temp: 97.8 F (36.6 C)  SpO2: 98%   Ambulatory O2 sat 96%  BP Readings from Last 3 Encounters:  12/12/17 130/70  11/27/17 138/84  09/27/17 (!) 138/58   Wt Readings from Last 3 Encounters:  12/12/17 152 lb 12.8 oz (69.3 kg)  11/27/17 152 lb 1.6 oz (69 kg)  09/27/17 152 lb 12.5 oz (69.3 kg)    Physical Exam  Constitutional: No distress.  HENT:  Head: Normocephalic and atraumatic.  Mouth/Throat: Oropharynx is clear and moist.  Eyes: Pupils are equal, round, and reactive to light. Conjunctivae are normal.  Neck: Neck supple.  Cardiovascular: Normal rate, regular rhythm and normal heart sounds.  Pulmonary/Chest: Effort normal and breath sounds normal.  Musculoskeletal: She exhibits no edema.  Lymphadenopathy:    She has no cervical adenopathy.  Neurological: She is alert.  Skin: Skin is warm and dry. She is not diaphoretic.     Assessment/Plan: Please see individual problem list.  Cough Patient with symptoms concerning for bronchitis and allergy symptoms.  Vitals stable.  Patient is stable for outpatient management.   She will continue Allegra and Flonase.  Given shortness of breath will obtain a chest x-ray.  We will treat with doxycycline.  She is given return precautions.   Orders Placed This Encounter  Procedures  . DG Chest 2 View    Standing Status:   Future    Number of Occurrences:   1    Standing Expiration Date:   02/12/2019    Order Specific Question:   Reason for Exam (SYMPTOM  OR DIAGNOSIS REQUIRED)    Answer:   cough, dyspnea, 3-4 weeks    Order Specific Question:   Preferred imaging location?    Answer:   [facility] Specific Question:   Radiology Contrast Protocol - do NOT remove file path    Answer:   [email or web address]    Meds ordered this encounter  Medications  . doxycycline (VIBRA-TABS) 100 MG tablet    Sig: Take 1 tablet (100 mg total) by mouth 2 (two) times daily.    Dispense:  14 tablet    Refill:  0     Tommi Rumps, MD [place]

## 2017-12-12 NOTE — Patient Instructions (Signed)
Nice to see you. I suspect you got bronchitis.  He also has some allergy-like symptoms. We will check a chest x-ray.  Will start on doxycycline as an antibiotic.  Please continue the Flonase and Allegra. If you develop chest pain, shortness of breath that is worsening, or coughing up blood or any new or worsening symptoms please be evaluated immediately.

## 2017-12-13 ENCOUNTER — Encounter: Payer: Self-pay | Admitting: Family Medicine

## 2017-12-17 ENCOUNTER — Ambulatory Visit: Payer: Self-pay | Admitting: *Deleted

## 2017-12-17 DIAGNOSIS — J209 Acute bronchitis, unspecified: Secondary | ICD-10-CM | POA: Diagnosis not present

## 2017-12-17 NOTE — Telephone Encounter (Signed)
Patient notified and states she will go to the walk in

## 2017-12-17 NOTE — Telephone Encounter (Signed)
Given that she continues to have symptoms and it does not sound as though they have improved I would suggest she be reevaluated to determine if anything needs to be changed.  I would suggest evaluation at the walk-in clinic.

## 2017-12-17 NOTE — Telephone Encounter (Signed)
Patient called stating that Dr. Caryl Bis requested the patient to call back in a week if she is not feeling better. Please call patient.    Thank You!!!  Patient still has cough present- she has some chest tightness with dry coughing. Patient is using Musinex pills and she is not sure it is really helping. She has 2 more days of antibiotics left. Can something be sent in for cough? Best contant [phone]  Answer Assessment - Initial Assessment Questions 1. ONSET: "When did the cough begin?"      1 month now- still persist 2. SEVERITY: "How bad is the cough today?"      Sometimes productive and sometimes hacking. Occurs with talking - and at night feels like a feather is tickling 3. RESPIRATORY DISTRESS: "Describe your breathing."      Patient is breathing more shallow- not taking as deep of breaths 4. FEVER: "Do you have a fever?" If so, ask: "What is your temperature, how was it measured, and when did it start?"     No fever 5. SPUTUM: "Describe the color of your sputum" (clear, white, yellow, green)     Yellowish to clear 6. HEMOPTYSIS: "Are you coughing up any blood?" If so ask: "How much?" (flecks, streaks, tablespoons, etc.)     no 7. CARDIAC HISTORY: "Do you have any history of heart disease?" (e.g., heart attack, congestive heart failure)      heart medication 8. LUNG HISTORY: "Do you have any history of lung disease?"  (e.g., pulmonary embolus, asthma, emphysema)     Hx of asthma- childhood 9. PE RISK FACTORS: "Do you have a history of blood clots?" (or: recent major surgery, recent prolonged travel, bedridden )     no 10. OTHER SYMPTOMS: "Do you have any other symptoms?" (e.g., runny nose, wheezing, chest pain)       Chest tightness,sinus drainage 11. PREGNANCY: "Is there any chance you are pregnant?" "When was your last menstrual period?"       n/a 12. TRAVEL: "Have you traveled out of the country in the last month?" (e.g., travel history, exposures)        n/a  Protocols used: [place]

## 2017-12-17 NOTE — Telephone Encounter (Signed)
Please advise 

## 2017-12-27 ENCOUNTER — Encounter: Payer: Self-pay | Admitting: Family Medicine

## 2017-12-27 ENCOUNTER — Ambulatory Visit: Payer: Self-pay | Admitting: *Deleted

## 2017-12-27 ENCOUNTER — Ambulatory Visit (INDEPENDENT_AMBULATORY_CARE_PROVIDER_SITE_OTHER): Payer: PPO | Admitting: Family Medicine

## 2017-12-27 VITALS — BP 130/64 | HR 59 | Temp 98.2°F | Resp 15 | Wt 152.2 lb

## 2017-12-27 DIAGNOSIS — J209 Acute bronchitis, unspecified: Secondary | ICD-10-CM

## 2017-12-27 DIAGNOSIS — J309 Allergic rhinitis, unspecified: Secondary | ICD-10-CM

## 2017-12-27 MED ORDER — BENZONATATE 100 MG PO CAPS: 100.0000 mg | ORAL_CAPSULE | Freq: Three times a day (TID) | ORAL | 0 refills | Status: DC

## 2017-12-27 MED ORDER — PREDNISONE 10 MG PO TABS: ORAL_TABLET | ORAL | 0 refills | Status: DC

## 2017-12-27 NOTE — Patient Instructions (Signed)
Please take medication with food as directed.  Benzonatate for cough and you have your inhaler to use if needed.  Consider switching to Zyrtec for allergy symptoms. Continue flonase.  If symptoms do not improve with treatment, worsen, or new symptoms develop, please follow up with Dr. Caryl Bis for further evaluation and treatment.  Feel better soon!   Acute Bronchitis, Adult Acute bronchitis is when air tubes (bronchi) in the lungs suddenly get swollen. The condition can make it hard to breathe. It can also cause these symptoms:  A cough.  Coughing up clear, yellow, or green mucus.  Wheezing.  Chest congestion.  Shortness of breath.  A fever.  Body aches.  Chills.  A sore throat.  Follow these instructions at home: Medicines  Take over-the-counter and prescription medicines only as told by your doctor.  If you were prescribed an antibiotic medicine, take it as told by your doctor. Do not stop taking the antibiotic even if you start to feel better. General instructions  Rest.  Drink enough fluids to keep your pee (urine) clear or pale yellow.  Avoid smoking and secondhand smoke. If you smoke and you need help quitting, ask your doctor. Quitting will help your lungs heal faster.  Use an inhaler, cool mist vaporizer, or humidifier as told by your doctor.  Keep all follow-up visits as told by your doctor. This is important. How is this prevented? To lower your risk of getting this condition again:  Wash your hands often with soap and water. If you cannot use soap and water, use hand sanitizer.  Avoid contact with people who have cold symptoms.  Try not to touch your hands to your mouth, nose, or eyes.  Make sure to get the flu shot every year.  Contact a doctor if:  Your symptoms do not get better in 2 weeks. Get help right away if:  You cough up blood.  You have chest pain.  You have very bad shortness of breath.  You become dehydrated.  You  faint (pass out) or keep feeling like you are going to pass out.  You keep throwing up (vomiting).  You have a very bad headache.  Your fever or chills gets worse. This information is not intended to replace advice given to you by your health care provider. Make sure you discuss any questions you have with your health care provider. Document Released: 01/24/2008 Document Revised: 03/15/2016 Document Reviewed: 01/26/2016 Elsevier Interactive Patient Education  [employer].

## 2017-12-27 NOTE — Progress Notes (Signed)
 Subjective:    Patient ID: Gwendolyn King, female    DOB: 05/14/1941, 77 y.o.   MRN: [id number]  HPI   Gwendolyn King is a 77 year old female who presents today with a cough that has been present for 6 weeks. She was evaluated on 12/12/17 for allergy symptoms and cough with fever, chills, and report of SOB. Symptoms were concerning for bronchitis and allergies and with SOB chest X-ray was obtained. She was also treated with doxycyline which she completed without adverse effect. She reported improvement with fever, chills, however cough has persisted Chest X-ray on 12/12/17 did not reveal pneumonia.   She was evaluated at a walk in clinic on 12/17/17 where she was provided prednisone and albuterol due to cough and suspected bronchitis. She completed 5 days of 20 mg daily of prednisone which provided moderate benefit. Cough has persisted and she has used her albuterol inhaler twice yesterday with moderate benefit She continues to use Allegra and Flonase which provides benefit throughout the year however she reports that in the spring and fall, Allegra does not work as well.  Today, cough is now nonproductive and she denies fever or chills. Associated post nasal drip and itchy/watery eyes. She does have symptoms of cough and worsening post nasal drip after working in her yard. She denies wheezing, chest pain,palpitations, fever, chills, sweats, N/V/D, or SOB.   Review of Systems  Constitutional: Negative for chills, fatigue and fever.  HENT: Positive for postnasal drip and rhinorrhea. Negative for congestion, sinus pressure, sinus pain, sneezing and sore throat.   Respiratory: Positive for cough. Negative for shortness of breath and wheezing.   Cardiovascular: Negative for chest pain and palpitations.  Gastrointestinal: Negative for abdominal pain, diarrhea, nausea and vomiting.  Skin: Negative for rash.  Neurological: Negative for dizziness, weakness and headaches.   Past Medical History:  Diagnosis  Date  . Allergy    hay fever, fall allergies  . Arthritis   . Asthma    as a child, dad was smoker  . 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  . Chicken pox   . Colon polyp   . Constipation   . Hyperlipidemia   . Hypothyroidism   . Palpitations   . Thyroid disease      Social History   Socioeconomic History  . Marital status: Married    Spouse name: Not on file  . Number of children: Not on file  . Years of education: Not on file  . Highest education level: Not on file  Occupational History  . Not on file  Social Needs  . Financial resource strain: Not on file  . Food insecurity:    Worry: Not on file    Inability: Not on file  . Transportation needs:    Medical: Not on file    Non-medical: Not on file  Tobacco Use  . Smoking status: Never Smoker  . Smokeless tobacco: Never Used  Substance and Sexual Activity  . Alcohol use: No  . Drug use: No  . Sexual activity: Not on file  Lifestyle  . Physical activity:    Days per week: Not on file    Minutes per session: Not on file  . Stress: Not on file  Relationships  . Social connections:    Talks on phone: Not on file    Gets together: Not on file    Attends religious service: Not   on file    Active member of club or organization: Not on file    Attends meetings of clubs or organizations: Not on file    Relationship status: Not on file  . Intimate partner violence:    Fear of current or ex partner: Not on file    Emotionally abused: Not on file    Physically abused: Not on file    Forced sexual activity: Not on file  Other Topics Concern  . Not on file  Social History Narrative   Lives with husband in [place]. 3 children.      Work - retired, Pharmacist, community, Pharmacist, hospital      Diet - regular, limits sugar, eats small meals   Exercise - Jazzercise 4 days per week    Past Surgical History:  Procedure Laterality Date  .  ABDOMINAL HYSTERECTOMY  1981  . APPENDECTOMY  2012  . 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 BREAST LUMPECTOMY Right 06/07/2017   DUCTAL CARCINOMA IN SITU (DCIS)  . BREAST LUMPECTOMY Right 06/07/2017   PAPILLARY LESION CONSISTENT WITH PAPILLARY CARCINOMA  . BREAST LUMPECTOMY WITH SENTINEL LYMPH NODE BIOPSY Right 06/07/2017   Wide excision of 2 foci of high-grade DCIS, microinvasion noted only on core biopsy. 2 microscopic foci lateral inferior margins. Patient elected not to proceed to reexcision.  . COLONOSCOPY    . COLONOSCOPY W/ POLYPECTOMY    . FINGER FRACTURE SURGERY Left    5th  . HAMMER TOE SURGERY  09/20/2017  . JOINT REPLACEMENT     right knee  . ROTATOR CUFF REPAIR Right   . TONSILLECTOMY AND ADENOIDECTOMY  1963  . TOTAL KNEE ARTHROPLASTY Right 10/17/2016   Procedure: TOTAL KNEE ARTHROPLASTY;  Surgeon: Melrose Nakayama, MD;  Location: [facility];  Service: Orthopedics;  Laterality: Right;  Marland Kitchen VAGINAL DELIVERY     3    Family History  Problem Relation Age of Onset  . Arthritis Mother   . Heart disease Mother   . Hypertension Mother   . Arthritis Father   . Heart disease Father   . Hypertension Father   . Stroke Maternal Grandmother   . Cancer Maternal Aunt        abdominal?  . Hyperlipidemia Maternal Aunt   . Diabetes Paternal Aunt   . Hyperlipidemia Other   . Diabetes Other   . Breast cancer Neg Hx     Allergies  Allergen Reactions  . Lipitor [Atorvastatin] Other (See Comments)    Myalgia, arthralgia  . Simvastatin Other (See Comments)    myalgia    Current Outpatient Medications on File Prior to Visit  Medication Sig Dispense Refill  . acebutolol (SECTRAL) 200 MG capsule Take 1 capsule (200 mg total) by mouth daily. 90 capsule 3  . acetaminophen (TYLENOL) 500 MG tablet Take 1,000 mg by mouth at bedtime.    Marland Kitchen aspirin EC 81 MG tablet Take 81 mg by mouth daily.    . Biotin (BIOTIN  5000) 5 MG CAPS Take 5 mg by mouth daily.     . Cholecalciferol (VITAMIN D-3) 1000 UNITS CAPS Take 1,000 Units by mouth every evening.     . fexofenadine (ALLEGRA) 180 MG tablet Take 180 mg by mouth every evening.     . furosemide (LASIX) 20 MG tablet Take 1 tablet (20 mg total) by mouth daily. (Patient taking differently: Take 20 mg by mouth daily as needed. ) 90 tablet 3  .  KRILL OIL PO Take 1 capsule by mouth daily.     Marland Kitchen LEVOXYL 75 MCG tablet TAKE 1 TABLET ONE TIME DAILY 90 tablet 3  . polyethylene glycol (MIRALAX / GLYCOLAX) packet Take 17 g by mouth daily.    Marland Kitchen PROAIR HFA 108 (90 Base) MCG/ACT inhaler   0  . tamoxifen (NOLVADEX) 20 MG tablet Take 1 tablet (20 mg total) by mouth daily. 90 tablet 3   No current facility-administered medications on file prior to visit.     BP 130/64 (BP Location: Left Arm, Patient Position: Sitting, Cuff Size: Normal)   Pulse (!) 59   Temp 98.2 F (36.8 C) (Oral)   Resp 15   Wt 152 lb 4 oz (69.1 kg)   SpO2 99%   BMI 28.77 kg/m        Objective:   Physical Exam  Constitutional: She is oriented to person, place, and time. She appears well-developed and well-nourished.  HENT:  Right Ear: Tympanic membrane normal.  Left Ear: Tympanic membrane normal.  Nose: Rhinorrhea present. Right sinus exhibits no maxillary sinus tenderness and no frontal sinus tenderness. Left sinus exhibits no maxillary sinus tenderness and no frontal sinus tenderness.  Mouth/Throat: Oropharynx is clear and moist.  Eyes: Pupils are equal, round, and reactive to light. No scleral icterus.  Neck: Neck supple.  Cardiovascular: Normal rate and regular rhythm.  Pulmonary/Chest: Effort normal and breath sounds normal. She has no wheezes. She has no rales.  Musculoskeletal: She exhibits no edema.  Lymphadenopathy:    She has no cervical adenopathy.  Neurological: She is alert and oriented to person, place, and time.  Skin: Skin is warm and dry. No rash noted.  Psychiatric: She  has a normal mood and affect. Her behavior is normal. Judgment and thought content normal.        Assessment & Plan:  1. Acute bronchitis, unspecified organism Exam is reassuring; VSS; O2 sat 99%; symptoms are improving however cough remains. Symptoms are consistent with bronchitis that is resolving; We discussed that cough can persist with bronchitis and symptoms may also be exacerbated by allergic rhinitis. We discussed treatment options and we agreed to try a short taper of prednisone and benzonatate. We reviewed use of rescue inhaler if needed and also advised her to follow up with PCP if she is using albuterol >2 times weekly. Further advised her to increase water intake; enough to keep urine pale yellow or clear.   2. Allergic rhinitis, unspecified seasonality, unspecified trigger She will switch from Allegra to Zyrtec and continue use of flonase for symptoms.   We discussed that we did not find any infection that had higher probability of being bacterial such as pneumonia, strep throat, ear infection, bacterial sinusitis. We discussed signs that bacterial infection may have developed particularly fever or shortness of breath and reasons for follow up (symptoms worsen, last past expected time frame, new concerns arise).   Delano Metz, FNP-C

## 2017-12-27 NOTE — Telephone Encounter (Signed)
Patient triaged by [facility] nurse and has been scheduled to see Gregary Signs today.

## 2017-12-27 NOTE — Telephone Encounter (Signed)
Pt reports dry cough, "Bronchial tightness." Seen by Dr. Caryl Bis 12/12/17 'bronchitis.'  Completed course of antibiotics. Went to [facility] 12/17/17 as symptoms continued, placed on "steroids and albuterol." Pt states felt better last week now "cough back yesterday." States cough severe, awakens her at night. Non-productive, afebrile. Denies SOB, CP "feels tight like it did before." Reports mild sore throat. Appt made for this afternoon with J. Kordsmeier. Care advise given per protocol.  Reason for Disposition . Cough has been present for > 3 weeks  Answer Assessment - Initial Assessment Questions 1. ONSET: "When did the cough begin?"      6 weeks ago 2. SEVERITY: "How bad is the cough today?"      Severe, dry 3. RESPIRATORY DISTRESS: "Describe your breathing."      "Short when coughing only." 4. FEVER: "Do you have a fever?" If so, ask: "What is your temperature, how was it measured, and when did it start?"     no 5. HEMOPTYSIS: "Are you coughing up any blood?" If so ask: "How much?" (flecks, streaks, tablespoons, etc.)     no 6. TREATMENT: "What have you done so far to treat the cough?" (e.g., meds, fluids, humidifier)     ATB, steroids, albuterol 7. CARDIAC HISTORY: "Do you have any history of heart disease?" (e.g., heart attack, congestive heart failure)      no 8. LUNG HISTORY: "Do you have any history of lung disease?"  (e.g., pulmonary embolus, asthma, emphysema)     H/O asthma as a child 46. PE RISK FACTORS: "Do you have a history of blood clots?" (or: recent major surgery, recent prolonged travel, bedridden )     no 10. OTHER SYMPTOMS: "Do you have any other symptoms? (e.g., runny nose, wheezing, chest pain)       Throat sore, chest tightness  Protocols used: COUGH - ACUTE NON-PRODUCTIVE-A-AH

## 2017-12-31 ENCOUNTER — Encounter: Payer: Self-pay | Admitting: Gastroenterology

## 2018-01-02 ENCOUNTER — Ambulatory Visit: Payer: Self-pay

## 2018-01-02 DIAGNOSIS — J209 Acute bronchitis, unspecified: Secondary | ICD-10-CM | POA: Diagnosis not present

## 2018-01-02 DIAGNOSIS — H1032 Unspecified acute conjunctivitis, left eye: Secondary | ICD-10-CM | POA: Diagnosis not present

## 2018-01-02 NOTE — Telephone Encounter (Signed)
Patient was triaged and states that she will be going to [facility].

## 2018-01-02 NOTE — Telephone Encounter (Signed)
Pt called with some drainage from her left eye which started on Monday and today her eye is matted shut. Denies blurred vision or fever. She is finishing up being treated for bronchitis with a steroid. She has a cough and this morning she coughed up "a small ball of green" per patient. She has made an appointment with [facility] for this morning.  Pt advised to call back with worsening symptoms and fever. Voiced understanding.  Will route to flow at [facility] heath care at [facility]. Reason for Disposition . [1] Eye with yellow/green discharge or eyelashes stick together AND [2] NO PCP standing order to call in antibiotic eye drops  Answer Assessment - Initial Assessment Questions 1. EYE DISCHARGE: "Is the discharge in one or both eyes?" "What color is it?" "How much is there?" "When did the discharge start?"      Left eye which started on Monday with a little drainage and now has increased 2. REDNESS OF SCLERA: "Is the redness in one or both eyes?" "When did the redness start?"      Blood shot 3. EYELIDS: "Are the eyelids red or swollen?" If so, ask: "How much?"      Eyelid swollen 4. VISION: "Is there any difficulty seeing clearly?"      yes 5. PAIN: "Is there any pain? If so, ask: "How bad is it?" (Scale 1-10; or mild, moderate, severe)    - MILD (1-3): doesn't interfere with normal activities     - MODERATE (4-7): interferes with normal activities or awakens from sleep    - SEVERE (8-10): excruciating pain, unable to do any normal activities       No pain but itchy feel  6. CONTACT LENS: "Do you wear contacts?"     no 7. OTHER SYMPTOMS: "Do you have any other symptoms?" (e.g., fever, runny nose, cough)     Cough  8. PREGNANCY: "Is there any chance you are pregnant?" "When was your last menstrual period?"     no  Protocols used: EYE - PUS OR DISCHARGE-A-AH

## 2018-01-14 ENCOUNTER — Encounter: Payer: Self-pay | Admitting: Family Medicine

## 2018-01-17 ENCOUNTER — Ambulatory Visit (INDEPENDENT_AMBULATORY_CARE_PROVIDER_SITE_OTHER): Payer: PPO | Admitting: Family

## 2018-01-17 VITALS — BP 130/66 | HR 80 | Temp 98.2°F | Resp 16 | Wt 156.1 lb

## 2018-01-17 DIAGNOSIS — H6501 Acute serous otitis media, right ear: Secondary | ICD-10-CM

## 2018-01-17 DIAGNOSIS — J40 Bronchitis, not specified as acute or chronic: Secondary | ICD-10-CM | POA: Diagnosis not present

## 2018-01-17 DIAGNOSIS — R600 Localized edema: Secondary | ICD-10-CM

## 2018-01-17 MED ORDER — FUROSEMIDE 20 MG PO TABS: 20.0000 mg | ORAL_TABLET | Freq: Every day | ORAL | 0 refills | Status: DC

## 2018-01-17 NOTE — Progress Notes (Signed)
Subjective:    Patient ID: Gwendolyn King, female    DOB: 1941/03/11, 77 y.o.   MRN: [id number]  HPI  Gwendolyn King is a 77 yr old female who presents today with chief complaint of right sided ear fullness.  Feels "like I am talking in a barrell."  She reports that she was treated for bronchitis back in April with prednisone, abx and albuterol. Reports that symptoms are finally resolved.   Today she does reports LE edema. Denies current sob.  Reports that she took one tablet of furosemide on Sunday which helped.  Needs new rx. Reports improvement in her SOB. Had not been using regularly previously- just as needed.   Right ear fullness- was taking allegra but recently switched to zyrtec. Started flonase 2 days ago.     Review of Systems See HPI  Past Medical History:  Diagnosis Date  . Allergy    hay fever, fall allergies  . Arthritis   . Asthma    as a child, dad was smoker  . 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  . Chicken pox   . Colon polyp   . Constipation   . Hyperlipidemia   . Hypothyroidism   . Palpitations   . Thyroid disease      Social History   Socioeconomic History  . Marital status: Married    Spouse name: Not on file  . Number of children: Not on file  . Years of education: Not on file  . Highest education level: Not on file  Occupational History  . Not on file  Social Needs  . Financial resource strain: Not on file  . Food insecurity:    Worry: Not on file    Inability: Not on file  . Transportation needs:    Medical: Not on file    Non-medical: Not on file  Tobacco Use  . Smoking status: Never Smoker  . Smokeless tobacco: Never Used  Substance and Sexual Activity  . Alcohol use: No  . Drug use: No  . Sexual activity: Not on file  Lifestyle  . Physical activity:    Days per week: Not on file    Minutes per session: Not on file  . Stress: Not on file    Relationships  . Social connections:    Talks on phone: Not on file    Gets together: Not on file    Attends religious service: Not on file    Active member of club or organization: Not on file    Attends meetings of clubs or organizations: Not on file    Relationship status: Not on file  . Intimate partner violence:    Fear of current or ex partner: Not on file    Emotionally abused: Not on file    Physically abused: Not on file    Forced sexual activity: Not on file  Other Topics Concern  . Not on file  Social History Narrative   Lives with husband in [place]. 3 children.      Work - retired, Pharmacist, community, Pharmacist, hospital      Diet - regular, limits sugar, eats small meals   Exercise - Jazzercise 4 days per week    Past Surgical History:  Procedure Laterality Date  . ABDOMINAL HYSTERECTOMY  1981  . APPENDECTOMY  2012  . BREAST BIOPSY Right 05/08/2017   Affirm Bx-DUCTAL CARCINOMA IN SITU (DCIS) WITH ONE  FOCUS SUSPICIOUS FOR invasive  . BREAST LUMPECTOMY Right 06/07/2017   DUCTAL CARCINOMA IN SITU (DCIS)  . BREAST LUMPECTOMY Right 06/07/2017   PAPILLARY LESION CONSISTENT WITH PAPILLARY CARCINOMA  . BREAST LUMPECTOMY WITH SENTINEL LYMPH NODE BIOPSY Right 06/07/2017   Wide excision of 2 foci of high-grade DCIS, microinvasion noted only on core biopsy. 2 microscopic foci lateral inferior margins. Patient elected not to proceed to reexcision.  . COLONOSCOPY    . COLONOSCOPY W/ POLYPECTOMY    . FINGER FRACTURE SURGERY Left    5th  . HAMMER TOE SURGERY  09/20/2017  . JOINT REPLACEMENT     right knee  . ROTATOR CUFF REPAIR Right   . TONSILLECTOMY AND ADENOIDECTOMY  1963  . TOTAL KNEE ARTHROPLASTY Right 10/17/2016   Procedure: TOTAL KNEE ARTHROPLASTY;  Surgeon: Melrose Nakayama, MD;  Location: [facility];  Service: Orthopedics;  Laterality: Right;  Marland Kitchen VAGINAL DELIVERY     3    Family History  Problem Relation Age of Onset  . Arthritis Mother   . Heart disease Mother   .  Hypertension Mother   . Arthritis Father   . Heart disease Father   . Hypertension Father   . Stroke Maternal Grandmother   . Cancer Maternal Aunt        abdominal?  . Hyperlipidemia Maternal Aunt   . Diabetes Paternal Aunt   . Hyperlipidemia Other   . Diabetes Other   . Breast cancer Neg Hx     Allergies  Allergen Reactions  . Lipitor [Atorvastatin] Other (See Comments)    Myalgia, arthralgia  . Simvastatin Other (See Comments)    myalgia    Current Outpatient Medications on File Prior to Visit  Medication Sig Dispense Refill  . acebutolol (SECTRAL) 200 MG capsule Take 1 capsule (200 mg total) by mouth daily. 90 capsule 3  . acetaminophen (TYLENOL) 500 MG tablet Take 1,000 mg by mouth at bedtime.    Marland Kitchen aspirin EC 81 MG tablet Take 81 mg by mouth daily.    . Biotin (BIOTIN 5000) 5 MG CAPS Take 5 mg by mouth daily.     . Cholecalciferol (VITAMIN D-3) 1000 UNITS CAPS Take 1,000 Units by mouth every evening.     . fexofenadine (ALLEGRA) 180 MG tablet Take 180 mg by mouth every evening.     . fluticasone (FLONASE) 50 MCG/ACT nasal spray Place 2 sprays into both nostrils daily.    Marland Kitchen KRILL OIL PO Take 1 capsule by mouth daily.     Marland Kitchen LEVOXYL 75 MCG tablet TAKE 1 TABLET ONE TIME DAILY 90 tablet 3  . polyethylene glycol (MIRALAX / GLYCOLAX) packet Take 17 g by mouth daily.    Marland Kitchen PROAIR HFA 108 (90 Base) MCG/ACT inhaler   0  . tamoxifen (NOLVADEX) 20 MG tablet Take 1 tablet (20 mg total) by mouth daily. 90 tablet 3  . benzonatate (TESSALON) 100 MG capsule Take 1 capsule (100 mg total) by mouth 3 (three) times daily. (Patient not taking: Reported on 01/17/2018) 20 capsule 0  . furosemide (LASIX) 20 MG tablet Take 1 tablet (20 mg total) by mouth daily. (Patient not taking: Reported on 01/17/2018) 90 tablet 3  . predniSONE (DELTASONE) 10 MG tablet Take 4 tablets once daily for 2 days, 3 tabs daily for 2 days, 2 tabs daily for 2 days, 1 tab daily for 2 days. (Patient not taking: Reported on  01/17/2018) 20 tablet 0   No current facility-administered medications on file prior to visit.  BP 130/66 (BP Location: Left Arm, Patient Position: Sitting, Cuff Size: Normal)   Pulse 80   Temp 98.2 F (36.8 C) (Oral)   Resp 16   Wt 156 lb 2 oz (70.8 kg)   SpO2 98%   BMI 29.50 kg/m       Objective:   Physical Exam  Constitutional: She appears well-developed and well-nourished.  HENT:  Head: Normocephalic and atraumatic.  Right Ear: External ear normal.  Left Ear: Tympanic membrane, external ear and ear canal normal.  Mouth/Throat: Oropharynx is clear and moist.  R TM intact with clear serous effusion, no bulging or erythema.   Cardiovascular: Normal rate, regular rhythm and normal heart sounds.  No murmur heard. Pulmonary/Chest: Effort normal and breath sounds normal. No respiratory distress. She has no wheezes.  Musculoskeletal:  2+ bilateral pedal edema  Psychiatric: She has a normal mood and affect. Her behavior is normal. Judgment and thought content normal.          Assessment & Plan:  Serous OM- right. No clinical sign of infection. Pt is advised as follows:  Continue Zyrtec and flonase 2 spray each nostril once daily. Call if symptoms are not improved in 1 week.   LE edema- mild. Lungs clear, no SOB today. Advised pt to take lasix once daily x 3 days then return to prn.   Bronchitis- clinically resolved.  Monitor.

## 2018-01-17 NOTE — Patient Instructions (Addendum)
Continue zyrtec once daily.  Continue flonase 2 spray each nostril once daily. Take lasix one tab once daily for the next 2-3 days for swelling. Then return to an as needed basis. Call if swelling worsens or if it does not improve with lasix as above. Let us know if ear fullness worsens or if not improved in 1 week.

## 2018-01-22 ENCOUNTER — Telehealth: Payer: Self-pay | Admitting: Family Medicine

## 2018-01-22 ENCOUNTER — Encounter: Payer: Self-pay | Admitting: Family Medicine

## 2018-01-22 DIAGNOSIS — H938X3 Other specified disorders of ear, bilateral: Secondary | ICD-10-CM

## 2018-01-22 NOTE — Telephone Encounter (Signed)
This was prescribed by Debbrah Alar please advise

## 2018-01-22 NOTE — Telephone Encounter (Signed)
Copied from [facility] [id number]. Topic: Quick Communication - See Telephone Encounter >> Jan 22, 2018  9:05 AM Aurelio Brash B wrote: CRM for notification. See Telephone encounter for: 01/22/18.  Gwendolyn King from [facility]  needs clarification on furosemide (LASIX) 20 MG tablet  rx       [phone]

## 2018-01-23 MED ORDER — FUROSEMIDE 20 MG PO TABS: 20.0000 mg | ORAL_TABLET | Freq: Every day | ORAL | 0 refills | Status: DC

## 2018-01-23 NOTE — Telephone Encounter (Signed)
Dr Caryl Bis sent in 90 day of Lasix to [facility]

## 2018-01-23 NOTE — Telephone Encounter (Signed)
Please advise 

## 2018-01-23 NOTE — Telephone Encounter (Signed)
Gwendolyn King with [facility] calling back to check on status. They are requesting the script be changed to 90 day supply. It is cheaper for the patient and insurance prefers a 90 day supply. He stated a call back was not necessary, if a new script could be sent in for a 90 day supply.

## 2018-01-25 NOTE — Telephone Encounter (Signed)
[facility] responded. Since this is not an urgent referral they have scheduled her for 6/18. I can try [place]?

## 2018-01-28 DIAGNOSIS — M79674 Pain in right toe(s): Secondary | ICD-10-CM | POA: Diagnosis not present

## 2018-02-05 ENCOUNTER — Encounter: Payer: Self-pay | Admitting: Family Medicine

## 2018-02-05 ENCOUNTER — Other Ambulatory Visit: Payer: Self-pay | Admitting: Physician Assistant

## 2018-02-05 DIAGNOSIS — H903 Sensorineural hearing loss, bilateral: Secondary | ICD-10-CM

## 2018-02-05 DIAGNOSIS — J399 Disease of upper respiratory tract, unspecified: Secondary | ICD-10-CM | POA: Diagnosis not present

## 2018-02-05 DIAGNOSIS — I10 Essential (primary) hypertension: Secondary | ICD-10-CM | POA: Diagnosis not present

## 2018-02-05 DIAGNOSIS — H6980 Other specified disorders of Eustachian tube, unspecified ear: Secondary | ICD-10-CM | POA: Diagnosis not present

## 2018-02-05 DIAGNOSIS — H9319 Tinnitus, unspecified ear: Secondary | ICD-10-CM | POA: Diagnosis not present

## 2018-02-18 ENCOUNTER — Ambulatory Visit
Admission: RE | Admit: 2018-02-18 | Discharge: 2018-02-18 | Disposition: A | Discharge status: Home / Self Care | Payer: PPO | Source: Ambulatory Visit | Attending: Physician Assistant | Admitting: Physician Assistant

## 2018-02-18 DIAGNOSIS — H748X3 Other specified disorders of middle ear and mastoid, bilateral: Secondary | ICD-10-CM | POA: Diagnosis not present

## 2018-02-18 DIAGNOSIS — H903 Sensorineural hearing loss, bilateral: Secondary | ICD-10-CM | POA: Diagnosis present

## 2018-02-18 DIAGNOSIS — H9041 Sensorineural hearing loss, unilateral, right ear, with unrestricted hearing on the contralateral side: Secondary | ICD-10-CM | POA: Diagnosis not present

## 2018-02-18 LAB — POCT I-STAT CREATININE: Creatinine, Ser: 1.1 mg/dL — ABNORMAL HIGH (ref 0.44–1.00)

## 2018-02-18 MED ORDER — GADOBENATE DIMEGLUMINE 529 MG/ML IV SOLN
15.0000 mL | Freq: Once | INTRAVENOUS | Status: AC | PRN
  Administered 2018-02-18: 14 mL via INTRAVENOUS

## 2018-02-20 DIAGNOSIS — J309 Allergic rhinitis, unspecified: Secondary | ICD-10-CM | POA: Diagnosis not present

## 2018-02-20 DIAGNOSIS — G238 Other specified degenerative diseases of basal ganglia: Secondary | ICD-10-CM | POA: Diagnosis not present

## 2018-02-20 DIAGNOSIS — H6980 Other specified disorders of Eustachian tube, unspecified ear: Secondary | ICD-10-CM | POA: Diagnosis not present

## 2018-02-20 DIAGNOSIS — G501 Atypical facial pain: Secondary | ICD-10-CM | POA: Diagnosis not present

## 2018-02-20 DIAGNOSIS — H903 Sensorineural hearing loss, bilateral: Secondary | ICD-10-CM | POA: Diagnosis not present

## 2018-02-23 ENCOUNTER — Telehealth: Payer: Self-pay | Admitting: Family Medicine

## 2018-02-23 DIAGNOSIS — R9089 Other abnormal findings on diagnostic imaging of central nervous system: Secondary | ICD-10-CM

## 2018-02-23 NOTE — Telephone Encounter (Signed)
Please let the patient know that I received a copy of her MRI from her ear nose and throat physician.  There appears to be a small prominent perivascular area noted in a specific area in her brain and is of undetermined significance.  I would like to have her see neurology to determine if this is a significant finding.  If she is willing I can place a referral.  Thanks.

## 2018-02-25 ENCOUNTER — Telehealth: Payer: Self-pay | Admitting: *Deleted

## 2018-02-25 NOTE — Telephone Encounter (Signed)
I attempted to contact the patient.  There was no answer.  I left a message asking her to call back to the office.  The small amount of fluid potentially could be residual from her prior issues with infection.  I do not know why the PA said she did not have any fluid and I would suggest she contact the ENT office to discuss this finding with them further.  Please confirm whether or not she is okay seeing neurology.

## 2018-02-25 NOTE — Telephone Encounter (Signed)
Left message to return call, ok for pec to speak to patient 

## 2018-02-25 NOTE — Telephone Encounter (Signed)
Elliot Cousin, RN  [facility] 1 hour ago (12:44 PM)   Please advise   Routing comment     Elliot Cousin, RN 1 hour ago (12:41 PM)     Result note from Dr. Caryl Bis read to patient; pt has multiple questions and concerns. Requests call back from practice.  [phone]  Telephone encounter as result note was not routed to [facility].

## 2018-02-25 NOTE — Telephone Encounter (Signed)
Result note from Dr. Caryl Bis read to patient; pt has multiple questions and concerns. Requests call back from practice.  [phone]  Telephone encounter as result note was not routed to [facility]

## 2018-02-25 NOTE — Telephone Encounter (Signed)
Patient states the report states there is a small amount of mastoid fluid but the PA informed her there was no fluid present. Patient states she would like to know what she needs this for and what exactly this is. Patient wasn't to know why the mri said she had fluid and the PA from [facility] ent stated there was no fluid. Patient was very upset on the phone and states she just wants to get rid of the fluid. She would like for you to possibly call her back.

## 2018-02-26 NOTE — Telephone Encounter (Signed)
Referral placed.

## 2018-02-26 NOTE — Telephone Encounter (Signed)
Patient notified and would like referral to neurology

## 2018-02-26 NOTE — Addendum Note (Signed)
Addended by: Caryl Bis, Trace Wirick G on: 02/26/2018 02:12 PM   Modules accepted: Orders

## 2018-02-28 ENCOUNTER — Encounter: Payer: Self-pay | Admitting: Family Medicine

## 2018-02-28 DIAGNOSIS — J309 Allergic rhinitis, unspecified: Secondary | ICD-10-CM

## 2018-03-02 ENCOUNTER — Encounter: Payer: Self-pay | Admitting: Family Medicine

## 2018-03-06 ENCOUNTER — Telehealth: Payer: Self-pay

## 2018-03-06 ENCOUNTER — Ambulatory Visit (AMBULATORY_SURGERY_CENTER): Payer: Self-pay

## 2018-03-06 VITALS — Ht 61.0 in | Wt 159.4 lb

## 2018-03-06 DIAGNOSIS — Z1211 Encounter for screening for malignant neoplasm of colon: Secondary | ICD-10-CM

## 2018-03-06 MED ORDER — PEG 3350-KCL-NA BICARB-NACL 420 G PO SOLR: 4000.0000 mL | Freq: Once | ORAL | 0 refills | Status: AC

## 2018-03-06 NOTE — Progress Notes (Signed)
Per pt, no allergies to soy or egg products.Pt not taking any weight loss meds or using  O2 at home.  Pt refused emmi video. 

## 2018-03-06 NOTE — Telephone Encounter (Signed)
Dr Ardis Hughs, This pt is scheduled for her colon on 03/20/18. She was diagnosed with right breast cancer in 05/08/17 and went thru 24 rounds of radiation. The last radiation was finished in Jan 2019. She is taking Tamoxifen daily now.  Is pt ok to proceed with her colon at [facility] or does she need an OV? Please advise.  Thanks, Gwyndolyn Saxon in [facility]

## 2018-03-07 ENCOUNTER — Ambulatory Visit
Admission: RE | Admit: 2018-03-07 | Discharge: 2018-03-07 | Disposition: A | Discharge status: Home / Self Care | Payer: PPO | Source: Ambulatory Visit | Attending: Radiation Oncology | Admitting: Radiation Oncology

## 2018-03-07 ENCOUNTER — Other Ambulatory Visit: Payer: Self-pay

## 2018-03-07 VITALS — BP 147/54 | HR 61 | Temp 97.5°F | Resp 18 | Wt 159.8 lb

## 2018-03-07 DIAGNOSIS — C50511 Malignant neoplasm of lower-outer quadrant of right female breast: Secondary | ICD-10-CM

## 2018-03-07 DIAGNOSIS — D0511 Intraductal carcinoma in situ of right breast: Secondary | ICD-10-CM | POA: Diagnosis not present

## 2018-03-07 DIAGNOSIS — Z170 Estrogen receptor positive status [ER+]: Secondary | ICD-10-CM

## 2018-03-07 DIAGNOSIS — Z79810 Long term (current) use of selective estrogen receptor modulators (SERMs): Secondary | ICD-10-CM | POA: Diagnosis not present

## 2018-03-07 NOTE — Telephone Encounter (Signed)
OK to proceed with [place] colonoscopy.  Thanks for checking.

## 2018-03-07 NOTE — Progress Notes (Signed)
Radiation Oncology Follow up Note  Name: Gwendolyn King   Date:   03/07/2018 MRN:  [id number] DOB: [birth year]    This 77 y.o. 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 Tumor was ERpositive PR negative. She seen today in routine follow-up is doing well has not yet had a follow-up mammogram.she's currently on tamoxifen tolerating that well without side effect.  COMPLICATIONS OF TREATMENT: none  FOLLOW UP COMPLIANCE: keeps appointments   PHYSICAL EXAM:  BP (!) 147/54   Pulse 61   Temp (!) 97.5 F (36.4 C) (Tympanic)   Resp 18   Wt 159 lb 13.3 oz (72.5 kg)   BMI 30.20 kg/m  Lungs are clear to A&P cardiac examination essentially unremarkable with regular rate and rhythm. No dominant mass or nodularity is noted in either breast in 2 positions examined. Incision is well-healed. No axillary or supraclavicular adenopathy is appreciated. Cosmetic result is excellent. Well-developed well-nourished patient in NAD. HEENT reveals PERLA, EOMI, discs not visualized.  Oral cavity is clear. No oral mucosal lesions are identified. Neck is clear without evidence of cervical or supraclavicular adenopathy. Lungs are clear to A&P. Cardiac examination is essentially unremarkable with regular rate and rhythm without murmur rub or thrill. Abdomen is benign with no organomegaly or masses noted. Motor sensory and DTR levels are equal and symmetric in the upper and lower extremities. Cranial nerves II through XII are grossly intact. Proprioception is intact. No  peripheral adenopathy or edema is identified. No motor or sensory levels are noted. Crude visual fields are within normal range.  RADIOLOGY RESULTS: no current films for review  PLAN: at the present time patient is doing well with no evidence of disease. She continues to do well she'll be scheduled for follow-up mammograms and neck several months. She is currently on tamoxifen tolerating that well without side effect. I've asked to see her back in 6 months for follow-up and then will go to once your follow-up visits. Patient is to call with any concerns.  I would like to take this opportunity to thank you for allowing me to participate in the care of your patient.Noreene Filbert, MD

## 2018-03-07 NOTE — Progress Notes (Signed)
Patient denies any concerns today.  

## 2018-03-08 ENCOUNTER — Encounter: Payer: Self-pay | Admitting: Gastroenterology

## 2018-03-08 NOTE — Telephone Encounter (Signed)
[place] for colon at [facility] per Dr Ardis Hughs. Will proceed as scheduled. Gwyndolyn Saxon

## 2018-03-20 ENCOUNTER — Ambulatory Visit (AMBULATORY_SURGERY_CENTER): Payer: PPO | Admitting: Gastroenterology

## 2018-03-20 ENCOUNTER — Encounter: Payer: Self-pay | Admitting: Gastroenterology

## 2018-03-20 VITALS — BP 106/29 | HR 55 | Temp 97.3°F | Resp 14 | Ht 61.0 in | Wt 159.0 lb

## 2018-03-20 DIAGNOSIS — D124 Benign neoplasm of descending colon: Secondary | ICD-10-CM | POA: Diagnosis not present

## 2018-03-20 DIAGNOSIS — K5730 Diverticulosis of large intestine without perforation or abscess without bleeding: Secondary | ICD-10-CM

## 2018-03-20 DIAGNOSIS — Z1211 Encounter for screening for malignant neoplasm of colon: Secondary | ICD-10-CM

## 2018-03-20 DIAGNOSIS — I10 Essential (primary) hypertension: Secondary | ICD-10-CM | POA: Diagnosis not present

## 2018-03-20 MED ORDER — SODIUM CHLORIDE 0.9 % IV SOLN: 500.0000 mL | Freq: Once | INTRAVENOUS | Status: DC

## 2018-03-20 NOTE — Progress Notes (Signed)
Called to room to assist during endoscopic procedure.  Patient ID and intended procedure confirmed with present staff. Received instructions for my participation in the procedure from the performing physician.  

## 2018-03-20 NOTE — Progress Notes (Signed)
Report to PACU, RN, vss, BBS= Clear.  

## 2018-03-20 NOTE — Patient Instructions (Signed)
  Thank you for allowing [place] to care for you today!  Pathology results by mail in 2-3 weeks.  Handout given for polyps and diverticulosis.     YOU HAD AN ENDOSCOPIC PROCEDURE TODAY AT [facility]:   Refer to the procedure report that was given to you for any specific questions about what was found during the examination.  If the procedure report does not answer your questions, please call your gastroenterologist to clarify.  If you requested that your care partner not be given the details of your procedure findings, then the procedure report has been included in a sealed envelope for you to review at your convenience later.  YOU SHOULD EXPECT: Some feelings of bloating in the abdomen. Passage of more gas than usual.  Walking can help get rid of the air that was put into your GI tract during the procedure and reduce the bloating. If you had a lower endoscopy (such as a colonoscopy or flexible sigmoidoscopy) you may notice spotting of blood in your stool or on the toilet paper. If you underwent a bowel prep for your procedure, you may not have a normal bowel movement for a few days.  Please Note:  You might notice some irritation and congestion in your nose or some drainage.  This is from the oxygen used during your procedure.  There is no need for concern and it should clear up in a day or so.  SYMPTOMS TO REPORT IMMEDIATELY:   Following lower endoscopy (colonoscopy or flexible sigmoidoscopy):  Excessive amounts of blood in the stool  Significant tenderness or worsening of abdominal pains  Swelling of the abdomen that is new, acute  Fever of 100F or higher   For urgent or emergent issues, a gastroenterologist can be reached at any hour by calling [phone].   DIET:  We do recommend a small meal at first, but then you may proceed to your regular diet.  Drink plenty of fluids but you should avoid alcoholic beverages for 24 hours.  ACTIVITY:  You should plan to take it  easy for the rest of today and you should NOT DRIVE or use heavy machinery until tomorrow (because of the sedation medicines used during the test).    FOLLOW UP: Our staff will call the number listed on your records the next business day following your procedure to check on you and address any questions or concerns that you may have regarding the information given to you following your procedure. If we do not reach you, we will leave a message.  However, if you are feeling well and you are not experiencing any problems, there is no need to return our call.  We will assume that you have returned to your regular daily activities without incident.  If any biopsies were taken you will be contacted by phone or by letter within the next 1-3 weeks.  Please call us at [phone] if you have not heard about the biopsies in 3 weeks.    SIGNATURES/CONFIDENTIALITY: You and/or your care partner have signed paperwork which will be entered into your electronic medical record.  These signatures attest to the fact that that the information above on your After Visit Summary has been reviewed and is understood.  Full responsibility of the confidentiality of this discharge information lies with you and/or your care-partner.

## 2018-03-20 NOTE — Op Note (Signed)
[facility] Patient Name: Gwendolyn King Procedure Date: 03/20/2018 8:52 AM MRN: [id number] Endoscopist: Milus Banister , MD Age: 77 Referring MD:  Date of Birth: 05/22/1941 Gender: Female Account #: [id number] Procedure:                Colonoscopy Indications:              Screening for colorectal malignant neoplasm Medicines:                Monitored Anesthesia Care Procedure:                Pre-Anesthesia Assessment:                           - Prior to the procedure, a History and Physical                            was performed, and patient medications and                            allergies were reviewed. The patient's tolerance of                            previous anesthesia was also reviewed. The risks                            and benefits of the procedure and the sedation                            options and risks were discussed with the patient.                            All questions were answered, and informed consent                            was obtained. Prior Anticoagulants: The patient has                            taken no previous anticoagulant or antiplatelet                            agents. ASA Grade Assessment: II - A patient with                            mild systemic disease. After reviewing the risks                            and benefits, the patient was deemed in                            satisfactory condition to undergo the procedure.                           After obtaining informed consent, the colonoscope  was passed under direct vision. Throughout the                            procedure, the patient's blood pressure, pulse, and                            oxygen saturations were monitored continuously. The                            Colonoscope was introduced through the anus and                            advanced to the the cecum, identified by                            appendiceal orifice and  ileocecal valve. The                            colonoscopy was performed without difficulty. The                            patient tolerated the procedure well. The quality                            of the bowel preparation was good. The ileocecal                            valve, appendiceal orifice, and rectum were                            photographed. Scope In: 9:03:32 AM Scope Out: 9:18:19 AM Scope Withdrawal Time: 0 hours 10 minutes 36 seconds  Total Procedure Duration: 0 hours 14 minutes 47 seconds  Findings:                 Two sessile polyps were found in the proximal                            descending colon. The polyps were 4 to 5 mm in                            size. These polyps were removed with a cold snare.                            Resection and retrieval were complete.                           Diverticulosis in the left colon.                           The exam was otherwise without abnormality on                            direct and retroflexion views. Complications:  No immediate complications. Estimated blood loss:                            None. Estimated Blood Loss:     Estimated blood loss: none. Impression:               - Two 4 to 5 mm polyps in the proximal descending                            colon, removed with a cold snare. Resected and                            retrieved.                           - Diverticulosis.                           - The examination was otherwise normal on direct                            and retroflexion views. Recommendation:           - Patient has a contact number available for                            emergencies. The signs and symptoms of potential                            delayed complications were discussed with the                            patient. Return to normal activities tomorrow.                            Written discharge instructions were provided to the                             patient.                           - Resume previous diet.                           - Continue present medications.                           You will receive a letter within 2-3 weeks with the                            pathology results and my final recommendations.                           If the polyp(s) is proven to be 'pre-cancerous' on                            pathology,  you will need repeat colonoscopy in 5                            years. Milus Banister, MD 03/20/2018 9:20:24 AM This report has been signed electronically.

## 2018-03-20 NOTE — Progress Notes (Signed)
Pt's states no medical or surgical changes since previsit or office visit. 

## 2018-03-21 ENCOUNTER — Telehealth: Payer: Self-pay

## 2018-03-21 NOTE — Telephone Encounter (Signed)
  Follow up Call-  Call back number 03/20/2018  Post procedure Call Back phone  # [phone]  Permission to leave phone message Yes  Some recent data might be hidden     Patient questions:  Do you have a fever, pain , or abdominal swelling? No. Pain Score  0 *  Have you tolerated food without any problems? Yes.    Have you been able to return to your normal activities? Yes.    Do you have any questions about your discharge instructions: Diet   No. Medications  No. Follow up visit  No.  Do you have questions or concerns about your Care? No.  Actions: * If pain score is 4 or above: No action needed, pain <4.

## 2018-03-26 ENCOUNTER — Encounter: Payer: Self-pay | Admitting: Gastroenterology

## 2018-03-26 ENCOUNTER — Encounter (INDEPENDENT_AMBULATORY_CARE_PROVIDER_SITE_OTHER): Payer: Self-pay

## 2018-04-17 DIAGNOSIS — H9191 Unspecified hearing loss, right ear: Secondary | ICD-10-CM | POA: Diagnosis not present

## 2018-04-18 ENCOUNTER — Ambulatory Visit (INDEPENDENT_AMBULATORY_CARE_PROVIDER_SITE_OTHER): Payer: PPO

## 2018-04-18 VITALS — BP 122/62 | HR 61 | Temp 98.5°F | Resp 15 | Ht 60.5 in | Wt 157.0 lb

## 2018-04-18 DIAGNOSIS — Z0000 Encounter for general adult medical examination without abnormal findings: Secondary | ICD-10-CM | POA: Diagnosis not present

## 2018-04-18 NOTE — Patient Instructions (Addendum)
  Gwendolyn King , Thank you for taking time to come for your Medicare Wellness Visit. I appreciate your ongoing commitment to your health goals. Please review the following plan we discussed and let me know if I can assist you in the future.   Follow up as needed.    Bring a copy of your [place] and/or Living Will to be scanned into chart upon completion.   Keep all routine maintenance appointments.   Have a great day!  These are the goals we discussed: Goals    . DIET - INCREASE WATER INTAKE    . Increase physical activity       This is a list of the screening recommended for you and due dates:  Health Maintenance  Topic Date Due  . Flu Shot  03/21/2018  . Tetanus Vaccine  08/21/2020  . Colon Cancer Screening  03/21/2023  . DEXA scan (bone density measurement)  Completed  . Pneumonia vaccines  Completed

## 2018-04-18 NOTE — Progress Notes (Signed)
Subjective:   Gwendolyn King is a 77 y.o. female who presents for Medicare Annual (Subsequent) preventive examination.  Review of Systems:  No ROS.  Medicare Wellness Visit. Additional risk factors are reflected in the social history.  Cardiac Risk Factors include: advanced age (>58mn, >>71women);hypertension;obesity (BMI >30kg/m2)     Objective:     Vitals: BP 122/62 (BP Location: Left Arm, Patient Position: Sitting, Cuff Size: Normal)   Pulse 61   Temp 98.5 F (36.9 C) (Oral)   Resp 15   Ht 5' 0.5" (1.537 m)   Wt 157 lb (71.2 kg)   SpO2 98%   BMI 30.16 kg/m   Body mass index is 30.16 kg/m.  Advanced Directives 04/18/2018 09/27/2017 08/23/2017 08/23/2017 06/07/2017 06/01/2017 10/17/2016  Does Patient Have a Medical Advance Directive? _0  No No  Would patient like information on creating a medical advance directive? No - Patient declined No - Patient declined - - No - Patient declined No - Patient declined No - Patient declined    Tobacco Social History   Tobacco Use  Smoking Status Never Smoker  Smokeless Tobacco Never Used     Counseling given: Not Answered   Clinical Intake:  Pre-visit preparation completed: Yes  Pain : No/denies pain     Nutritional Status: BMI > 30  Obese Diabetes: No  How often do you need to have someone help you when you read instructions, pamphlets, or other written materials from your doctor or pharmacy?: 1 - Never  Interpreter Needed?: No     Past Medical History:  Diagnosis Date  . Allergy    hay fever, fall allergies  . Arthritis   . Asthma    as a child, dad was smoker  . 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  . Cataract    Bil  . Chicken pox   . Colon polyp   . Constipation   . Hyperlipidemia   . Hypothyroidism   . Palpitations    over 20 yeras ago  . Thyroid disease    Past Surgical History:  Procedure  Laterality Date  . ABDOMINAL HYSTERECTOMY  1981  . APPENDECTOMY  2012  . 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 BREAST LUMPECTOMY Right 06/07/2017   DUCTAL CARCINOMA IN SITU (DCIS)/24 radiation treatments  . BREAST LUMPECTOMY Right 06/07/2017   PAPILLARY LESION CONSISTENT WITH PAPILLARY CARCINOMA  . BREAST LUMPECTOMY WITH SENTINEL LYMPH NODE BIOPSY Right 06/07/2017   Wide excision of 2 foci of high-grade DCIS, microinvasion noted only on core biopsy. 2 microscopic foci lateral inferior margins. Patient elected not to proceed to reexcision.  . COLONOSCOPY    . COLONOSCOPY W/ POLYPECTOMY    . FINGER FRACTURE SURGERY Left    5th  . HAMMER TOE SURGERY  09/20/2017  . JOINT REPLACEMENT     right knee  . ROTATOR CUFF REPAIR Right   . TONSILLECTOMY AND ADENOIDECTOMY  1963  . TOTAL KNEE ARTHROPLASTY Right 10/17/2016   Procedure: TOTAL KNEE ARTHROPLASTY;  Surgeon: PMelrose Nakayama MD;  [facility] [facility]  Service: Orthopedics;  Laterality: Right;  .Marland KitchenVAGINAL DELIVERY     3   Family History  Problem Relation Age of Onset  . Arthritis Mother   . Heart disease Mother   . Hypertension Mother   . Arthritis Father   .  Heart disease Father   . Hypertension Father   . Heart attack Father   . Stroke Maternal Grandmother   . Cancer Maternal Aunt        abdominal?  . Hyperlipidemia Maternal Aunt   . Diabetes Paternal Aunt   . Glaucoma Brother   . Hyperlipidemia Other   . Diabetes Other   . Breast cancer Neg Hx    Social History   Socioeconomic History  . Marital status: Married    Spouse name: Not on file  . Number of children: Not on file  . Years of education: Not on file  . Highest education level: Not on file  Occupational History  . Not on file  Social Needs  . Financial resource strain: Not hard at all  . Food insecurity:    Worry: Never true    Inability: Never true  . Transportation needs:     Medical: No    Non-medical: No  Tobacco Use  . Smoking status: Never Smoker  . Smokeless tobacco: Never Used  Substance and Sexual Activity  . Alcohol use: No  . Drug use: No  . Sexual activity: Not on file  Lifestyle  . Physical activity:    Days per week: 4 days    Minutes per session: 60 min  . Stress: Not at all  Relationships  . Social connections:    Talks on phone: Not on file    Gets together: Not on file    Attends religious service: Not on file    Active member of club or organization: Not on file    Attends meetings of clubs or organizations: Not on file    Relationship status: Not on file  Other Topics Concern  . Not on file  Social History Narrative   Lives with husband in [place]. 3 children.      Work - retired, Pharmacist, community, Pharmacist, hospital      Diet - regular, limits sugar, eats small meals   Exercise - Jazzercise 4 days per week    Outpatient Encounter Medications as of 04/18/2018  Medication Sig  . acebutolol (SECTRAL) 200 MG capsule Take 1 capsule (200 mg total) by mouth daily.  Marland Kitchen acetaminophen (TYLENOL) 500 MG tablet Take 1,000 mg by mouth at bedtime.  Marland Kitchen aspirin EC 81 MG tablet Take 81 mg by mouth daily.  . Biotin (BIOTIN 5000) 5 MG CAPS Take 5 mg by mouth daily.   . cetirizine (ZYRTEC) 10 MG tablet Take 10 mg by mouth daily.  . Cholecalciferol (VITAMIN D-3) 1000 UNITS CAPS Take 1,000 Units by mouth every evening.   . furosemide (LASIX) 20 MG tablet Take 1 tablet (20 mg total) by mouth daily. (Patient taking differently: Take 20 mg by mouth as needed. )  . KRILL OIL PO Take 1 capsule by mouth daily.   Marland Kitchen LEVOXYL 75 MCG tablet TAKE 1 TABLET ONE TIME DAILY  . polyethylene glycol powder (GLYCOLAX/MIRALAX) powder Take 1 Container by mouth once.  . tamoxifen (NOLVADEX) 20 MG tablet Take 1 tablet (20 mg total) by mouth daily.  . [DISCONTINUED] fluticasone (FLONASE) 50 MCG/ACT nasal spray Place 2 sprays into both nostrils daily.  . [DISCONTINUED] polyethylene  glycol (MIRALAX / GLYCOLAX) packet Take 17 g by mouth daily.  . [DISCONTINUED] PROAIR HFA 108 (90 Base) MCG/ACT inhaler    No facility-administered encounter medications on file as of 04/18/2018.     Activities of Daily Living In your present state of health, do you have any difficulty  performing the following activities: 04/18/2018 06/01/2017  Hearing? Y N  Comment Hearing aid, bilateral -  Vision? N N  Difficulty concentrating or making decisions? N N  Walking or climbing stairs? N N  Dressing or bathing? N N  Doing errands, shopping? N N  Preparing Food and eating ? N -  Using the Toilet? N -  In the past six months, have you accidently leaked urine? N -  Do you have problems with loss of bowel control? N -  Managing your Medications? N -  Managing your Finances? N -  Housekeeping or managing your Housekeeping? N -  Some recent data might be hidden    Patient Care Team: Leone Haven, MD as PCP - General (Family Medicine) Minna Merritts, MD as Consulting Physician (Cardiology) Bary Castilla Forest Gleason, MD (General Surgery)    Assessment:   This is a routine wellness examination for Lorianna.  The goal of the wellness visit is to assist the patient how to close the gaps in care and create a preventative care plan for the patient.   The roster of all physicians providing medical care to patient is listed in the Snapshot section of the chart.  Taking calcium VIT D as appropriate/Osteoporosis risk reviewed.    Safety issues reviewed; Smoke and carbon monoxide detectors in the home. No firearms in the home. Wears seatbelts when driving or riding with others. No violence in the home.  They do not have excessive sun exposure.  Discussed the need for sun protection: hats, long sleeves and the use of sunscreen if there is significant sun exposure.  Patient is alert, normal appearance, oriented to person/place/and time.  Correctly identified the president of the [place] and recalls  of 3/3 words. Performs simple calculations and can read correct time from watch face.  Displays appropriate judgement.  No new identified risk were noted.  No failures at ADL's or IADL's.    BMI- discussed the importance of a healthy diet, water intake and the benefits of aerobic exercise. Educational material provided.   24 hour diet recall: Regular diet  Dental- every 6 months. Dr. Delfino Lovett Daily  Sleep patterns- Sleeps 6-7 hours at night.  Wakes feeling rested. Naps during the day as needed.   Influenza vaccine discussed.  She plans to receive later in the season.   Patient Concerns: R ear still feels full/closed.  Previously followed up with physicians as directed. Scheduled to see allergist next month.   Exercise Activities and Dietary recommendations Current Exercise Habits: Structured exercise class, Type of exercise: strength training/weights, Time (Minutes): 60, Frequency (Times/Week): 4, Weekly Exercise (Minutes/Week): 240, Intensity: Moderate  Goals    . DIET - INCREASE WATER INTAKE    . Increase physical activity       Fall Risk Fall Risk  04/18/2018 07/02/2017 03/23/2016 10/14/2014 08/26/2013  Falls in the past year? No No No No Yes  Number falls in past yr: - - - - 1  Injury with Fall? - - - - Yes    Depression Screen PHQ 2/9 Scores 04/18/2018 07/02/2017 03/23/2016 10/14/2014  PHQ - 2 Score 0 0 0 0     Cognitive Function MMSE - Mini Mental State Exam 04/18/2018  Orientation to time 5  Orientation to Place 5  Registration 3  Attention/ Calculation 5  Recall 3  Language- name 2 objects 2  Language- repeat 1  Language- follow 3 step command 3  Language- read & follow direction 1  Write a sentence 1  Copy design 1  Total score 30        Immunization History  Administered Date(s) Administered  . Influenza Split 05/01/2012  . Influenza, High Dose Seasonal PF 05/22/2016, 05/11/2017  . Influenza,inj,Quad PF,6+ Mos 05/18/2014  . Influenza-Unspecified  05/23/2013  . Pneumococcal Conjugate-13 08/21/2010  . Pneumococcal Polysaccharide-23 04/10/2014  . Tdap 08/21/2010  . Zoster 07/05/2008   Screening Tests Health Maintenance  Topic Date Due  . INFLUENZA VACCINE  03/21/2018  . TETANUS/TDAP  08/21/2020  . COLONOSCOPY  03/21/2023  . DEXA SCAN  Completed  . PNA vac Low Risk Adult  Completed      Plan:   End of life planning; Advance aging; Advanced directives discussed. Copy of current HCPOA/Living Will requested upon completion.    Keep all routine maintenance appointments. Follow as directed.   I have personally reviewed and noted the following in the patient's chart:   . Medical and social history . Use of alcohol, tobacco or illicit drugs  . Current medications and supplements . Functional ability and status . Nutritional status . Physical activity . Advanced directives . List of other physicians . Hospitalizations, surgeries, and ER visits in previous 12 months . Vitals . Screenings to include cognitive, depression, and falls . Referrals and appointments  In addition, I have reviewed and discussed with patient certain preventive protocols, quality metrics, and best practice recommendations. A written personalized care plan for preventive services as well as general preventive health recommendations were provided to patient.     Varney Biles, LPN  [id number]

## 2018-04-19 NOTE — Progress Notes (Signed)
Note reviewed, agree with LPN

## 2018-04-25 ENCOUNTER — Encounter: Payer: Self-pay | Admitting: Family Medicine

## 2018-04-25 DIAGNOSIS — Z5181 Encounter for therapeutic drug level monitoring: Secondary | ICD-10-CM

## 2018-04-29 ENCOUNTER — Encounter: Payer: Self-pay | Admitting: Family

## 2018-04-29 ENCOUNTER — Other Ambulatory Visit: Payer: Self-pay

## 2018-04-29 DIAGNOSIS — D0511 Intraductal carcinoma in situ of right breast: Secondary | ICD-10-CM

## 2018-04-30 ENCOUNTER — Other Ambulatory Visit: Payer: Self-pay

## 2018-04-30 DIAGNOSIS — D0511 Intraductal carcinoma in situ of right breast: Secondary | ICD-10-CM

## 2018-04-30 DIAGNOSIS — Z780 Asymptomatic menopausal state: Secondary | ICD-10-CM

## 2018-05-09 DIAGNOSIS — J3081 Allergic rhinitis due to animal (cat) (dog) hair and dander: Secondary | ICD-10-CM | POA: Diagnosis not present

## 2018-05-09 DIAGNOSIS — J301 Allergic rhinitis due to pollen: Secondary | ICD-10-CM | POA: Diagnosis not present

## 2018-05-09 DIAGNOSIS — J3089 Other allergic rhinitis: Secondary | ICD-10-CM | POA: Diagnosis not present

## 2018-05-09 DIAGNOSIS — R05 Cough: Secondary | ICD-10-CM | POA: Diagnosis not present

## 2018-05-10 ENCOUNTER — Encounter: Payer: Self-pay | Admitting: Family Medicine

## 2018-05-14 DIAGNOSIS — J3089 Other allergic rhinitis: Secondary | ICD-10-CM | POA: Diagnosis not present

## 2018-05-14 DIAGNOSIS — J3081 Allergic rhinitis due to animal (cat) (dog) hair and dander: Secondary | ICD-10-CM | POA: Diagnosis not present

## 2018-05-14 DIAGNOSIS — J301 Allergic rhinitis due to pollen: Secondary | ICD-10-CM | POA: Diagnosis not present

## 2018-05-22 ENCOUNTER — Ambulatory Visit (INDEPENDENT_AMBULATORY_CARE_PROVIDER_SITE_OTHER): Payer: PPO | Admitting: *Deleted

## 2018-05-22 ENCOUNTER — Other Ambulatory Visit (INDEPENDENT_AMBULATORY_CARE_PROVIDER_SITE_OTHER): Payer: PPO

## 2018-05-22 ENCOUNTER — Encounter: Payer: Self-pay | Admitting: Family Medicine

## 2018-05-22 DIAGNOSIS — N183 Chronic kidney disease, stage 3 unspecified: Secondary | ICD-10-CM | POA: Insufficient documentation

## 2018-05-22 DIAGNOSIS — Z5181 Encounter for therapeutic drug level monitoring: Secondary | ICD-10-CM

## 2018-05-22 DIAGNOSIS — Z23 Encounter for immunization: Secondary | ICD-10-CM | POA: Diagnosis not present

## 2018-05-22 LAB — BASIC METABOLIC PANEL WITH GFR
BUN: 20 mg/dL (ref 6–23)
CO2: 29 meq/L (ref 19–32)
Calcium: 9.8 mg/dL (ref 8.4–10.5)
Chloride: 104 meq/L (ref 96–112)
Creatinine, Ser: 1.28 mg/dL — ABNORMAL HIGH (ref 0.40–1.20)
GFR: 43.17 mL/min — ABNORMAL LOW
Glucose, Bld: 125 mg/dL — ABNORMAL HIGH (ref 70–99)
Potassium: 3.8 meq/L (ref 3.5–5.1)
Sodium: 141 meq/L (ref 135–145)

## 2018-05-23 DIAGNOSIS — J3081 Allergic rhinitis due to animal (cat) (dog) hair and dander: Secondary | ICD-10-CM | POA: Diagnosis not present

## 2018-05-23 DIAGNOSIS — J3089 Other allergic rhinitis: Secondary | ICD-10-CM | POA: Diagnosis not present

## 2018-05-23 DIAGNOSIS — J301 Allergic rhinitis due to pollen: Secondary | ICD-10-CM | POA: Diagnosis not present

## 2018-05-24 ENCOUNTER — Other Ambulatory Visit: Payer: Self-pay | Admitting: Family Medicine

## 2018-05-24 ENCOUNTER — Telehealth: Payer: Self-pay | Admitting: Family Medicine

## 2018-05-24 DIAGNOSIS — N183 Chronic kidney disease, stage 3 unspecified: Secondary | ICD-10-CM

## 2018-05-24 NOTE — Telephone Encounter (Signed)
rec'd call from pt. to review recent lab results; see result note.  Stated she is taking HCTZ-Triamterene 25 mg. daily.  She questioned if she should be taking it every other day, since her kidney function is decreased?  Advised will send note to Dr. Caryl Bis for recommendation.

## 2018-05-24 NOTE — Telephone Encounter (Signed)
Please advise 

## 2018-05-27 NOTE — Telephone Encounter (Signed)
Sent to PCP to advise 

## 2018-05-28 DIAGNOSIS — J301 Allergic rhinitis due to pollen: Secondary | ICD-10-CM | POA: Diagnosis not present

## 2018-05-28 DIAGNOSIS — J3089 Other allergic rhinitis: Secondary | ICD-10-CM | POA: Diagnosis not present

## 2018-05-28 DIAGNOSIS — J3081 Allergic rhinitis due to animal (cat) (dog) hair and dander: Secondary | ICD-10-CM | POA: Diagnosis not present

## 2018-05-28 NOTE — Telephone Encounter (Signed)
Please confirm with her that she is taking this for mastoid fluid on the right.  If so we could try having her take it every other day.

## 2018-05-30 ENCOUNTER — Encounter: Payer: Self-pay | Admitting: Family Medicine

## 2018-05-30 DIAGNOSIS — J3089 Other allergic rhinitis: Secondary | ICD-10-CM | POA: Diagnosis not present

## 2018-05-30 DIAGNOSIS — J3081 Allergic rhinitis due to animal (cat) (dog) hair and dander: Secondary | ICD-10-CM | POA: Diagnosis not present

## 2018-05-30 DIAGNOSIS — J301 Allergic rhinitis due to pollen: Secondary | ICD-10-CM | POA: Diagnosis not present

## 2018-05-30 NOTE — Telephone Encounter (Signed)
Patient called in wanting to know more info about the referral that was placed on 10/4. Also she is still wanting to speak to shelby who gave her a call a little while ago.

## 2018-05-30 NOTE — Telephone Encounter (Signed)
Patient called in to return shelby's call regarding medication questions. Says she can be reached after lunch.

## 2018-05-30 NOTE — Telephone Encounter (Signed)
Called pt and left a VM to call back.  

## 2018-05-30 NOTE — Telephone Encounter (Signed)
Called and spoke with patient. Pt stated that yes she is taking the HCTZ-Triamterene bc he other doctor told her to stop lasix and put her on this for the mastoid fluid on the right.   Pt advised to take medication every other day.   Sent to PCP as an [place]

## 2018-06-01 ENCOUNTER — Encounter: Payer: Self-pay | Admitting: Family Medicine

## 2018-06-02 NOTE — Progress Notes (Signed)
[facility]  [phone]) [phone] [phone]) [phone]  ID: Gwendolyn King OB: [birth year]  MR#: [id number]  WER#:[id number]  Patient Care Team: Leone Haven, MD as PCP - General (Family Medicine) Minna Merritts, MD as Consulting Physician (Cardiology) Bary Castilla, Forest Gleason, MD (General Surgery)  CHIEF COMPLAINT: DCIS, right breast.  INTERVAL HISTORY: Patient returns to clinic today for routine six-month evaluation.  She feels her hot flashes have become worse over the past several months and occasionally disrupt her day-to-day activity.  She otherwise feels well.  She has no neurologic complaints.  She denies any recent fevers or illnesses.  She has a good appetite and denies weight loss. She denies any chest pain or shortness of breath.  She denies any nausea, vomiting, constipation, or diarrhea.  She has no urinary complaints.  Patient offers no further specific complaints today.  REVIEW OF SYSTEMS:   Review of Systems  Constitutional: Positive for diaphoresis. Negative for fever, malaise/fatigue and weight loss.  Respiratory: Negative.  Negative for cough and shortness of breath.   Cardiovascular: Negative.  Negative for chest pain and leg swelling.  Gastrointestinal: Negative.  Negative for abdominal pain and constipation.  Genitourinary: Negative.  Negative for dysuria.  Musculoskeletal: Negative.  Negative for back pain.  Skin: Negative.  Negative for rash.  Neurological: Positive for sensory change. Negative for focal weakness and weakness.  Psychiatric/Behavioral: Negative.  The patient is not nervous/anxious.     As per HPI. Otherwise, a complete review of systems is negative.  PAST MEDICAL HISTORY: Past Medical History:  Diagnosis Date  . Allergy    hay fever, fall allergies  . Arthritis   . Asthma    as a child, dad was smoker  . 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  . Cataract    Bil  . Chicken pox   . Colon polyp   . Constipation   . Hyperlipidemia   . Hypothyroidism   . Palpitations    over 20 yeras ago  . Thyroid disease     PAST SURGICAL HISTORY: Past Surgical History:  Procedure Laterality Date  . ABDOMINAL HYSTERECTOMY  1981  . APPENDECTOMY  2012  . 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 BREAST LUMPECTOMY Right 06/07/2017   DUCTAL CARCINOMA IN SITU (DCIS)/24 radiation treatments  . BREAST LUMPECTOMY Right 06/07/2017   PAPILLARY LESION CONSISTENT WITH PAPILLARY CARCINOMA  . BREAST LUMPECTOMY WITH SENTINEL LYMPH NODE BIOPSY Right 06/07/2017   Wide excision of 2 foci of high-grade DCIS, microinvasion noted only on core biopsy. 2 microscopic foci lateral inferior margins. Patient elected not to proceed to reexcision.  . COLONOSCOPY    . COLONOSCOPY W/ POLYPECTOMY    . FINGER FRACTURE SURGERY Left    5th  . HAMMER TOE SURGERY  09/20/2017  . JOINT REPLACEMENT     right knee  . ROTATOR CUFF REPAIR Right   . TONSILLECTOMY AND ADENOIDECTOMY  1963  . TOTAL KNEE ARTHROPLASTY Right 10/17/2016   Procedure: TOTAL KNEE ARTHROPLASTY;  Surgeon: Melrose Nakayama, MD;  Location: [facility];  Service: Orthopedics;  Laterality: Right;  Marland Kitchen VAGINAL DELIVERY     3    FAMILY HISTORY: Family History  Problem Relation Age of Onset  . Arthritis Mother   . Heart disease Mother   . Hypertension Mother   . Arthritis Father   .  Heart disease Father   . Hypertension Father   . Heart attack Father   . Stroke Maternal Grandmother   . Cancer Maternal Aunt        abdominal?  . Hyperlipidemia Maternal Aunt   . Diabetes Paternal Aunt   . Glaucoma Brother   . Hyperlipidemia Other   . Diabetes Other   . Breast cancer Neg Hx     ADVANCED DIRECTIVES (Y/N):  N  HEALTH MAINTENANCE: Social History   Tobacco Use  . Smoking status: Never Smoker  . Smokeless tobacco:  Never Used  Substance Use Topics  . Alcohol use: No  . Drug use: No     Colonoscopy:  PAP:  Bone density:  Lipid panel:  Allergies  Allergen Reactions  . Lipitor [Atorvastatin] Other (See Comments)    Myalgia, arthralgia  . Simvastatin Other (See Comments)    myalgia    Current Outpatient Medications  Medication Sig Dispense Refill  . acebutolol (SECTRAL) 200 MG capsule Take 1 capsule (200 mg total) by mouth daily. 90 capsule 3  . acetaminophen (TYLENOL) 500 MG tablet Take 1,000 mg by mouth at bedtime.    Marland Kitchen aspirin EC 81 MG tablet Take 81 mg by mouth daily.    Marland Kitchen azelastine (OPTIVAR) 0.05 % ophthalmic solution INSTILL 1 DROP INTO AFFECTED EYE TWICE DAILY FOR 30 DAYS  3  . Biotin (BIOTIN 5000) 5 MG CAPS Take 5 mg by mouth daily.     . Cholecalciferol (VITAMIN D-3) 1000 UNITS CAPS Take 1,000 Units by mouth every evening.     . fluticasone (FLONASE) 50 MCG/ACT nasal spray USE 1 TO 2 SPRAY(S) IN EACH NOSTRIL ONCE DAILY FOR 30 DAYS  3  . KRILL OIL PO Take 1 capsule by mouth daily.     Marland Kitchen levocetirizine (XYZAL) 5 MG tablet TAKE 1 TABLET BY MOUTH ONCE DAILY IN THE EVENING FOR 30 DAYS  3  . LEVOXYL 75 MCG tablet TAKE 1 TABLET ONE TIME DAILY 90 tablet 3  . montelukast (SINGULAIR) 10 MG tablet TAKE 1 TABLET BY MOUTH ONCE DAILY FOR 30 DAYS  3  . polyethylene glycol powder (GLYCOLAX/MIRALAX) powder Take 1 Container by mouth once.    . triamterene-hydrochlorothiazide (DYAZIDE) 37.5-25 MG capsule TAKE 1 CAPSULE BY MOUTH ONCE DAILY IN THE MORNING  3  . letrozole (FEMARA) 2.5 MG tablet Take 1 tablet (2.5 mg total) by mouth daily. 90 tablet 3   No current facility-administered medications for this visit.     OBJECTIVE: Vitals:   06/04/18 1431  BP: (!) 116/58  Pulse: 67  Resp: 16  Temp: (!) 96.5 F (35.8 C)     Body mass index is 30.57 kg/m.    ECOG FS:0 - Asymptomatic  General: Well-developed, well-nourished, no acute distress. Eyes: Pink conjunctiva, anicteric sclera. HEENT:  Normocephalic, moist mucous membranes. Breast: Patient declined breast exam today. Lungs: Clear to auscultation bilaterally. Heart: Regular rate and rhythm. No rubs, murmurs, or gallops. Abdomen: Soft, nontender, nondistended. No organomegaly noted, normoactive bowel sounds. Musculoskeletal: No edema, cyanosis, or clubbing. Neuro: Alert, answering all questions appropriately. Cranial nerves grossly intact. Skin: No rashes or petechiae noted. Psych: Normal affect.   LAB RESULTS:  Lab Results  Component Value Date   NA 141 05/22/2018   K 3.8 05/22/2018   CL 104 05/22/2018   CO2 29 05/22/2018   GLUCOSE 125 (H) 05/22/2018   BUN 20 05/22/2018   CREATININE 1.28 (H) 05/22/2018   CALCIUM 9.8 05/22/2018   PROT 6.9 06/01/2017  ALBUMIN 4.4 06/01/2017   AST 20 06/01/2017   ALT 16 06/01/2017   ALKPHOS 52 06/01/2017   BILITOT 1.2 06/01/2017   GFRNONAA 42 (L) 06/01/2017   GFRAA 49 (L) 06/01/2017    Lab Results  Component Value Date   WBC 4.8 08/15/2017   NEUTROABS 2.6 06/01/2017   HGB 14.2 08/15/2017   HCT 41.9 08/15/2017   MCV 87.0 08/15/2017   PLT 94 (L) 08/15/2017     STUDIES: No results found.  ASSESSMENT: DCIS, right breast  PLAN:    1. DCIS, right breast: Patient underwent lumpectomy on June 07, 2017.  Final pathology was reviewed and no invasive component was noted, there patient did not require chemotherapy.  She completed adjuvant XRT in January 2019.  Because of patient's persistent hot flashes have discontinued tamoxifen for 1 month and patient will then initiate letrozole.  Patient expressed understanding that although tamoxifen is the recommended treatment, there is still significant benefit with aromatase inhibitors.  She will continue treatment for a total of 5 years completing in January 2024.  She has a mammogram scheduled for June 06, 2018. Will also get a baseline bone marrow density in the next 1 to 2 weeks.  Return to clinic in 6 months for routine  evaluation.   2.  Thrombocytopenia: Mild, monitor.   3.  Hot flashes: Discontinue tamoxifen as above.    Patient expressed understanding and was in agreement with this plan. She also understands that She can call clinic at any time with any questions, concerns, or complaints.   Cancer Staging Ductal carcinoma in situ (DCIS) of right breast Staging form: Breast, AJCC 8th Edition - Clinical stage from 08/22/2017: Stage 0 (cTis (DCIS), cN0, cM0, ER: Positive, PR: Negative, HER2: Negative) - Signed by Lloyd Huger, MD on 08/22/2017   Lloyd Huger, MD   06/05/2018 10:34 PM

## 2018-06-04 ENCOUNTER — Inpatient Hospital Stay: Payer: PPO | Attending: Oncology | Admitting: Oncology

## 2018-06-04 ENCOUNTER — Encounter: Payer: Self-pay | Admitting: Oncology

## 2018-06-04 ENCOUNTER — Other Ambulatory Visit (INDEPENDENT_AMBULATORY_CARE_PROVIDER_SITE_OTHER): Payer: PPO

## 2018-06-04 ENCOUNTER — Other Ambulatory Visit: Payer: Self-pay

## 2018-06-04 VITALS — BP 116/58 | HR 67 | Temp 96.5°F | Resp 16 | Wt 159.1 lb

## 2018-06-04 DIAGNOSIS — N183 Chronic kidney disease, stage 3 unspecified: Secondary | ICD-10-CM

## 2018-06-04 DIAGNOSIS — D0511 Intraductal carcinoma in situ of right breast: Secondary | ICD-10-CM | POA: Insufficient documentation

## 2018-06-04 DIAGNOSIS — N951 Menopausal and female climacteric states: Secondary | ICD-10-CM | POA: Diagnosis not present

## 2018-06-04 DIAGNOSIS — Z79811 Long term (current) use of aromatase inhibitors: Secondary | ICD-10-CM | POA: Diagnosis not present

## 2018-06-04 DIAGNOSIS — Z923 Personal history of irradiation: Secondary | ICD-10-CM | POA: Diagnosis not present

## 2018-06-04 DIAGNOSIS — J3081 Allergic rhinitis due to animal (cat) (dog) hair and dander: Secondary | ICD-10-CM | POA: Diagnosis not present

## 2018-06-04 DIAGNOSIS — D696 Thrombocytopenia, unspecified: Secondary | ICD-10-CM

## 2018-06-04 DIAGNOSIS — J3089 Other allergic rhinitis: Secondary | ICD-10-CM | POA: Diagnosis not present

## 2018-06-04 DIAGNOSIS — J301 Allergic rhinitis due to pollen: Secondary | ICD-10-CM | POA: Diagnosis not present

## 2018-06-04 NOTE — Progress Notes (Signed)
Patient here today for follow up.   

## 2018-06-05 ENCOUNTER — Ambulatory Visit
Admission: RE | Admit: 2018-06-05 | Discharge: 2018-06-05 | Disposition: A | Discharge status: Home / Self Care | Payer: PPO | Source: Ambulatory Visit | Attending: Family Medicine | Admitting: Family Medicine

## 2018-06-05 DIAGNOSIS — N183 Chronic kidney disease, stage 3 unspecified: Secondary | ICD-10-CM

## 2018-06-05 LAB — PROTEIN / CREATININE RATIO, URINE
Creatinine, Urine: 77 mg/dL (ref 20–275)
Protein/Creat Ratio: 65 mg/g{creat} (ref 21–161)
Protein/Creatinine Ratio: 0.065 mg/mg{creat} (ref 0.021–0.16)
Total Protein, Urine: 5 mg/dL (ref 5–24)

## 2018-06-05 MED ORDER — LETROZOLE 2.5 MG PO TABS: 2.5000 mg | ORAL_TABLET | Freq: Every day | ORAL | 3 refills | Status: DC

## 2018-06-06 ENCOUNTER — Ambulatory Visit
Admission: RE | Admit: 2018-06-06 | Discharge: 2018-06-06 | Disposition: A | Discharge status: Home / Self Care | Payer: PPO | Source: Ambulatory Visit | Attending: General Surgery | Admitting: General Surgery

## 2018-06-06 DIAGNOSIS — D0511 Intraductal carcinoma in situ of right breast: Secondary | ICD-10-CM

## 2018-06-06 DIAGNOSIS — R928 Other abnormal and inconclusive findings on diagnostic imaging of breast: Secondary | ICD-10-CM | POA: Diagnosis not present

## 2018-06-06 DIAGNOSIS — Z780 Asymptomatic menopausal state: Secondary | ICD-10-CM | POA: Insufficient documentation

## 2018-06-06 DIAGNOSIS — M8589 Other specified disorders of bone density and structure, multiple sites: Secondary | ICD-10-CM | POA: Diagnosis not present

## 2018-06-06 DIAGNOSIS — J301 Allergic rhinitis due to pollen: Secondary | ICD-10-CM | POA: Diagnosis not present

## 2018-06-06 DIAGNOSIS — J3081 Allergic rhinitis due to animal (cat) (dog) hair and dander: Secondary | ICD-10-CM | POA: Diagnosis not present

## 2018-06-06 DIAGNOSIS — J3089 Other allergic rhinitis: Secondary | ICD-10-CM | POA: Diagnosis not present

## 2018-06-06 HISTORY — DX: Personal history of irradiation: Z92.3

## 2018-06-07 ENCOUNTER — Other Ambulatory Visit: Payer: PPO

## 2018-06-10 ENCOUNTER — Other Ambulatory Visit: Payer: Self-pay | Admitting: Family Medicine

## 2018-06-11 ENCOUNTER — Telehealth: Payer: Self-pay | Admitting: *Deleted

## 2018-06-11 ENCOUNTER — Encounter: Payer: Self-pay | Admitting: General Surgery

## 2018-06-11 ENCOUNTER — Ambulatory Visit (INDEPENDENT_AMBULATORY_CARE_PROVIDER_SITE_OTHER): Payer: PPO | Admitting: General Surgery

## 2018-06-11 VITALS — BP 122/70 | HR 80 | Temp 97.9°F | Ht 60.0 in | Wt 159.0 lb

## 2018-06-11 DIAGNOSIS — J301 Allergic rhinitis due to pollen: Secondary | ICD-10-CM | POA: Diagnosis not present

## 2018-06-11 DIAGNOSIS — D0511 Intraductal carcinoma in situ of right breast: Secondary | ICD-10-CM | POA: Diagnosis not present

## 2018-06-11 DIAGNOSIS — J3089 Other allergic rhinitis: Secondary | ICD-10-CM | POA: Diagnosis not present

## 2018-06-11 DIAGNOSIS — J3081 Allergic rhinitis due to animal (cat) (dog) hair and dander: Secondary | ICD-10-CM | POA: Diagnosis not present

## 2018-06-11 NOTE — Telephone Encounter (Signed)
-----   Message from Robert Bellow, MD sent at 06/11/2018  8:54 AM EDT ----- Contact patient to let her know the bone density test is unchanged: thin bones but not osteoporosis. Be sure she is taking at least 1200 mg of calcium a day. (Not in her med list of January.) ----- Message ----- From: Interface, Rad Results In Sent: 06/06/2018   2:46 PM EDT To: Robert Bellow, MD

## 2018-06-11 NOTE — Patient Instructions (Addendum)
Patient to take calcium 1200 with vitamin D. Call and report after starting Femara.  The patient has been asked to return to the office in one year with a bilateral diagnostic mammogram.The patient is aware to call back for any questions or concerns.

## 2018-06-11 NOTE — Progress Notes (Signed)
Patient ID: Gwendolyn King, female   DOB: 11-Jul-1941, 77 y.o.   MRN: [id number]  Chief Complaint  Patient presents with  . Follow-up    HPI Gwendolyn King is a 77 y.o. female who presents for a breast evaluation. Patient had a breast lumpectomy done on 06/07/2017. The most recent mammogram was done on 06/06/2018.  Patient does perform regular self breast checks and gets regular mammograms done. Patient states that she has some soreness on her right breast after she goes to the gym.  HPI  Past Medical History:  Diagnosis Date  . Allergy    hay fever, fall allergies  . Arthritis   . Asthma    as a child, dad was smoker  . 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  . Cataract    Bil  . Chicken pox   . Colon polyp   . Constipation   . Hyperlipidemia   . Hypothyroidism   . Palpitations    over 20 yeras ago  . Personal history of radiation therapy   . Thyroid disease     Past Surgical History:  Procedure Laterality Date  . ABDOMINAL HYSTERECTOMY  1981  . APPENDECTOMY  2012  . 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 BREAST LUMPECTOMY Right 06/07/2017   DUCTAL CARCINOMA IN SITU (DCIS)/24 radiation treatments  . BREAST LUMPECTOMY Right 06/07/2017   PAPILLARY LESION CONSISTENT WITH PAPILLARY CARCINOMA  . BREAST LUMPECTOMY WITH SENTINEL LYMPH NODE BIOPSY Right 06/07/2017   Wide excision of 2 foci of high-grade DCIS, microinvasion noted only on core biopsy. 2 microscopic foci lateral inferior margins. Patient elected not to proceed to reexcision.  . COLONOSCOPY    . COLONOSCOPY W/ POLYPECTOMY    . FINGER FRACTURE SURGERY Left    5th  . HAMMER TOE SURGERY  09/20/2017  . JOINT REPLACEMENT     right knee  . ROTATOR CUFF REPAIR Right   . TONSILLECTOMY AND ADENOIDECTOMY  1963  . TOTAL KNEE ARTHROPLASTY Right 10/17/2016   Procedure: TOTAL KNEE ARTHROPLASTY;  Surgeon: Melrose Nakayama, MD;  Location: [facility];  Service: Orthopedics;  Laterality: Right;  Marland Kitchen VAGINAL DELIVERY     3    Family History  Problem Relation Age of Onset  . Arthritis Mother   . Heart disease Mother   . Hypertension Mother   . Arthritis Father   . Heart disease Father   . Hypertension Father   . Heart attack Father   . Stroke Maternal Grandmother   . Cancer Maternal Aunt        abdominal?  . Hyperlipidemia Maternal Aunt   . Diabetes Paternal Aunt   . Glaucoma Brother   . Hyperlipidemia Other   . Diabetes Other   . Breast cancer Neg Hx     Social History Social History   Tobacco Use  . Smoking status: Never Smoker  . Smokeless tobacco: Never Used  Substance Use Topics  . Alcohol use: No  . Drug use: No    Allergies  Allergen Reactions  . Lipitor [Atorvastatin] Other (See Comments)    Myalgia, arthralgia  . Simvastatin Other (See Comments)    myalgia    Current Outpatient Medications  Medication Sig Dispense Refill  . acebutolol (SECTRAL) 200 MG capsule Take 1 capsule by mouth daily 90 capsule 2  . acetaminophen (TYLENOL) 500 MG  tablet Take 1,000 mg by mouth at bedtime.    Marland Kitchen aspirin EC 81 MG tablet Take 81 mg by mouth daily.    Marland Kitchen azelastine (OPTIVAR) 0.05 % ophthalmic solution INSTILL 1 DROP INTO AFFECTED EYE TWICE DAILY FOR 30 DAYS  3  . Biotin (BIOTIN 5000) 5 MG CAPS Take 5 mg by mouth daily.     . Cholecalciferol (VITAMIN D-3) 1000 UNITS CAPS Take 1,000 Units by mouth every evening.     . fluticasone (FLONASE) 50 MCG/ACT nasal spray USE 1 TO 2 SPRAY(S) IN EACH NOSTRIL ONCE DAILY FOR 30 DAYS  3  . KRILL OIL PO Take 1 capsule by mouth daily.     Marland Kitchen letrozole (FEMARA) 2.5 MG tablet Take 1 tablet (2.5 mg total) by mouth daily. 90 tablet 3  . levocetirizine (XYZAL) 5 MG tablet TAKE 1 TABLET BY MOUTH ONCE DAILY IN THE EVENING FOR 30 DAYS  3  . LEVOXYL 75 MCG tablet Take 1 tablet by mouth once daily 90 tablet 2  .  montelukast (SINGULAIR) 10 MG tablet TAKE 1 TABLET BY MOUTH ONCE DAILY FOR 30 DAYS  3  . polyethylene glycol powder (GLYCOLAX/MIRALAX) powder Take 1 Container by mouth once.    . triamterene-hydrochlorothiazide (DYAZIDE) 37.5-25 MG capsule TAKE 1 CAPSULE BY MOUTH ONCE DAILY IN THE MORNING  3   No current facility-administered medications for this visit.     Review of Systems Review of Systems  Constitutional: Negative.   Respiratory: Negative.   Cardiovascular: Negative.     Blood pressure 122/70, pulse 80, temperature 97.9 F (36.6 C), temperature source Skin, height 5' (1.524 m), weight 159 lb (72.1 kg).  Physical Exam Physical Exam  Constitutional: She is oriented to person, place, and time. She appears well-developed and well-nourished.  Eyes: Conjunctivae are normal. No scleral icterus.  Neck: Neck supple.  Cardiovascular: Normal rate, regular rhythm and normal heart sounds.  Pulmonary/Chest: Effort normal and breath sounds normal. Right breast exhibits no inverted nipple, no mass, no nipple discharge, no skin change and no tenderness. Left breast exhibits no inverted nipple, no mass, no nipple discharge, no skin change and no tenderness.    Lymphadenopathy:    She has no cervical adenopathy.    She has no axillary adenopathy.  Neurological: She is alert and oriented to person, place, and time.  Skin: Skin is warm and dry.    Data Reviewed June 06, 2018 mammograms reviewed.  BI-RADS-2.  Bone density of June 06, 2018 showed osteopenia in 2 of 3 tested sites.  Weight gain, attributed to tamoxifen.  Assessment    The patient is planning to stop her tamoxifen and 1 month later start Femara provided by medical oncology.  Importance of adequate calcium intake in light of the recent bone density test was reviewed    Plan  Patient to take calcium 1200 with vitamin D.  The patient has been asked to return to the office in one year with a bilateral diagnostic  mammogram.The patient is aware to call back for any questions or concerns.   HPI, Physical Exam, Assessment and Plan have been scribed under the direction and in the presence of Hervey Ard, MD.  Gaspar Cola, CMA  I have completed the exam and reviewed the above documentation for accuracy and completeness.  I agree with the above.  Haematologist has been used and any errors in dictation or transcription are unintentional.  Hervey Ard, M.D., F.A.C.S.  Forest Gleason Sheretha Shadd 06/12/2018, 9:09 PM

## 2018-06-11 NOTE — Telephone Encounter (Signed)
Notified patient as instructed, patient agrees. Patient has not been taking calcium daily but is now aware that she will need to start taking 1200 mg a day

## 2018-06-13 DIAGNOSIS — J3089 Other allergic rhinitis: Secondary | ICD-10-CM | POA: Diagnosis not present

## 2018-06-13 DIAGNOSIS — J301 Allergic rhinitis due to pollen: Secondary | ICD-10-CM | POA: Diagnosis not present

## 2018-06-13 DIAGNOSIS — J3081 Allergic rhinitis due to animal (cat) (dog) hair and dander: Secondary | ICD-10-CM | POA: Diagnosis not present

## 2018-06-18 DIAGNOSIS — J3081 Allergic rhinitis due to animal (cat) (dog) hair and dander: Secondary | ICD-10-CM | POA: Diagnosis not present

## 2018-06-18 DIAGNOSIS — J301 Allergic rhinitis due to pollen: Secondary | ICD-10-CM | POA: Diagnosis not present

## 2018-06-18 DIAGNOSIS — J3089 Other allergic rhinitis: Secondary | ICD-10-CM | POA: Diagnosis not present

## 2018-06-20 DIAGNOSIS — J301 Allergic rhinitis due to pollen: Secondary | ICD-10-CM | POA: Diagnosis not present

## 2018-06-20 DIAGNOSIS — J3089 Other allergic rhinitis: Secondary | ICD-10-CM | POA: Diagnosis not present

## 2018-06-20 DIAGNOSIS — J3081 Allergic rhinitis due to animal (cat) (dog) hair and dander: Secondary | ICD-10-CM | POA: Diagnosis not present

## 2018-06-25 DIAGNOSIS — J3081 Allergic rhinitis due to animal (cat) (dog) hair and dander: Secondary | ICD-10-CM | POA: Diagnosis not present

## 2018-06-25 DIAGNOSIS — J301 Allergic rhinitis due to pollen: Secondary | ICD-10-CM | POA: Diagnosis not present

## 2018-06-25 DIAGNOSIS — J3089 Other allergic rhinitis: Secondary | ICD-10-CM | POA: Diagnosis not present

## 2018-06-27 DIAGNOSIS — J3081 Allergic rhinitis due to animal (cat) (dog) hair and dander: Secondary | ICD-10-CM | POA: Diagnosis not present

## 2018-06-27 DIAGNOSIS — J3089 Other allergic rhinitis: Secondary | ICD-10-CM | POA: Diagnosis not present

## 2018-06-27 DIAGNOSIS — J301 Allergic rhinitis due to pollen: Secondary | ICD-10-CM | POA: Diagnosis not present

## 2018-06-28 DIAGNOSIS — H2513 Age-related nuclear cataract, bilateral: Secondary | ICD-10-CM | POA: Diagnosis not present

## 2018-07-02 DIAGNOSIS — J3089 Other allergic rhinitis: Secondary | ICD-10-CM | POA: Diagnosis not present

## 2018-07-02 DIAGNOSIS — J3081 Allergic rhinitis due to animal (cat) (dog) hair and dander: Secondary | ICD-10-CM | POA: Diagnosis not present

## 2018-07-02 DIAGNOSIS — J301 Allergic rhinitis due to pollen: Secondary | ICD-10-CM | POA: Diagnosis not present

## 2018-07-04 DIAGNOSIS — J3081 Allergic rhinitis due to animal (cat) (dog) hair and dander: Secondary | ICD-10-CM | POA: Diagnosis not present

## 2018-07-04 DIAGNOSIS — J3089 Other allergic rhinitis: Secondary | ICD-10-CM | POA: Diagnosis not present

## 2018-07-04 DIAGNOSIS — J301 Allergic rhinitis due to pollen: Secondary | ICD-10-CM | POA: Diagnosis not present

## 2018-07-09 ENCOUNTER — Encounter: Payer: Self-pay | Admitting: Family Medicine

## 2018-07-09 DIAGNOSIS — J3089 Other allergic rhinitis: Secondary | ICD-10-CM | POA: Diagnosis not present

## 2018-07-09 DIAGNOSIS — J3081 Allergic rhinitis due to animal (cat) (dog) hair and dander: Secondary | ICD-10-CM | POA: Diagnosis not present

## 2018-07-09 DIAGNOSIS — J301 Allergic rhinitis due to pollen: Secondary | ICD-10-CM | POA: Diagnosis not present

## 2018-07-11 ENCOUNTER — Encounter: Payer: Self-pay | Admitting: Family Medicine

## 2018-07-11 ENCOUNTER — Ambulatory Visit (INDEPENDENT_AMBULATORY_CARE_PROVIDER_SITE_OTHER): Payer: PPO | Admitting: Family Medicine

## 2018-07-11 VITALS — BP 148/76 | HR 64 | Temp 97.9°F | Ht 60.5 in | Wt 160.8 lb

## 2018-07-11 DIAGNOSIS — M5441 Lumbago with sciatica, right side: Secondary | ICD-10-CM

## 2018-07-11 DIAGNOSIS — N183 Chronic kidney disease, stage 3 unspecified: Secondary | ICD-10-CM

## 2018-07-11 MED ORDER — METHYLPREDNISOLONE 4 MG PO TBPK: ORAL_TABLET | ORAL | 0 refills | Status: DC

## 2018-07-11 NOTE — Progress Notes (Signed)
Subjective:    Patient ID: Gwendolyn King, female    DOB: 05/30/1941, 77 y.o.   MRN: [id number]  HPI  Patient presents to clinic complaining of right-sided low back pain that radiates down into right hip and down into top of right leg for past few days.  Patient has seen physical therapy for this, has been given some exercises to do.  Patient states when she is sitting the pain level is a 1, states notices the pain more so when she is standing and will write the pain between a 5 or 8.  Also reports there are times where she does not feel pain at all, but will do a certain motions like main to the right side and pain will catch.  Patient has been avoiding NSAIDs due to her kidney functions.  Patient also reports she has been told she has scoliosis in her back before, she is not sure exact location of scoliosis.  Patient is very concerned about what chronic kidney disease stage III means.  States she is afraid to take almost any medication due to not knowing what will affect her kidneys.  Patient states she is worried she will end up on dialysis.  BMP Latest Ref Rng & Units 05/22/2018 02/18/2018 06/01/2017  Glucose 70 - 99 mg/dL 125(H) - 85  BUN 6 - 23 mg/dL 20 - 26(H)  Creatinine 0.40 - 1.20 mg/dL 1.28(H) 1.10(H) 1.23(H)  Sodium 135 - 145 mEq/L 141 - 141  Potassium 3.5 - 5.1 mEq/L 3.8 - 3.9  Chloride 96 - 112 mEq/L 104 - 105  CO2 19 - 32 mEq/L 29 - 27  Calcium 8.4 - 10.5 mg/dL 9.8 - 10.0   GFR on 05/22/18: 43.17  Patient Active Problem List   Diagnosis Date Noted  . CKD (chronic kidney disease) stage 3, GFR 30-59 ml/min (HCC) 05/22/2018  . Cough 12/12/2017  . 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 Mass of lower outer quadrant of right breast 05/31/2017  . Ductal carcinoma in situ (DCIS) of right breast 05/17/2017  . Mastalgia 05/17/2017  . Primary osteoarthritis of right knee 10/17/2016  . Obesity 05/18/2016  . Hyperlipidemia 03/26/2015  . Palpitations 02/03/2015  .  Hypothyroidism 05/01/2012  . Hypertension 05/01/2012   Social History   Tobacco Use  . Smoking status: Never Smoker  . Smokeless tobacco: Never Used  Substance Use Topics  . Alcohol use: No   Review of Systems  Constitutional: Negative for chills, fatigue and fever.  HENT: Negative for congestion, ear pain, sinus pain and sore throat.   Eyes: Negative.   Respiratory: Negative for cough, shortness of breath and wheezing.   Cardiovascular: Negative for chest pain, palpitations and leg swelling.  Gastrointestinal: Negative for abdominal pain, diarrhea, nausea and vomiting.  Genitourinary: Negative for dysuria, frequency and urgency.  Musculoskeletal: right low back pain Skin: Negative for color change, pallor and rash.  Neurological: Negative for syncope, light-headedness and headaches.  Psychiatric/Behavioral: The patient is not nervous/anxious.       Objective:   Physical Exam  Constitutional: She is oriented to person, place, and time. No distress.  HENT:  Head: Normocephalic and atraumatic.  Eyes: Conjunctivae and EOM are normal. No scleral icterus.  Neck: Neck supple. No tracheal deviation present.  Cardiovascular: Normal rate and regular rhythm.  Pulmonary/Chest: Effort normal and breath sounds normal. No respiratory distress.  Musculoskeletal: She exhibits no edema or deformity.       Back:  Exam right-sided low  back tenderness indicated by red circle on diagram.  Pain is reproducible with palpation of area.  Does have some pain with right straight leg raise.  Gait is normal.  Neurological: She is alert and oriented to person, place, and time. No cranial nerve deficit or sensory deficit. She exhibits normal muscle tone. Coordination normal.  Skin: Skin is warm and dry. No pallor.  Psychiatric: She has a normal mood and affect. Her behavior is normal.  Nursing note and vitals reviewed.     Vitals:   07/11/18 1125  BP: (!) 148/76  Pulse: 64  Temp: 97.9 F (36.6 C)    SpO2: 97%   Assessment & Plan:    A total of 25 minutes were spent face-to-face with the patient during this encounter and over half of that time was spent on counseling and coordination of care. The patient was counseled on back pain treatment, scoliosis, CKD stage 3.   Acute right-sided low back pain/scoliosis- I looked back through patient chart and did find a chest x-ray that noted scoliosis.  Explained to patient that scoliosis was most likely in her thoracic spine area.  Explained to patient that the x-ray did not go into detail about severity of scoliosis only that was present.  Patient will take prednisone taper to help improve right-sided low back pain.  She will also continue to do her physical therapy exercises and sessions as she has been doing.  Also suggested to a topical rub such as BenGay or Biofreeze to help improve pain.  Patient aware that if her pain continues to persist over the next 1 to 2 weeks, she can return to clinic and we will do x-ray.  Chronic kidney disease stage III- I explained to patient that often labs will rate a GFR as greater than 60 or less than 60, lab does not always calculate a GFR rate if it is greater than 60.  Once a GFR is less than 60, this is when you are diagnosed with chronic kidney disease stage III.  I also explained to patient that chronic kidney disease stage III is something that we monitor.  She has already been advised to avoid NSAIDs, and her kidney numbers are being monitored closely.  Patient did have a renal ultrasound and urine has been checked for protein.  Reassured patient that being and chronic kidney disease stage III does not mean she is going to need dialysis anytime soon.  Also reassured patient that is okay for her to take her Dyazide every other day as advised by Dr. Caryl Bis.  Patient will follow up regularly with her PCP as planned.  Advised to return to clinic sooner if any issues arise or current symptoms persist or  worsen.

## 2018-07-12 DIAGNOSIS — J3089 Other allergic rhinitis: Secondary | ICD-10-CM | POA: Diagnosis not present

## 2018-07-12 DIAGNOSIS — J301 Allergic rhinitis due to pollen: Secondary | ICD-10-CM | POA: Diagnosis not present

## 2018-07-12 DIAGNOSIS — J3081 Allergic rhinitis due to animal (cat) (dog) hair and dander: Secondary | ICD-10-CM | POA: Diagnosis not present

## 2018-07-23 DIAGNOSIS — J301 Allergic rhinitis due to pollen: Secondary | ICD-10-CM | POA: Diagnosis not present

## 2018-07-23 DIAGNOSIS — J3089 Other allergic rhinitis: Secondary | ICD-10-CM | POA: Diagnosis not present

## 2018-07-23 DIAGNOSIS — J3081 Allergic rhinitis due to animal (cat) (dog) hair and dander: Secondary | ICD-10-CM | POA: Diagnosis not present

## 2018-07-23 DIAGNOSIS — H918X3 Other specified hearing loss, bilateral: Secondary | ICD-10-CM | POA: Diagnosis not present

## 2018-07-25 DIAGNOSIS — J301 Allergic rhinitis due to pollen: Secondary | ICD-10-CM | POA: Diagnosis not present

## 2018-07-25 DIAGNOSIS — J3089 Other allergic rhinitis: Secondary | ICD-10-CM | POA: Diagnosis not present

## 2018-07-25 DIAGNOSIS — J3081 Allergic rhinitis due to animal (cat) (dog) hair and dander: Secondary | ICD-10-CM | POA: Diagnosis not present

## 2018-07-27 ENCOUNTER — Encounter: Payer: Self-pay | Admitting: Family Medicine

## 2018-07-30 DIAGNOSIS — J3089 Other allergic rhinitis: Secondary | ICD-10-CM | POA: Diagnosis not present

## 2018-07-30 DIAGNOSIS — J301 Allergic rhinitis due to pollen: Secondary | ICD-10-CM | POA: Diagnosis not present

## 2018-07-30 DIAGNOSIS — J3081 Allergic rhinitis due to animal (cat) (dog) hair and dander: Secondary | ICD-10-CM | POA: Diagnosis not present

## 2018-07-30 NOTE — Telephone Encounter (Signed)
Called and spoke with patient. Pt stated that she will call back to schedule her appt due to dealing with husband just being discharge from surgery today.

## 2018-07-31 ENCOUNTER — Ambulatory Visit (INDEPENDENT_AMBULATORY_CARE_PROVIDER_SITE_OTHER): Payer: PPO | Admitting: Family Medicine

## 2018-07-31 ENCOUNTER — Encounter: Payer: Self-pay | Admitting: Family Medicine

## 2018-07-31 VITALS — BP 138/68 | HR 62 | Temp 97.7°F | Ht 60.0 in | Wt 161.0 lb

## 2018-07-31 DIAGNOSIS — R600 Localized edema: Secondary | ICD-10-CM

## 2018-07-31 DIAGNOSIS — N8110 Cystocele, unspecified: Secondary | ICD-10-CM

## 2018-07-31 DIAGNOSIS — Z90710 Acquired absence of both cervix and uterus: Secondary | ICD-10-CM | POA: Diagnosis not present

## 2018-07-31 NOTE — Patient Instructions (Signed)

## 2018-07-31 NOTE — Progress Notes (Signed)
Subjective:    Patient ID: Gwendolyn King, female    DOB: 1940-11-13, 77 y.o.   MRN: [id number]  HPI  Presents to clinic with possible mass in vaginal/pelvic area.  Patient states she noticed the bulge a few days ago, felt the area and describes it as approximately the size of an egg.  States she attempted to look at the area with a mirror but could not really get a good view of what she was feeling.  Patient is status post hysterectomy.  She also has had 3 children, a single child and then a set of twins.  Denies any burning or pain with urination.  Denies any issues of bowel movements.  Patient also reports some swelling in her lower extremities, states she has not taken her Dyazide in the past 4 days due to being busy at the hospital with her husband.  Patient Active Problem List   Diagnosis Date Noted  . CKD (chronic kidney disease) stage 3, GFR 30-59 ml/min (HCC) 05/22/2018  . Cough 12/12/2017  . 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 Mass of lower outer quadrant of right breast 05/31/2017  . Ductal carcinoma in situ (DCIS) of right breast 05/17/2017  . Mastalgia 05/17/2017  . Primary osteoarthritis of right knee 10/17/2016  . Obesity 05/18/2016  . Hyperlipidemia 03/26/2015  . Palpitations 02/03/2015  . Hypothyroidism 05/01/2012  . Hypertension 05/01/2012   Social History   Tobacco Use  . Smoking status: Never Smoker  . Smokeless tobacco: Never Used  Substance Use Topics  . Alcohol use: No   Review of Systems   Constitutional: Negative for chills, fatigue and fever.  HENT: Negative for congestion, ear pain, sinus pain and sore throat.   Eyes: Negative.   Respiratory: Negative for cough, shortness of breath and wheezing.   Cardiovascular: Negative for chest pain, palpitations. +LE swelling.  Gastrointestinal: Negative for abdominal pain, diarrhea, nausea and vomiting.  Genitourinary: +mass in pelvic area - ?prolapse.  Musculoskeletal: Negative for  arthralgias and myalgias.  Skin: Negative for color change, pallor and rash.  Neurological: Negative for syncope, light-headedness and headaches.  Psychiatric/Behavioral: The patient is not nervous/anxious.       Objective:   Physical Exam  Constitutional: She is oriented to person, place, and time. She appears well-nourished. No distress.  HENT:  Head: Normocephalic and atraumatic.  Eyes: Conjunctivae and EOM are normal. No scleral icterus.  Neck: Neck supple. No tracheal deviation present.  Cardiovascular: Normal rate and regular rhythm.  Pulmonary/Chest: Effort normal and breath sounds normal. No respiratory distress.  Abdominal: Soft. Bowel sounds are normal. She exhibits no distension. There is no tenderness. Hernia confirmed negative in the right inguinal area and confirmed negative in the left inguinal area.  Genitourinary: Vagina normal.    Pelvic exam was performed with patient supine. There is no rash, tenderness, lesion or injury on the right labia. There is no rash, tenderness, lesion or injury on the left labia. No tenderness or bleeding in the vagina. No foreign body in the vagina. No vaginal discharge found.  Genitourinary Comments: Cystocele seen with pelvic exam  Musculoskeletal: She exhibits edema (+1 edema bilat feet/ankles.).  Neurological: She is alert and oriented to person, place, and time.  Skin: Skin is warm and dry. No pallor.  Psychiatric: She has a normal mood and affect. Her behavior is normal.  Nursing note and vitals reviewed.     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  Discussed different options and patient is interested in seeing uro-gynecologist for evaluation and further treatment.  Patient given informational handout about cystocele and thinks she can do at home in the meantime before seeing  specialist including Keagle exercises and avoiding straining in the pelvic area.  Lower extremity edema - patient advised to take the Dyazide every other day as she had been doing previously.  Taking the Dyazide every other day should help the edema to improve, also advised to elevate bilateral lower extremities whenever able to do so.  Keep regularly scheduled follow-up with PCP as planned.  Return [place] to clinic sooner if any issues arise.

## 2018-08-01 DIAGNOSIS — J3089 Other allergic rhinitis: Secondary | ICD-10-CM | POA: Diagnosis not present

## 2018-08-01 DIAGNOSIS — J301 Allergic rhinitis due to pollen: Secondary | ICD-10-CM | POA: Diagnosis not present

## 2018-08-01 DIAGNOSIS — J3081 Allergic rhinitis due to animal (cat) (dog) hair and dander: Secondary | ICD-10-CM | POA: Diagnosis not present

## 2018-08-06 DIAGNOSIS — J301 Allergic rhinitis due to pollen: Secondary | ICD-10-CM | POA: Diagnosis not present

## 2018-08-06 DIAGNOSIS — J3081 Allergic rhinitis due to animal (cat) (dog) hair and dander: Secondary | ICD-10-CM | POA: Diagnosis not present

## 2018-08-06 DIAGNOSIS — J3089 Other allergic rhinitis: Secondary | ICD-10-CM | POA: Diagnosis not present

## 2018-08-08 DIAGNOSIS — J3089 Other allergic rhinitis: Secondary | ICD-10-CM | POA: Diagnosis not present

## 2018-08-08 DIAGNOSIS — J301 Allergic rhinitis due to pollen: Secondary | ICD-10-CM | POA: Diagnosis not present

## 2018-08-08 DIAGNOSIS — J3081 Allergic rhinitis due to animal (cat) (dog) hair and dander: Secondary | ICD-10-CM | POA: Diagnosis not present

## 2018-08-13 DIAGNOSIS — J3089 Other allergic rhinitis: Secondary | ICD-10-CM | POA: Diagnosis not present

## 2018-08-13 DIAGNOSIS — J301 Allergic rhinitis due to pollen: Secondary | ICD-10-CM | POA: Diagnosis not present

## 2018-08-13 DIAGNOSIS — J3081 Allergic rhinitis due to animal (cat) (dog) hair and dander: Secondary | ICD-10-CM | POA: Diagnosis not present

## 2018-08-20 DIAGNOSIS — J3081 Allergic rhinitis due to animal (cat) (dog) hair and dander: Secondary | ICD-10-CM | POA: Diagnosis not present

## 2018-08-20 DIAGNOSIS — J3089 Other allergic rhinitis: Secondary | ICD-10-CM | POA: Diagnosis not present

## 2018-08-20 DIAGNOSIS — J301 Allergic rhinitis due to pollen: Secondary | ICD-10-CM | POA: Diagnosis not present

## 2018-08-27 DIAGNOSIS — J301 Allergic rhinitis due to pollen: Secondary | ICD-10-CM | POA: Diagnosis not present

## 2018-08-27 DIAGNOSIS — J3089 Other allergic rhinitis: Secondary | ICD-10-CM | POA: Diagnosis not present

## 2018-08-27 DIAGNOSIS — J3081 Allergic rhinitis due to animal (cat) (dog) hair and dander: Secondary | ICD-10-CM | POA: Diagnosis not present

## 2018-09-03 DIAGNOSIS — J301 Allergic rhinitis due to pollen: Secondary | ICD-10-CM | POA: Diagnosis not present

## 2018-09-03 DIAGNOSIS — J3081 Allergic rhinitis due to animal (cat) (dog) hair and dander: Secondary | ICD-10-CM | POA: Diagnosis not present

## 2018-09-03 DIAGNOSIS — J3089 Other allergic rhinitis: Secondary | ICD-10-CM | POA: Diagnosis not present

## 2018-09-10 DIAGNOSIS — J3089 Other allergic rhinitis: Secondary | ICD-10-CM | POA: Diagnosis not present

## 2018-09-10 DIAGNOSIS — J3081 Allergic rhinitis due to animal (cat) (dog) hair and dander: Secondary | ICD-10-CM | POA: Diagnosis not present

## 2018-09-10 DIAGNOSIS — J301 Allergic rhinitis due to pollen: Secondary | ICD-10-CM | POA: Diagnosis not present

## 2018-09-13 DIAGNOSIS — J301 Allergic rhinitis due to pollen: Secondary | ICD-10-CM | POA: Diagnosis not present

## 2018-09-13 DIAGNOSIS — J3081 Allergic rhinitis due to animal (cat) (dog) hair and dander: Secondary | ICD-10-CM | POA: Diagnosis not present

## 2018-09-13 DIAGNOSIS — J3089 Other allergic rhinitis: Secondary | ICD-10-CM | POA: Diagnosis not present

## 2018-09-20 DIAGNOSIS — J301 Allergic rhinitis due to pollen: Secondary | ICD-10-CM | POA: Diagnosis not present

## 2018-09-20 DIAGNOSIS — J3089 Other allergic rhinitis: Secondary | ICD-10-CM | POA: Diagnosis not present

## 2018-09-20 DIAGNOSIS — J3081 Allergic rhinitis due to animal (cat) (dog) hair and dander: Secondary | ICD-10-CM | POA: Diagnosis not present

## 2018-09-23 ENCOUNTER — Ambulatory Visit: Payer: PPO | Admitting: Urology

## 2018-09-23 ENCOUNTER — Encounter: Payer: Self-pay | Admitting: Urology

## 2018-09-23 VITALS — BP 145/66 | HR 69 | Ht 60.5 in | Wt 158.0 lb

## 2018-09-23 DIAGNOSIS — N8111 Cystocele, midline: Secondary | ICD-10-CM

## 2018-09-23 DIAGNOSIS — N8110 Cystocele, unspecified: Secondary | ICD-10-CM

## 2018-09-23 LAB — MICROSCOPIC EXAMINATION

## 2018-09-23 LAB — URINALYSIS, COMPLETE
Bilirubin, UA: NEGATIVE
Glucose, UA: NEGATIVE
Ketones, UA: NEGATIVE
Leukocytes, UA: NEGATIVE
Nitrite, UA: POSITIVE — AB
Protein, UA: NEGATIVE
Specific Gravity, UA: >1.03 — ABNORMAL HIGH (ref 1.005–1.030)
Urobilinogen, Ur: 0.2 mg/dL (ref 0.2–1.0)
pH, UA: 5.5 (ref 5.0–7.5)

## 2018-09-23 NOTE — Progress Notes (Signed)
09/23/2018 10:13 AM   Gwendolyn King June 11, 1941 [id number]  Referring provider: Leone Haven, MD [address] [place], [place] [address]  Chief Complaint  Patient presents with  . 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  She feels an uncomfortable bulge and sometimes reduces it.  She has had a hysterectomy.  She can leak if she holds it too long when she goes from a sitting to standing position associated with urgency.  She denies bedwetting and stress incontinence.  She does not wear a pad  She generally voids 0-1 time at night and every 2 or 3 hours during the day.  She has had a hysterectomy  No neurologic issues.  Bowel movements normal.  Not medically treated.  No previous bladder surgery or kidney stones.  Does not have a history of bladder infections  Modifying factors: There are no other modifying factors  Associated signs and symptoms: There are no other associated signs and symptoms Aggravating and relieving factors: There are no other aggravating or relieving factors Severity: Moderate Duration: Persistent   PMH: Past Medical History:  Diagnosis Date  . Allergy    hay fever, fall allergies  . Arthritis   . Asthma    as a child, dad was smoker  . 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  . Cataract    Bil  . Chicken pox   . Colon polyp   . Constipation   . Hyperlipidemia   . Hypothyroidism   . Palpitations    over 20 yeras ago  . Personal history of radiation therapy   . Thyroid disease     Surgical History: Past Surgical History:  Procedure Laterality Date  . ABDOMINAL HYSTERECTOMY  1981  . APPENDECTOMY  2012  . 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 BREAST LUMPECTOMY  Right 06/07/2017   DUCTAL CARCINOMA IN SITU (DCIS)/24 radiation treatments  . BREAST LUMPECTOMY Right 06/07/2017   PAPILLARY LESION CONSISTENT WITH PAPILLARY CARCINOMA  . BREAST LUMPECTOMY WITH SENTINEL LYMPH NODE BIOPSY Right 06/07/2017   Wide excision of 2 foci of high-grade DCIS, microinvasion noted only on core biopsy. 2 microscopic foci lateral inferior margins. Patient elected not to proceed to reexcision.  . COLONOSCOPY    . COLONOSCOPY W/ POLYPECTOMY    . FINGER FRACTURE SURGERY Left    5th  . HAMMER TOE SURGERY  09/20/2017  . JOINT REPLACEMENT     right knee  . ROTATOR CUFF REPAIR Right   . TONSILLECTOMY AND ADENOIDECTOMY  1963  . TOTAL KNEE ARTHROPLASTY Right 10/17/2016   Procedure: TOTAL KNEE ARTHROPLASTY;  Surgeon: Melrose Nakayama, MD;  Location: [facility];  Service: Orthopedics;  Laterality: Right;  Marland Kitchen VAGINAL DELIVERY     3    Home Medications:  Allergies as of 09/23/2018      Reactions   Lipitor [atorvastatin] Other (See Comments)   Myalgia, arthralgia   Simvastatin Other (See Comments)   myalgia      Medication List       Accurate as of September 23, 2018 10:13 AM. Always use your most recent med list.        acebutolol 200 MG capsule Commonly known as:  SECTRAL Take 1 capsule by mouth daily  acetaminophen 500 MG tablet Commonly known as:  TYLENOL Take 1,000 mg by mouth at bedtime.   aspirin EC 81 MG tablet Take 81 mg by mouth daily.   azelastine 0.05 % ophthalmic solution Commonly known as:  OPTIVAR INSTILL 1 DROP INTO AFFECTED EYE TWICE DAILY FOR 30 DAYS   BIOTIN 5000 5 MG Caps Generic drug:  Biotin Take 5 mg by mouth daily.   fluticasone 50 MCG/ACT nasal spray Commonly known as:  FLONASE USE 1 TO 2 SPRAY(S) IN EACH NOSTRIL ONCE DAILY FOR 30 DAYS   KRILL OIL PO Take 1 capsule by mouth daily.   letrozole 2.5 MG tablet Commonly known as:  FEMARA Take 1 tablet (2.5 mg total) by mouth daily.   levocetirizine 5 MG tablet Commonly known as:   XYZAL TAKE 1 TABLET BY MOUTH ONCE DAILY IN THE EVENING FOR 30 DAYS   LEVOXYL 75 MCG tablet Generic drug:  levothyroxine Take 1 tablet by mouth once daily   montelukast 10 MG tablet Commonly known as:  SINGULAIR TAKE 1 TABLET BY MOUTH ONCE DAILY FOR 30 DAYS   polyethylene glycol powder powder Commonly known as:  GLYCOLAX/MIRALAX Take 1 Container by mouth once.   triamterene-hydrochlorothiazide 37.5-25 MG capsule Commonly known as:  DYAZIDE TAKE 1 CAPSULE BY MOUTH ONCE DAILY IN THE MORNING   Vitamin D-3 25 MCG (1000 UT) Caps Take 1,000 Units by mouth every evening.       Allergies:  Allergies  Allergen Reactions  . Lipitor [Atorvastatin] Other (See Comments)    Myalgia, arthralgia  . Simvastatin Other (See Comments)    myalgia    Family History: Family History  Problem Relation Age of Onset  . Arthritis Mother   . Heart disease Mother   . Hypertension Mother   . Arthritis Father   . Heart disease Father   . Hypertension Father   . Heart attack Father   . Stroke Maternal Grandmother   . Cancer Maternal Aunt        abdominal?  . Hyperlipidemia Maternal Aunt   . Diabetes Paternal Aunt   . Glaucoma Brother   . Hyperlipidemia Other   . Diabetes Other   . Breast cancer Neg Hx     Social History:  reports that she has never smoked. She has never used smokeless tobacco. She reports that she does not drink alcohol or use drugs.  ROS: UROLOGY Frequent Urination?: Yes Hard to postpone urination?: No Burning/pain with urination?: No Get up at night to urinate?: No Leakage of urine?: No Urine stream starts and stops?: No Trouble starting stream?: No Do you have to strain to urinate?: No Blood in urine?: No Urinary tract infection?: No Sexually transmitted disease?: No Injury to kidneys or bladder?: No Painful intercourse?: No Weak stream?: No Currently pregnant?: No Vaginal bleeding?: No Last menstrual period?: n  Gastrointestinal Nausea?: No Vomiting?:  No Indigestion/heartburn?: No Diarrhea?: No Constipation?: No  Constitutional Fever: No Night sweats?: No Weight loss?: No Fatigue?: No  Skin Skin rash/lesions?: No Itching?: No  Eyes Blurred vision?: No Double vision?: No  Ears/Nose/Throat Sore throat?: No Sinus problems?: No  Hematologic/Lymphatic Swollen glands?: No Easy bruising?: No  Cardiovascular Leg swelling?: No Chest pain?: No  Respiratory Cough?: No Shortness of breath?: No  Endocrine Excessive thirst?: No  Musculoskeletal Back pain?: No Joint pain?: No  Neurological Headaches?: No Dizziness?: No  Psychologic Depression?: No Anxiety?: No  Physical Exam: BP (!) 145/66 (BP Location: Left Arm, Patient Position: Sitting)   Pulse  69   Ht 5' 0.5" (1.537 m)   Wt 158 lb (71.7 kg)   BMI 30.35 kg/m   Constitutional:  Alert and oriented, No acute distress. HEENT: [place] AT, moist mucus membranes.  Trachea midline, no masses. Cardiovascular: No clubbing, cyanosis, or edema. Respiratory: Normal respiratory effort, no increased work of breathing. GI: Abdomen is soft, nontender, nondistended, no abdominal masses GU: On pelvic examination patient had a large grade 2 cystocele with central defect but it did not reach the introitus.  Her vaginal cuff only descent from approximately 9 cm to approximately 6 or 7 cm but she could not cough or bear down hard.  She had no stress incontinence and no rectocele with moderate hypermobility of the bladder neck. Skin: No rashes, bruises or suspicious lesions. Lymph: No cervical or inguinal adenopathy. Neurologic: Grossly intact, no focal deficits, moving all 4 extremities. Psychiatric: Normal mood and affect.  Laboratory Data: Lab Results  Component Value Date   WBC 4.8 08/15/2017   HGB 14.2 08/15/2017   HCT 41.9 08/15/2017   MCV 87.0 08/15/2017   PLT 94 (L) 08/15/2017    Lab Results  Component Value Date   CREATININE 1.28 (H) 05/22/2018    No results  found for: PSA  No results found for: TESTOSTERONE  No results found for: HGBA1C  Urinalysis    Component Value Date/Time   COLORURINE STRAW (A) 10/09/2016 [place] 10/09/2016 1525   LABSPEC 1.009 10/09/2016 1525   PHURINE 7.0 10/09/2016 1525   GLUCOSEU NEGATIVE 10/09/2016 1525   HGBUR SMALL (A) 10/09/2016 [place] 10/09/2016 1525   BILIRUBINUR neg 09/09/2012 1650   KETONESUR NEGATIVE 10/09/2016 1525   PROTEINUR NEGATIVE 10/09/2016 1525   UROBILINOGEN 0.2 09/09/2012 1650   NITRITE NEGATIVE 10/09/2016 1525   LEUKOCYTESUR SMALL (A) 10/09/2016 1525    Pertinent Imaging:   Assessment & Plan: Patient has prolapse symptoms and mild urge incontinence and minimal frequency and nocturia.  A picture was drawn.  The patient ever had surgery she would need to be consented for cystocele repair and possible vault suspension and graft.  The role of urodynamics discussed.  I was a bit surprised that she is so symptomatic and uncomfortable and she does report vaginal dryness.  The role of estrogen cream as a conservative first approach was discussed.  If she did have urodynamics to size the cystocele could be re-assessed.  I think sometimes it comes down further based upon her history.  We will not try estrogen cream because of a history of breast cancer.  She would like to see gynecology for a possible pessary.  It would be nice to be help nonsurgically.  Otherwise in the future I will order urodynamics and proceed accordingly.  1. Cystocele, midline  - Urinalysis, Complete   No follow-ups on file.  Reece Packer, MD  [facility] [address], [place], [place] [address] [phone]

## 2018-09-25 ENCOUNTER — Ambulatory Visit (INDEPENDENT_AMBULATORY_CARE_PROVIDER_SITE_OTHER): Payer: PPO | Admitting: Obstetrics & Gynecology

## 2018-09-25 ENCOUNTER — Encounter: Payer: Self-pay | Admitting: Obstetrics & Gynecology

## 2018-09-25 VITALS — BP 140/80 | Ht 61.0 in | Wt 159.0 lb

## 2018-09-25 DIAGNOSIS — N8111 Cystocele, midline: Secondary | ICD-10-CM | POA: Diagnosis not present

## 2018-09-25 DIAGNOSIS — N819 Female genital prolapse, unspecified: Secondary | ICD-10-CM | POA: Insufficient documentation

## 2018-09-25 NOTE — Progress Notes (Signed)
Consultant- Urology, Dr Matilde Sprang Reason: Prolapse  Cystocele/Rectocele Patient is a 78 yo G3P3 WF who complains of a vaginal bulge and pressure for 3 mos, maybe longer, but worsened when she had a fall in Nov.  She has associated urgenvy, but rare LOU or [place].  She does not lift much anymore.  Prior NSVD x3.  Prior [facility].  Married but is not sexually active.  PMHx: She  has a past medical history of Allergy, Arthritis, Asthma, Breast cancer ([place]) (05/08/2017), Cataract, Chicken pox, Colon polyp, Constipation, Hyperlipidemia, Hypothyroidism, Palpitations, Personal history of radiation therapy, and Thyroid disease. Also,  has a past surgical history that includes Appendectomy (2012); Tonsillectomy and adenoidectomy (1963); Abdominal hysterectomy (1981); Vaginal delivery; Rotator cuff repair (Right); Finger fracture surgery (Left); Colonoscopy w/ polypectomy; Colonoscopy; Total knee arthroplasty (Right, 10/17/2016); Breast biopsy (Right, 05/08/2017); Joint replacement; Breast lumpectomy (Right, 06/07/2017); Breast lumpectomy (Right, 06/07/2017); Breast lumpectomy with sentinel lymph node bx (Right, 06/07/2017); and Hammer toe surgery (09/20/2017)., family history includes Arthritis in her father and mother; Cancer in her maternal aunt; Diabetes in her paternal aunt and another family member; Glaucoma in her brother; Heart attack in her father; Heart disease in her father and mother; Hyperlipidemia in her maternal aunt and another family member; Hypertension in her father and mother; Stroke in her maternal grandmother.,  reports that she has never smoked. She has never used smokeless tobacco. She reports that she does not drink alcohol or use drugs.  She has a current medication list which includes the following prescription(s): acebutolol, acetaminophen, aspirin ec, azelastine, biotin, vitamin d-3, fluticasone, krill oil, letrozole, levocetirizine, levoxyl, montelukast, polyethylene glycol powder, and  triamterene-hydrochlorothiazide. Also, is allergic to lipitor [atorvastatin] and simvastatin.  Review of Systems  Constitutional: Negative for chills, fever and malaise/fatigue.  HENT: Negative for congestion, sinus pain and sore throat.   Eyes: Negative for blurred vision and pain.  Respiratory: Negative for cough and wheezing.   Cardiovascular: Negative for chest pain and leg swelling.  Gastrointestinal: Negative for abdominal pain, constipation, diarrhea, heartburn, nausea and vomiting.  Genitourinary: Negative for dysuria, frequency, hematuria and urgency.  Musculoskeletal: Negative for back pain, joint pain, myalgias and neck pain.  Skin: Negative for itching and rash.  Neurological: Negative for dizziness, tremors and weakness.  Endo/Heme/Allergies: Does not bruise/bleed easily.  Psychiatric/Behavioral: Negative for depression. The patient is not nervous/anxious and does not have insomnia.     Objective: BP 140/80   Ht 5\' 1"  (1.549 m)   Wt 159 lb (72.1 kg)   BMI 30.04 kg/m  Physical Exam Constitutional:      General: She is not in acute distress.    Appearance: She is well-developed.  Genitourinary:     Pelvic exam was performed with patient supine.     Vagina and rectum normal.     No lesions in the vagina.     No vaginal bleeding.     No right or left adnexal mass present.     Right adnexa not tender.     Left adnexa not tender.     Genitourinary Comments: Absent Uterus Absent cervix Vaginal cuff normal Gr 2 cystocele more distal near vag apex Min rectocele Some vaginal vault prolpase  HENT:     Head: Normocephalic and atraumatic. No laceration.     Right Ear: Hearing normal.     Left Ear: Hearing normal.     Mouth/Throat:     Pharynx: Uvula midline.  Eyes:     Pupils: Pupils are equal, round,  and reactive to light.  Neck:     Musculoskeletal: Normal range of motion and neck supple.     Thyroid: No thyromegaly.  Cardiovascular:     Rate and Rhythm: Normal  rate and regular rhythm.     Heart sounds: No murmur. No friction rub. No gallop.   Pulmonary:     Effort: Pulmonary effort is normal. No respiratory distress.     Breath sounds: Normal breath sounds. No wheezing.  Chest:     Breasts:        Right: No mass, skin change or tenderness.        Left: No mass, skin change or tenderness.  Abdominal:     General: Bowel sounds are normal. There is no distension.     Palpations: Abdomen is soft.     Tenderness: There is no abdominal tenderness. There is no rebound.  Musculoskeletal: Normal range of motion.  Neurological:     Mental Status: She is alert and oriented to person, place, and time.     Cranial Nerves: No cranial nerve deficit.  Skin:    General: Skin is warm and dry.  Psychiatric:        Judgment: Judgment normal.  Vitals signs reviewed.   ASSESSMENT/PLAN:   Problem List Items Addressed This Visit      Genitourinary   Vaginal vault prolapse - Primary   Cystocele, midline    Pessary Fitting Patient presents for a pessary fitting. She desires a pessary as her means of controlling her symptoms of prolapse and/or urinary incontinence. She understands the care needed for a pessary and desires to proceed. Alternative treatment options have been discussed at length and the patient voices an understanding of each option.   PROCEDURE: The patient was placed in dorsal lithotomy position. Examination confirmed prolapse. A 2 ring pessary was fitted without difficulty. The patient subsequently ambulated, voided and performed valsalva maneuvers without dislodging the pessary and without discomfort. Care instructions were provided. Patient was discharged to home in stable condition.  WIll return when ordered pessary arrives for long term placement.  Options for surgery as alternative discussed.  A total of 60 minutes were spent face-to-face with the patient during this encounter and over half of that time dealt with counseling and  coordination of care.  Barnett Applebaum, MD, [facility], [facility] 09/25/2018  2:41 PM

## 2018-09-26 DIAGNOSIS — J3089 Other allergic rhinitis: Secondary | ICD-10-CM | POA: Diagnosis not present

## 2018-09-26 DIAGNOSIS — J3081 Allergic rhinitis due to animal (cat) (dog) hair and dander: Secondary | ICD-10-CM | POA: Diagnosis not present

## 2018-09-26 DIAGNOSIS — J301 Allergic rhinitis due to pollen: Secondary | ICD-10-CM | POA: Diagnosis not present

## 2018-10-01 DIAGNOSIS — J3081 Allergic rhinitis due to animal (cat) (dog) hair and dander: Secondary | ICD-10-CM | POA: Diagnosis not present

## 2018-10-01 DIAGNOSIS — J301 Allergic rhinitis due to pollen: Secondary | ICD-10-CM | POA: Diagnosis not present

## 2018-10-01 DIAGNOSIS — J3089 Other allergic rhinitis: Secondary | ICD-10-CM | POA: Diagnosis not present

## 2018-10-03 ENCOUNTER — Ambulatory Visit
Admission: RE | Admit: 2018-10-03 | Discharge: 2018-10-03 | Disposition: A | Discharge status: Home / Self Care | Payer: PPO | Source: Ambulatory Visit | Attending: Radiation Oncology | Admitting: Radiation Oncology

## 2018-10-03 ENCOUNTER — Encounter: Payer: Self-pay | Admitting: Radiation Oncology

## 2018-10-03 ENCOUNTER — Other Ambulatory Visit: Payer: Self-pay

## 2018-10-03 VITALS — BP 133/53 | HR 63 | Temp 96.3°F | Resp 16 | Wt 157.8 lb

## 2018-10-03 DIAGNOSIS — J3089 Other allergic rhinitis: Secondary | ICD-10-CM | POA: Diagnosis not present

## 2018-10-03 DIAGNOSIS — Z79811 Long term (current) use of aromatase inhibitors: Secondary | ICD-10-CM | POA: Insufficient documentation

## 2018-10-03 DIAGNOSIS — D0511 Intraductal carcinoma in situ of right breast: Secondary | ICD-10-CM | POA: Insufficient documentation

## 2018-10-03 DIAGNOSIS — Z923 Personal history of irradiation: Secondary | ICD-10-CM | POA: Diagnosis not present

## 2018-10-03 DIAGNOSIS — Z170 Estrogen receptor positive status [ER+]: Secondary | ICD-10-CM | POA: Insufficient documentation

## 2018-10-03 DIAGNOSIS — J301 Allergic rhinitis due to pollen: Secondary | ICD-10-CM | POA: Diagnosis not present

## 2018-10-03 DIAGNOSIS — C50511 Malignant neoplasm of lower-outer quadrant of right female breast: Secondary | ICD-10-CM

## 2018-10-03 DIAGNOSIS — J3081 Allergic rhinitis due to animal (cat) (dog) hair and dander: Secondary | ICD-10-CM | POA: Diagnosis not present

## 2018-10-03 NOTE — Progress Notes (Signed)
Radiation Oncology Follow up Note  Name: Gwendolyn King   Date:   10/03/2018 MRN:  [id number] DOB: Jul 15, 1941    This 78 y.o. 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 Tumor was ER positive PR negative she seen today in routine follow-up and is doing well. She specifically denies breast tenderness cough or bone pain..she is currently on Femara tolerating that well without side effect.she mammograms back in March which I have reviewed were BI-RADS 2 benign.  COMPLICATIONS OF TREATMENT: none  FOLLOW UP COMPLIANCE: keeps appointments   PHYSICAL EXAM:  BP (!) 133/53 (BP Location: Left Arm, Patient Position: Sitting)   Pulse 63   Temp (!) 96.3 F (35.7 C) (Tympanic)   Resp 16   Wt 157 lb 13.6 oz (71.6 kg)   BMI 29.83 kg/m  Lungs are clear to A&P cardiac examination essentially unremarkable with regular rate and rhythm. No dominant mass or nodularity is noted in either breast in 2 positions examined. Incision is well-healed. No axillary or supraclavicular adenopathy is appreciated. Cosmetic result is excellent.Well-developed well-nourished patient in NAD. HEENT reveals PERLA, EOMI, discs not visualized.  Oral cavity is clear. No oral mucosal lesions are identified. Neck is clear without evidence of cervical or supraclavicular adenopathy. Lungs are clear to A&P. Cardiac examination is essentially unremarkable with regular rate and rhythm without murmur rub or thrill. Abdomen is benign with no organomegaly or masses noted. Motor sensory and DTR levels are equal and symmetric in the upper and lower extremities.  Cranial nerves II through XII are grossly intact. Proprioception is intact. No peripheral adenopathy or edema is identified. No motor or sensory levels are noted. Crude visual fields are within normal range.  RADIOLOGY RESULTS: mammograms reviewed and compatible with the above-stated findings  PLAN: present time patient is doing well with no evidence of disease.I'm please were overall progress. She is oriented been scheduled follow-up mammograms. She continues on Femara without side effect. I've asked to see her back in 1 year for follow-up. Patient is to call sooner with any concerns.  I would like to take this opportunity to thank you for allowing me to participate in the care of your patient.Noreene Filbert, MD

## 2018-10-08 DIAGNOSIS — J3089 Other allergic rhinitis: Secondary | ICD-10-CM | POA: Diagnosis not present

## 2018-10-08 DIAGNOSIS — J301 Allergic rhinitis due to pollen: Secondary | ICD-10-CM | POA: Diagnosis not present

## 2018-10-08 DIAGNOSIS — J3081 Allergic rhinitis due to animal (cat) (dog) hair and dander: Secondary | ICD-10-CM | POA: Diagnosis not present

## 2018-10-10 DIAGNOSIS — J3081 Allergic rhinitis due to animal (cat) (dog) hair and dander: Secondary | ICD-10-CM | POA: Diagnosis not present

## 2018-10-10 DIAGNOSIS — J3089 Other allergic rhinitis: Secondary | ICD-10-CM | POA: Diagnosis not present

## 2018-10-10 DIAGNOSIS — J301 Allergic rhinitis due to pollen: Secondary | ICD-10-CM | POA: Diagnosis not present

## 2018-10-11 ENCOUNTER — Telehealth: Payer: Self-pay | Admitting: Obstetrics & Gynecology

## 2018-10-11 NOTE — Telephone Encounter (Signed)
Patient is schedule 10/14/18 with [facility]

## 2018-10-11 NOTE — Telephone Encounter (Signed)
-----   Message from [place], LPN sent at 1/61/0960  2:29 PM EST ----- Regarding: FW: pessary Pessary has arrived. Please contact pt & scheduled for placement at her convenience ----- Message ----- From: Gae Dry, MD Sent: 09/25/2018   2:36 PM EST To: Otila Kluver, LPN Subject: pessary                                        Ring w support # 2 and 3 for one of them to use w this pt Call pt for appt when arrives

## 2018-10-14 ENCOUNTER — Encounter: Payer: Self-pay | Admitting: Obstetrics & Gynecology

## 2018-10-14 ENCOUNTER — Ambulatory Visit (INDEPENDENT_AMBULATORY_CARE_PROVIDER_SITE_OTHER): Payer: PPO | Admitting: Obstetrics & Gynecology

## 2018-10-14 VITALS — BP 130/80 | Ht 60.05 in | Wt 158.0 lb

## 2018-10-14 DIAGNOSIS — N8111 Cystocele, midline: Secondary | ICD-10-CM

## 2018-10-14 DIAGNOSIS — N819 Female genital prolapse, unspecified: Secondary | ICD-10-CM | POA: Diagnosis not present

## 2018-10-14 NOTE — Progress Notes (Signed)
  HPI:      [name] is a 78 y.o. G3P3 who presents today for her pessary placement.  She complains of a vaginal bulge and pressure for 3 mos, maybe longer, but worsened when she had a fall in Nov.  She has associated urgenvy, but rare LOU or GSI.  She does not lift much anymore.  Prior NSVD x3.  Prior [facility] 1981. Married but is not sexually active.  PMHx: She  has a past medical history of Allergy, Arthritis, Asthma, Breast cancer ([place]) (05/08/2017), Cataract, Chicken pox, Colon polyp, Constipation, Hyperlipidemia, Hypothyroidism, Palpitations, Personal history of radiation therapy, and Thyroid disease. Also,  has a past surgical history that includes Appendectomy (2012); Tonsillectomy and adenoidectomy (1963); Abdominal hysterectomy (1981); Vaginal delivery; Rotator cuff repair (Right); Finger fracture surgery (Left); Colonoscopy w/ polypectomy; Colonoscopy; Total knee arthroplasty (Right, 10/17/2016); Breast biopsy (Right, 05/08/2017); Joint replacement; Breast lumpectomy (Right, 06/07/2017); Breast lumpectomy (Right, 06/07/2017); Breast lumpectomy with sentinel lymph node bx (Right, 06/07/2017); and Hammer toe surgery (09/20/2017)., family history includes Arthritis in her father and mother; Cancer in her maternal aunt; Diabetes in her paternal aunt and another family member; Glaucoma in her brother; Heart attack in her father; Heart disease in her father and mother; Hyperlipidemia in her maternal aunt and another family member; Hypertension in her father and mother; Stroke in her maternal grandmother.,  reports that she has never smoked. She has never used smokeless tobacco. She reports that she does not drink alcohol or use drugs.  She has a current medication list which includes the following prescription(s): acebutolol, acetaminophen, aspirin ec, azelastine, biotin, vitamin d-3, fluticasone, krill oil, letrozole, levocetirizine, levoxyl, montelukast, polyethylene glycol powder, and  triamterene-hydrochlorothiazide. Also, is allergic to lipitor [atorvastatin] and simvastatin.  Review of Systems  All other systems reviewed and are negative.   Objective: BP 130/80   Ht 5' 0.05" (1.525 m)   Wt 158 lb (71.7 kg)   BMI 30.81 kg/m  Physical Exam Constitutional:      General: She is not in acute distress.    Appearance: She is well-developed.  Genitourinary:     Pelvic exam was performed with patient supine.     Vagina normal.     No vaginal erythema or bleeding.     Genitourinary Comments: Cuff intact/ no lesions Cystocele gr 2 Absent uterus and cervix  HENT:     Head: Normocephalic and atraumatic.     Nose: Nose normal.  Abdominal:     General: There is no distension.     Palpations: Abdomen is soft.     Tenderness: There is no abdominal tenderness.  Musculoskeletal: Normal range of motion.  Neurological:     Mental Status: She is alert and oriented to person, place, and time.     Cranial Nerves: No cranial nerve deficit.  Skin:    General: Skin is warm and dry.   A/P: Pessary RING #2 placed today. Instructions given for care. Concerning symptoms to observe for are counseled to patient. Follow up scheduled for 1 months.  A total of 15 minutes were spent face-to-face with the patient during this encounter and over half of that time dealt with counseling and coordination of care.  Barnett Applebaum, MD, [facility], [facility] 10/14/2018  12:05 PM

## 2018-10-14 NOTE — Patient Instructions (Signed)
Pelvic Floor Dysfunction  Pelvic floor dysfunction (PFD) is a condition that results when the group of muscles and connective tissues that support the organs in the pelvis (pelvic floor muscles) do not work well. These muscles and their connections form a sling that supports the colon and bladder. In men, these muscles also support the prostate gland. In women, they also support the uterus. PFD causes pelvic floor muscles to be too weak, too tight, or a combination of both. In PFD, muscle movements are not coordinated. This condition may cause bowel or bladder problems. It may also cause pain. What are the causes? This condition may be caused by an injury to the pelvic area or by a weakening of pelvic muscles. This often results from pregnancy and childbirth or other types of strain. In many cases, the exact cause is not known. What increases the risk? The following factors may make you more likely to develop this condition:  Having a condition of chronic bladder tissue inflammation (interstitial cystitis).  Being an older person.  Being overweight.  Radiation treatment for cancer in the pelvic region.  Previous pelvic surgery, such as removal of the uterus (hysterectomy) or prostate gland (prostatectomy). What are the signs or symptoms? Symptoms of this condition vary and may include:  Bladder symptoms, such as: ? Trouble starting urination and emptying the bladder. ? Frequent urinary tract infections. ? Leaking urine when coughing, laughing, or exercising (stress incontinence). ? Having to pass urine urgently or frequently. ? Pain when passing urine.  Bowel symptoms, such as: ? Constipation. ? Urgent or frequent bowel movements. ? Incomplete bowel movements. ? Painful bowel movements. ? Leaking stool or gas.  Unexplained genital or rectal pain.  Genital or rectal muscle spasms.  Low back pain. In women, symptoms of PFD may also include:  A heavy, full, or aching feeling in  the vagina.  A bulge that protrudes into the vagina.  Pain during or after sexual intercourse. How is this diagnosed? This condition may be diagnosed based on:  Your symptoms and medical history.  A physical exam. During the exam, your health care provider may check your pelvic muscles for tightness, spasm, pain, or weakness. This may include a rectal exam and a pelvic exam for women. In some cases, you may have diagnostic tests, such as:  Electrical muscle function tests.  Urine flow testing.  X-ray tests of bowel function.  Ultrasound of the pelvic organs. How is this treated? Treatment for this condition depends on your symptoms. Treatment options include:  Physical therapy. This may include Kegel exercises to help relax or strengthen the pelvic floor muscles.  Biofeedback. This type of therapy provides feedback on how tight your pelvic floor muscles are so that you can learn to control them.  Internal or external massage therapy.  A treatment that involves electrical stimulation of the pelvic floor muscles to help control pain (transcutaneous electrical nerve stimulation, or TENS).  Sound wave therapy (ultrasound) to reduce muscle spasms.  Medicines, such as: ? Muscle relaxants. ? Bladder control medicines. Surgery to reconstruct or support pelvic floor muscles may be an option if other treatments do not help. Follow these instructions at home: Activity  Do your usual activities as told by your health care provider. Ask your health care provider if you should modify any activities.  Do pelvic floor strengthening or relaxing exercises at home as told by your physical therapist. Lifestyle  Maintain a healthy weight.  Eat foods that are high in fiber, such as  Do your usual activities as told by your health care provider. Ask your health care provider if you should modify any activities.  · Do pelvic floor strengthening or relaxing exercises at home as told by your physical therapist.  Lifestyle  · Maintain a healthy weight.  · Eat foods that are high in fiber, such as beans, whole grains, and fresh fruits and vegetables.  · Limit foods that are high in fat and processed sugars, such as fried or sweet foods.  · Manage stress with relaxation techniques such as yoga or  meditation.  General instructions  · If you have problems with leakage:  ? Use absorbable pads or wear padded underwear.  ? Wash frequently with mild soap.  ? Keep your genital and anal area as clean and dry as possible.  ? Ask your health care provider if you should try a barrier cream to prevent skin irritation.  · Take warm baths to relieve pelvic muscle tension or spasms.  · Take over-the-counter and prescription medicines only as told by your health care provider.  · Keep all follow-up visits as told by your health care provider. This is important.  Contact a health care provider if you:  · Are not improving with home care.  · Have signs or symptoms of PFD that get worse at home.  · Develop new signs or symptoms at home.  · Have signs of a urinary tract infection, such as:  ? Fever.  ? Chills.  ? Urinary frequency.  ? A burning feeling when urinating.  · Have not had a bowel movement in 3 days (constipation).  Summary  · Pelvic floor dysfunction results when the muscles and connective tissues in your pelvic floor do not work well.  · These muscles and their connections form a sling that supports your colon and bladder. In men, these muscles also support the prostate gland. In women, they also support the uterus.  · PFD may be caused by an injury to the pelvic area or by a weakening of pelvic muscles.  · PFD causes pelvic floor muscles to be too weak, too tight, or a combination of both. Symptoms may vary from person to person.  · In most cases, PFD can be treated with physical therapies and medicines. Surgery may be an option if other treatments do not help.  This information is not intended to replace advice given to you by your health care provider. Make sure you discuss any questions you have with your health care provider.  Document Released: 02/25/2018 Document Revised: 02/25/2018 Document Reviewed: 02/25/2018  Elsevier Interactive Patient Education © 2019 [employer]

## 2018-10-15 DIAGNOSIS — J3089 Other allergic rhinitis: Secondary | ICD-10-CM | POA: Diagnosis not present

## 2018-10-15 DIAGNOSIS — J301 Allergic rhinitis due to pollen: Secondary | ICD-10-CM | POA: Diagnosis not present

## 2018-10-15 DIAGNOSIS — J3081 Allergic rhinitis due to animal (cat) (dog) hair and dander: Secondary | ICD-10-CM | POA: Diagnosis not present

## 2018-10-22 DIAGNOSIS — J3081 Allergic rhinitis due to animal (cat) (dog) hair and dander: Secondary | ICD-10-CM | POA: Diagnosis not present

## 2018-10-22 DIAGNOSIS — J301 Allergic rhinitis due to pollen: Secondary | ICD-10-CM | POA: Diagnosis not present

## 2018-10-22 DIAGNOSIS — J3089 Other allergic rhinitis: Secondary | ICD-10-CM | POA: Diagnosis not present

## 2018-10-29 DIAGNOSIS — J3081 Allergic rhinitis due to animal (cat) (dog) hair and dander: Secondary | ICD-10-CM | POA: Diagnosis not present

## 2018-10-29 DIAGNOSIS — J301 Allergic rhinitis due to pollen: Secondary | ICD-10-CM | POA: Diagnosis not present

## 2018-10-29 DIAGNOSIS — J3089 Other allergic rhinitis: Secondary | ICD-10-CM | POA: Diagnosis not present

## 2018-11-01 ENCOUNTER — Other Ambulatory Visit: Payer: Self-pay

## 2018-11-01 MED ORDER — LEVOXYL 75 MCG PO TABS: ORAL_TABLET | ORAL | 2 refills | Status: DC

## 2018-11-05 DIAGNOSIS — J301 Allergic rhinitis due to pollen: Secondary | ICD-10-CM | POA: Diagnosis not present

## 2018-11-05 DIAGNOSIS — J3089 Other allergic rhinitis: Secondary | ICD-10-CM | POA: Diagnosis not present

## 2018-11-05 DIAGNOSIS — J3081 Allergic rhinitis due to animal (cat) (dog) hair and dander: Secondary | ICD-10-CM | POA: Diagnosis not present

## 2018-11-06 ENCOUNTER — Other Ambulatory Visit: Payer: Self-pay

## 2018-11-06 MED ORDER — ACEBUTOLOL HCL 200 MG PO CAPS: 200.0000 mg | ORAL_CAPSULE | Freq: Every day | ORAL | 0 refills | Status: DC

## 2018-11-11 ENCOUNTER — Other Ambulatory Visit: Payer: Self-pay

## 2018-11-11 ENCOUNTER — Ambulatory Visit (INDEPENDENT_AMBULATORY_CARE_PROVIDER_SITE_OTHER): Payer: PPO | Admitting: Obstetrics & Gynecology

## 2018-11-11 ENCOUNTER — Encounter: Payer: Self-pay | Admitting: Obstetrics & Gynecology

## 2018-11-11 DIAGNOSIS — N8111 Cystocele, midline: Secondary | ICD-10-CM | POA: Diagnosis not present

## 2018-11-11 DIAGNOSIS — N819 Female genital prolapse, unspecified: Secondary | ICD-10-CM

## 2018-11-11 NOTE — Progress Notes (Signed)
Virtual Visit via Telephone Note  I connected with [name] on 11/11/18 at  1:30 PM EDT by telephone and verified that I am speaking with the correct person using two identifiers.   I discussed the limitations, risks, security and privacy concerns of performing an evaluation and management service by telephone and the availability of in person appointments. I also discussed with the patient that there may be a patient responsible charge related to this service. The patient expressed understanding and agreed to proceed.  She was at home and I was in my office.   History of Present Illness:[name] is a 78 y.o. G3P3 who presents today for discussion of her pessary placement related to treatment of her vaginal vault prolapse and cystocele.  Shecomplains of a vaginal bulge and pressure prior to pessary placement with sx's that had worsened when she had a fall in Nov. She had associated urgency, but rare LOU or GSI.She does not lift much anymore. Prior NSVD x3. Prior [facility].Married but is not sexually active.  She reports that since pessary Ring #2 placed in Feb, she has improved sensations with less pressure and urinary complaints.  She does feel the pessary drop down some with BMs at times.  She does have some discharge, without odor, irritation, dryness, or bleeding.   Observations/Objective: No exam today due to remote telehealth visit.  Assessment and Plan: 1. Vaginal vault prolapse 2. Cystocele, midline Continue pessary ring #2 Monitor for prolapse/ expulsion No need for ERT at this time  Follow Up Instructions: 2 mos, with visit q 3 months for cleaning and care   I discussed the assessment and treatment plan with the patient. The patient was provided an opportunity to ask questions and all were answered. The patient agreed with the plan and demonstrated an understanding of the instructions.   The patient was advised to call back or seek an in-person evaluation if the  symptoms worsen or if the condition fails to improve as anticipated.  I provided 9 minutes of non-face-to-face time during this encounter.   Hoyt Koch, MD [facility], [facility] 11/11/2018  1:52 PM

## 2018-11-13 ENCOUNTER — Ambulatory Visit: Payer: PPO | Admitting: Obstetrics & Gynecology

## 2018-11-14 DIAGNOSIS — J3081 Allergic rhinitis due to animal (cat) (dog) hair and dander: Secondary | ICD-10-CM | POA: Diagnosis not present

## 2018-11-14 DIAGNOSIS — J3089 Other allergic rhinitis: Secondary | ICD-10-CM | POA: Diagnosis not present

## 2018-11-14 DIAGNOSIS — J301 Allergic rhinitis due to pollen: Secondary | ICD-10-CM | POA: Diagnosis not present

## 2018-11-19 DIAGNOSIS — J301 Allergic rhinitis due to pollen: Secondary | ICD-10-CM | POA: Diagnosis not present

## 2018-11-19 DIAGNOSIS — J3081 Allergic rhinitis due to animal (cat) (dog) hair and dander: Secondary | ICD-10-CM | POA: Diagnosis not present

## 2018-11-19 DIAGNOSIS — J3089 Other allergic rhinitis: Secondary | ICD-10-CM | POA: Diagnosis not present

## 2018-11-26 DIAGNOSIS — J3081 Allergic rhinitis due to animal (cat) (dog) hair and dander: Secondary | ICD-10-CM | POA: Diagnosis not present

## 2018-11-26 DIAGNOSIS — J3089 Other allergic rhinitis: Secondary | ICD-10-CM | POA: Diagnosis not present

## 2018-11-26 DIAGNOSIS — J301 Allergic rhinitis due to pollen: Secondary | ICD-10-CM | POA: Diagnosis not present

## 2018-12-03 DIAGNOSIS — J3089 Other allergic rhinitis: Secondary | ICD-10-CM | POA: Diagnosis not present

## 2018-12-03 DIAGNOSIS — J3081 Allergic rhinitis due to animal (cat) (dog) hair and dander: Secondary | ICD-10-CM | POA: Diagnosis not present

## 2018-12-03 DIAGNOSIS — J301 Allergic rhinitis due to pollen: Secondary | ICD-10-CM | POA: Diagnosis not present

## 2018-12-04 IMAGING — MG MM DIGITAL DIAGNOSTIC BILAT W/ TOMO W/ CAD
6 of 9 series · 6 of 25 positions shown · non-contrast
Comparison: Previous exam(s).

CLINICAL DATA: Patient presents for bilateral diagnostic
examination due to a history of a right malignant lumpectomy
June 2017.

EXAM:
DIGITAL DIAGNOSTIC BILATERAL MAMMOGRAM WITH CAD AND TOMO

[R MLO]
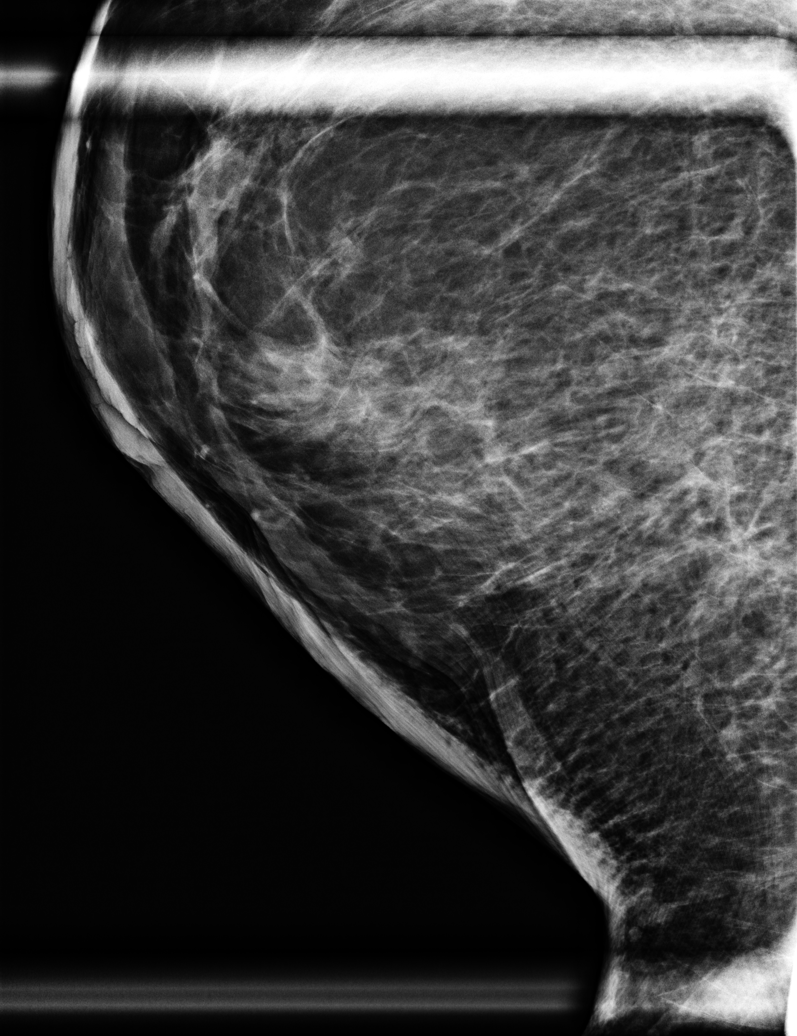

[L MLO synth-2D]
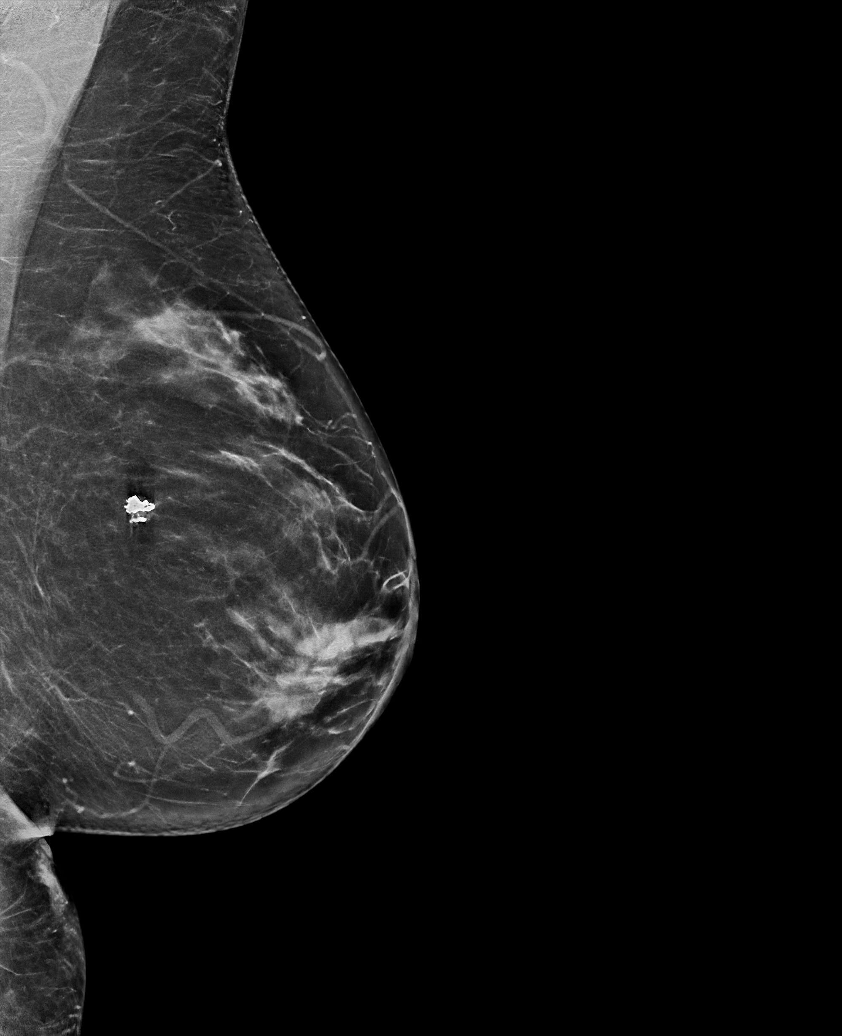

[R CC synth-2D]
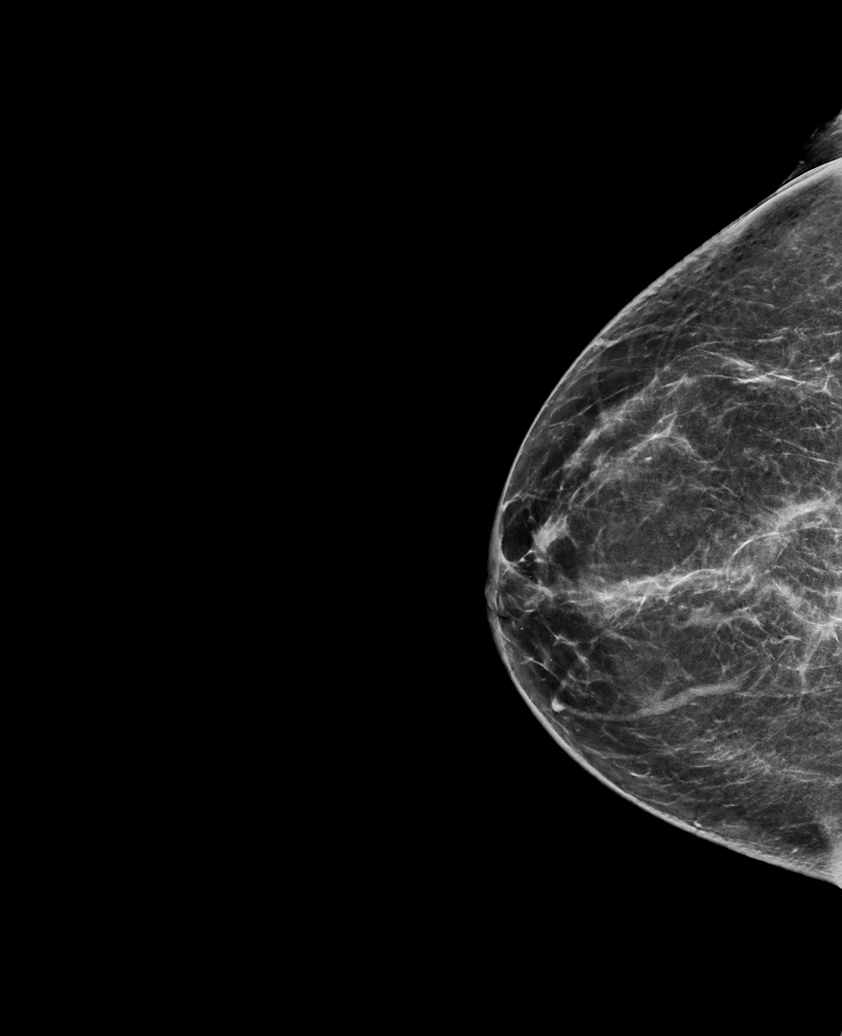

[R MLO synth-2D]
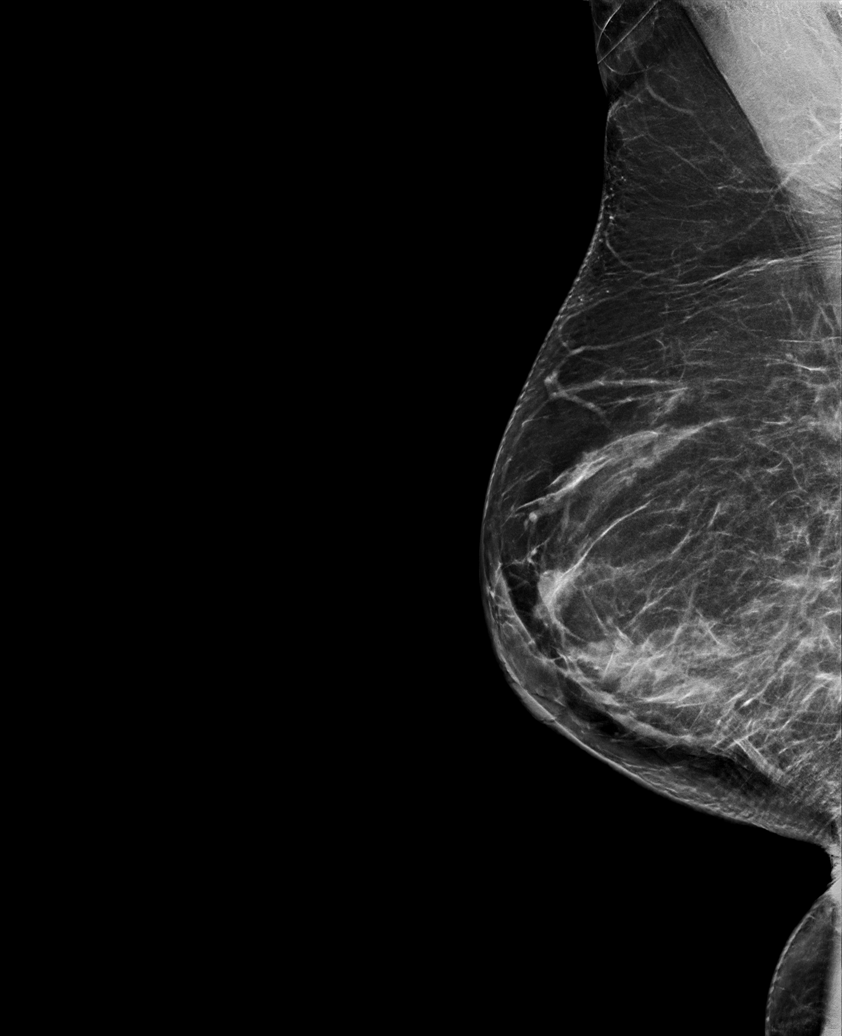

[L CC synth-2D]
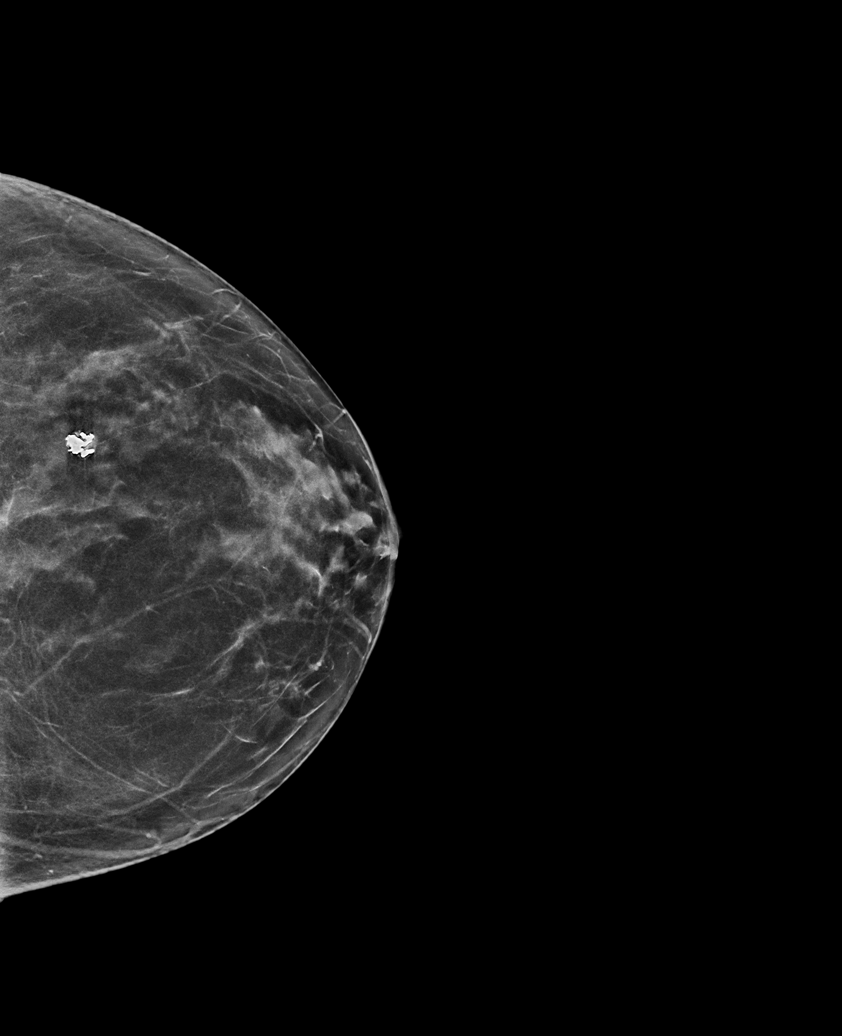

[L CC tomo · tomo slice 33/64.0]
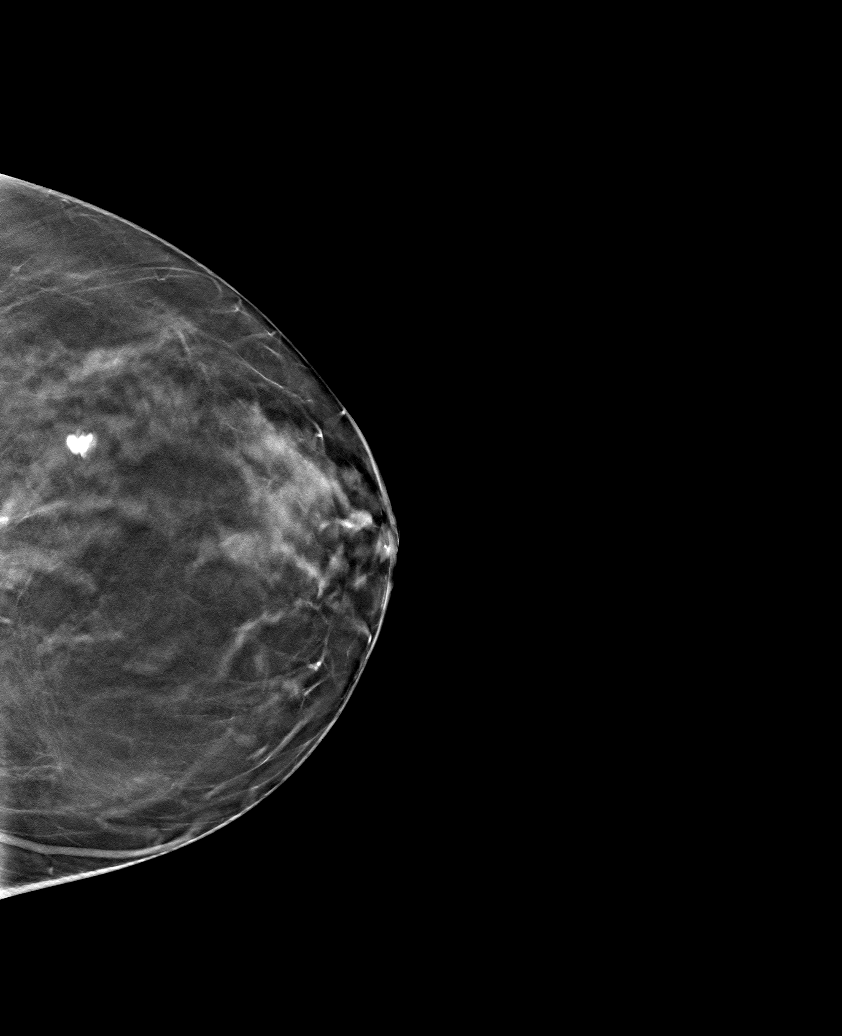

[6 of 25 positions shown; findings below may reference images not displayed]

ACR Breast Density Category b: There are scattered areas of
fibroglandular density.
FINDINGS: Examination demonstrates expected post lumpectomy changes over the
posterior third of the lower central right breast. Remainder of the
right breast as well as the left breast are unchanged.

Mammographic images were processed with CAD.
IMPRESSION: Expected post lumpectomy changes of the right breast.

RECOMMENDATION:
Recommend continued annual bilateral diagnostic mammographic
evaluation.

I have discussed the findings and recommendations with the patient.
Results were also provided in writing at the conclusion of the
visit. If applicable, a reminder letter will be sent to the patient
regarding the next appointment.

BI-RADS CATEGORY  2: Benign.

## 2018-12-10 ENCOUNTER — Ambulatory Visit: Payer: PPO | Admitting: Oncology

## 2018-12-10 DIAGNOSIS — J3089 Other allergic rhinitis: Secondary | ICD-10-CM | POA: Diagnosis not present

## 2018-12-10 DIAGNOSIS — J3081 Allergic rhinitis due to animal (cat) (dog) hair and dander: Secondary | ICD-10-CM | POA: Diagnosis not present

## 2018-12-10 DIAGNOSIS — J301 Allergic rhinitis due to pollen: Secondary | ICD-10-CM | POA: Diagnosis not present

## 2018-12-11 DIAGNOSIS — H1045 Other chronic allergic conjunctivitis: Secondary | ICD-10-CM | POA: Diagnosis not present

## 2018-12-11 DIAGNOSIS — J3081 Allergic rhinitis due to animal (cat) (dog) hair and dander: Secondary | ICD-10-CM | POA: Diagnosis not present

## 2018-12-11 DIAGNOSIS — J3089 Other allergic rhinitis: Secondary | ICD-10-CM | POA: Diagnosis not present

## 2018-12-11 DIAGNOSIS — J301 Allergic rhinitis due to pollen: Secondary | ICD-10-CM | POA: Diagnosis not present

## 2018-12-13 ENCOUNTER — Ambulatory Visit: Payer: PPO | Admitting: Oncology

## 2018-12-17 DIAGNOSIS — J3081 Allergic rhinitis due to animal (cat) (dog) hair and dander: Secondary | ICD-10-CM | POA: Diagnosis not present

## 2018-12-17 DIAGNOSIS — J301 Allergic rhinitis due to pollen: Secondary | ICD-10-CM | POA: Diagnosis not present

## 2018-12-17 DIAGNOSIS — J3089 Other allergic rhinitis: Secondary | ICD-10-CM | POA: Diagnosis not present

## 2018-12-24 DIAGNOSIS — J301 Allergic rhinitis due to pollen: Secondary | ICD-10-CM | POA: Diagnosis not present

## 2018-12-24 DIAGNOSIS — J3089 Other allergic rhinitis: Secondary | ICD-10-CM | POA: Diagnosis not present

## 2018-12-24 DIAGNOSIS — J3081 Allergic rhinitis due to animal (cat) (dog) hair and dander: Secondary | ICD-10-CM | POA: Diagnosis not present

## 2018-12-27 ENCOUNTER — Telehealth: Payer: Self-pay | Admitting: Oncology

## 2018-12-28 NOTE — Progress Notes (Signed)
5th  . HAMMER TOE SURGERY  09/20/2017  . JOINT REPLACEMENT     right knee  . ROTATOR CUFF REPAIR Right   . TONSILLECTOMY AND ADENOIDECTOMY  1963  . TOTAL KNEE ARTHROPLASTY Right 10/17/2016   Procedure: TOTAL KNEE ARTHROPLASTY;  Surgeon: Melrose Nakayama, MD;  Location: [place];  Service: Orthopedics;   Laterality: Right;  Marland Kitchen VAGINAL DELIVERY     3    FAMILY HISTORY: Family History  Problem Relation Age of Onset  . Arthritis Mother   . Heart disease Mother   . Hypertension Mother   . Arthritis Father   . Heart disease Father   . Hypertension Father   . Heart attack Father   . Stroke Maternal Grandmother   . Cancer Maternal Aunt        abdominal?  . Hyperlipidemia Maternal Aunt   . Diabetes Paternal Aunt   . Glaucoma Brother   . Hyperlipidemia Other   . Diabetes Other   . Breast cancer Neg Hx     ADVANCED DIRECTIVES (Y/N):  N  HEALTH MAINTENANCE: Social History   Tobacco Use  . Smoking status: Never Smoker  . Smokeless tobacco: Never Used  Substance Use Topics  . Alcohol use: No  . Drug use: No     Colonoscopy:  PAP:  Bone density:  Lipid panel:  Allergies  Allergen Reactions  . Lipitor [Atorvastatin] Other (See Comments)    Myalgia, arthralgia  . Simvastatin Other (See Comments)    myalgia    Current Outpatient Medications  Medication Sig Dispense Refill  . acebutolol (SECTRAL) 200 MG capsule Take 1 capsule (200 mg total) by mouth daily. 90 capsule 0  . acetaminophen (TYLENOL) 500 MG tablet Take 1,000 mg by mouth at bedtime.    Marland Kitchen aspirin EC 81 MG tablet Take 81 mg by mouth daily.    . Biotin (BIOTIN 5000) 5 MG CAPS Take 5 mg by mouth daily.     . calcium-vitamin D (OSCAL WITH D) 500-200 MG-UNIT tablet Take 1 tablet by mouth.    . Cholecalciferol (VITAMIN D-3) 1000 UNITS CAPS Take 1,000 Units by mouth every evening.     . fluticasone (FLONASE) 50 MCG/ACT nasal spray USE 1 TO 2 SPRAY(S) IN EACH NOSTRIL ONCE DAILY FOR 30 DAYS  3  . KRILL OIL PO Take 1 capsule by mouth daily.     Marland Kitchen letrozole (FEMARA) 2.5 MG tablet Take 1 tablet (2.5 mg total) by mouth daily. 90 tablet 3  . levocetirizine (XYZAL) 5 MG tablet TAKE 1 TABLET BY MOUTH ONCE DAILY IN THE EVENING FOR 30 DAYS  3  . LEVOXYL 75 MCG tablet Take 1 tablet by mouth once daily 90 tablet 2  . montelukast  (SINGULAIR) 10 MG tablet TAKE 1 TABLET BY MOUTH ONCE DAILY FOR 30 DAYS  3  . polyethylene glycol powder (GLYCOLAX/MIRALAX) powder Take 1 Container by mouth once.    . triamterene-hydrochlorothiazide (DYAZIDE) 37.5-25 MG capsule TAKE 1 CAPSULE BY MOUTH ONCE DAILY IN THE MORNING  3  . vitamin B-12 (CYANOCOBALAMIN) 1000 MCG tablet Take 1,000 mcg by mouth daily.     No current facility-administered medications for this visit.     OBJECTIVE: There were no vitals filed for this visit.   There is no height or weight on file to calculate BMI.    ECOG FS:0 - Asymptomatic  General: Well-developed, well-nourished, no acute distress. HEENT: Normocephalic. Neuro: Alert, answering all questions appropriately. Cranial nerves grossly intact. Skin: No rashes or petechiae

## 2018-12-30 ENCOUNTER — Other Ambulatory Visit: Payer: Self-pay

## 2018-12-30 ENCOUNTER — Inpatient Hospital Stay: Payer: PPO | Attending: Oncology | Admitting: Oncology

## 2018-12-30 ENCOUNTER — Encounter: Payer: Self-pay | Admitting: Oncology

## 2018-12-30 DIAGNOSIS — Z79899 Other long term (current) drug therapy: Secondary | ICD-10-CM | POA: Diagnosis not present

## 2018-12-30 DIAGNOSIS — D696 Thrombocytopenia, unspecified: Secondary | ICD-10-CM

## 2018-12-30 DIAGNOSIS — Z170 Estrogen receptor positive status [ER+]: Secondary | ICD-10-CM

## 2018-12-30 DIAGNOSIS — D0511 Intraductal carcinoma in situ of right breast: Secondary | ICD-10-CM | POA: Diagnosis not present

## 2018-12-30 NOTE — Progress Notes (Signed)
Patient denies any concerns today.  

## 2018-12-31 DIAGNOSIS — J3089 Other allergic rhinitis: Secondary | ICD-10-CM | POA: Diagnosis not present

## 2018-12-31 DIAGNOSIS — J3081 Allergic rhinitis due to animal (cat) (dog) hair and dander: Secondary | ICD-10-CM | POA: Diagnosis not present

## 2018-12-31 DIAGNOSIS — J301 Allergic rhinitis due to pollen: Secondary | ICD-10-CM | POA: Diagnosis not present

## 2019-01-06 DIAGNOSIS — J301 Allergic rhinitis due to pollen: Secondary | ICD-10-CM | POA: Diagnosis not present

## 2019-01-06 DIAGNOSIS — J3081 Allergic rhinitis due to animal (cat) (dog) hair and dander: Secondary | ICD-10-CM | POA: Diagnosis not present

## 2019-01-06 DIAGNOSIS — J3089 Other allergic rhinitis: Secondary | ICD-10-CM | POA: Diagnosis not present

## 2019-01-07 DIAGNOSIS — J301 Allergic rhinitis due to pollen: Secondary | ICD-10-CM | POA: Diagnosis not present

## 2019-01-07 DIAGNOSIS — J3081 Allergic rhinitis due to animal (cat) (dog) hair and dander: Secondary | ICD-10-CM | POA: Diagnosis not present

## 2019-01-07 DIAGNOSIS — J3089 Other allergic rhinitis: Secondary | ICD-10-CM | POA: Diagnosis not present

## 2019-01-14 DIAGNOSIS — J3081 Allergic rhinitis due to animal (cat) (dog) hair and dander: Secondary | ICD-10-CM | POA: Diagnosis not present

## 2019-01-14 DIAGNOSIS — J301 Allergic rhinitis due to pollen: Secondary | ICD-10-CM | POA: Diagnosis not present

## 2019-01-14 DIAGNOSIS — J3089 Other allergic rhinitis: Secondary | ICD-10-CM | POA: Diagnosis not present

## 2019-01-15 ENCOUNTER — Encounter: Payer: Self-pay | Admitting: Obstetrics & Gynecology

## 2019-01-15 ENCOUNTER — Other Ambulatory Visit: Payer: Self-pay

## 2019-01-15 ENCOUNTER — Ambulatory Visit (INDEPENDENT_AMBULATORY_CARE_PROVIDER_SITE_OTHER): Payer: PPO | Admitting: Obstetrics & Gynecology

## 2019-01-15 VITALS — BP 120/80 | Ht 60.0 in | Wt 157.0 lb

## 2019-01-15 DIAGNOSIS — N8111 Cystocele, midline: Secondary | ICD-10-CM

## 2019-01-15 DIAGNOSIS — N819 Female genital prolapse, unspecified: Secondary | ICD-10-CM

## 2019-01-15 NOTE — Progress Notes (Signed)
  HPI:      [name] is a 78 y.o. G3P3 who presents today for her pessary follow up and examination related to her pelvic floor weakening.  Pt reports tolerating the pessary well with  no vaginal bleeding and  no vaginal discharge.  Symptoms of pelvic floor weakening have greatly improved. She is voiding and defecating without difficulty. She currently has a #2 Ring pessary.  PMHx: She  has a past medical history of Allergy, Arthritis, Asthma, Breast cancer ([place]) (05/08/2017), Cataract, Chicken pox, Colon polyp, Constipation, Hyperlipidemia, Hypothyroidism, Palpitations, Personal history of radiation therapy, and Thyroid disease. Also,  has a past surgical history that includes Appendectomy (2012); Tonsillectomy and adenoidectomy (1963); Abdominal hysterectomy (1981); Vaginal delivery; Rotator cuff repair (Right); Finger fracture surgery (Left); Colonoscopy w/ polypectomy; Colonoscopy; Total knee arthroplasty (Right, 10/17/2016); Breast biopsy (Right, 05/08/2017); Joint replacement; Breast lumpectomy (Right, 06/07/2017); Breast lumpectomy (Right, 06/07/2017); Breast lumpectomy with sentinel lymph node bx (Right, 06/07/2017); and Hammer toe surgery (09/20/2017)., family history includes Arthritis in her father and mother; Cancer in her maternal aunt; Diabetes in her paternal aunt and another family member; Glaucoma in her brother; Heart attack in her father; Heart disease in her father and mother; Hyperlipidemia in her maternal aunt and another family member; Hypertension in her father and mother; Stroke in her maternal grandmother.,  reports that she has never smoked. She has never used smokeless tobacco. She reports that she does not drink alcohol or use drugs.  She has a current medication list which includes the following prescription(s): acebutolol, acetaminophen, aspirin ec, biotin, calcium-vitamin d, vitamin d-3, fluticasone, krill oil, letrozole, levocetirizine, levoxyl, montelukast,  polyethylene glycol powder, triamterene-hydrochlorothiazide, and vitamin b-12. Also, is allergic to lipitor [atorvastatin] and simvastatin.  Review of Systems  All other systems reviewed and are negative.   Objective: BP 120/80   Ht 5' (1.524 m)   Wt 157 lb (71.2 kg)   BMI 30.66 kg/m  Physical Exam Constitutional:      General: She is not in acute distress.    Appearance: She is well-developed.  Genitourinary:     Pelvic exam was performed with patient supine.     Vagina normal.     No vaginal erythema or bleeding.     Genitourinary Comments: Cuff intact/ no lesions Gr2Cystocele Absent uterus and cervix  HENT:     Head: Normocephalic and atraumatic.     Nose: Nose normal.  Abdominal:     General: There is no distension.     Palpations: Abdomen is soft.     Tenderness: There is no abdominal tenderness.  Musculoskeletal: Normal range of motion.  Neurological:     Mental Status: She is alert and oriented to person, place, and time.     Cranial Nerves: No cranial nerve deficit.  Skin:    General: Skin is warm and dry.     Pessary Care Pessary removed and cleaned.  Vagina checked - without erosions - pessary replaced.  A/P:1. Vaginal vault prolapse 2. Cystocele, midline  Pessary was cleaned and replaced today. Instructions given for care. Concerning symptoms to observe for are counseled to patient. Follow up scheduled for 3 months.  A total of 15 minutes were spent face-to-face with the patient during this encounter and over half of that time dealt with counseling and coordination of care.  Barnett Applebaum, MD, [facility], [facility] 01/15/2019  2:11 PM

## 2019-01-21 DIAGNOSIS — J301 Allergic rhinitis due to pollen: Secondary | ICD-10-CM | POA: Diagnosis not present

## 2019-01-21 DIAGNOSIS — J3089 Other allergic rhinitis: Secondary | ICD-10-CM | POA: Diagnosis not present

## 2019-01-21 DIAGNOSIS — J3081 Allergic rhinitis due to animal (cat) (dog) hair and dander: Secondary | ICD-10-CM | POA: Diagnosis not present

## 2019-01-28 DIAGNOSIS — J3089 Other allergic rhinitis: Secondary | ICD-10-CM | POA: Diagnosis not present

## 2019-01-28 DIAGNOSIS — J3081 Allergic rhinitis due to animal (cat) (dog) hair and dander: Secondary | ICD-10-CM | POA: Diagnosis not present

## 2019-01-28 DIAGNOSIS — J301 Allergic rhinitis due to pollen: Secondary | ICD-10-CM | POA: Diagnosis not present

## 2019-02-06 DIAGNOSIS — J301 Allergic rhinitis due to pollen: Secondary | ICD-10-CM | POA: Diagnosis not present

## 2019-02-06 DIAGNOSIS — J3089 Other allergic rhinitis: Secondary | ICD-10-CM | POA: Diagnosis not present

## 2019-02-11 DIAGNOSIS — J3089 Other allergic rhinitis: Secondary | ICD-10-CM | POA: Diagnosis not present

## 2019-02-11 DIAGNOSIS — J301 Allergic rhinitis due to pollen: Secondary | ICD-10-CM | POA: Diagnosis not present

## 2019-02-11 DIAGNOSIS — J3081 Allergic rhinitis due to animal (cat) (dog) hair and dander: Secondary | ICD-10-CM | POA: Diagnosis not present

## 2019-02-13 DIAGNOSIS — J301 Allergic rhinitis due to pollen: Secondary | ICD-10-CM | POA: Diagnosis not present

## 2019-02-13 DIAGNOSIS — J3081 Allergic rhinitis due to animal (cat) (dog) hair and dander: Secondary | ICD-10-CM | POA: Diagnosis not present

## 2019-02-13 DIAGNOSIS — J3089 Other allergic rhinitis: Secondary | ICD-10-CM | POA: Diagnosis not present

## 2019-02-18 DIAGNOSIS — J3081 Allergic rhinitis due to animal (cat) (dog) hair and dander: Secondary | ICD-10-CM | POA: Diagnosis not present

## 2019-02-18 DIAGNOSIS — J301 Allergic rhinitis due to pollen: Secondary | ICD-10-CM | POA: Diagnosis not present

## 2019-02-18 DIAGNOSIS — J3089 Other allergic rhinitis: Secondary | ICD-10-CM | POA: Diagnosis not present

## 2019-02-20 DIAGNOSIS — J3081 Allergic rhinitis due to animal (cat) (dog) hair and dander: Secondary | ICD-10-CM | POA: Diagnosis not present

## 2019-02-20 DIAGNOSIS — J3089 Other allergic rhinitis: Secondary | ICD-10-CM | POA: Diagnosis not present

## 2019-02-20 DIAGNOSIS — J301 Allergic rhinitis due to pollen: Secondary | ICD-10-CM | POA: Diagnosis not present

## 2019-02-25 DIAGNOSIS — J3081 Allergic rhinitis due to animal (cat) (dog) hair and dander: Secondary | ICD-10-CM | POA: Diagnosis not present

## 2019-02-25 DIAGNOSIS — J301 Allergic rhinitis due to pollen: Secondary | ICD-10-CM | POA: Diagnosis not present

## 2019-02-25 DIAGNOSIS — J3089 Other allergic rhinitis: Secondary | ICD-10-CM | POA: Diagnosis not present

## 2019-03-04 DIAGNOSIS — J3081 Allergic rhinitis due to animal (cat) (dog) hair and dander: Secondary | ICD-10-CM | POA: Diagnosis not present

## 2019-03-04 DIAGNOSIS — J3089 Other allergic rhinitis: Secondary | ICD-10-CM | POA: Diagnosis not present

## 2019-03-04 DIAGNOSIS — J301 Allergic rhinitis due to pollen: Secondary | ICD-10-CM | POA: Diagnosis not present

## 2019-03-11 ENCOUNTER — Encounter: Payer: Self-pay | Admitting: General Surgery

## 2019-03-11 ENCOUNTER — Encounter: Payer: Self-pay | Admitting: Family Medicine

## 2019-03-11 DIAGNOSIS — J3081 Allergic rhinitis due to animal (cat) (dog) hair and dander: Secondary | ICD-10-CM | POA: Diagnosis not present

## 2019-03-11 DIAGNOSIS — J301 Allergic rhinitis due to pollen: Secondary | ICD-10-CM | POA: Diagnosis not present

## 2019-03-11 DIAGNOSIS — J3089 Other allergic rhinitis: Secondary | ICD-10-CM | POA: Diagnosis not present

## 2019-03-12 ENCOUNTER — Encounter: Payer: Self-pay | Admitting: Family Medicine

## 2019-03-18 DIAGNOSIS — J3081 Allergic rhinitis due to animal (cat) (dog) hair and dander: Secondary | ICD-10-CM | POA: Diagnosis not present

## 2019-03-18 DIAGNOSIS — J3089 Other allergic rhinitis: Secondary | ICD-10-CM | POA: Diagnosis not present

## 2019-03-18 DIAGNOSIS — J301 Allergic rhinitis due to pollen: Secondary | ICD-10-CM | POA: Diagnosis not present

## 2019-03-25 DIAGNOSIS — J3081 Allergic rhinitis due to animal (cat) (dog) hair and dander: Secondary | ICD-10-CM | POA: Diagnosis not present

## 2019-03-25 DIAGNOSIS — J301 Allergic rhinitis due to pollen: Secondary | ICD-10-CM | POA: Diagnosis not present

## 2019-03-25 DIAGNOSIS — J3089 Other allergic rhinitis: Secondary | ICD-10-CM | POA: Diagnosis not present

## 2019-04-01 DIAGNOSIS — J301 Allergic rhinitis due to pollen: Secondary | ICD-10-CM | POA: Diagnosis not present

## 2019-04-01 DIAGNOSIS — J3081 Allergic rhinitis due to animal (cat) (dog) hair and dander: Secondary | ICD-10-CM | POA: Diagnosis not present

## 2019-04-01 DIAGNOSIS — J3089 Other allergic rhinitis: Secondary | ICD-10-CM | POA: Diagnosis not present

## 2019-04-08 DIAGNOSIS — J301 Allergic rhinitis due to pollen: Secondary | ICD-10-CM | POA: Diagnosis not present

## 2019-04-08 DIAGNOSIS — J3089 Other allergic rhinitis: Secondary | ICD-10-CM | POA: Diagnosis not present

## 2019-04-08 DIAGNOSIS — J3081 Allergic rhinitis due to animal (cat) (dog) hair and dander: Secondary | ICD-10-CM | POA: Diagnosis not present

## 2019-04-14 ENCOUNTER — Encounter: Payer: Self-pay | Admitting: Family Medicine

## 2019-04-15 DIAGNOSIS — J3081 Allergic rhinitis due to animal (cat) (dog) hair and dander: Secondary | ICD-10-CM | POA: Diagnosis not present

## 2019-04-15 DIAGNOSIS — J3089 Other allergic rhinitis: Secondary | ICD-10-CM | POA: Diagnosis not present

## 2019-04-15 DIAGNOSIS — J301 Allergic rhinitis due to pollen: Secondary | ICD-10-CM | POA: Diagnosis not present

## 2019-04-16 ENCOUNTER — Telehealth: Payer: Self-pay

## 2019-04-16 NOTE — Telephone Encounter (Signed)
There was confusion on mychart about this patient's visit with the provider as well as the health coach, so I calle dand got her and her husbands situations handled and patient understood.  Cathlene Gardella,cma

## 2019-04-21 ENCOUNTER — Encounter: Payer: Self-pay | Admitting: Family Medicine

## 2019-04-21 ENCOUNTER — Other Ambulatory Visit: Payer: Self-pay

## 2019-04-21 ENCOUNTER — Ambulatory Visit (INDEPENDENT_AMBULATORY_CARE_PROVIDER_SITE_OTHER): Payer: PPO | Admitting: Family Medicine

## 2019-04-21 ENCOUNTER — Ambulatory Visit: Payer: PPO | Admitting: Obstetrics & Gynecology

## 2019-04-21 ENCOUNTER — Ambulatory Visit: Payer: PPO

## 2019-04-21 VITALS — BP 115/60 | HR 62 | Temp 98.6°F | Ht 60.0 in | Wt 158.8 lb

## 2019-04-21 DIAGNOSIS — E782 Mixed hyperlipidemia: Secondary | ICD-10-CM | POA: Diagnosis not present

## 2019-04-21 DIAGNOSIS — E039 Hypothyroidism, unspecified: Secondary | ICD-10-CM | POA: Diagnosis not present

## 2019-04-21 DIAGNOSIS — L237 Allergic contact dermatitis due to plants, except food: Secondary | ICD-10-CM | POA: Insufficient documentation

## 2019-04-21 DIAGNOSIS — J309 Allergic rhinitis, unspecified: Secondary | ICD-10-CM | POA: Diagnosis not present

## 2019-04-21 DIAGNOSIS — I10 Essential (primary) hypertension: Secondary | ICD-10-CM

## 2019-04-21 DIAGNOSIS — D0511 Intraductal carcinoma in situ of right breast: Secondary | ICD-10-CM

## 2019-04-21 DIAGNOSIS — R7303 Prediabetes: Secondary | ICD-10-CM

## 2019-04-21 LAB — COMPREHENSIVE METABOLIC PANEL WITH GFR
ALT: 14 U/L (ref 0–35)
AST: 17 U/L (ref 0–37)
Albumin: 4.1 g/dL (ref 3.5–5.2)
Alkaline Phosphatase: 53 U/L (ref 39–117)
BUN: 20 mg/dL (ref 6–23)
CO2: 30 meq/L (ref 19–32)
Calcium: 10.3 mg/dL (ref 8.4–10.5)
Chloride: 106 meq/L (ref 96–112)
Creatinine, Ser: 1.15 mg/dL (ref 0.40–1.20)
GFR: 45.84 mL/min — ABNORMAL LOW
Glucose, Bld: 85 mg/dL (ref 70–99)
Potassium: 4.4 meq/L (ref 3.5–5.1)
Sodium: 142 meq/L (ref 135–145)
Total Bilirubin: 0.6 mg/dL (ref 0.2–1.2)
Total Protein: 6.4 g/dL (ref 6.0–8.3)

## 2019-04-21 LAB — LIPID PANEL
Cholesterol: 198 mg/dL (ref 0–200)
HDL: 42.6 mg/dL
NonHDL: 155.79
Total CHOL/HDL Ratio: 5
Triglycerides: 211 mg/dL — ABNORMAL HIGH (ref 0.0–149.0)
VLDL: 42.2 mg/dL — ABNORMAL HIGH (ref 0.0–40.0)

## 2019-04-21 LAB — HEMOGLOBIN A1C: Hgb A1c MFr Bld: 5.3 % (ref 4.6–6.5)

## 2019-04-21 LAB — TSH: TSH: 2.23 u[IU]/mL (ref 0.35–4.50)

## 2019-04-21 LAB — LDL CHOLESTEROL, DIRECT: Direct LDL: 131 mg/dL

## 2019-04-21 MED ORDER — TRIAMCINOLONE ACETONIDE 0.1 % EX CREA: 1.0000 "application " | TOPICAL_CREAM | Freq: Two times a day (BID) | CUTANEOUS | 0 refills | Status: DC

## 2019-04-21 NOTE — Progress Notes (Signed)
  Tommi Rumps, MD Phone: [phone]  Gwendolyn King is a 78 y.o. female who presents today for follow-up.  Hypertension: Taking acebutolol.  No chest pain or shortness of breath.  Rare edema though has not had any in the last month.  Hypothyroidism: Taking Levoxyl.  No skin changes.  No heat or cold intolerance.  Allergic rhinitis: She gets allergy shots weekly.  She continues on Singulair and Xyzal.  Takes Flonase as needed.  She occasionally has postnasal drip.  History of breast cancer: Patient continues to follow with oncology, radiation oncology, and general surgery.  She remains on letrozole.  Overall doing well with this.  Has a mammogram scheduled for October.  Rash: Patient thinks she got into poison ivy.  She has spots on her left lateral lower leg and also around both of her ankles.  She notes they itch quite a bit.  She has been using zanfel cream for this.  Social History   Tobacco Use  Smoking Status Never Smoker  Smokeless Tobacco Never Used     ROS see history of present illness  Objective  Physical Exam Vitals:   04/21/19 1004  BP: 115/60  Pulse: 62  Temp: 98.6 F (37 C)  SpO2: 98%    BP Readings from Last 3 Encounters:  04/21/19 115/60  01/15/19 120/80  10/14/18 130/80   Wt Readings from Last 3 Encounters:  04/21/19 158 lb 12.8 oz (72 kg)  01/15/19 157 lb (71.2 kg)  10/14/18 158 lb (71.7 kg)    Physical Exam Constitutional:      General: She is not in acute distress.    Appearance: She is not diaphoretic.  Cardiovascular:     Rate and Rhythm: Normal rate and regular rhythm.     Heart sounds: Normal heart sounds.  Pulmonary:     Effort: Pulmonary effort is normal.     Breath sounds: Normal breath sounds.  Musculoskeletal:     Right lower leg: No edema.     Left lower leg: No edema.  Skin:    General: Skin is warm and dry.       Neurological:     Mental Status: She is alert.      Assessment/Plan: Please see individual problem  list.  Hypertension Adequately controlled.  Continue current regimen.  Hypothyroidism Continue Levoxyl.  Check labs.  Allergic rhinitis Stable.  She will continue to see her allergist.  Continue current regimen.  Ductal carcinoma in situ (DCIS) of right breast She will continue to follow with oncology, radiation oncology, and general surgery.  Hyperlipidemia Check lipid panel.  Poison ivy Rash is suspicious for poison ivy.  Will treat with topical triamcinolone.  If not improving she will let us know.  If the steroid cream is too expensive she will let us know.   Health Maintenance: Patient declines flu vaccine.  Orders Placed This Encounter  Procedures  . Lipid panel  . Comp Met (CMET)  . HgB A1c  . TSH    Meds ordered this encounter  Medications  . triamcinolone cream (KENALOG) 0.1 %    Sig: Apply 1 application topically 2 (two) times daily.    Dispense:  30 g    Refill:  0     Tommi Rumps, MD [place]

## 2019-04-21 NOTE — Assessment & Plan Note (Signed)
She will continue to follow with oncology, radiation oncology, and general surgery.

## 2019-04-21 NOTE — Assessment & Plan Note (Signed)
Continue Levoxyl.  Check labs.

## 2019-04-21 NOTE — Assessment & Plan Note (Signed)
Rash is suspicious for poison ivy.  Will treat with topical triamcinolone.  If not improving she will let us know.  If the steroid cream is too expensive she will let us know.

## 2019-04-21 NOTE — Assessment & Plan Note (Signed)
Adequately controlled.  Continue current regimen. 

## 2019-04-21 NOTE — Assessment & Plan Note (Signed)
Stable.  She will continue to see her allergist.  Continue current regimen.

## 2019-04-21 NOTE — Assessment & Plan Note (Signed)
Check lipid panel  

## 2019-04-21 NOTE — Patient Instructions (Signed)
Nice to see you. We will check lab work today and contact you with results. Please try the steroid cream for your rash.

## 2019-04-22 ENCOUNTER — Ambulatory Visit: Payer: PPO | Admitting: Obstetrics & Gynecology

## 2019-04-22 DIAGNOSIS — J3081 Allergic rhinitis due to animal (cat) (dog) hair and dander: Secondary | ICD-10-CM | POA: Diagnosis not present

## 2019-04-22 DIAGNOSIS — J301 Allergic rhinitis due to pollen: Secondary | ICD-10-CM | POA: Diagnosis not present

## 2019-04-22 DIAGNOSIS — J3089 Other allergic rhinitis: Secondary | ICD-10-CM | POA: Diagnosis not present

## 2019-04-24 ENCOUNTER — Telehealth: Payer: Self-pay

## 2019-04-24 ENCOUNTER — Other Ambulatory Visit: Payer: Self-pay | Admitting: Family Medicine

## 2019-04-24 DIAGNOSIS — E782 Mixed hyperlipidemia: Secondary | ICD-10-CM

## 2019-04-24 MED ORDER — EZETIMIBE 10 MG PO TABS: 10.0000 mg | ORAL_TABLET | Freq: Every day | ORAL | 3 refills | Status: DC

## 2019-04-24 NOTE — Telephone Encounter (Signed)
Copied from [place] [id number]. Topic: General - Other >> Apr 24, 2019  9:44 AM Pauline Good wrote: Reason for CRM: pt need call back from nurse to discuss Lipitor. Please call pt

## 2019-04-25 NOTE — Telephone Encounter (Signed)
Left message for patient to return call to office. 

## 2019-04-29 DIAGNOSIS — J301 Allergic rhinitis due to pollen: Secondary | ICD-10-CM | POA: Diagnosis not present

## 2019-04-29 DIAGNOSIS — J3081 Allergic rhinitis due to animal (cat) (dog) hair and dander: Secondary | ICD-10-CM | POA: Diagnosis not present

## 2019-04-29 DIAGNOSIS — J3089 Other allergic rhinitis: Secondary | ICD-10-CM | POA: Diagnosis not present

## 2019-05-01 ENCOUNTER — Telehealth: Payer: Self-pay

## 2019-05-01 ENCOUNTER — Ambulatory Visit (INDEPENDENT_AMBULATORY_CARE_PROVIDER_SITE_OTHER): Payer: PPO | Admitting: Obstetrics & Gynecology

## 2019-05-01 ENCOUNTER — Other Ambulatory Visit: Payer: Self-pay

## 2019-05-01 ENCOUNTER — Encounter: Payer: Self-pay | Admitting: Obstetrics & Gynecology

## 2019-05-01 VITALS — BP 128/80 | Ht 60.05 in | Wt 160.0 lb

## 2019-05-01 DIAGNOSIS — N8111 Cystocele, midline: Secondary | ICD-10-CM

## 2019-05-01 DIAGNOSIS — N819 Female genital prolapse, unspecified: Secondary | ICD-10-CM | POA: Diagnosis not present

## 2019-05-01 NOTE — Progress Notes (Signed)
HPI:      [name] is a 78 y.o. G3P3 who presents today for her pessary follow up and examination related to her pelvic floor weakening.  Pt reports tolerating the pessary well with  no vaginal bleeding and  no vaginal discharge.  Symptoms of pelvic floor weakening have greatly improved. She is voiding and defecating without difficulty. She currently has a Ring#2 pessary.  PMHx: She  has a past medical history of Allergy, Arthritis, Asthma, Breast cancer ([facility]) (05/08/2017), Cataract, Chicken pox, Colon polyp, Constipation, Hyperlipidemia, Hypothyroidism, Palpitations, Personal history of radiation therapy, and Thyroid disease. Also,  has a past surgical history that includes Appendectomy (2012); Tonsillectomy and adenoidectomy (1963); Abdominal hysterectomy (1981); Vaginal delivery; Rotator cuff repair (Right); Finger fracture surgery (Left); Colonoscopy w/ polypectomy; Colonoscopy; Total knee arthroplasty (Right, 10/17/2016); Breast biopsy (Right, 05/08/2017); Joint replacement; Breast lumpectomy (Right, 06/07/2017); Breast lumpectomy (Right, 06/07/2017); Breast lumpectomy with sentinel lymph node bx (Right, 06/07/2017); and Hammer toe surgery (09/20/2017)., family history includes Arthritis in her father and mother; Cancer in her maternal aunt; Diabetes in her paternal aunt and another family member; Glaucoma in her brother; Heart attack in her father; Heart disease in her father and mother; Hyperlipidemia in her maternal aunt and another family member; Hypertension in her father and mother; Stroke in her maternal grandmother.,  reports that she has never smoked. She has never used smokeless tobacco. She reports that she does not drink alcohol or use drugs.  She has a current medication list which includes the following prescription(s): acebutolol, acetaminophen, aspirin ec, biotin, calcium-vitamin d, vitamin d-3, ezetimibe, fluticasone, krill oil, letrozole, levocetirizine, levoxyl, montelukast,  polyethylene glycol powder, triamcinolone cream, triamterene-hydrochlorothiazide, and vitamin b-12. Also, is allergic to lipitor [atorvastatin] and simvastatin.  Review of Systems  All other systems reviewed and are negative.   Objective: BP 128/80    Ht 5' 0.05" (1.525 m)    Wt 160 lb (72.6 kg)    BMI 31.20 kg/m  Physical Exam Constitutional:      General: She is not in acute distress.    Appearance: She is well-developed.  Genitourinary:     Pelvic exam was performed with patient supine.     Vagina normal.     No vaginal erythema or bleeding.     Genitourinary Comments: Cuff intact/ no lesions Gr 2 cystocele Absent uterus and cervix  HENT:     Head: Normocephalic and atraumatic.     Nose: Nose normal.  Abdominal:     General: There is no distension.     Palpations: Abdomen is soft.     Tenderness: There is no abdominal tenderness.  Musculoskeletal: Normal range of motion.  Neurological:     Mental Status: She is alert and oriented to person, place, and time.     Cranial Nerves: No cranial nerve deficit.  Skin:    General: Skin is warm and dry.     Pessary Care Pessary removed and cleaned.  Vagina checked - without erosions - pessary replaced.  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 Cystocele, midline  N81.11    Pessary was cleaned and replaced today. Instructions given for care. Concerning symptoms to observe for are counseled to patient. Follow up scheduled for 3 months.   A total of 15 minutes were spent face-to-face with the patient during this encounter and over half of that time dealt with counseling and coordination of care.  Barnett Applebaum, MD, [facility],  [facility] 05/01/2019  11:36 AM

## 2019-05-01 NOTE — Telephone Encounter (Signed)
Left a message to return a call.  Randeep Biondolillo,cma

## 2019-05-06 DIAGNOSIS — J301 Allergic rhinitis due to pollen: Secondary | ICD-10-CM | POA: Diagnosis not present

## 2019-05-06 DIAGNOSIS — J3081 Allergic rhinitis due to animal (cat) (dog) hair and dander: Secondary | ICD-10-CM | POA: Diagnosis not present

## 2019-05-06 DIAGNOSIS — J3089 Other allergic rhinitis: Secondary | ICD-10-CM | POA: Diagnosis not present

## 2019-05-06 NOTE — Telephone Encounter (Signed)
Called and left a voicemail informing the patient to use mychart to discuss lipitor becaue 3 attempts have been made to reach the patient by telephone.  Cully Luckow,cma

## 2019-05-07 ENCOUNTER — Other Ambulatory Visit: Payer: Self-pay | Admitting: Family Medicine

## 2019-05-07 MED ORDER — ACEBUTOLOL HCL 200 MG PO CAPS: 200.0000 mg | ORAL_CAPSULE | Freq: Every day | ORAL | 0 refills | Status: DC

## 2019-05-08 ENCOUNTER — Encounter: Payer: Self-pay | Admitting: Family Medicine

## 2019-05-13 ENCOUNTER — Other Ambulatory Visit: Payer: Self-pay

## 2019-05-13 ENCOUNTER — Ambulatory Visit (INDEPENDENT_AMBULATORY_CARE_PROVIDER_SITE_OTHER): Payer: PPO

## 2019-05-13 ENCOUNTER — Ambulatory Visit: Payer: PPO

## 2019-05-13 DIAGNOSIS — Z23 Encounter for immunization: Secondary | ICD-10-CM | POA: Diagnosis not present

## 2019-05-15 ENCOUNTER — Encounter: Payer: Self-pay | Admitting: Family Medicine

## 2019-05-15 DIAGNOSIS — J3081 Allergic rhinitis due to animal (cat) (dog) hair and dander: Secondary | ICD-10-CM | POA: Diagnosis not present

## 2019-05-15 DIAGNOSIS — J301 Allergic rhinitis due to pollen: Secondary | ICD-10-CM | POA: Diagnosis not present

## 2019-05-15 DIAGNOSIS — J3089 Other allergic rhinitis: Secondary | ICD-10-CM | POA: Diagnosis not present

## 2019-05-16 ENCOUNTER — Other Ambulatory Visit: Payer: Self-pay

## 2019-05-16 ENCOUNTER — Ambulatory Visit (INDEPENDENT_AMBULATORY_CARE_PROVIDER_SITE_OTHER): Payer: PPO

## 2019-05-16 DIAGNOSIS — Z0000 Encounter for general adult medical examination without abnormal findings: Secondary | ICD-10-CM

## 2019-05-16 NOTE — Progress Notes (Signed)
Subjective:   Gwendolyn King is a 78 y.o. female who presents for Medicare Annual (Subsequent) preventive examination.  Review of Systems:  No ROS.  Medicare Wellness Virtual Visit.  Visual/audio telehealth visit, UTA vital signs.   See social history for additional risk factors.   Cardiac Risk Factors include: advanced age (>74mn, >>37women);hypertension     Objective:     Vitals: There were no vitals taken for this visit.  There is no height or weight on file to calculate BMI.  Advanced Directives 05/16/2019 12/30/2018 10/03/2018 06/04/2018 04/18/2018 09/27/2017 08/23/2017  Does Patient Have a Medical Advance Directive? No No No No No No No  Would patient like information on creating a medical advance directive? No - Patient declined No - Patient declined No - Patient declined - No - Patient declined No - Patient declined -    Tobacco Social History   Tobacco Use  Smoking Status Never Smoker  Smokeless Tobacco Never Used     Counseling given: Not Answered   Clinical Intake:  Pre-visit preparation completed: Yes        Diabetes: No  How often do you need to have someone help you when you read instructions, pamphlets, or other written materials from your doctor or pharmacy?: 1 - Never  Interpreter Needed?: No     Past Medical History:  Diagnosis Date  . Allergy    hay fever, fall allergies  . Arthritis   . Asthma    as a child, dad was smoker  . 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  . Cataract    Bil  . Chicken pox   . Colon polyp   . Constipation   . Hyperlipidemia   . Hypothyroidism   . Palpitations    over 20 yeras ago  . Personal history of radiation therapy   . Thyroid disease    Past Surgical History:  Procedure Laterality Date  . ABDOMINAL HYSTERECTOMY  1981  . APPENDECTOMY  2012  . BREAST BIOPSY Right 05/08/2017   Affirm Bx-DUCTAL CARCINOMA IN SITU  (DCIS) WITH ONE FOCUS SUSPICIOUS FOR invasive  . BREAST LUMPECTOMY Right 06/07/2017   DUCTAL CARCINOMA IN SITU (DCIS)/24 radiation treatments  . BREAST LUMPECTOMY Right 06/07/2017   PAPILLARY LESION CONSISTENT WITH PAPILLARY CARCINOMA  . BREAST LUMPECTOMY WITH SENTINEL LYMPH NODE BIOPSY Right 06/07/2017   Wide excision of 2 foci of high-grade DCIS, microinvasion noted only on core biopsy. 2 microscopic foci lateral inferior margins. Patient elected not to proceed to reexcision.  . COLONOSCOPY    . COLONOSCOPY W/ POLYPECTOMY    . FINGER FRACTURE SURGERY Left    5th  . HAMMER TOE SURGERY  09/20/2017  . JOINT REPLACEMENT     right knee  . ROTATOR CUFF REPAIR Right   . TONSILLECTOMY AND ADENOIDECTOMY  1963  . TOTAL KNEE ARTHROPLASTY Right 10/17/2016   Procedure: TOTAL KNEE ARTHROPLASTY;  Surgeon: PMelrose Nakayama MD;  [facility]  Service: Orthopedics;  Laterality: Right;  .Marland KitchenVAGINAL DELIVERY     3   Family History  Problem Relation Age of Onset  . Arthritis Mother   . Heart disease Mother   . Hypertension Mother   . Arthritis Father   . Heart disease Father   . Hypertension Father   . Heart attack Father   . Stroke Maternal Grandmother   . Cancer Maternal Aunt  abdominal?  . Hyperlipidemia Maternal Aunt   . Diabetes Paternal Aunt   . Glaucoma Brother   . Hyperlipidemia Other   . Diabetes Other   . Breast cancer Neg Hx    Social History   Socioeconomic History  . Marital status: Married    Spouse name: Not on file  . Number of children: Not on file  . Years of education: Not on file  . Highest education level: Not on file  Occupational History  . Not on file  Social Needs  . Financial resource strain: Not hard at all  . Food insecurity    Worry: Never true    Inability: Never true  . Transportation needs    Medical: No    Non-medical: No  Tobacco Use  . Smoking status: Never Smoker  . Smokeless tobacco: Never Used  Substance and Sexual Activity  .  Alcohol use: No  . Drug use: No  . Sexual activity: Not on file  Lifestyle  . Physical activity    Days per week: 4 days    Minutes per session: 60 min  . Stress: Not at all  Relationships  . Social Herbalist on phone: Not on file    Gets together: Not on file    Attends religious service: Not on file    Active member of club or organization: Not on file    Attends meetings of clubs or organizations: Not on file    Relationship status: Not on file  Other Topics Concern  . Not on file  Social History Narrative   Lives with husband in [place]. 3 children.      Work - retired, Pharmacist, community, Pharmacist, hospital      Diet - regular, limits sugar, eats small meals   Exercise - Jazzercise 4 days per week    Outpatient Encounter Medications as of 05/16/2019  Medication Sig  . acebutolol (SECTRAL) 200 MG capsule Take 1 capsule (200 mg total) by mouth daily.  Marland Kitchen acetaminophen (TYLENOL) 500 MG tablet Take 1,000 mg by mouth at bedtime.  Marland Kitchen aspirin EC 81 MG tablet Take 81 mg by mouth daily.  . Biotin (BIOTIN 5000) 5 MG CAPS Take 5 mg by mouth daily.   . calcium-vitamin D (OSCAL WITH D) 500-200 MG-UNIT tablet Take 1 tablet by mouth.  . Cholecalciferol (VITAMIN D-3) 1000 UNITS CAPS Take 1,000 Units by mouth every evening.   . ezetimibe (ZETIA) 10 MG tablet Take 1 tablet (10 mg total) by mouth daily.  . fluticasone (FLONASE) 50 MCG/ACT nasal spray USE 1 TO 2 SPRAY(S) IN EACH NOSTRIL ONCE DAILY FOR 30 DAYS  . KRILL OIL PO Take 1 capsule by mouth daily.   Marland Kitchen letrozole (FEMARA) 2.5 MG tablet Take 1 tablet (2.5 mg total) by mouth daily.  Marland Kitchen levocetirizine (XYZAL) 5 MG tablet TAKE 1 TABLET BY MOUTH ONCE DAILY IN THE EVENING FOR 30 DAYS  . LEVOXYL 75 MCG tablet Take 1 tablet by mouth once daily  . montelukast (SINGULAIR) 10 MG tablet TAKE 1 TABLET BY MOUTH ONCE DAILY FOR 30 DAYS  . polyethylene glycol powder (GLYCOLAX/MIRALAX) powder Take 1 Container by mouth once.  . vitamin B-12  (CYANOCOBALAMIN) 1000 MCG tablet Take 1,000 mcg by mouth daily.  . [DISCONTINUED] triamcinolone cream (KENALOG) 0.1 % Apply 1 application topically 2 (two) times daily.  . [DISCONTINUED] triamterene-hydrochlorothiazide (DYAZIDE) 37.5-25 MG capsule TAKE 1 CAPSULE BY MOUTH ONCE DAILY IN THE MORNING   No facility-administered encounter medications on  file as of 05/16/2019.     Activities of Daily Living In your present state of health, do you have any difficulty performing the following activities: 05/16/2019  Hearing? Y  Comment Hearing aids  Vision? N  Difficulty concentrating or making decisions? N  Walking or climbing stairs? N  Dressing or bathing? N  Doing errands, shopping? N  Preparing Food and eating ? N  Using the Toilet? N  In the past six months, have you accidently leaked urine? N  Do you have problems with loss of bowel control? N  Managing your Medications? N  Managing your Finances? N  Housekeeping or managing your Housekeeping? N  Some recent data might be hidden    Patient Care Team: Leone Haven, MD as PCP - General (Family Medicine) Minna Merritts, MD as Consulting Physician (Cardiology) Bary Castilla Forest Gleason, MD (General Surgery)    Assessment:   This is a routine wellness examination for Coriann.  I connected with patient 05/16/19 at 11:30 AM EDT by an audio enabled telemedicine application and verified that I am speaking with the correct person using two identifiers. Patient stated full name and DOB. Patient gave permission to continue with virtual visit. Patient's location was at home and Nurse's location was at [place] office.   Health Maintenance Due: -Dexa Scan- scheduled 06/10/19 Update all pending maintenance due as appropriate.   See completed HM at the end of note.   Eye: Visual acuity not assessed. Virtual visit. Wears corrective lenses. Followed by their ophthalmologist every 12 months.   Dental: Visits every 6 months.    Hearing:  Hearing aids- yes  Safety:  Patient feels safe at home- yes Patient does have smoke detectors at home- yes Patient does wear sunscreen or protective clothing when in direct sunlight - yes Patient does wear seat belt when in a moving vehicle - yes Patient drives- yes Adequate lighting in walkways free from debris- yes Grab bars and handrails used as appropriate- yes Ambulates with no assistive device Cell phone on person when ambulating outside of the home- yes  Social: Alcohol intake - no    Smoking history- never   Smokers in home? none Illicit drug use? none  Depression: PHQ 2 &9 complete. See screening below. Denies irritability, anhedonia, sadness/tearfullness.  Stable.   Falls: See screening below.    Medication: Taking as directed and without issues.   Covid-19: Precautions and sickness symptoms discussed. Wears mask, social distancing, hand hygiene as appropriate.   Activities of Daily Living Patient denies needing assistance with: household chores, feeding themselves, getting from bed to chair, getting to the toilet, bathing/showering, dressing, managing money, or preparing meals.   Memory: Patient is alert. Patient denies difficulty focusing or concentrating. Correctly identified the president of the [place], season and recall. Patient likes to read, play sodoku and word search for brain stimulation.  BMI- discussed the importance of a healthy diet, water intake and the benefits of aerobic exercise.  Educational material provided.  Physical activity- bicycle 30 minutes twice weekly  Diet: Regular  Water: good intake Caffeine: 1-2 cups daily Ensure/Protein supplement: yes  Advanced Directive: End of life planning; Advanced aging; Advanced directives discussed.  No HCPOA/Living Will.  Additional information declined at this time.  Other Providers Patient Care Team: Leone Haven, MD as PCP - General (Family Medicine) Minna Merritts, MD as Consulting  Physician (Cardiology) Bary Castilla Forest Gleason, MD (General Surgery)  Exercise Activities and Dietary recommendations    Goals  Patient Stated   . Increase physical activity (pt-stated)     Standing/chair exercises      Other   . DIET - INCREASE WATER INTAKE       Fall Risk Fall Risk  05/16/2019 04/21/2019 04/18/2018 07/02/2017 03/23/2016  Falls in the past year? 0 0 No No No  Number falls in past yr: - 0 - - -  Injury with Fall? - 0 - - -  Follow up - Falls evaluation completed - - -   Timed Get Up and Go performed: no, virtual visit  Depression Screen PHQ 2/9 Scores 05/16/2019 04/21/2019 04/18/2018 07/02/2017  PHQ - 2 Score 0 0 0 0     Cognitive Function MMSE - Mini Mental State Exam 04/18/2018  Orientation to time 5  Orientation to Place 5  Registration 3  Attention/ Calculation 5  Recall 3  Language- name 2 objects 2  Language- repeat 1  Language- follow 3 step command 3  Language- read & follow direction 1  Write a sentence 1  Copy design 1  Total score 30     6CIT Screen 05/16/2019  What Year? 0 points  What month? 0 points  What time? 0 points  Count back from 20 0 points  Months in reverse 0 points  Repeat phrase 0 points  Total Score 0    Immunization History  Administered Date(s) Administered  . Fluad Quad(high Dose 65+) 05/13/2019  . Influenza Split 05/01/2012  . Influenza, High Dose Seasonal PF 05/22/2016, 05/11/2017, 05/22/2018  . Influenza,inj,Quad PF,6+ Mos 05/18/2014  . Influenza-Unspecified 05/23/2013  . Pneumococcal Conjugate-13 08/21/2010  . Pneumococcal Polysaccharide-23 04/10/2014  . Tdap 08/21/2010  . Zoster 07/05/2008  . Zoster Recombinat (Shingrix) 04/04/2019   Screening Tests Health Maintenance  Topic Date Due  . TETANUS/TDAP  08/21/2020  . COLONOSCOPY  03/21/2023  . INFLUENZA VACCINE  Completed  . DEXA SCAN  Completed  . PNA vac Low Risk Adult  Completed      Plan:   Keep all routine maintenance appointments.   Next  scheduled lab 06/19/19 @ 8:45  6 month follow up 10/24/19  Medicare Attestation I have personally reviewed: The patient's medical and social history Their use of alcohol, tobacco or illicit drugs Their current medications and supplements The patient's functional ability including ADLs,fall risks, home safety risks, cognitive, and hearing and visual impairment Diet and physical activities Evidence for depression   In addition, I have reviewed and discussed with patient certain preventive protocols, quality metrics, and best practice recommendations. A written personalized care plan for preventive services as well as general preventive health recommendations were provided to patient via mail.     Varney Biles, LPN  2/83/1517

## 2019-05-16 NOTE — Patient Instructions (Addendum)
  Gwendolyn King , Thank you for taking time to come for your Medicare Wellness Visit. I appreciate your ongoing commitment to your health goals. Please review the following plan we discussed and let me know if I can assist you in the future.   These are the goals we discussed: Goals      Patient Stated   . Increase physical activity (pt-stated)     Standing/chair exercises      Other   . DIET - INCREASE WATER INTAKE       This is a list of the screening recommended for you and due dates:  Health Maintenance  Topic Date Due  . Tetanus Vaccine  08/21/2020  . Colon Cancer Screening  03/21/2023  . Flu Shot  Completed  . DEXA scan (bone density measurement)  Completed  . Pneumonia vaccines  Completed

## 2019-05-20 DIAGNOSIS — J301 Allergic rhinitis due to pollen: Secondary | ICD-10-CM | POA: Diagnosis not present

## 2019-05-20 DIAGNOSIS — J3081 Allergic rhinitis due to animal (cat) (dog) hair and dander: Secondary | ICD-10-CM | POA: Diagnosis not present

## 2019-05-20 DIAGNOSIS — J3089 Other allergic rhinitis: Secondary | ICD-10-CM | POA: Diagnosis not present

## 2019-05-27 DIAGNOSIS — J3081 Allergic rhinitis due to animal (cat) (dog) hair and dander: Secondary | ICD-10-CM | POA: Diagnosis not present

## 2019-05-27 DIAGNOSIS — J3089 Other allergic rhinitis: Secondary | ICD-10-CM | POA: Diagnosis not present

## 2019-05-27 DIAGNOSIS — J301 Allergic rhinitis due to pollen: Secondary | ICD-10-CM | POA: Diagnosis not present

## 2019-06-03 DIAGNOSIS — J301 Allergic rhinitis due to pollen: Secondary | ICD-10-CM | POA: Diagnosis not present

## 2019-06-03 DIAGNOSIS — J3089 Other allergic rhinitis: Secondary | ICD-10-CM | POA: Diagnosis not present

## 2019-06-03 DIAGNOSIS — J3081 Allergic rhinitis due to animal (cat) (dog) hair and dander: Secondary | ICD-10-CM | POA: Diagnosis not present

## 2019-06-06 DIAGNOSIS — J3089 Other allergic rhinitis: Secondary | ICD-10-CM | POA: Diagnosis not present

## 2019-06-06 DIAGNOSIS — J301 Allergic rhinitis due to pollen: Secondary | ICD-10-CM | POA: Diagnosis not present

## 2019-06-06 DIAGNOSIS — J3081 Allergic rhinitis due to animal (cat) (dog) hair and dander: Secondary | ICD-10-CM | POA: Diagnosis not present

## 2019-06-10 ENCOUNTER — Ambulatory Visit
Admission: RE | Admit: 2019-06-10 | Discharge: 2019-06-10 | Disposition: A | Discharge status: Home / Self Care | Payer: PPO | Source: Ambulatory Visit | Attending: Oncology | Admitting: Oncology

## 2019-06-10 ENCOUNTER — Other Ambulatory Visit: Payer: Self-pay | Admitting: Oncology

## 2019-06-10 DIAGNOSIS — R921 Mammographic calcification found on diagnostic imaging of breast: Secondary | ICD-10-CM | POA: Diagnosis not present

## 2019-06-10 DIAGNOSIS — Z13820 Encounter for screening for osteoporosis: Secondary | ICD-10-CM | POA: Insufficient documentation

## 2019-06-10 DIAGNOSIS — D0511 Intraductal carcinoma in situ of right breast: Secondary | ICD-10-CM | POA: Diagnosis present

## 2019-06-10 DIAGNOSIS — J301 Allergic rhinitis due to pollen: Secondary | ICD-10-CM | POA: Diagnosis not present

## 2019-06-10 DIAGNOSIS — J3089 Other allergic rhinitis: Secondary | ICD-10-CM | POA: Diagnosis not present

## 2019-06-10 DIAGNOSIS — M810 Age-related osteoporosis without current pathological fracture: Secondary | ICD-10-CM | POA: Diagnosis not present

## 2019-06-10 DIAGNOSIS — Z780 Asymptomatic menopausal state: Secondary | ICD-10-CM | POA: Insufficient documentation

## 2019-06-10 DIAGNOSIS — J3081 Allergic rhinitis due to animal (cat) (dog) hair and dander: Secondary | ICD-10-CM | POA: Diagnosis not present

## 2019-06-10 DIAGNOSIS — R928 Other abnormal and inconclusive findings on diagnostic imaging of breast: Secondary | ICD-10-CM

## 2019-06-12 NOTE — Progress Notes (Signed)
Patient is coming in for follow up  Patient has some questions about her biopsy next week, she had a mammogram earlier this week and had some concerns and questions about it

## 2019-06-12 NOTE — Progress Notes (Signed)
[place]  [phone] [phone]  ID: Gwendolyn King OB: 03/02/1941  MR#: [id number]  AOZ#:[id number]  Patient Care Team: Leone Haven, MD as PCP - General (Family Medicine) Minna Merritts, MD as Consulting Physician (Cardiology) Bary Castilla, Forest Gleason, MD (General Surgery)  CHIEF COMPLAINT: DCIS, right breast.  INTERVAL HISTORY: Patient returns to clinic today for routine 87-monthevaluation.  She had a recent mammogram on June 10, 2019 that revealed an abnormality in her right breast and she has a biopsy scheduled early next week.  She is anxious, but otherwise feels well.  She continues to tolerate letrozole without significant side effects.  She has no neurologic complaints.  She denies any recent fevers or illnesses.  She has a good appetite and denies weight loss.  She denies any chest pain, shortness of breath, cough, or hemoptysis.  She denies any nausea, vomiting, constipation, or diarrhea.  She has no urinary complaints.  Patient offers no further specific complaints today.  REVIEW OF SYSTEMS:   Review of Systems  Constitutional: Negative.  Negative for diaphoresis, fever, malaise/fatigue and weight loss.  Respiratory: Negative.  Negative for cough and shortness of breath.   Cardiovascular: Negative.  Negative for chest pain and leg swelling.  Gastrointestinal: Negative.  Negative for abdominal pain and constipation.  Genitourinary: Negative.  Negative for dysuria.  Musculoskeletal: Negative.  Negative for back pain.  Skin: Negative.  Negative for rash.  Neurological: Negative.  Negative for sensory change, focal weakness and weakness.  Psychiatric/Behavioral: The patient is nervous/anxious.     As per HPI. Otherwise, a complete review of systems is negative.  PAST MEDICAL HISTORY: Past Medical History:  Diagnosis Date  . Allergy    hay fever, fall allergies  . Arthritis   . Asthma    as a child, dad was smoker  . 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  . Cataract    Bil  . Chicken pox   . Colon polyp   . Constipation   . Hyperlipidemia   . Hypothyroidism   . Palpitations    over 20 yeras ago  . Personal history of radiation therapy   . Thyroid disease     PAST SURGICAL HISTORY: Past Surgical History:  Procedure Laterality Date  . ABDOMINAL HYSTERECTOMY  1981  . APPENDECTOMY  2012  . 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 BREAST BIOPSY Right 05/17/2017   uKoreabx done at DR. Brynetts office, papillary carcinoma  . BREAST LUMPECTOMY Right 06/07/2017   DUCTAL CARCINOMA IN SITU (DCIS) with microinvasion at 7:00 5 cmfn  . BREAST LUMPECTOMY Right 06/07/2017   PAPILLARY LESION CONSISTENT WITH PAPILLARY CARCINOMA at retroaerolar 7:00 1.5 cmfn  . BREAST LUMPECTOMY WITH SENTINEL LYMPH NODE BIOPSY Right 06/07/2017   Wide excision of 2 foci of high-grade DCIS, microinvasion noted only on core biopsy. 2 microscopic foci lateral inferior margins. Patient elected not to proceed to reexcision.  . COLONOSCOPY    . COLONOSCOPY W/ POLYPECTOMY    . FINGER FRACTURE SURGERY Left    5th  . HAMMER TOE SURGERY  09/20/2017  . JOINT REPLACEMENT     right knee  . ROTATOR CUFF REPAIR Right   . TONSILLECTOMY AND ADENOIDECTOMY  1963  . TOTAL KNEE ARTHROPLASTY Right 10/17/2016   Procedure: TOTAL KNEE ARTHROPLASTY;  Surgeon: PMelrose Nakayama  MD;  Location: [facility];  Service: Orthopedics;  Laterality: Right;  Marland Kitchen VAGINAL DELIVERY     3    FAMILY HISTORY: Family History  Problem Relation Age of Onset  . Arthritis Mother   . Heart disease Mother   . Hypertension Mother   . Arthritis Father   . Heart disease Father   . Hypertension Father   . Heart attack Father   . Stroke Maternal Grandmother   . Cancer Maternal Aunt        abdominal?  . Hyperlipidemia  Maternal Aunt   . Diabetes Paternal Aunt   . Glaucoma Brother   . Hyperlipidemia Other   . Diabetes Other   . Breast cancer Neg Hx     ADVANCED DIRECTIVES (Y/N):  N  HEALTH MAINTENANCE: Social History   Tobacco Use  . Smoking status: Never Smoker  . Smokeless tobacco: Never Used  Substance Use Topics  . Alcohol use: No  . Drug use: No     Colonoscopy:  PAP:  Bone density:  Lipid panel:  Allergies  Allergen Reactions  . Lipitor [Atorvastatin] Other (See Comments)    Myalgia, arthralgia  . Simvastatin Other (See Comments)    myalgia    Current Outpatient Medications  Medication Sig Dispense Refill  . acebutolol (SECTRAL) 200 MG capsule Take 1 capsule (200 mg total) by mouth daily. 90 capsule 0  . acetaminophen (TYLENOL) 500 MG tablet Take 1,000 mg by mouth at bedtime.    Marland Kitchen aspirin EC 81 MG tablet Take 81 mg by mouth daily.    . Biotin (BIOTIN 5000) 5 MG CAPS Take 5 mg by mouth daily.     . calcium-vitamin D (OSCAL WITH D) 500-200 MG-UNIT tablet Take 1 tablet by mouth.    . Cholecalciferol (VITAMIN D-3) 1000 UNITS CAPS Take 1,000 Units by mouth every evening.     . ezetimibe (ZETIA) 10 MG tablet Take 1 tablet (10 mg total) by mouth daily. 90 tablet 3  . fluticasone (FLONASE) 50 MCG/ACT nasal spray USE 1 TO 2 SPRAY(S) IN EACH NOSTRIL ONCE DAILY FOR 30 DAYS  3  . KRILL OIL PO Take 1 capsule by mouth daily.     Marland Kitchen letrozole (FEMARA) 2.5 MG tablet Take 1 tablet (2.5 mg total) by mouth daily. 90 tablet 3  . levocetirizine (XYZAL) 5 MG tablet TAKE 1 TABLET BY MOUTH ONCE DAILY IN THE EVENING FOR 30 DAYS  3  . LEVOXYL 75 MCG tablet Take 1 tablet by mouth once daily 90 tablet 2  . montelukast (SINGULAIR) 10 MG tablet TAKE 1 TABLET BY MOUTH ONCE DAILY FOR 30 DAYS  3  . polyethylene glycol powder (GLYCOLAX/MIRALAX) powder Take 1 Container by mouth once.    . vitamin B-12 (CYANOCOBALAMIN) 1000 MCG tablet Take 1,000 mcg by mouth daily.    Marland Kitchen alendronate (FOSAMAX) 35 MG tablet Take 2  tablets (70 mg total) by mouth every 7 (seven) days. Take with a full glass of water on an empty stomach. 8 tablet 12   No current facility-administered medications for this visit.     OBJECTIVE: Vitals:   06/13/19 1413  BP: (!) 155/48  Pulse: 63  Temp: 98 F (36.7 C)     Body mass index is 29.29 kg/m.    ECOG FS:0 - Asymptomatic  General: Well-developed, well-nourished, no acute distress. Eyes: Pink conjunctiva, anicteric sclera. HEENT: Normocephalic, moist mucous membranes. Breast: Patient declined breast exam today. Lungs: Clear to auscultation bilaterally. Heart: Regular rate and rhythm.  No rubs, murmurs, or gallops. Abdomen: Soft, nontender, nondistended. No organomegaly noted, normoactive bowel sounds. Musculoskeletal: No edema, cyanosis, or clubbing. Neuro: Alert, answering all questions appropriately. Cranial nerves grossly intact. Skin: No rashes or petechiae noted. Psych: Normal affect.  LAB RESULTS:  Lab Results  Component Value Date   NA 142 04/21/2019   K 4.4 04/21/2019   CL 106 04/21/2019   CO2 30 04/21/2019   GLUCOSE 85 04/21/2019   BUN 20 04/21/2019   CREATININE 1.15 04/21/2019   CALCIUM 10.3 04/21/2019   PROT 6.4 04/21/2019   ALBUMIN 4.1 04/21/2019   AST 17 04/21/2019   ALT 14 04/21/2019   ALKPHOS 53 04/21/2019   BILITOT 0.6 04/21/2019   GFRNONAA 42 (L) 06/01/2017   GFRAA 49 (L) 06/01/2017    Lab Results  Component Value Date   WBC 4.8 08/15/2017   NEUTROABS 2.6 06/01/2017   HGB 14.2 08/15/2017   HCT 41.9 08/15/2017   MCV 87.0 08/15/2017   PLT 94 (L) 08/15/2017     STUDIES: Dg Bone Density  Result Date: 06/10/2019 EXAM: DUAL X-RAY ABSORPTIOMETRY (DXA) FOR BONE MINERAL DENSITY IMPRESSION: Technologist:VLM Your patient Nathalie Cavendish completed a BMD test on 06/10/2019 using the [place] (analysis version: 14.10) manufactured by [employer]. The following summarizes the results of our evaluation. PATIENT BIOGRAPHICAL: Name:  Janesa, Dockery Patient ID:  [id number] Birth Date: December 04, 1940 Height:     60.5 in. Gender:      Female Exam Date:  06/10/2019 Weight:     157.0 lbs. Indications: Hysterectomy, Breast CA, Hypothyroid, High Risk Meds, Postmenopausal, Advanced Age, Caucasian Fractures: Treatments: calcium w/ vit D, Letrozole, LEVOTHYROXINE, Vitamin D ASSESSMENT: The BMD measured at Forearm Radius 33% is 0.643 g/cm2 with a T-score of -2.7. This patient is considered OSTEOPOROTIC according to [facility]) criteria. Lumbar spine was not utilized due to advanced degenerative changes. The scan quality is good. Site Region Measured Measured WHO Young Adult BMD Date       Age      Classification T-score DualFemur Neck Right 06/10/2019 76.4 Osteopenia -1.5 0.823 g/cm2 DualFemur Neck Right 06/06/2018 75.3 Osteopenia -1.6 0.817 g/cm2 DualFemur Neck Right 05/17/2016 73.3 Osteopenia -1.6 0.821 g/cm2 DualFemur Total Mean 06/10/2019 76.4 Normal -0.9 0.893 g/cm2 DualFemur Total Mean 06/06/2018 75.3 Normal -0.9 0.890 g/cm2 DualFemur Total Mean 05/17/2016 73.3 Normal -0.8 0.903 g/cm2 Left Forearm Radius 33% 06/10/2019 76.4 Osteoporosis -2.7 0.643 g/cm2 Left Forearm Radius 33% 06/06/2018 75.3 Osteopenia -2.4 0.666 g/cm2 Left Forearm Radius 33% 05/17/2016 73.3 Osteopenia -2.0 0.702 g/cm2 World Health Organization [facility]) criteria for post-menopausal, Caucasian Women: Normal:       T-score at or above -1 SD Osteopenia:   T-score between -1 and -2.5 SD Osteoporosis: T-score at or below -2.5 SD RECOMMENDATIONS: 1. All patients should optimize calcium and vitamin D intake. 2. Consider FDA-approved medical therapies in postmenopausal women and men aged 43 years and older, based on the following: a. A hip or vertebral(clinical or morphometric) fracture b. T-score < -2.5 at the femoral neck or spine after appropriate evaluation to exclude secondary causes c. Low bone mass (T-score between -1.0 and -2.5 at the femoral neck or spine) and a 10-year  probability of a hip fracture > 3% or a 10-year probability of a major osteoporosis-related fracture > 20% based on the [place]-adapted WHO algorithm d. Clinician judgment and/or patient preferences may indicate treatment for people with 10-year fracture probabilities above or below these levels FOLLOW-UP: People with diagnosed cases of osteoporosis  or at high risk for fracture should have regular bone mineral density tests. For patients eligible for Medicare, routine testing is allowed once every 2 years. The testing frequency can be increased to one year for patients who have rapidly progressing disease, those who are receiving or discontinuing medical therapy to restore bone mass, or have additional risk factors. I have reviewed this report, and agree with the above findings. Mark A. Thornton Papas, M.D. [facility] Radiology Electronically Signed   By: Lavonia Dana M.D.   On: 06/10/2019 15:34   Mm Diag Breast Tomo Bilateral  Result Date: 06/10/2019 CLINICAL DATA:  78 year old patient presents for annual diagnostic mammogram. She has a history right malignant lumpectomy November 2018. EXAM: DIGITAL DIAGNOSTIC BILATERAL MAMMOGRAM WITH CAD AND TOMO COMPARISON:  Previous exam(s). ACR Breast Density Category b: There are scattered areas of fibroglandular density. FINDINGS: There are new linearly oriented coarse heterogeneous calcifications in the central inferior right breast. These calcifications span approximately 5.4 cm AP diameter by 2.6 cm transverse diameter by 2.5 cm craniocaudal span. Additionally, there is new peripherally calcified fat density consistent with benign fat necrosis in the immediate retroareolar right breast. Benign fat necrosis is also seen in the posterior third of the central inferior right breast. The left breast is negative. Mammographic images were processed with CAD. IMPRESSION: New coarse heterogeneous linearly oriented in the inferior central right breast at the prior lumpectomy site. Ductal  carcinoma in situ cannot be excluded. Benign calcified fat necrosis is considered. No evidence of malignancy in the left breast. RECOMMENDATION: Stereotactic biopsy of right breast calcifications is recommended and will be scheduled for the patient. I have discussed the findings and recommendations with the patient. If applicable, a reminder letter will be sent to the patient regarding the next appointment. BI-RADS CATEGORY  4: Suspicious. Electronically Signed   By: Curlene Dolphin M.D.   On: 06/10/2019 13:38    ASSESSMENT: DCIS, right breast  PLAN:    1. DCIS, right breast: Patient underwent lumpectomy on June 07, 2017.  Final pathology was reviewed and no invasive component was noted, therefore patient did not require chemotherapy.  She completed adjuvant XRT in January 2019.  Because of patient's persistent hot flashes, tamoxifen was discontinued and patient was initiated on letrozole.  Patient expressed understanding that although tamoxifen is the recommended treatment, there is still significant benefit with aromatase inhibitors.  Continue treatment for a total of 5 years completing in January 2024.  Patient's most recent mammogram on June 10, 2019 was reported as BI-RADS 4 and she has a biopsy scheduled for next week.  If biopsy is negative, patient will have video assisted telemedicine visit in 6 months. If positive, patient will return to clinic sooner to discuss the results and treatment planning.   2.  Thrombocytopenia: Mild, monitor.   3.  Osteoporosis: Patient's most recent bone mineral density on June 10, 2019 reported T score of -2.7 which is slightly worse than 1 year prior with a T score reported -2.4.  Patient was given a prescription for Fosamax today.  She has also been instructed to continue calcium and vitamin D supplementation.  Repeat bone mineral density in October 2021.    I spent a total of 30 minutes face-to-face with the patient of which greater than 50% of the visit  was spent in counseling and coordination of care as detailed above.   Patient expressed understanding and was in agreement with this plan. She also understands that She can call clinic at any time  with any questions, concerns, or complaints.   Cancer Staging Ductal carcinoma in situ (DCIS) of right breast Staging form: Breast, AJCC 8th Edition - Clinical stage from 08/22/2017: Stage 0 (cTis (DCIS), cN0, cM0, ER: Positive, PR: Negative, HER2: Negative) - Signed by Lloyd Huger, MD on 08/22/2017   Lloyd Huger, MD   06/14/2019 7:55 AM

## 2019-06-13 ENCOUNTER — Other Ambulatory Visit: Payer: Self-pay

## 2019-06-13 ENCOUNTER — Inpatient Hospital Stay: Payer: PPO | Attending: Oncology | Admitting: Oncology

## 2019-06-13 ENCOUNTER — Encounter: Payer: Self-pay | Admitting: Oncology

## 2019-06-13 VITALS — BP 155/48 | HR 63 | Temp 98.0°F | Ht 61.0 in | Wt 155.0 lb

## 2019-06-13 DIAGNOSIS — Z90710 Acquired absence of both cervix and uterus: Secondary | ICD-10-CM | POA: Diagnosis not present

## 2019-06-13 DIAGNOSIS — Z923 Personal history of irradiation: Secondary | ICD-10-CM | POA: Diagnosis not present

## 2019-06-13 DIAGNOSIS — Z79811 Long term (current) use of aromatase inhibitors: Secondary | ICD-10-CM | POA: Diagnosis not present

## 2019-06-13 DIAGNOSIS — Z86010 Personal history of colonic polyps: Secondary | ICD-10-CM | POA: Insufficient documentation

## 2019-06-13 DIAGNOSIS — D0511 Intraductal carcinoma in situ of right breast: Secondary | ICD-10-CM | POA: Diagnosis present

## 2019-06-13 DIAGNOSIS — M810 Age-related osteoporosis without current pathological fracture: Secondary | ICD-10-CM | POA: Insufficient documentation

## 2019-06-13 DIAGNOSIS — Z809 Family history of malignant neoplasm, unspecified: Secondary | ICD-10-CM | POA: Insufficient documentation

## 2019-06-13 DIAGNOSIS — D696 Thrombocytopenia, unspecified: Secondary | ICD-10-CM | POA: Diagnosis not present

## 2019-06-14 MED ORDER — ALENDRONATE SODIUM 35 MG PO TABS: 70.0000 mg | ORAL_TABLET | ORAL | 12 refills | Status: DC

## 2019-06-16 ENCOUNTER — Encounter: Payer: Self-pay | Admitting: Oncology

## 2019-06-17 ENCOUNTER — Other Ambulatory Visit: Payer: Self-pay | Admitting: *Deleted

## 2019-06-17 ENCOUNTER — Telehealth: Payer: Self-pay | Admitting: *Deleted

## 2019-06-17 DIAGNOSIS — M810 Age-related osteoporosis without current pathological fracture: Secondary | ICD-10-CM

## 2019-06-17 DIAGNOSIS — J301 Allergic rhinitis due to pollen: Secondary | ICD-10-CM | POA: Diagnosis not present

## 2019-06-17 DIAGNOSIS — J3081 Allergic rhinitis due to animal (cat) (dog) hair and dander: Secondary | ICD-10-CM | POA: Diagnosis not present

## 2019-06-17 DIAGNOSIS — J3089 Other allergic rhinitis: Secondary | ICD-10-CM | POA: Diagnosis not present

## 2019-06-17 MED ORDER — ALENDRONATE SODIUM 35 MG PO TABS: 70.0000 mg | ORAL_TABLET | ORAL | 3 refills | Status: DC

## 2019-06-17 NOTE — Telephone Encounter (Signed)
Dr Bary Castilla, Per patient request, I am sending her mammogram gram results to you  CLINICAL DATA:  78 year old patient presents for annual diagnostic mammogram. She has a history right malignant lumpectomy November 2018.  EXAM: DIGITAL DIAGNOSTIC BILATERAL MAMMOGRAM WITH CAD AND TOMO  COMPARISON:  Previous exam(s).  ACR Breast Density Category b: There are scattered areas of fibroglandular density.  FINDINGS: There are new linearly oriented coarse heterogeneous calcifications in the central inferior right breast. These calcifications span approximately 5.4 cm AP diameter by 2.6 cm transverse diameter by 2.5 cm craniocaudal span.  Additionally, there is new peripherally calcified fat density consistent with benign fat necrosis in the immediate retroareolar right breast. Benign fat necrosis is also seen in the posterior third of the central inferior right breast.  The left breast is negative.  Mammographic images were processed with CAD.  IMPRESSION: New coarse heterogeneous linearly oriented in the inferior central right breast at the prior lumpectomy site. Ductal carcinoma in situ cannot be excluded. Benign calcified fat necrosis is considered.  No evidence of malignancy in the left breast.  RECOMMENDATION: Stereotactic biopsy of right breast calcifications is recommended and will be scheduled for the patient.  I have discussed the findings and recommendations with the patient. If applicable, a reminder letter will be sent to the patient regarding the next appointment.  BI-RADS CATEGORY  4: Suspicious.   Electronically Signed   By: Curlene Dolphin M.D.   On: 06/10/2019 13:38

## 2019-06-18 ENCOUNTER — Ambulatory Visit: Payer: PPO | Admitting: Surgery

## 2019-06-18 ENCOUNTER — Ambulatory Visit
Admission: RE | Admit: 2019-06-18 | Discharge: 2019-06-18 | Disposition: A | Discharge status: Home / Self Care | Payer: PPO | Source: Ambulatory Visit | Attending: Oncology | Admitting: Oncology

## 2019-06-18 ENCOUNTER — Other Ambulatory Visit: Payer: Self-pay

## 2019-06-18 DIAGNOSIS — R921 Mammographic calcification found on diagnostic imaging of breast: Secondary | ICD-10-CM | POA: Diagnosis present

## 2019-06-18 DIAGNOSIS — N6081 Other benign mammary dysplasias of right breast: Secondary | ICD-10-CM | POA: Diagnosis not present

## 2019-06-18 DIAGNOSIS — R928 Other abnormal and inconclusive findings on diagnostic imaging of breast: Secondary | ICD-10-CM | POA: Diagnosis present

## 2019-06-18 HISTORY — PX: BREAST BIOPSY: SHX20

## 2019-06-19 ENCOUNTER — Other Ambulatory Visit (INDEPENDENT_AMBULATORY_CARE_PROVIDER_SITE_OTHER): Payer: PPO

## 2019-06-19 ENCOUNTER — Other Ambulatory Visit: Payer: Self-pay

## 2019-06-19 DIAGNOSIS — E782 Mixed hyperlipidemia: Secondary | ICD-10-CM

## 2019-06-19 LAB — LIPID PANEL
Cholesterol: 178 mg/dL (ref 0–200)
HDL: 46.1 mg/dL
LDL Cholesterol: 106 mg/dL — ABNORMAL HIGH (ref 0–99)
NonHDL: 131.48
Total CHOL/HDL Ratio: 4
Triglycerides: 129 mg/dL (ref 0.0–149.0)
VLDL: 25.8 mg/dL (ref 0.0–40.0)

## 2019-06-19 LAB — SURGICAL PATHOLOGY

## 2019-06-19 MED ORDER — LEVOXYL 75 MCG PO TABS: ORAL_TABLET | ORAL | 2 refills | Status: DC

## 2019-06-19 MED ORDER — ACEBUTOLOL HCL 200 MG PO CAPS: 200.0000 mg | ORAL_CAPSULE | Freq: Every day | ORAL | 0 refills | Status: DC

## 2019-06-19 NOTE — Progress Notes (Signed)
Refills for levoxyl and acebutolol was refilled and sent to mail order per patient request by fax from the mail order.  Brealynn Contino,cma

## 2019-06-24 DIAGNOSIS — C50111 Malignant neoplasm of central portion of right female breast: Secondary | ICD-10-CM | POA: Diagnosis not present

## 2019-06-24 DIAGNOSIS — J3081 Allergic rhinitis due to animal (cat) (dog) hair and dander: Secondary | ICD-10-CM | POA: Diagnosis not present

## 2019-06-24 DIAGNOSIS — J3089 Other allergic rhinitis: Secondary | ICD-10-CM | POA: Diagnosis not present

## 2019-06-24 DIAGNOSIS — J301 Allergic rhinitis due to pollen: Secondary | ICD-10-CM | POA: Diagnosis not present

## 2019-06-25 ENCOUNTER — Encounter: Payer: Self-pay | Admitting: Family Medicine

## 2019-07-01 DIAGNOSIS — J3081 Allergic rhinitis due to animal (cat) (dog) hair and dander: Secondary | ICD-10-CM | POA: Diagnosis not present

## 2019-07-01 DIAGNOSIS — J301 Allergic rhinitis due to pollen: Secondary | ICD-10-CM | POA: Diagnosis not present

## 2019-07-01 DIAGNOSIS — J3089 Other allergic rhinitis: Secondary | ICD-10-CM | POA: Diagnosis not present

## 2019-07-03 DIAGNOSIS — J3081 Allergic rhinitis due to animal (cat) (dog) hair and dander: Secondary | ICD-10-CM | POA: Diagnosis not present

## 2019-07-03 DIAGNOSIS — J3089 Other allergic rhinitis: Secondary | ICD-10-CM | POA: Diagnosis not present

## 2019-07-03 DIAGNOSIS — J301 Allergic rhinitis due to pollen: Secondary | ICD-10-CM | POA: Diagnosis not present

## 2019-07-08 DIAGNOSIS — H1045 Other chronic allergic conjunctivitis: Secondary | ICD-10-CM | POA: Diagnosis not present

## 2019-07-08 DIAGNOSIS — J3089 Other allergic rhinitis: Secondary | ICD-10-CM | POA: Diagnosis not present

## 2019-07-08 DIAGNOSIS — J301 Allergic rhinitis due to pollen: Secondary | ICD-10-CM | POA: Diagnosis not present

## 2019-07-08 DIAGNOSIS — J3081 Allergic rhinitis due to animal (cat) (dog) hair and dander: Secondary | ICD-10-CM | POA: Diagnosis not present

## 2019-07-09 ENCOUNTER — Other Ambulatory Visit: Payer: Self-pay

## 2019-07-09 MED ORDER — ACEBUTOLOL HCL 200 MG PO CAPS: 200.0000 mg | ORAL_CAPSULE | Freq: Every day | ORAL | 0 refills | Status: DC

## 2019-07-15 DIAGNOSIS — J301 Allergic rhinitis due to pollen: Secondary | ICD-10-CM | POA: Diagnosis not present

## 2019-07-15 DIAGNOSIS — J3081 Allergic rhinitis due to animal (cat) (dog) hair and dander: Secondary | ICD-10-CM | POA: Diagnosis not present

## 2019-07-15 DIAGNOSIS — J3089 Other allergic rhinitis: Secondary | ICD-10-CM | POA: Diagnosis not present

## 2019-07-22 DIAGNOSIS — J301 Allergic rhinitis due to pollen: Secondary | ICD-10-CM | POA: Diagnosis not present

## 2019-07-22 DIAGNOSIS — J3081 Allergic rhinitis due to animal (cat) (dog) hair and dander: Secondary | ICD-10-CM | POA: Diagnosis not present

## 2019-07-22 DIAGNOSIS — J3089 Other allergic rhinitis: Secondary | ICD-10-CM | POA: Diagnosis not present

## 2019-07-25 NOTE — Telephone Encounter (Signed)
Message sent to patient via mychart.  Briana Farner,cma  

## 2019-07-29 DIAGNOSIS — J3081 Allergic rhinitis due to animal (cat) (dog) hair and dander: Secondary | ICD-10-CM | POA: Diagnosis not present

## 2019-07-29 DIAGNOSIS — J301 Allergic rhinitis due to pollen: Secondary | ICD-10-CM | POA: Diagnosis not present

## 2019-07-29 DIAGNOSIS — J3089 Other allergic rhinitis: Secondary | ICD-10-CM | POA: Diagnosis not present

## 2019-08-01 NOTE — Telephone Encounter (Signed)
Please advise 

## 2019-08-04 ENCOUNTER — Ambulatory Visit (INDEPENDENT_AMBULATORY_CARE_PROVIDER_SITE_OTHER): Payer: PPO | Admitting: Obstetrics & Gynecology

## 2019-08-04 ENCOUNTER — Encounter: Payer: Self-pay | Admitting: Obstetrics & Gynecology

## 2019-08-04 ENCOUNTER — Other Ambulatory Visit: Payer: Self-pay

## 2019-08-04 VITALS — BP 122/80 | Ht 60.0 in | Wt 157.0 lb

## 2019-08-04 DIAGNOSIS — N819 Female genital prolapse, unspecified: Secondary | ICD-10-CM

## 2019-08-04 DIAGNOSIS — N8111 Cystocele, midline: Secondary | ICD-10-CM | POA: Diagnosis not present

## 2019-08-04 NOTE — Patient Instructions (Signed)
Anterior and Posterior Colporrhaphy  Anterior or posterior colporrhaphy is surgery to fix a prolapse of organs in the genital tract. Prolapse is a condition in which an organ bulges or drops down from its normal position. Organs that commonly prolapse include the rectum, bladder, vagina, and uterus. Prolapse can affect a single organ or several organs at the same time.  You may need this surgery if you have a severe prolapse that causes symptoms that interfere with your daily life and cannot be corrected with other treatments. Prolapse often worsens when women stop having their monthly periods (menopause) because estrogen loss weakens the muscles and tissues in the genital tract. Prolapse can also happen when the organs are damaged or weakened. This commonly happens after childbirth and as a result of aging. The type of colporrhaphy done depends on the type of genital prolapse. Types of genital prolapse include the following:  Cystocele. This is a prolapse of the bladder and the upper part of the front (anterior) wall of the vagina.  Rectocele. This is a prolapse of the rectum and the lower part of the back (posterior) wall of the vagina.  Enterocele. This is a prolapse of the small intestine. It appears as a bulge under the neck of the uterus at the top of the back wall of the vagina.  Procidentia. This is a complete prolapse of the uterus and the cervix. The prolapse can be seen and felt coming out of the vagina. Tell a health care provider about:  Any allergies you have.  All medicines you are taking, including vitamins, herbs, eye drops, creams, and over-the-counter medicines.  Any problems you or family members have had with anesthetic medicines.  Any blood disorders you have.  Any surgeries you have had.  Any medical conditions you have.  Smoking history or history of alcohol use.  Whether you are pregnant or may be pregnant. What are the risks? Generally, this is a safe  procedure. However, problems may occur, including:  Infection.  Bleeding.  Allergic reactions to medicines.  Damage to other structures or organs.  Problems urinating.  Incontinence.  Nerve damage.  Painful sex.  Constipation.  A blood clot that travels to your lungs. What happens before the procedure? Staying hydrated Follow instructions from your health care provider about hydration, which may include:  Up to 2 hours before the procedure - you may continue to drink clear liquids, such as water, clear fruit juice, black coffee, and plain tea. Eating and drinking restrictions Follow instructions from your health care provider about eating and drinking, which may include:  8 hours before the procedure - stop eating heavy meals or foods such as meat, fried foods, or fatty foods.  6 hours before the procedure - stop eating light meals or foods, such as toast or cereal.  6 hours before the procedure - stop drinking milk or drinks that contain milk.  2 hours before the procedure - stop drinking clear liquids. General instructions  Ask your health care provider about: ? Changing or stopping your regular medicines. This is especially important if you are taking diabetes medicines or blood thinners. ? Taking medicines such as aspirin and ibuprofen. These medicines can thin your blood. Do not take these medicines before your procedure if your health care provider instructs you not to.  You may be given antibiotics to help prevent infection.  You may be instructed to use estrogen cream in your vagina to help prevent complications and promote healing.  Do not use  any products that contain nicotine or tobacco, such as cigarettes and e-cigarettes, for at least 2 weeks before the procedure. If you need help quitting, ask your health care provider.  Plan to have someone take you home from the hospital. Also, arrange for someone to help you with activities during recovery. What  happens during the procedure?  To lower your risk of infection: ? Your health care team will wash or sanitize their hands. ? Your skin will be washed with soap. ? Hair may be removed from the surgical area.  An IV will be inserted into one of your veins.  You will be given one or more of the following: ? A medicine to help you relax (sedative). ? A medicine to make you fall asleep (general anesthetic).  You may be given antibiotics through your IV.  You will lie down on the operating table with your feet in stirrups.  A small, thin tube (catheter) will be inserted through your urethra into your bladder to drain urine during surgery and recovery.  An instrument (vaginal speculum) will be used to hold your vagina open.  Your health care provider will perform the procedure according to the type of repair you require: Anterior repair  An incision will be made in the midline section of the front part of the vaginal wall.  A triangular-shaped piece of vaginal tissue will be removed.  The stronger, healthier tissue will be sewn together in order to support the bladder.  These incisions may be closed with stitches (sutures).  Gauze packing will be placed inside your vagina. Posterior repair  An incision will be made midline on the back wall of the vagina.  A triangular portion of vaginal skin will be removed to expose the muscle.  Excess tissue will be removed, and stronger, healthier muscle and ligament tissue will be sewn together to support the rectum.  These incisions may be closed with stitches (sutures).  Gauze packing will be placed inside your vagina. Anterior and posterior repair  Both procedures will be done during the same surgery. The procedure may vary among health care providers and hospitals. What happens after the procedure?  Your blood pressure, heart rate, breathing rate, and blood oxygen level will be monitored until the medicines you were given have worn  off.  You will be given pain medicine as needed.  You will have a small tube in place to drain your bladder (urinary catheter). This will be in place until your bladder is working properly on its own.  You may have a gauze packing in your vagina for a few days to prevent bleeding.  You will start on a liquid diet and slowly move to a regular diet.  You will be encouraged to get up and walk as soon as you are able.  You may need to wear compression stockings. They help prevent blood clots and reduce swelling in your legs.  Do not drive for 24 hours if you were given a sedative. Summary  Anterior or posterior colporrhaphy is surgery to fix a prolapse of organs in the genital tract.  The type of repair done depends on the type of prolapse that is present.  Follow instructions from your health care provider about eating and drinking before the procedure.  You will be given a general anesthetic to make you fall asleep during the procedure. This information is not intended to replace advice given to you by your health care provider. Make sure you discuss any questions you have  with your health care provider. Document Released: 10/28/2003 Document Revised: 07/20/2017 Document Reviewed: 09/14/2016 Elsevier Patient Education  2020 [employer].

## 2019-08-04 NOTE — Progress Notes (Signed)
HPI:      [name] is a 78 y.o. G3P3 who presents today for her pessary follow up and examination related to her pelvic floor weakening.  Pt reports tolerating the pessary moderately well with  no vaginal bleeding and  no vaginal discharge.  Symptoms of pelvic floor weakening have greatly improved, yet she feels pressure and discomfort from the pessary itself.  She would like to discuss surgical options. She is voiding and defecating without difficulty. She currently has a Ring #2 pessary.  PMHx: She  has a past medical history of Allergy, Arthritis, Asthma, Breast cancer ([place]) (05/08/2017), Cataract, Chicken pox, Colon polyp, Constipation, Hyperlipidemia, Hypothyroidism, Palpitations, Personal history of radiation therapy, and Thyroid disease. Also,  has a past surgical history that includes Appendectomy (2012); Tonsillectomy and adenoidectomy (1963); Abdominal hysterectomy (1981); Vaginal delivery; Rotator cuff repair (Right); Finger fracture surgery (Left); Colonoscopy w/ polypectomy; Colonoscopy; Total knee arthroplasty (Right, 10/17/2016); Joint replacement; Breast lumpectomy with sentinel lymph node bx (Right, 06/07/2017); Hammer toe surgery (09/20/2017); Breast lumpectomy (Right, 06/07/2017); Breast lumpectomy (Right, 06/07/2017); Breast biopsy (Right, 05/08/2017); Breast biopsy (Right, 05/17/2017); and Breast biopsy (Right, 06/18/2019)., family history includes Arthritis in her father and mother; Cancer in her maternal aunt; Diabetes in her paternal aunt and another family member; Glaucoma in her brother; Heart attack in her father; Heart disease in her father and mother; Hyperlipidemia in her maternal aunt and another family member; Hypertension in her father and mother; Stroke in her maternal grandmother.,  reports that she has never smoked. She has never used smokeless tobacco. She reports that she does not drink alcohol or use drugs.  She has a current medication list which includes the  following prescription(s): acebutolol, acetaminophen, alendronate, aspirin ec, biotin, calcium-vitamin d, vitamin d-3, ezetimibe, fluticasone, krill oil, letrozole, levocetirizine, levoxyl, montelukast, polyethylene glycol powder, and vitamin b-12. Also, is allergic to lipitor [atorvastatin] and simvastatin.  Review of Systems  All other systems reviewed and are negative.   Objective: BP 122/80   Ht 5' (1.524 m)   Wt 157 lb (71.2 kg)   BMI 30.66 kg/m  Physical Exam Constitutional:      General: She is not in acute distress.    Appearance: She is well-developed.  Genitourinary:     Pelvic exam was performed with patient supine.     Vagina normal.     No vaginal erythema or bleeding.     Genitourinary Comments: Gr 2 vag vault prolapse and cystocele Min rectocele Mild atrophy  HENT:     Head: Normocephalic and atraumatic.     Nose: Nose normal.  Abdominal:     General: There is no distension.     Palpations: Abdomen is soft.     Tenderness: There is no abdominal tenderness.  Musculoskeletal:        General: Normal range of motion.  Neurological:     Mental Status: She is alert and oriented to person, place, and time.     Cranial Nerves: No cranial nerve deficit.  Skin:    General: Skin is warm and dry.  Psychiatric:        Attention and Perception: Attention normal.        Mood and Affect: Mood normal.        Speech: Speech normal.        Behavior: Behavior normal.        Cognition and Memory: Cognition normal.        Judgment: Judgment normal.     Pessary  Care Pessary removed and cleaned.  Vagina checked - without erosions - pessary replaced.  A/P:   ICD-10-CM   1. 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 Instructions given for care. Concerning symptoms to observe for are counseled to patient. Discussed options for surgery (ANTERIOR COLPORRHAPHY) vs continued pessary management (also could consider  smaller size pessary, although fit appropriate today)    She desires surgery in Jan 2021 Follow up scheduled for PRE OP  A total of 15 minutes were spent face-to-face with the patient during this encounter and over half of that time dealt with counseling and coordination of care.  Barnett Applebaum, MD, [facility], [facility] 08/04/2019  11:17 AM

## 2019-08-05 DIAGNOSIS — J301 Allergic rhinitis due to pollen: Secondary | ICD-10-CM | POA: Diagnosis not present

## 2019-08-05 DIAGNOSIS — J3089 Other allergic rhinitis: Secondary | ICD-10-CM | POA: Diagnosis not present

## 2019-08-05 DIAGNOSIS — J3081 Allergic rhinitis due to animal (cat) (dog) hair and dander: Secondary | ICD-10-CM | POA: Diagnosis not present

## 2019-08-08 ENCOUNTER — Telehealth: Payer: Self-pay | Admitting: Obstetrics & Gynecology

## 2019-08-08 NOTE — Telephone Encounter (Signed)
-----   Message from Gae Dry, MD sent at 08/04/2019 11:17 AM EST ----- Regarding: Surgery Surgery Booking Request Patient Full Name:  Gwendolyn King  MRN: [id number]  DOB: [birth year]  Surgeon: Hoyt Koch, MD  Requested Surgery Date and Time: Jan 2021 Primary Diagnosis AND Code:    1. 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

## 2019-08-08 NOTE — Telephone Encounter (Signed)
Patient is aware of H&P at [facility] on 1/19 @ 2:10pm w/ Dr. Kenton Kingfisher, Pre-admit testing to be scheduled, COVID testing on 1/26, and OR on 09/18/19. Patient is aware to quarantine after COVID testing. Patient is aware she may receive calls from the [place] and [facility]. Patient confirmed HTA. Patient's pcp is Dr. Caryl Bis and patient is aware she needs medical clearance.

## 2019-08-09 ENCOUNTER — Encounter: Payer: Self-pay | Admitting: Oncology

## 2019-08-09 ENCOUNTER — Encounter: Payer: Self-pay | Admitting: Family Medicine

## 2019-08-10 NOTE — Telephone Encounter (Signed)
This patient needs a clearance visit for upcoming surgery. Can you get her scheduled for early January?

## 2019-08-11 ENCOUNTER — Other Ambulatory Visit: Payer: Self-pay | Admitting: Emergency Medicine

## 2019-08-11 DIAGNOSIS — D0511 Intraductal carcinoma in situ of right breast: Secondary | ICD-10-CM

## 2019-08-11 MED ORDER — LETROZOLE 2.5 MG PO TABS: 2.5000 mg | ORAL_TABLET | Freq: Every day | ORAL | 3 refills | Status: DC

## 2019-08-11 NOTE — Telephone Encounter (Signed)
I called and scheduled the patient for a medical clearance appt in January.  Elyanna Wallick,cma

## 2019-08-12 DIAGNOSIS — J301 Allergic rhinitis due to pollen: Secondary | ICD-10-CM | POA: Diagnosis not present

## 2019-08-12 DIAGNOSIS — J3089 Other allergic rhinitis: Secondary | ICD-10-CM | POA: Diagnosis not present

## 2019-08-12 DIAGNOSIS — J3081 Allergic rhinitis due to animal (cat) (dog) hair and dander: Secondary | ICD-10-CM | POA: Diagnosis not present

## 2019-08-19 DIAGNOSIS — J301 Allergic rhinitis due to pollen: Secondary | ICD-10-CM | POA: Diagnosis not present

## 2019-08-19 DIAGNOSIS — J3089 Other allergic rhinitis: Secondary | ICD-10-CM | POA: Diagnosis not present

## 2019-08-19 DIAGNOSIS — J3081 Allergic rhinitis due to animal (cat) (dog) hair and dander: Secondary | ICD-10-CM | POA: Diagnosis not present

## 2019-08-26 DIAGNOSIS — J3089 Other allergic rhinitis: Secondary | ICD-10-CM | POA: Diagnosis not present

## 2019-08-26 DIAGNOSIS — J3081 Allergic rhinitis due to animal (cat) (dog) hair and dander: Secondary | ICD-10-CM | POA: Diagnosis not present

## 2019-08-26 DIAGNOSIS — J301 Allergic rhinitis due to pollen: Secondary | ICD-10-CM | POA: Diagnosis not present

## 2019-08-29 NOTE — Telephone Encounter (Signed)
Patient is aware of rescheduled H&P on 1/11 @ 8:10am w/ Dr. Kenton Kingfisher, Preadmit Testing to be rescheduled, COVID testing on 1/19, and OR on 09/11/19.

## 2019-08-29 NOTE — Telephone Encounter (Signed)
Patients clearance visit needs to be rescheduled to the week of the 11th. It is ok to place her in a 15 minute time slot.

## 2019-09-01 ENCOUNTER — Encounter: Payer: Self-pay | Admitting: Obstetrics & Gynecology

## 2019-09-01 ENCOUNTER — Ambulatory Visit (INDEPENDENT_AMBULATORY_CARE_PROVIDER_SITE_OTHER): Payer: PPO | Admitting: Obstetrics & Gynecology

## 2019-09-01 ENCOUNTER — Other Ambulatory Visit: Payer: Self-pay

## 2019-09-01 VITALS — BP 120/80 | Ht 60.05 in | Wt 159.0 lb

## 2019-09-01 DIAGNOSIS — N819 Female genital prolapse, unspecified: Secondary | ICD-10-CM

## 2019-09-01 DIAGNOSIS — N8111 Cystocele, midline: Secondary | ICD-10-CM

## 2019-09-01 NOTE — Progress Notes (Signed)
Gwendolyn HISTORY AND PHYSICAL EXAM  HPI:  Gwendolyn King is a 79 y.o. G3P3 No LMP recorded. Patient has had a hysterectomy.; she is being admitted for surgery related to pelvic relaxation.  Cystocele causes discomfort, pessary little help.  Prior hysterectomy.    PMHx: Past Medical History:  Diagnosis Date  . Allergy    hay fever, fall allergies  . Arthritis   . Asthma    as a child, dad was smoker  . 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  . Cataract    Bil  . Chicken pox   . Colon polyp   . Constipation   . Hyperlipidemia   . Hypothyroidism   . Palpitations    over 20 yeras ago  . Personal history of radiation therapy   . Thyroid disease    Past Surgical History:  Procedure Laterality Date  . ABDOMINAL HYSTERECTOMY  1981  . APPENDECTOMY  2012  . 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 BREAST BIOPSY Right 05/17/2017   [place] bx done at DR. Brynetts office, papillary carcinoma  . BREAST BIOPSY Right 06/18/2019   Stereo bc calcs path pending  . BREAST LUMPECTOMY Right 06/07/2017   DUCTAL CARCINOMA IN SITU (DCIS) with microinvasion at 7:00 5 cmfn  . BREAST LUMPECTOMY Right 06/07/2017   PAPILLARY LESION CONSISTENT WITH PAPILLARY CARCINOMA at retroaerolar 7:00 1.5 cmfn  . BREAST LUMPECTOMY WITH SENTINEL LYMPH NODE BIOPSY Right 06/07/2017   Wide excision of 2 foci of high-grade DCIS, microinvasion noted only on core biopsy. 2 microscopic foci lateral inferior margins. Patient elected not to proceed to reexcision.  . COLONOSCOPY    . COLONOSCOPY W/ POLYPECTOMY    . FINGER FRACTURE SURGERY Left    5th  . HAMMER TOE SURGERY  09/20/2017  . JOINT REPLACEMENT     right knee  . ROTATOR CUFF REPAIR Right   . TONSILLECTOMY AND ADENOIDECTOMY  1963  . TOTAL KNEE ARTHROPLASTY Right 10/17/2016   Procedure: TOTAL  KNEE ARTHROPLASTY;  Surgeon: Melrose Nakayama, MD;  Location: [facility];  Service: Orthopedics;  Laterality: Right;  Marland Kitchen VAGINAL DELIVERY     3   Family History  Problem Relation Age of Onset  . Arthritis Mother   . Heart disease Mother   . Hypertension Mother   . Arthritis Father   . Heart disease Father   . Hypertension Father   . Heart attack Father   . Stroke Maternal Grandmother   . Cancer Maternal Aunt        abdominal?  . Hyperlipidemia Maternal Aunt   . Diabetes Paternal Aunt   . Glaucoma Brother   . Hyperlipidemia Other   . Diabetes Other   . Breast cancer Neg Hx    Social History   Tobacco Use  . Smoking status: Never Smoker  . Smokeless tobacco: Never Used  Substance Use Topics  . Alcohol use: No  . Drug use: No    Current Outpatient Medications:  .  acebutolol (SECTRAL) 200 MG capsule, Take 1 capsule (200 mg total) by mouth daily., Disp: 90 capsule, Rfl: 0 .  acetaminophen (TYLENOL) 500 MG tablet, Take 1,000 mg by mouth at bedtime., Disp: , Rfl:  .  alendronate (FOSAMAX) 35 MG tablet, Take 2 tablets (70 mg total) by mouth every 7 (seven) days. Take with a full  glass of water on an empty stomach., Disp: 24 tablet, Rfl: 3 .  aspirin EC 81 MG tablet, Take 81 mg by mouth daily., Disp: , Rfl:  .  Biotin (BIOTIN 5000) 5 MG CAPS, Take 5 mg by mouth daily. , Disp: , Rfl:  .  calcium-vitamin D (OSCAL WITH D) 500-200 MG-UNIT tablet, Take 1 tablet by mouth., Disp: , Rfl:  .  Cholecalciferol (VITAMIN D-3) 1000 UNITS CAPS, Take 1,000 Units by mouth every evening. , Disp: , Rfl:  .  ezetimibe (ZETIA) 10 MG tablet, Take 1 tablet (10 mg total) by mouth daily., Disp: 90 tablet, Rfl: 3 .  fluticasone (FLONASE) 50 MCG/ACT nasal spray, USE 1 TO 2 SPRAY(S) IN EACH NOSTRIL ONCE DAILY FOR 30 DAYS, Disp: , Rfl: 3 .  KRILL OIL PO, Take 1 capsule by mouth daily. , Disp: , Rfl:  .  letrozole (FEMARA) 2.5 MG tablet, Take 1 tablet (2.5 mg total) by mouth daily., Disp: 90 tablet, Rfl: 3 .   levocetirizine (XYZAL) 5 MG tablet, TAKE 1 TABLET BY MOUTH ONCE DAILY IN THE EVENING FOR 30 DAYS, Disp: , Rfl: 3 .  LEVOXYL 75 MCG tablet, Take 1 tablet by mouth once daily, Disp: 90 tablet, Rfl: 2 .  montelukast (SINGULAIR) 10 MG tablet, TAKE 1 TABLET BY MOUTH ONCE DAILY FOR 30 DAYS, Disp: , Rfl: 3 .  polyethylene glycol powder (GLYCOLAX/MIRALAX) powder, Take 1 Container by mouth once., Disp: , Rfl:  .  vitamin B-12 (CYANOCOBALAMIN) 1000 MCG tablet, Take 1,000 mcg by mouth daily., Disp: , Rfl:  Allergies: Lipitor [atorvastatin] and Simvastatin  Review of Systems  Constitutional: Negative for chills, fever and malaise/fatigue.  HENT: Negative for congestion, sinus pain and sore throat.   Eyes: Negative for blurred vision and pain.  Respiratory: Negative for cough and wheezing.   Cardiovascular: Negative for chest pain and leg swelling.  Gastrointestinal: Negative for abdominal pain, constipation, diarrhea, heartburn, nausea and vomiting.  Genitourinary: Negative for dysuria, frequency, hematuria and urgency.  Musculoskeletal: Negative for back pain, joint pain, myalgias and neck pain.  Skin: Negative for itching and rash.  Neurological: Negative for dizziness, tremors and weakness.  Endo/Heme/Allergies: Does not bruise/bleed easily.  Psychiatric/Behavioral: Negative for depression. The patient is not nervous/anxious and does not have insomnia.     Objective: BP 120/80   Ht 5' 0.05" (1.525 m)   Wt 159 lb (72.1 kg)   BMI 31.00 kg/m   Filed Weights   09/01/19 0800  Weight: 159 lb (72.1 kg)   Physical Exam Constitutional:      General: She is not in acute distress.    Appearance: She is well-developed.  HENT:     Head: Normocephalic and atraumatic. No laceration.     Right Ear: Hearing normal.     Left Ear: Hearing normal.     Mouth/Throat:     Pharynx: Uvula midline.  Eyes:     Pupils: Pupils are equal, round, and reactive to light.  Neck:     Thyroid: No thyromegaly.   Cardiovascular:     Rate and Rhythm: Normal rate and regular rhythm.     Heart sounds: No murmur. No friction rub. No gallop.   Pulmonary:     Effort: Pulmonary effort is normal. No respiratory distress.     Breath sounds: Normal breath sounds. No wheezing.  Chest:     Breasts:        Right: No mass, skin change or tenderness.  Left: No mass, skin change or tenderness.  Abdominal:     General: Bowel sounds are normal. There is no distension.     Palpations: Abdomen is soft.     Tenderness: There is no abdominal tenderness. There is no rebound.  Musculoskeletal:        General: Normal range of motion.     Cervical back: Normal range of motion and neck supple.  Neurological:     Mental Status: She is alert and oriented to person, place, and time.     Cranial Nerves: No cranial nerve deficit.  Skin:    General: Skin is warm and dry.  Psychiatric:        Judgment: Judgment normal.  Vitals reviewed.     Assessment: 1. Cystocele, midline   2. Vaginal vault prolapse   Plan anterior colporrhaphy for treatment.  Does not desire mesh surgery, and has not done well with pessary.  I have had a careful discussion with this patient about all the options available and the risk/benefits of each. I have fully informed this patient that surgery may subject her to a variety of discomforts and risks: She understands that most patients have surgery with little difficulty, but problems can happen ranging from minor to fatal. These include nausea, vomiting, pain, bleeding, infection, poor healing, hernia, or formation of adhesions. Unexpected reactions may occur from any drug or anesthetic given. Unintended injury may occur to other pelvic or abdominal structures such as Fallopian tubes, ovaries, bladder, ureter (tube from kidney to bladder), or bowel. Nerves going from the pelvis to the legs may be injured. Any such injury may require immediate or later additional surgery to correct the problem.  Excessive blood loss requiring transfusion is very unlikely but possible. Dangerous blood clots may form in the legs or lungs. Physical and sexual activity will be restricted in varying degrees for an indeterminate period of time but most often 2-6 weeks.  Finally, she understands that it is impossible to list every possible undesirable effect and that the condition for which surgery is done is not always cured or significantly improved, and in rare cases may be even worse.Ample time was given to answer all questions.  Barnett Applebaum, MD, [facility], [facility] 09/01/2019  8:19 AM

## 2019-09-01 NOTE — Patient Instructions (Signed)
PRE ADMISSION TESTING For Covid, prior to procedure Tuesday 1/19 at 9:00-10:00 [place] [address] (drive up)  Results in 48-72 hours You will not receive notification if test results are negative. If positive for Covid19, your provider will notify you by phone, with additional instructions.   Anterior and Posterior Colporrhaphy and Sling Procedure, Care After This sheet gives you information about how to care for yourself after your procedure. Your health care provider may also give you more specific instructions. If you have problems or questions, contact your health care provider. What can I expect after the procedure? After the procedure, it is common to have:  Pain in the surgical area.  Vaginal discharge. You will need to use a sanitary pad during this time.  Fatigue. Follow these instructions at home: Incision care   Follow instructions from your health care provider about how to take care of your incision. Make sure you: ? Wash your hands with soap and water before touching the incision area. If soap and water are not available, use hand sanitizer. ? Clean your incision as told by your health care provider. ? Leave stitches (sutures), skin glue, or adhesive strips in place. These skin closures may need to stay in place for 2 weeks or longer. If adhesive strip edges start to loosen and curl up, you may trim the loose edges. Do not remove adhesive strips completely unless your health care provider tells you to do that.  Check your incision area every day for signs of infection. Check for: ? Redness, swelling, or pain. ? Fluid or blood. ? Warmth. ? Pus or a bad smell.  Check your incision every day to make sure the incision area is not separating or opening.  Do not take baths, swim, or use a hot tub until your health care provider approves. You may shower.  Keep the area between your vagina and rectum (perineal area) clean and dry. Make sure you clean the area  after each bowel movement and each time you urinate.  Ask your health care provider if you can take a sitz bath or sit in a tub of clean, warm water. Activity  Do gentle, daily activity as told by your health care provider. You may be told to take short walks every day and go farther each time. Ask your health care provider what activities are safe for you.  Limit stair climbing to once or twice a day in the first week, then slowly increase this activity.  Do not lift anything that is heavier than 10 lbs. (4.5 kg), or the limit that your health care provider tells you, until he or she says that it is safe. Avoid pushing or pulling motions.  Avoid standing for long periods of time.  Do not douche, use tampons, or have sex until your health care provider says it is okay.  Do not drive or use heavy machinery while taking prescription pain medicine. To prevent constipation  To prevent or treat constipation while you are taking prescription pain medicine, your health care provider may recommend that you: ? Take over-the-counter or prescription medicines. ? Eat foods that are high in fiber, such as fresh fruits and vegetables, whole grains, and beans. ? Drink enough fluid to keep your urine clear or pale yellow. ? Limit foods that are high in fat and processed sugars, such as fried and sweet foods. General instructions  You may be instructed to do pelvic floor exercises (kegels) as told by your health care provider.  Take over-the-counter and prescription medicines only as told by your health care provider.  Keep all follow-up visits as told by your health care provider. This is important. Contact a health care provider if:  Medicine does not help your pain.  You have frequent or urgent urination, or you are unable to completely empty your bladder.  You feel a burning sensation when urinating.  You have fluid or blood coming from your incision.  You have pus or a bad smell coming  from the incision.  Your incision feels warm to the touch.  You have redness, swelling, or pain around your incision. Get help right away if:  You have a fever or chills.  Your incision separates or opens.  You cannot urinate.  You have trouble breathing. Summary  After the procedure, it is common to have pain, fatigue, and discharge from the vagina.  Keep the area between your vagina and rectum (perineal area) clean and dry. Make sure you clean the area after each bowel movement and each time you urinate.  Follow instructions from your health care provider on any activity restrictions after the procedure. This information is not intended to replace advice given to you by your health care provider. Make sure you discuss any questions you have with your health care provider. Document Revised: 07/20/2017 Document Reviewed: 08/07/2016 Elsevier Patient Education  2020 [employer].

## 2019-09-01 NOTE — H&P (View-Only) (Signed)
PRE-OPERATIVE HISTORY AND PHYSICAL EXAM  HPI:  Gwendolyn King is a 79 y.o. G3P3 No LMP recorded. Patient has had a hysterectomy.; she is being admitted for surgery related to pelvic relaxation.  Cystocele causes discomfort, pessary little help.  Prior hysterectomy.    PMHx: Past Medical History:  Diagnosis Date  . Allergy    hay fever, fall allergies  . Arthritis   . Asthma    as a child, dad was smoker  . 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  . Cataract    Bil  . Chicken pox   . Colon polyp   . Constipation   . Hyperlipidemia   . Hypothyroidism   . Palpitations    over 20 yeras ago  . Personal history of radiation therapy   . Thyroid disease    Past Surgical History:  Procedure Laterality Date  . ABDOMINAL HYSTERECTOMY  1981  . APPENDECTOMY  2012  . 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 BREAST BIOPSY Right 05/17/2017   [place] bx done at DR. Brynetts office, papillary carcinoma  . BREAST BIOPSY Right 06/18/2019   Stereo bc calcs path pending  . BREAST LUMPECTOMY Right 06/07/2017   DUCTAL CARCINOMA IN SITU (DCIS) with microinvasion at 7:00 5 cmfn  . BREAST LUMPECTOMY Right 06/07/2017   PAPILLARY LESION CONSISTENT WITH PAPILLARY CARCINOMA at retroaerolar 7:00 1.5 cmfn  . BREAST LUMPECTOMY WITH SENTINEL LYMPH NODE BIOPSY Right 06/07/2017   Wide excision of 2 foci of high-grade DCIS, microinvasion noted only on core biopsy. 2 microscopic foci lateral inferior margins. Patient elected not to proceed to reexcision.  . COLONOSCOPY    . COLONOSCOPY W/ POLYPECTOMY    . FINGER FRACTURE SURGERY Left    5th  . HAMMER TOE SURGERY  09/20/2017  . JOINT REPLACEMENT     right knee  . ROTATOR CUFF REPAIR Right   . TONSILLECTOMY AND ADENOIDECTOMY  1963  . TOTAL KNEE ARTHROPLASTY Right 10/17/2016   Procedure: TOTAL  KNEE ARTHROPLASTY;  Surgeon: Melrose Nakayama, MD;  Location: [facility];  Service: Orthopedics;  Laterality: Right;  Marland Kitchen VAGINAL DELIVERY     3   Family History  Problem Relation Age of Onset  . Arthritis Mother   . Heart disease Mother   . Hypertension Mother   . Arthritis Father   . Heart disease Father   . Hypertension Father   . Heart attack Father   . Stroke Maternal Grandmother   . Cancer Maternal Aunt        abdominal?  . Hyperlipidemia Maternal Aunt   . Diabetes Paternal Aunt   . Glaucoma Brother   . Hyperlipidemia Other   . Diabetes Other   . Breast cancer Neg Hx    Social History   Tobacco Use  . Smoking status: Never Smoker  . Smokeless tobacco: Never Used  Substance Use Topics  . Alcohol use: No  . Drug use: No    Current Outpatient Medications:  .  acebutolol (SECTRAL) 200 MG capsule, Take 1 capsule (200 mg total) by mouth daily., Disp: 90 capsule, Rfl: 0 .  acetaminophen (TYLENOL) 500 MG tablet, Take 1,000 mg by mouth at bedtime., Disp: , Rfl:  .  alendronate (FOSAMAX) 35 MG tablet, Take 2 tablets (70 mg total) by mouth every 7 (seven) days. Take with a full  glass of water on an empty stomach., Disp: 24 tablet, Rfl: 3 .  aspirin EC 81 MG tablet, Take 81 mg by mouth daily., Disp: , Rfl:  .  Biotin (BIOTIN 5000) 5 MG CAPS, Take 5 mg by mouth daily. , Disp: , Rfl:  .  calcium-vitamin D (OSCAL WITH D) 500-200 MG-UNIT tablet, Take 1 tablet by mouth., Disp: , Rfl:  .  Cholecalciferol (VITAMIN D-3) 1000 UNITS CAPS, Take 1,000 Units by mouth every evening. , Disp: , Rfl:  .  ezetimibe (ZETIA) 10 MG tablet, Take 1 tablet (10 mg total) by mouth daily., Disp: 90 tablet, Rfl: 3 .  fluticasone (FLONASE) 50 MCG/ACT nasal spray, USE 1 TO 2 SPRAY(S) IN EACH NOSTRIL ONCE DAILY FOR 30 DAYS, Disp: , Rfl: 3 .  KRILL OIL PO, Take 1 capsule by mouth daily. , Disp: , Rfl:  .  letrozole (FEMARA) 2.5 MG tablet, Take 1 tablet (2.5 mg total) by mouth daily., Disp: 90 tablet, Rfl: 3 .   levocetirizine (XYZAL) 5 MG tablet, TAKE 1 TABLET BY MOUTH ONCE DAILY IN THE EVENING FOR 30 DAYS, Disp: , Rfl: 3 .  LEVOXYL 75 MCG tablet, Take 1 tablet by mouth once daily, Disp: 90 tablet, Rfl: 2 .  montelukast (SINGULAIR) 10 MG tablet, TAKE 1 TABLET BY MOUTH ONCE DAILY FOR 30 DAYS, Disp: , Rfl: 3 .  polyethylene glycol powder (GLYCOLAX/MIRALAX) powder, Take 1 Container by mouth once., Disp: , Rfl:  .  vitamin B-12 (CYANOCOBALAMIN) 1000 MCG tablet, Take 1,000 mcg by mouth daily., Disp: , Rfl:  Allergies: Lipitor [atorvastatin] and Simvastatin  Review of Systems  Constitutional: Negative for chills, fever and malaise/fatigue.  HENT: Negative for congestion, sinus pain and sore throat.   Eyes: Negative for blurred vision and pain.  Respiratory: Negative for cough and wheezing.   Cardiovascular: Negative for chest pain and leg swelling.  Gastrointestinal: Negative for abdominal pain, constipation, diarrhea, heartburn, nausea and vomiting.  Genitourinary: Negative for dysuria, frequency, hematuria and urgency.  Musculoskeletal: Negative for back pain, joint pain, myalgias and neck pain.  Skin: Negative for itching and rash.  Neurological: Negative for dizziness, tremors and weakness.  Endo/Heme/Allergies: Does not bruise/bleed easily.  Psychiatric/Behavioral: Negative for depression. The patient is not nervous/anxious and does not have insomnia.     Objective: BP 120/80   Ht 5' 0.05" (1.525 m)   Wt 159 lb (72.1 kg)   BMI 31.00 kg/m   Filed Weights   09/01/19 0800  Weight: 159 lb (72.1 kg)   Physical Exam Constitutional:      General: She is not in acute distress.    Appearance: She is well-developed.  HENT:     Head: Normocephalic and atraumatic. No laceration.     Right Ear: Hearing normal.     Left Ear: Hearing normal.     Mouth/Throat:     Pharynx: Uvula midline.  Eyes:     Pupils: Pupils are equal, round, and reactive to light.  Neck:     Thyroid: No thyromegaly.   Cardiovascular:     Rate and Rhythm: Normal rate and regular rhythm.     Heart sounds: No murmur. No friction rub. No gallop.   Pulmonary:     Effort: Pulmonary effort is normal. No respiratory distress.     Breath sounds: Normal breath sounds. No wheezing.  Chest:     Breasts:        Right: No mass, skin change or tenderness.  Left: No mass, skin change or tenderness.  Abdominal:     General: Bowel sounds are normal. There is no distension.     Palpations: Abdomen is soft.     Tenderness: There is no abdominal tenderness. There is no rebound.  Musculoskeletal:        General: Normal range of motion.     Cervical back: Normal range of motion and neck supple.  Neurological:     Mental Status: She is alert and oriented to person, place, and time.     Cranial Nerves: No cranial nerve deficit.  Skin:    General: Skin is warm and dry.  Psychiatric:        Judgment: Judgment normal.  Vitals reviewed.     Assessment: 1. Cystocele, midline   2. Vaginal vault prolapse   Plan anterior colporrhaphy for treatment.  Does not desire mesh surgery, and has not done well with pessary.  I have had a careful discussion with this patient about all the options available and the risk/benefits of each. I have fully informed this patient that surgery may subject her to a variety of discomforts and risks: She understands that most patients have surgery with little difficulty, but problems can happen ranging from minor to fatal. These include nausea, vomiting, pain, bleeding, infection, poor healing, hernia, or formation of adhesions. Unexpected reactions may occur from any drug or anesthetic given. Unintended injury may occur to other pelvic or abdominal structures such as Fallopian tubes, ovaries, bladder, ureter (tube from kidney to bladder), or bowel. Nerves going from the pelvis to the legs may be injured. Any such injury may require immediate or later additional surgery to correct the problem.  Excessive blood loss requiring transfusion is very unlikely but possible. Dangerous blood clots may form in the legs or lungs. Physical and sexual activity will be restricted in varying degrees for an indeterminate period of time but most often 2-6 weeks.  Finally, she understands that it is impossible to list every possible undesirable effect and that the condition for which surgery is done is not always cured or significantly improved, and in rare cases may be even worse.Ample time was given to answer all questions.  Barnett Applebaum, MD, [facility], [facility] 09/01/2019  8:19 AM

## 2019-09-02 DIAGNOSIS — J301 Allergic rhinitis due to pollen: Secondary | ICD-10-CM | POA: Diagnosis not present

## 2019-09-02 DIAGNOSIS — J3089 Other allergic rhinitis: Secondary | ICD-10-CM | POA: Diagnosis not present

## 2019-09-02 DIAGNOSIS — J3081 Allergic rhinitis due to animal (cat) (dog) hair and dander: Secondary | ICD-10-CM | POA: Diagnosis not present

## 2019-09-02 NOTE — Telephone Encounter (Signed)
I called and scheduled the person for a in person visit for Monday for medical clearance.  Latesa Fratto,cma

## 2019-09-05 ENCOUNTER — Other Ambulatory Visit: Payer: Self-pay

## 2019-09-05 DIAGNOSIS — I10 Essential (primary) hypertension: Secondary | ICD-10-CM

## 2019-09-05 MED ORDER — ACEBUTOLOL HCL 200 MG PO CAPS: 200.0000 mg | ORAL_CAPSULE | Freq: Every day | ORAL | 0 refills | Status: DC

## 2019-09-05 NOTE — Telephone Encounter (Signed)
Patient needed a refill sent to a mail order her refills will be out on 09/08/2019. This was sent to [facility] for 90 days with 0 refills.  Jery Hollern,cma

## 2019-09-07 ENCOUNTER — Encounter: Payer: Self-pay | Admitting: Family Medicine

## 2019-09-08 ENCOUNTER — Encounter: Payer: Self-pay | Admitting: Family Medicine

## 2019-09-08 ENCOUNTER — Other Ambulatory Visit: Payer: Self-pay | Admitting: Obstetrics & Gynecology

## 2019-09-08 ENCOUNTER — Other Ambulatory Visit: Payer: Self-pay

## 2019-09-08 ENCOUNTER — Encounter
Admission: RE | Admit: 2019-09-08 | Discharge: 2019-09-08 | Disposition: A | Discharge status: Home / Self Care | Payer: PPO | Source: Ambulatory Visit | Attending: Obstetrics & Gynecology | Admitting: Obstetrics & Gynecology

## 2019-09-08 ENCOUNTER — Ambulatory Visit (INDEPENDENT_AMBULATORY_CARE_PROVIDER_SITE_OTHER): Payer: PPO | Admitting: Family Medicine

## 2019-09-08 VITALS — BP 140/80 | HR 62 | Temp 96.3°F | Ht 60.0 in | Wt 157.0 lb

## 2019-09-08 DIAGNOSIS — D171 Benign lipomatous neoplasm of skin and subcutaneous tissue of trunk: Secondary | ICD-10-CM

## 2019-09-08 DIAGNOSIS — L989 Disorder of the skin and subcutaneous tissue, unspecified: Secondary | ICD-10-CM | POA: Diagnosis not present

## 2019-09-08 DIAGNOSIS — Z01818 Encounter for other preprocedural examination: Secondary | ICD-10-CM | POA: Diagnosis not present

## 2019-09-08 DIAGNOSIS — D179 Benign lipomatous neoplasm, unspecified: Secondary | ICD-10-CM | POA: Insufficient documentation

## 2019-09-08 DIAGNOSIS — N8111 Cystocele, midline: Secondary | ICD-10-CM

## 2019-09-08 DIAGNOSIS — Z01812 Encounter for preprocedural laboratory examination: Secondary | ICD-10-CM | POA: Diagnosis present

## 2019-09-08 HISTORY — DX: Chronic kidney disease, unspecified: N18.9

## 2019-09-08 NOTE — Progress Notes (Signed)
Tommi Rumps, MD Phone: [phone]  Gwendolyn King is a 79 y.o. female who presents today for follow-up.  Presurgical evaluation/cystocele: Patient presents for presurgical evaluation for anterior colporrhaphy.  She denies any issues with chest pain or shortness of breath.  She does have CKD stage III that has been stable.  She reports a history of palpitations relating to PVCs.  Previously seen by cardiology for those.  No history of stroke.  No history of anesthesia issues.  No history of seizures.  She does take Levoxyl and her TSH has been stable.  No history of angina, liver disease, heart failure, asthma, diabetes, or bronchitis.  Notes her pessary is quite uncomfortable and she is ready to proceed with surgical intervention for this issue.  Subcutaneous lesion: Patient notes 2 lesions in her back.  One is in the right thoracic area and is raised with a soft texture.  There is no pain.  There is no tenderness.  She is not sure how long its been there.  The next one is in her lower back and is nontender.  She does not note any changes to either of these since noticing them.  Social History   Tobacco Use  Smoking Status Never Smoker  Smokeless Tobacco Never Used     ROS see history of present illness  Objective  Physical Exam Vitals:   09/08/19 1532  BP: 140/80  Pulse: 62  Temp: (!) 96.3 F (35.7 C)  SpO2: 98%    BP Readings from Last 3 Encounters:  09/08/19 140/80  09/01/19 120/80  08/04/19 122/80   Wt Readings from Last 3 Encounters:  09/08/19 157 lb (71.2 kg)  09/01/19 159 lb (72.1 kg)  08/04/19 157 lb (71.2 kg)    Physical Exam Constitutional:      General: She is not in acute distress.    Appearance: She is not diaphoretic.  HENT:     Head: Normocephalic and atraumatic.  Cardiovascular:     Rate and Rhythm: Normal rate and regular rhythm.     Heart sounds: Normal heart sounds.  Pulmonary:     Effort: Pulmonary effort is normal.     Breath sounds:  Normal breath sounds.  Abdominal:     General: Bowel sounds are normal. There is no distension.     Palpations: Abdomen is soft.     Tenderness: There is no abdominal tenderness. There is no guarding or rebound.  Musculoskeletal:       Arms:     Right lower leg: No edema.     Left lower leg: No edema.  Skin:    General: Skin is warm and dry.  Neurological:     Mental Status: She is alert.     EKG: Sinus bradycardia, rate 57, no T wave or ST changes noted Assessment/Plan: Please see individual problem list.  Preoperative examination Preoperative exam completed.  Patient is low risk for cardiac complications.  Lyndel Safe perioperative risk score of 0.2%.  Medically appears to be low risk as well.  She can proceed with her surgical procedure.  I will send this note to her gynecologist.  Lipoma Advised to monitor.  If any changes occur such as enlargement, pain, or tenderness she will let us know.  Lesion of subcutaneous tissue Back mouse noted.  She will monitor for any symptoms.  Cystocele, midline She is to undergo a surgical intervention.  She is low risk for surgery.   Orders Placed This Encounter  Procedures  . EKG 12-Lead  No orders of the defined types were placed in this encounter.   This visit occurred during the SARS-CoV-2 public health emergency.  Safety protocols were in place, including screening questions prior to the visit, additional usage of staff PPE, and extensive cleaning of exam room while observing appropriate contact time as indicated for disinfecting solutions.    Tommi Rumps, MD [place]

## 2019-09-08 NOTE — Patient Instructions (Signed)
Your procedure is scheduled on: 09-11-19 THURSDAY Report to Same Day Surgery 2nd floor medical mall [facility] Entrance-take elevator on left to 2nd floor.  Check in with surgery information desk.) To find out your arrival time please call [phone] between 1PM - 3PM on 09-10-19 [facility]  Remember: Instructions that are not followed completely may result in serious medical risk, up to and including death, or upon the discretion of your surgeon and anesthesiologist your surgery may need to be rescheduled.    _x___ 1. Do not eat food after midnight the night before your procedure. NO GUM OR CANDY AFTER MIDNIGHT. You may drink clear liquids up to 2 hours before you are scheduled to arrive at the hospital for your procedure.  Do not drink clear liquids within 2 hours of your scheduled arrival to the hospital.  Clear liquids include  --Water or Apple juice without pulp  --Gatorade  --Black Coffee or Clear Tea (No milk, no creamers, do not add anything to the coffee or Tea   ____Ensure clear carbohydrate drink on the way to the hospital for bariatric patients  ____Ensure clear carbohydrate drink 3 hours before surgery.    __x__ 2. No Alcohol for 24 hours before or after surgery.   __x__3. No Smoking or e-cigarettes for 24 prior to surgery.  Do not use any chewable tobacco products for at least 6 hour prior to surgery   ____  4. Bring all medications with you on the day of surgery if instructed.    __x__ 5. Notify your doctor if there is any change in your medical condition     (cold, fever, infections).    x___6. On the morning of surgery brush your teeth with toothpaste and water.  You may rinse your mouth with mouth wash if you wish.  Do not swallow any toothpaste or mouthwash.   Do not wear jewelry, make-up, hairpins, clips or nail polish.  Do not wear lotions, powders, or perfumes. You may wear deodorant.  Do not shave 48 hours prior to surgery. Men may shave face and neck.  Do  not bring valuables to the hospital.    [facility] is not responsible for any belongings or valuables.               Contacts, dentures or bridgework may not be worn into surgery.  Leave your suitcase in the car. After surgery it may be brought to your room.  For patients admitted to the hospital, discharge time is determined by your treatment team.  _  Patients discharged the day of surgery will not be allowed to drive home.  You will need someone to drive you home and stay with you the night of your procedure.    Please read over the following fact sheets that you were given:   [facility] Preparing for Surgery/[facility]ENT[facility]OMETER  _x___ TAKE THE FOLLOWING MEDICATION THE MORNING OF SURGERY WITH A SMALL SIP OF WATER. These include:  1. LEVOXYL  2. SINGULAIR  3.  4.  5.  6.  ____Fleets enema or Magnesium Citrate as directed.   ____ Use CHG Soap or sage wipes as directed on instruction sheet   ____ Use inhalers on the day of surgery and bring to hospital day of surgery  ____ Stop Metformin and Janumet 2 days prior to surgery.    ____ Take 1/2 of usual insulin dose the night before surgery and none on the morning surgery.   _x___ Follow recommendations from Cardiologist, Pulmonologist  or PCP regarding stopping Aspirin, Coumadin, Plavix ,Eliquis, Effient, or Pradaxa, and Pletal-STOPPED ASPIRIN LAST WEEK   X____Stop Anti-inflammatories such as Advil, Aleve, Ibuprofen, Motrin, Naproxen, Naprosyn, Goodies powders or aspirin products NOW-OK to take Tylenol    _x___ Stop supplements until after surgery-STOP KRILL OIL AND BIOTIN NOW-MAY RESUME AFTER SURGERY   ____ Bring C-Pap to the hospital.

## 2019-09-08 NOTE — Assessment & Plan Note (Signed)
Preoperative exam completed.  Patient is low risk for cardiac complications.  Gwendolyn King perioperative risk score of 0.2%.  Medically appears to be low risk as well.  She can proceed with her surgical procedure.  I will send this note to her gynecologist.

## 2019-09-08 NOTE — Patient Instructions (Signed)
Nice to see you. Good luck with your surgery. Please monitor the spots on your back and if they enlarge, change, or become painful please let us know.

## 2019-09-08 NOTE — Assessment & Plan Note (Signed)
Advised to monitor.  If any changes occur such as enlargement, pain, or tenderness she will let us know.

## 2019-09-08 NOTE — Assessment & Plan Note (Signed)
Back mouse noted.  She will monitor for any symptoms.

## 2019-09-08 NOTE — Assessment & Plan Note (Signed)
She is to undergo a surgical intervention.  She is low risk for surgery.

## 2019-09-09 ENCOUNTER — Encounter: Payer: PPO | Admitting: Obstetrics & Gynecology

## 2019-09-09 ENCOUNTER — Other Ambulatory Visit
Admission: RE | Admit: 2019-09-09 | Discharge: 2019-09-09 | Disposition: A | Discharge status: Home / Self Care | Payer: PPO | Source: Ambulatory Visit | Attending: Obstetrics & Gynecology | Admitting: Obstetrics & Gynecology

## 2019-09-09 DIAGNOSIS — Z20822 Contact with and (suspected) exposure to covid-19: Secondary | ICD-10-CM | POA: Insufficient documentation

## 2019-09-09 DIAGNOSIS — J301 Allergic rhinitis due to pollen: Secondary | ICD-10-CM | POA: Diagnosis not present

## 2019-09-09 DIAGNOSIS — J3089 Other allergic rhinitis: Secondary | ICD-10-CM | POA: Diagnosis not present

## 2019-09-09 DIAGNOSIS — J3081 Allergic rhinitis due to animal (cat) (dog) hair and dander: Secondary | ICD-10-CM | POA: Diagnosis not present

## 2019-09-09 DIAGNOSIS — Z01812 Encounter for preprocedural laboratory examination: Secondary | ICD-10-CM | POA: Diagnosis present

## 2019-09-09 LAB — CBC
HCT: 43 % (ref 36.0–46.0)
Hemoglobin: 14.4 g/dL (ref 12.0–15.0)
MCH: 29.1 pg (ref 26.0–34.0)
MCHC: 33.5 g/dL (ref 30.0–36.0)
MCV: 87 fL (ref 80.0–100.0)
Platelets: 132 10*3/uL — ABNORMAL LOW (ref 150–400)
RBC: 4.94 MIL/uL (ref 3.87–5.11)
RDW: 12.7 % (ref 11.5–15.5)
WBC: 5.3 10*3/uL (ref 4.0–10.5)
nRBC: 0 % (ref 0.0–0.2)

## 2019-09-09 LAB — TYPE AND SCREEN
ABO/RH(D): B NEG
Antibody Screen: NEGATIVE

## 2019-09-09 LAB — SARS CORONAVIRUS 2 (TAT 6-24 HRS): SARS Coronavirus 2: NEGATIVE

## 2019-09-10 ENCOUNTER — Ambulatory Visit: Payer: PPO | Admitting: Family Medicine

## 2019-09-10 MED ORDER — CEFAZOLIN (ANCEF) 1 G IV SOLR
1.0000 g | INTRAVENOUS | Status: AC
  Administered 2019-09-11: 2 g
  Filled 2019-09-10: qty 1

## 2019-09-11 ENCOUNTER — Ambulatory Visit
Admission: RE | Admit: 2019-09-11 | Discharge: 2019-09-11 | Disposition: A | Discharge status: Home / Self Care | Payer: PPO | Attending: Obstetrics & Gynecology | Admitting: Obstetrics & Gynecology

## 2019-09-11 ENCOUNTER — Other Ambulatory Visit: Payer: Self-pay

## 2019-09-11 ENCOUNTER — Encounter
Admission: RE | Disposition: A | Discharge status: Home / Self Care | Payer: Self-pay | Source: Home / Self Care | Attending: Obstetrics & Gynecology

## 2019-09-11 ENCOUNTER — Ambulatory Visit: Payer: PPO | Admitting: Anesthesiology

## 2019-09-11 ENCOUNTER — Encounter: Payer: Self-pay | Admitting: Obstetrics & Gynecology

## 2019-09-11 DIAGNOSIS — Z79811 Long term (current) use of aromatase inhibitors: Secondary | ICD-10-CM | POA: Insufficient documentation

## 2019-09-11 DIAGNOSIS — E039 Hypothyroidism, unspecified: Secondary | ICD-10-CM | POA: Insufficient documentation

## 2019-09-11 DIAGNOSIS — Z79899 Other long term (current) drug therapy: Secondary | ICD-10-CM | POA: Insufficient documentation

## 2019-09-11 DIAGNOSIS — Z79890 Hormone replacement therapy (postmenopausal): Secondary | ICD-10-CM | POA: Insufficient documentation

## 2019-09-11 DIAGNOSIS — Z8709 Personal history of other diseases of the respiratory system: Secondary | ICD-10-CM | POA: Insufficient documentation

## 2019-09-11 DIAGNOSIS — M1990 Unspecified osteoarthritis, unspecified site: Secondary | ICD-10-CM | POA: Insufficient documentation

## 2019-09-11 DIAGNOSIS — Z7982 Long term (current) use of aspirin: Secondary | ICD-10-CM | POA: Diagnosis not present

## 2019-09-11 DIAGNOSIS — C50911 Malignant neoplasm of unspecified site of right female breast: Secondary | ICD-10-CM | POA: Insufficient documentation

## 2019-09-11 DIAGNOSIS — Z170 Estrogen receptor positive status [ER+]: Secondary | ICD-10-CM | POA: Insufficient documentation

## 2019-09-11 DIAGNOSIS — N819 Female genital prolapse, unspecified: Secondary | ICD-10-CM | POA: Diagnosis not present

## 2019-09-11 DIAGNOSIS — N8111 Cystocele, midline: Secondary | ICD-10-CM | POA: Diagnosis present

## 2019-09-11 DIAGNOSIS — I10 Essential (primary) hypertension: Secondary | ICD-10-CM | POA: Diagnosis not present

## 2019-09-11 DIAGNOSIS — Z923 Personal history of irradiation: Secondary | ICD-10-CM | POA: Insufficient documentation

## 2019-09-11 HISTORY — PX: CYSTOCELE REPAIR: SHX163

## 2019-09-11 LAB — ABO/RH: ABO/RH(D): B NEG

## 2019-09-11 SURGERY — ANTERIOR REPAIR (CYSTOCELE)
Anesthesia: General

## 2019-09-11 MED ORDER — LABETALOL HCL 5 MG/ML IV SOLN
INTRAVENOUS | Status: AC
  Administered 2019-09-11: 10 mg via INTRAVENOUS
  Filled 2019-09-11: qty 4

## 2019-09-11 MED ORDER — LABETALOL HCL 5 MG/ML IV SOLN: 10.0000 mg | Freq: Once | INTRAVENOUS | Status: AC

## 2019-09-11 MED ORDER — ONDANSETRON HCL 4 MG/2ML IJ SOLN
INTRAMUSCULAR | Status: DC | PRN
  Administered 2019-09-11: 4 mg via INTRAVENOUS

## 2019-09-11 MED ORDER — OXYCODONE-ACETAMINOPHEN 5-325 MG PO TABS: 1.0000 | ORAL_TABLET | ORAL | 0 refills | Status: DC | PRN

## 2019-09-11 MED ORDER — FENTANYL CITRATE (PF) 100 MCG/2ML IJ SOLN
INTRAMUSCULAR | Status: AC
  Filled 2019-09-11: qty 2

## 2019-09-11 MED ORDER — PROPOFOL 10 MG/ML IV BOLUS
INTRAVENOUS | Status: DC | PRN
  Administered 2019-09-11: 130 mg via INTRAVENOUS

## 2019-09-11 MED ORDER — SEVOFLURANE IN SOLN
RESPIRATORY_TRACT | Status: AC
  Filled 2019-09-11: qty 250

## 2019-09-11 MED ORDER — DEXAMETHASONE SODIUM PHOSPHATE 10 MG/ML IJ SOLN
INTRAMUSCULAR | Status: DC | PRN
  Administered 2019-09-11: 10 mg via INTRAVENOUS

## 2019-09-11 MED ORDER — OXYCODONE-ACETAMINOPHEN 5-325 MG PO TABS: 1.0000 | ORAL_TABLET | ORAL | Status: DC | PRN

## 2019-09-11 MED ORDER — FENTANYL CITRATE (PF) 100 MCG/2ML IJ SOLN: 25.0000 ug | INTRAMUSCULAR | Status: DC | PRN

## 2019-09-11 MED ORDER — ACETAMINOPHEN 325 MG PO TABS: 650.0000 mg | ORAL_TABLET | ORAL | Status: DC | PRN

## 2019-09-11 MED ORDER — LABETALOL HCL 5 MG/ML IV SOLN
5.0000 mg | INTRAVENOUS | Status: AC | PRN
  Administered 2019-09-11 (×2): 5 mg via INTRAVENOUS

## 2019-09-11 MED ORDER — LIDOCAINE-EPINEPHRINE 1 %-1:100000 IJ SOLN
INTRAMUSCULAR | Status: DC | PRN
  Administered 2019-09-11: 11 mL

## 2019-09-11 MED ORDER — ESTROGENS, CONJUGATED 0.625 MG/GM VA CREA
TOPICAL_CREAM | VAGINAL | Status: DC | PRN
  Administered 2019-09-11: 1 via VAGINAL

## 2019-09-11 MED ORDER — FAMOTIDINE 20 MG PO TABS
20.0000 mg | ORAL_TABLET | Freq: Once | ORAL | Status: AC
  Administered 2019-09-11: 20 mg via ORAL

## 2019-09-11 MED ORDER — LACTATED RINGERS IV SOLN: INTRAVENOUS | Status: DC

## 2019-09-11 MED ORDER — CEFAZOLIN SODIUM-DEXTROSE 1-4 GM/50ML-% IV SOLN
INTRAVENOUS | Status: AC
  Filled 2019-09-11: qty 50

## 2019-09-11 MED ORDER — LACTATED RINGERS IV SOLN: INTRAVENOUS | Status: DC | PRN

## 2019-09-11 MED ORDER — ONDANSETRON HCL 4 MG/2ML IJ SOLN: 4.0000 mg | Freq: Once | INTRAMUSCULAR | Status: DC | PRN

## 2019-09-11 MED ORDER — FENTANYL CITRATE (PF) 100 MCG/2ML IJ SOLN
INTRAMUSCULAR | Status: DC | PRN
  Administered 2019-09-11: 25 ug via INTRAVENOUS
  Administered 2019-09-11: 50 ug via INTRAVENOUS
  Administered 2019-09-11: 25 ug via INTRAVENOUS

## 2019-09-11 MED ORDER — FAMOTIDINE 20 MG PO TABS
ORAL_TABLET | ORAL | Status: AC
  Filled 2019-09-11: qty 1

## 2019-09-11 MED ORDER — MORPHINE SULFATE (PF) 4 MG/ML IV SOLN: 1.0000 mg | INTRAVENOUS | Status: DC | PRN

## 2019-09-11 MED ORDER — MIDAZOLAM HCL 2 MG/2ML IJ SOLN
INTRAMUSCULAR | Status: AC
  Filled 2019-09-11: qty 2

## 2019-09-11 MED ORDER — LIDOCAINE HCL (CARDIAC) PF 100 MG/5ML IV SOSY
PREFILLED_SYRINGE | INTRAVENOUS | Status: DC | PRN
  Administered 2019-09-11: 80 mg via INTRAVENOUS

## 2019-09-11 MED ORDER — ACETAMINOPHEN 650 MG RE SUPP
650.0000 mg | RECTAL | Status: DC | PRN
  Filled 2019-09-11: qty 1

## 2019-09-11 SURGICAL SUPPLY — 41 items
BAG URINE DRAIN 2000ML AR STRL (UROLOGICAL SUPPLIES) ×2
BLADE SURG 15 STRL LF DISP TIS (BLADE) ×1
BLADE SURG 15 STRL SS (BLADE) ×1
BLADE SURG SZ10 CARB STEEL (BLADE) ×2
CANISTER SUCT 1200ML W/VALVE (MISCELLANEOUS) ×2
CATH FOLEY 2WAY  5CC 16FR (CATHETERS) ×1
CATH URTH 16FR FL 2W BLN LF (CATHETERS) ×1
COVER WAND RF STERILE (DRAPES) ×2
DRAPE 3/4 80X56 (DRAPES) ×2
DRAPE PERI LITHO V/GYN (MISCELLANEOUS) ×2
DRAPE UNDER BUTTOCK W/FLU (DRAPES) ×2
ELECT REM PT RETURN 9FT ADLT (ELECTROSURGICAL) ×2
GAUZE 4X4 16PLY RFD (DISPOSABLE) ×2
GAUZE PACK 2X3YD (GAUZE/BANDAGES/DRESSINGS) ×2
GAUZE PACKING 1/2X5YD (GAUZE/BANDAGES/DRESSINGS) ×2
GLOVE BIO SURGEON STRL SZ8 (GLOVE) ×4
GOWN STRL REUS W/ TWL LRG LVL3 (GOWN DISPOSABLE) ×3
GOWN STRL REUS W/ TWL XL LVL3 (GOWN DISPOSABLE) ×1
GOWN STRL REUS W/TWL LRG LVL3 (GOWN DISPOSABLE) ×3
GOWN STRL REUS W/TWL XL LVL3 (GOWN DISPOSABLE) ×1
KIT TURNOVER CYSTO (KITS) ×2
KIT TURNOVER KIT A (KITS) ×2
LABEL OR SOLS (LABEL) ×2
NDL HPO THNWL 1X22GA REG BVL (NEEDLE) ×1
NEEDLE SAFETY 22GX1 (NEEDLE) ×1
NEEDLE SPNL 22GX3.5 QUINCKE BK (NEEDLE) ×2
NS IRRIG 500ML POUR BTL (IV SOLUTION) ×2
PACK BASIN MINOR ARMC (MISCELLANEOUS) ×2
PAD OB MATERNITY 4.3X12.25 (PERSONAL CARE ITEMS) ×2
PAD PREP 24X41 OB/GYN DISP (PERSONAL CARE ITEMS) ×2
SOL PREP PVP 2OZ (MISCELLANEOUS) ×2
SUT ETHIBOND NAB CT1 #1 30IN (SUTURE) ×4
SUT VIC AB 0 CT1 27 (SUTURE) ×1
SUT VIC AB 0 CT1 27XCR 8 STRN (SUTURE) ×1
SUT VIC AB 2-0 CT1 (SUTURE) ×4
SUT VIC AB 2-0 CT1 27 (SUTURE) ×1
SUT VIC AB 2-0 CT1 TAPERPNT 27 (SUTURE) ×1
SUT VIC AB 3-0 SH 27 (SUTURE) ×1
SUT VIC AB 3-0 SH 27X BRD (SUTURE) ×1
SYR 10ML LL (SYRINGE) ×2
SYR CONTROL 10ML LL (SYRINGE) ×2

## 2019-09-11 NOTE — Interval H&P Note (Signed)
History and Physical Interval Note:  09/11/2019 10:10 AM  Gwendolyn King  has presented today for surgery, with the diagnosis of Cystocele, midline N81.11 Vaginal vault prolapse N81.9.  The various methods of treatment have been discussed with the patient and family. After consideration of risks, benefits and other options for treatment, the patient has consented to  Procedure(s): ANTERIOR COLPORRHAPHY (N/A) as a surgical intervention.  The patient's history has been reviewed, patient examined, no change in status, stable for surgery.  I have reviewed the patient's chart and labs.  Questions were answered to the patient's satisfaction.     Hoyt Koch

## 2019-09-11 NOTE — OR Nursing (Addendum)
Packing removed per order and saline instilled per order, foley discontinued (per Michael Boston RN) and patient up to bedside commode and voided 300 mls.  Dr Kenton Kingfisher notified and patient may be discharged.

## 2019-09-11 NOTE — Anesthesia Preprocedure Evaluation (Addendum)
Anesthesia Evaluation  Patient identified by MRN, date of birth, ID band Patient awake    Reviewed: Allergy & Precautions, NPO status , Patient's Chart, lab work & pertinent test results  History of Anesthesia Complications Negative for: history of anesthetic complications  Airway Mallampati: II       Dental   Pulmonary asthma (as a child) , neg sleep apnea, neg COPD, Not current smoker,           Cardiovascular hypertension, Pt. on medications (-) Past MI and (-) CHF (-) dysrhythmias (-) Valvular Problems/Murmurs     Neuro/Psych neg Seizures    GI/Hepatic Neg liver ROS, neg GERD  ,  Endo/Other  neg diabetesHypothyroidism   Renal/GU Renal InsufficiencyRenal disease     Musculoskeletal   Abdominal   Peds  Hematology   Anesthesia Other Findings   Reproductive/Obstetrics                             Anesthesia Physical Anesthesia Plan  ASA: II  Anesthesia Plan: General   Post-op Pain Management:    Induction: Intravenous  PONV Risk Score and Plan: 3 and Ondansetron, Dexamethasone and Treatment may vary due to age or medical condition  Airway Management Planned: LMA  Additional Equipment:   Intra-op Plan:   Post-operative Plan:   Informed Consent: I have reviewed the patients History and Physical, chart, labs and discussed the procedure including the risks, benefits and alternatives for the proposed anesthesia with the patient or authorized representative who has indicated his/her understanding and acceptance.       Plan Discussed with:   Anesthesia Plan Comments:        Anesthesia Quick Evaluation

## 2019-09-11 NOTE — Discharge Instructions (Addendum)
Anterior Colporrhaphy, Care After  This sheet gives you information about how to care for yourself after your procedure. Your health care provider may also give you more specific instructions. If you have problems or questions, contact your health care provider. What can I expect after the procedure? After the procedure, it is common to have:  Pain in the surgical area.  Vaginal discharge. You will need to use a sanitary pad during this time.  Fatigue. Follow these instructions at home: Incision care   Follow instructions from your health care provider about how to take care of your incision. Make sure you: ? Wash your hands with soap and water before touching the incision area. If soap and water are not available, use hand sanitizer. ? Clean your incision as told by your health care provider. ? Leave stitches (sutures), skin glue, or adhesive strips in place. These skin closures may need to stay in place for 2 weeks or longer. If adhesive strip edges start to loosen and curl up, you may trim the loose edges. Do not remove adhesive strips completely unless your health care provider tells you to do that.  Check your incision area every day for signs of infection. Check for: ? Redness, swelling, or pain. ? Fluid or blood. ? Warmth. ? Pus or a bad smell.  Check your incision every day to make sure the incision area is not separating or opening.  Do not take baths, swim, or use a hot tub until your health care provider approves. You may shower.  Keep the area between your vagina and rectum (perineal area) clean and dry. Make sure you clean the area after each bowel movement and each time you urinate.  Ask your health care provider if you can take a sitz bath or sit in a tub of clean, warm water. Activity  Do gentle, daily activity as told by your health care provider. You may be told to take short walks every day and go farther each time. Ask your health care provider what activities  are safe for you.  Limit stair climbing to once or twice a day in the first week, then slowly increase this activity.  Do not lift anything that is heavier than 10 lbs. (4.5 kg), or the limit that your health care provider tells you, until he or she says that it is safe. Avoid pushing or pulling motions.  Avoid standing for long periods of time.  Do not douche, use tampons, or have sex until your health care provider says it is okay.  Do not drive or use heavy machinery while taking prescription pain medicine. To prevent constipation  To prevent or treat constipation while you are taking prescription pain medicine, your health care provider may recommend that you: ? Take over-the-counter or prescription medicines. ? Eat foods that are high in fiber, such as fresh fruits and vegetables, whole grains, and beans. ? Drink enough fluid to keep your urine clear or pale yellow. ? Limit foods that are high in fat and processed sugars, such as fried and sweet foods. General instructions  You may be instructed to do pelvic floor exercises (kegels) as told by your health care provider.  Take over-the-counter and prescription medicines only as told by your health care provider.  Keep all follow-up visits as told by your health care provider. This is important. Contact a health care provider if:  Medicine does not help your pain.  You have frequent or urgent urination, or you are unable to  completely empty your bladder.  You feel a burning sensation when urinating.  You have fluid or blood coming from your incision.  You have pus or a bad smell coming from the incision.  Your incision feels warm to the touch.  You have redness, swelling, or pain around your incision. Get help right away if:  You have a fever or chills.  Your incision separates or opens.  You cannot urinate.  You have trouble breathing. Summary  After the procedure, it is common to have pain, fatigue, and  discharge from the vagina.  Keep the area between your vagina and rectum (perineal area) clean and dry. Make sure you clean the area after each bowel movement and each time you urinate.  Follow instructions from your health care provider on any activity restrictions after the procedure. This information is not intended to replace advice given to you by your health care provider. Make sure you discuss any questions you have with your health care provider. Document Revised: 07/20/2017 Document Reviewed: 08/07/2016 Elsevier Patient Education  2020 [place]   1) The drugs that you were given will stay in your system until tomorrow so for the next 24 hours you should not:  A) Drive an automobile B) Make any legal decisions C) Drink any alcoholic beverage   2) You may resume regular meals tomorrow.  Today it is better to start with liquids and gradually work up to solid foods.  You may eat anything you prefer, but it is better to start with liquids, then soup and crackers, and gradually work up to solid foods.   3) Please notify your doctor immediately if you have any unusual bleeding, trouble breathing, redness and pain at the surgery site, drainage, fever, or pain not relieved by medication.    4) Additional Instructions:        Please contact your physician with any problems or Same Day Surgery at [phone], Monday through Friday 6 am to 4 pm, or [place] at [facility] number at [phone].

## 2019-09-11 NOTE — Op Note (Signed)
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  COMPLICATIONS: None  DISPOSITION: stable to PACU  FINDINGS: Cystocele and mild vaginal vault prolapse, Min rectocele, no mass  PROCEDURE:   Patient was taken to the OR where she was placed in dorsal lithotomy in [place] stirrups. She was prepped and draped in the usual sterile fashion. A timeout was performed. Foley is placed into bladder. A speculum was placed inside the vagina. Above findings noted.   Anterior colporrhaphy is performed.  Allis clamps are placed along the anterior vaginal wall, lidocaine is used to infiltrate the plane, and incision is made midline vertical.  Endopelvic fascia is dissected free of vaginal mucosa.  Fascia is plicated w interrupted vicryl sutures.  Excess mucosa is excised.  Vaginal incision is closed with a running locking Vicryl suture.  Excellent hemostasis was noted at the end of the case.   Excellent hemostasis was noted at the end of the case.    Packing gauze w AVC cream is placed. A Foley catheter is left in  place inside her bladder. Clear, yellow urine was noted. All instrument needle and lap counts were correct x 2. Patient was awakened taken to recovery room in stable condition.  Barnett Applebaum, MD, [facility], [facility] 09/11/2019  12:22 PM

## 2019-09-11 NOTE — Anesthesia Procedure Notes (Signed)
Procedure Name: LMA Insertion Date/Time: 09/11/2019 11:08 AM Performed by: Justus Memory, CRNA Pre-anesthesia Checklist: Patient identified, Patient being monitored, Timeout performed, Emergency Drugs available and Suction available Patient Re-evaluated:Patient Re-evaluated prior to induction Oxygen Delivery Method: Circle system utilized Preoxygenation: Pre-oxygenation with 100% oxygen Induction Type: IV induction Ventilation: Mask ventilation without difficulty LMA: LMA inserted LMA Size: 3.5 Tube type: Oral Number of attempts: 1 Placement Confirmation: positive ETCO2 and breath sounds checked- equal and bilateral Tube secured with: Tape Dental Injury: Teeth and Oropharynx as per pre-operative assessment

## 2019-09-11 NOTE — Transfer of Care (Signed)
Immediate Anesthesia Transfer of Care Note  Patient: Gwendolyn King  Procedure(s) Performed: ANTERIOR COLPORRHAPHY (N/A )  Patient Location: PACU  Anesthesia Type:General  Level of Consciousness: sedated  Airway & Oxygen Therapy: Patient Spontanous Breathing and Patient connected to face mask oxygen  Post-op Assessment: Report given to RN and Post -op Vital signs reviewed and stable  Post vital signs: Reviewed and stable  Last Vitals:  Vitals Value Taken Time  BP 158/57 09/11/19 1226  Temp 36.1 C 09/11/19 1226  Pulse 61 09/11/19 1239  Resp 12 09/11/19 1240  SpO2 98 % 09/11/19 1239  Vitals shown include unvalidated device data.  Last Pain:  Vitals:   09/11/19 1226  TempSrc:   PainSc: Asleep         Complications: No apparent anesthesia complications

## 2019-09-12 NOTE — Anesthesia Postprocedure Evaluation (Signed)
Anesthesia Post Note  Patient: Gwendolyn King  Procedure(s) Performed: ANTERIOR COLPORRHAPHY (N/A )  Patient location during evaluation: PACU Anesthesia Type: General Level of consciousness: awake and alert Pain management: pain level controlled Vital Signs Assessment: post-procedure vital signs reviewed and stable Respiratory status: spontaneous breathing and respiratory function stable Cardiovascular status: stable Anesthetic complications: no     Last Vitals:  Vitals:   09/11/19 1454 09/11/19 1500  BP: (!) 168/56 (!) 149/49  Pulse: 68 61  Resp: 16   Temp: (!) 36.1 C   SpO2: 99%     Last Pain:  Vitals:   09/12/19 0815  TempSrc:   PainSc: 3                  Gwendolyn King K

## 2019-09-15 ENCOUNTER — Other Ambulatory Visit: Payer: PPO

## 2019-09-16 ENCOUNTER — Other Ambulatory Visit: Payer: PPO

## 2019-09-16 DIAGNOSIS — J3081 Allergic rhinitis due to animal (cat) (dog) hair and dander: Secondary | ICD-10-CM | POA: Diagnosis not present

## 2019-09-16 DIAGNOSIS — J301 Allergic rhinitis due to pollen: Secondary | ICD-10-CM | POA: Diagnosis not present

## 2019-09-16 DIAGNOSIS — J3089 Other allergic rhinitis: Secondary | ICD-10-CM | POA: Diagnosis not present

## 2019-09-23 ENCOUNTER — Ambulatory Visit (INDEPENDENT_AMBULATORY_CARE_PROVIDER_SITE_OTHER): Payer: PPO | Admitting: Obstetrics & Gynecology

## 2019-09-23 ENCOUNTER — Ambulatory Visit: Payer: PPO | Admitting: Obstetrics & Gynecology

## 2019-09-23 ENCOUNTER — Other Ambulatory Visit: Payer: Self-pay

## 2019-09-23 ENCOUNTER — Encounter: Payer: Self-pay | Admitting: Obstetrics & Gynecology

## 2019-09-23 VITALS — BP 130/80 | Ht 60.0 in | Wt 160.0 lb

## 2019-09-23 DIAGNOSIS — N819 Female genital prolapse, unspecified: Secondary | ICD-10-CM

## 2019-09-23 DIAGNOSIS — J3081 Allergic rhinitis due to animal (cat) (dog) hair and dander: Secondary | ICD-10-CM | POA: Diagnosis not present

## 2019-09-23 DIAGNOSIS — J301 Allergic rhinitis due to pollen: Secondary | ICD-10-CM | POA: Diagnosis not present

## 2019-09-23 DIAGNOSIS — Z48816 Encounter for surgical aftercare following surgery on the genitourinary system: Secondary | ICD-10-CM

## 2019-09-23 DIAGNOSIS — N8111 Cystocele, midline: Secondary | ICD-10-CM

## 2019-09-23 DIAGNOSIS — J3089 Other allergic rhinitis: Secondary | ICD-10-CM | POA: Diagnosis not present

## 2019-09-23 NOTE — Progress Notes (Signed)
  Postoperative Follow-up Patient presents post op from anterior colporrhaphy for pelvic relaxation, 1 week ago.  Subjective: Patient reports some improvement in her preop symptoms. Eating a regular diet without difficulty. The patient is not having any pain.  Activity: normal activities of daily living. Patient reports additional symptom's since surgery of No bleeding.  Bladder function improved  Objective: There were no vitals taken for this visit. Physical Exam Constitutional:      General: She is not in acute distress.    Appearance: She is well-developed.  Musculoskeletal:        General: Normal range of motion.  Neurological:     Mental Status: She is alert and oriented to person, place, and time.  Skin:    General: Skin is warm and dry.  Vitals reviewed.    Assessment: s/p :  anterior colporrhaphy progressing well  Plan: Patient has done well after surgery with no apparent complications.  I have discussed the post-operative course to date, and the expected progress moving forward.  The patient understands what complications to be concerned about.  I will see the patient in routine follow up, or sooner if needed.    Activity plan: No heavy lifting.  Pelvic rest.  Hoyt Koch 09/23/2019, 9:53 AM

## 2019-09-30 ENCOUNTER — Ambulatory Visit: Payer: PPO

## 2019-09-30 DIAGNOSIS — J301 Allergic rhinitis due to pollen: Secondary | ICD-10-CM | POA: Diagnosis not present

## 2019-09-30 DIAGNOSIS — J3089 Other allergic rhinitis: Secondary | ICD-10-CM | POA: Diagnosis not present

## 2019-09-30 DIAGNOSIS — J3081 Allergic rhinitis due to animal (cat) (dog) hair and dander: Secondary | ICD-10-CM | POA: Diagnosis not present

## 2019-10-02 ENCOUNTER — Ambulatory Visit: Payer: PPO | Attending: Internal Medicine

## 2019-10-02 DIAGNOSIS — Z23 Encounter for immunization: Secondary | ICD-10-CM | POA: Insufficient documentation

## 2019-10-02 NOTE — Progress Notes (Signed)
   Covid-19 Vaccination Clinic  Name:  Gwendolyn King    MRN: [id number] DOB: 09-Aug-1941  10/02/2019  [name] was observed post Covid-19 immunization for 15 minutes without incidence. She was provided with Vaccine Information Sheet and instruction to access the V-Safe system.   [name] was instructed to call 911 with any severe reactions post vaccine: Marland Kitchen Difficulty breathing  . Swelling of your face and throat  . A fast heartbeat  . A bad rash all over your body  . Dizziness and weakness    Immunizations Administered    Name Date Dose VIS Date Route   Pfizer COVID-19 Vaccine 10/02/2019  9:15 AM 0.3 mL 08/01/2019 Intramuscular   Manufacturer: [place]   Lot: [id number]   [place]: [id number]

## 2019-10-07 DIAGNOSIS — J3081 Allergic rhinitis due to animal (cat) (dog) hair and dander: Secondary | ICD-10-CM | POA: Diagnosis not present

## 2019-10-07 DIAGNOSIS — J3089 Other allergic rhinitis: Secondary | ICD-10-CM | POA: Diagnosis not present

## 2019-10-07 DIAGNOSIS — J301 Allergic rhinitis due to pollen: Secondary | ICD-10-CM | POA: Diagnosis not present

## 2019-10-14 DIAGNOSIS — J301 Allergic rhinitis due to pollen: Secondary | ICD-10-CM | POA: Diagnosis not present

## 2019-10-14 DIAGNOSIS — J3081 Allergic rhinitis due to animal (cat) (dog) hair and dander: Secondary | ICD-10-CM | POA: Diagnosis not present

## 2019-10-14 DIAGNOSIS — J3089 Other allergic rhinitis: Secondary | ICD-10-CM | POA: Diagnosis not present

## 2019-10-15 ENCOUNTER — Other Ambulatory Visit: Payer: Self-pay

## 2019-10-16 ENCOUNTER — Ambulatory Visit
Admission: RE | Admit: 2019-10-16 | Discharge: 2019-10-16 | Disposition: A | Discharge status: Home / Self Care | Payer: PPO | Source: Ambulatory Visit | Attending: Radiation Oncology | Admitting: Radiation Oncology

## 2019-10-16 ENCOUNTER — Other Ambulatory Visit: Payer: Self-pay

## 2019-10-16 VITALS — BP 141/65 | HR 69 | Temp 96.7°F | Resp 16 | Wt 160.8 lb

## 2019-10-16 DIAGNOSIS — Z923 Personal history of irradiation: Secondary | ICD-10-CM | POA: Diagnosis not present

## 2019-10-16 DIAGNOSIS — Z79811 Long term (current) use of aromatase inhibitors: Secondary | ICD-10-CM | POA: Diagnosis not present

## 2019-10-16 DIAGNOSIS — D0511 Intraductal carcinoma in situ of right breast: Secondary | ICD-10-CM | POA: Insufficient documentation

## 2019-10-16 DIAGNOSIS — Z170 Estrogen receptor positive status [ER+]: Secondary | ICD-10-CM | POA: Diagnosis not present

## 2019-10-16 DIAGNOSIS — C50511 Malignant neoplasm of lower-outer quadrant of right female breast: Secondary | ICD-10-CM

## 2019-10-16 NOTE — Progress Notes (Signed)
Radiation Oncology Follow up Note  Name: Gwendolyn King   Date:   10/16/2019 MRN:  [id number] DOB: [birth year]    This 78 y.o. 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  Seen today in routine follow-up she is doing well.  She had a mammogram back in October.  Showing new coarse heterogeneous liver oriented calcifications in the inferior central right breast at the prior lumpectomy site.  This was biopsied and was benign mammary tissue compatible with prior surgical site negative for atypical proliferative breast disease.  She is currently on Femara tolerate well without side effect.  COMPLICATIONS OF TREATMENT: none  FOLLOW UP COMPLIANCE: keeps appointments   PHYSICAL EXAM:  BP (!) 141/65   Pulse 69   Temp (!) 96.7 F (35.9 C)   Resp 16   Wt 160 lb 12.8 oz (72.9 kg)   SpO2 97%   BMI 31.40 kg/m  Lungs are clear to A&P cardiac examination essentially unremarkable with regular rate and rhythm. No dominant mass or nodularity is noted in either breast in 2 positions examined. Incision is well-healed. No axillary or supraclavicular adenopathy is appreciated. Cosmetic result is excellent.  Right breast is noticeably smaller than the left breast although the cosmetic result is still good to excellent.  Well-developed well-nourished patient in NAD. HEENT reveals PERLA, EOMI, discs not visualized.  Oral cavity is clear. No oral mucosal lesions are identified. Neck is clear without evidence of cervical or supraclavicular adenopathy. Lungs are clear to A&P. Cardiac examination is essentially unremarkable with regular rate  and rhythm without murmur rub or thrill. Abdomen is benign with no organomegaly or masses noted. Motor sensory and DTR levels are equal and symmetric in the upper and lower extremities. Cranial nerves II through XII are grossly intact. Proprioception is intact. No peripheral adenopathy or edema is identified. No motor or sensory levels are noted. Crude visual fields are within normal range.  RADIOLOGY RESULTS: Mammograms reviewed compatible with above-stated findings  PLAN: Present time she continues to do well 2 years out with no evidence of disease.  I am pleased with her overall progress.  I have asked to see her back in 1 year for follow-up.  She continues on Femara without side effect.  Patient knows to call with any concerns.  I would like to take this opportunity to thank you for allowing me to participate in the care of your patient.Noreene Filbert, MD

## 2019-10-21 DIAGNOSIS — J3089 Other allergic rhinitis: Secondary | ICD-10-CM | POA: Diagnosis not present

## 2019-10-21 DIAGNOSIS — J3081 Allergic rhinitis due to animal (cat) (dog) hair and dander: Secondary | ICD-10-CM | POA: Diagnosis not present

## 2019-10-21 DIAGNOSIS — J301 Allergic rhinitis due to pollen: Secondary | ICD-10-CM | POA: Diagnosis not present

## 2019-10-23 ENCOUNTER — Other Ambulatory Visit: Payer: Self-pay

## 2019-10-24 ENCOUNTER — Other Ambulatory Visit: Payer: Self-pay

## 2019-10-24 ENCOUNTER — Encounter: Payer: Self-pay | Admitting: Family Medicine

## 2019-10-24 ENCOUNTER — Ambulatory Visit (INDEPENDENT_AMBULATORY_CARE_PROVIDER_SITE_OTHER): Payer: PPO | Admitting: Family Medicine

## 2019-10-24 DIAGNOSIS — I10 Essential (primary) hypertension: Secondary | ICD-10-CM | POA: Diagnosis not present

## 2019-10-24 DIAGNOSIS — E039 Hypothyroidism, unspecified: Secondary | ICD-10-CM | POA: Diagnosis not present

## 2019-10-24 DIAGNOSIS — E782 Mixed hyperlipidemia: Secondary | ICD-10-CM | POA: Diagnosis not present

## 2019-10-24 DIAGNOSIS — N8111 Cystocele, midline: Secondary | ICD-10-CM | POA: Diagnosis not present

## 2019-10-24 DIAGNOSIS — R238 Other skin changes: Secondary | ICD-10-CM | POA: Diagnosis not present

## 2019-10-24 NOTE — Assessment & Plan Note (Signed)
Continue Zetia. °

## 2019-10-24 NOTE — Assessment & Plan Note (Signed)
She will try stopping the Xyzal to see if that makes a difference.  She is going to miss a week of her allergy injections next week and she will see if that makes a difference as well.

## 2019-10-24 NOTE — Assessment & Plan Note (Signed)
Doing well status post surgery.  She will follow up with GYN as planned.

## 2019-10-24 NOTE — Assessment & Plan Note (Signed)
Continue Levoxyl

## 2019-10-24 NOTE — Progress Notes (Signed)
  Tommi Rumps, MD Phone: [phone]  Gwendolyn King is a 79 y.o. female who presents today for follow-up.  Hypothyroidism: Taking Levoxyl.  She does note some skin irritation on her cheeks after putting on some lotion.  No other skin changes.  No heat or cold intolerance.  Skin irritation: This occurred after putting moisturizer on.  It burned a little bit when she did so.  She has been using that moisturizer for a long time.  She discussed with her allergist and they advised trying a different cleanser and lotion.  She wonders if it is related to her allergy medications or allergy shots.  Cystocele: Patient underwent surgery for this.  She feels much better than when she had the pessary.  She follows up with gynecology next week.  Hypertension: Not checking blood pressures.  Taking acebutolol.  No chest pain or shortness of breath.  Hyperlipidemia: Taking Zetia.  No chest pain, right upper quadrant pain, or myalgias.  Social History   Tobacco Use  Smoking Status Never Smoker  Smokeless Tobacco Never Used     ROS see history of present illness  Objective  Physical Exam Vitals:   10/24/19 1014  BP: 130/80  Pulse: (!) 56  Temp: (!) 95.7 F (35.4 C)  SpO2: 98%    BP Readings from Last 3 Encounters:  10/24/19 130/80  10/16/19 (!) 141/65  09/23/19 130/80   Wt Readings from Last 3 Encounters:  10/24/19 159 lb 6.4 oz (72.3 kg)  10/16/19 160 lb 12.8 oz (72.9 kg)  09/23/19 160 lb (72.6 kg)    Physical Exam Constitutional:      General: She is not in acute distress.    Appearance: She is not diaphoretic.  HENT:     Head:     Comments: No apparent skin irritation on her cheeks Cardiovascular:     Rate and Rhythm: Normal rate and regular rhythm.     Heart sounds: Normal heart sounds.  Pulmonary:     Effort: Pulmonary effort is normal.     Breath sounds: Normal breath sounds.  Musculoskeletal:     Right lower leg: No edema.     Left lower leg: No edema.   Skin:    General: Skin is warm and dry.  Neurological:     Mental Status: She is alert.      Assessment/Plan: Please see individual problem list.  Hypertension Adequately controlled.  Continue current medication.  Hypothyroidism Continue Levoxyl.  Skin irritation She will try stopping the Xyzal to see if that makes a difference.  She is going to miss a week of her allergy injections next week and she will see if that makes a difference as well.  Cystocele, midline Doing well status post surgery.  She will follow up with GYN as planned.  Hyperlipidemia Continue Zetia.   No orders of the defined types were placed in this encounter.   No orders of the defined types were placed in this encounter.   This visit occurred during the SARS-CoV-2 public health emergency.  Safety protocols were in place, including screening questions prior to the visit, additional usage of staff PPE, and extensive cleaning of exam room while observing appropriate contact time as indicated for disinfecting solutions.    Tommi Rumps, MD [place]

## 2019-10-24 NOTE — Patient Instructions (Signed)
Nice to see you. Please try coming off of Xyzal to see if that helps with her skin irritation.

## 2019-10-24 NOTE — Assessment & Plan Note (Signed)
Adequately controlled.  Continue current medication. 

## 2019-10-28 ENCOUNTER — Ambulatory Visit: Payer: PPO | Attending: Internal Medicine

## 2019-10-28 DIAGNOSIS — Z23 Encounter for immunization: Secondary | ICD-10-CM | POA: Insufficient documentation

## 2019-10-28 NOTE — Progress Notes (Signed)
   Covid-19 Vaccination Clinic  Name:  Gwendolyn King    MRN: [id number] DOB: 08/14/1941  10/28/2019  Gwendolyn King was observed post Covid-19 immunization for 15 minutes without incident. She was provided with Vaccine Information Sheet and instruction to access the V-Safe system.   Gwendolyn King was instructed to call 911 with any severe reactions post vaccine: Marland Kitchen Difficulty breathing  . Swelling of face and throat  . A fast heartbeat  . A bad rash all over body  . Dizziness and weakness   Immunizations Administered    Name Date Dose VIS Date Route   Pfizer COVID-19 Vaccine 10/28/2019  3:15 PM 0.3 mL 08/01/2019 Intramuscular   Manufacturer: [place]   Lot: [id number]   [place]: [id number]

## 2019-10-31 ENCOUNTER — Ambulatory Visit (INDEPENDENT_AMBULATORY_CARE_PROVIDER_SITE_OTHER): Payer: PPO | Admitting: Obstetrics & Gynecology

## 2019-10-31 ENCOUNTER — Encounter: Payer: Self-pay | Admitting: Obstetrics & Gynecology

## 2019-10-31 ENCOUNTER — Other Ambulatory Visit: Payer: Self-pay

## 2019-10-31 VITALS — BP 120/80 | Ht 60.05 in | Wt 161.0 lb

## 2019-10-31 DIAGNOSIS — N8111 Cystocele, midline: Secondary | ICD-10-CM

## 2019-10-31 NOTE — Progress Notes (Signed)
  Postoperative Follow-up Patient presents post op from anterior colporrhaphy for pelvic relaxation, 6 weeks ago.  Subjective: Patient reports marked improvement in her preop symptoms. Eating a regular diet without difficulty. The patient is not having any pain.  Activity: normal activities of daily living. Patient reports additional symptom's since surgery of None.  Objective: BP 120/80   Ht 5' 0.05" (1.525 m)   Wt 161 lb (73 kg)   BMI 31.39 kg/m  Physical Exam Constitutional:      General: She is not in acute distress.    Appearance: She is well-developed.  Genitourinary:     Pelvic exam was performed with patient supine.     Vagina and rectum normal.     No vaginal erythema or bleeding.     No right or left adnexal mass present.     Right adnexa not tender.     Left adnexa not tender.     Genitourinary Comments: Healing well along anterior vaginal wall although not complete  Cardiovascular:     Rate and Rhythm: Normal rate.  Pulmonary:     Effort: Pulmonary effort is normal.  Abdominal:     General: There is no distension.     Palpations: Abdomen is soft.     Tenderness: There is no abdominal tenderness.  Musculoskeletal:        General: Normal range of motion.  Neurological:     Mental Status: She is alert and oriented to person, place, and time.     Cranial Nerves: No cranial nerve deficit.  Skin:    General: Skin is warm and dry.     Assessment: s/p :  anterior colporrhaphy progressing well  Plan: Patient has done well after surgery with no apparent complications.  I have discussed the post-operative course to date, and the expected progress moving forward.  The patient understands what complications to be concerned about.  I will see the patient in routine follow up, or sooner if needed.    Activity plan: No restriction. Pelvic rest for a dew more weeks for completion of healing.  Hoyt Koch 10/31/2019, 1:39 PM

## 2019-11-04 DIAGNOSIS — J3089 Other allergic rhinitis: Secondary | ICD-10-CM | POA: Diagnosis not present

## 2019-11-04 DIAGNOSIS — J3081 Allergic rhinitis due to animal (cat) (dog) hair and dander: Secondary | ICD-10-CM | POA: Diagnosis not present

## 2019-11-04 DIAGNOSIS — J301 Allergic rhinitis due to pollen: Secondary | ICD-10-CM | POA: Diagnosis not present

## 2019-11-05 ENCOUNTER — Other Ambulatory Visit: Payer: Self-pay

## 2019-11-05 DIAGNOSIS — I10 Essential (primary) hypertension: Secondary | ICD-10-CM

## 2019-11-05 MED ORDER — ACEBUTOLOL HCL 200 MG PO CAPS: 200.0000 mg | ORAL_CAPSULE | Freq: Every day | ORAL | 0 refills | Status: DC

## 2019-11-11 DIAGNOSIS — J301 Allergic rhinitis due to pollen: Secondary | ICD-10-CM | POA: Diagnosis not present

## 2019-11-11 DIAGNOSIS — J3089 Other allergic rhinitis: Secondary | ICD-10-CM | POA: Diagnosis not present

## 2019-11-17 DIAGNOSIS — J3081 Allergic rhinitis due to animal (cat) (dog) hair and dander: Secondary | ICD-10-CM | POA: Diagnosis not present

## 2019-11-17 DIAGNOSIS — J301 Allergic rhinitis due to pollen: Secondary | ICD-10-CM | POA: Diagnosis not present

## 2019-11-17 DIAGNOSIS — J3089 Other allergic rhinitis: Secondary | ICD-10-CM | POA: Diagnosis not present

## 2019-11-18 DIAGNOSIS — J3089 Other allergic rhinitis: Secondary | ICD-10-CM | POA: Diagnosis not present

## 2019-11-18 DIAGNOSIS — J3081 Allergic rhinitis due to animal (cat) (dog) hair and dander: Secondary | ICD-10-CM | POA: Diagnosis not present

## 2019-11-18 DIAGNOSIS — J301 Allergic rhinitis due to pollen: Secondary | ICD-10-CM | POA: Diagnosis not present

## 2019-11-22 ENCOUNTER — Encounter: Payer: Self-pay | Admitting: Oncology

## 2019-11-23 ENCOUNTER — Encounter: Payer: Self-pay | Admitting: Oncology

## 2019-11-25 DIAGNOSIS — J301 Allergic rhinitis due to pollen: Secondary | ICD-10-CM | POA: Diagnosis not present

## 2019-11-25 DIAGNOSIS — J3089 Other allergic rhinitis: Secondary | ICD-10-CM | POA: Diagnosis not present

## 2019-11-25 DIAGNOSIS — J3081 Allergic rhinitis due to animal (cat) (dog) hair and dander: Secondary | ICD-10-CM | POA: Diagnosis not present

## 2019-12-02 DIAGNOSIS — J3081 Allergic rhinitis due to animal (cat) (dog) hair and dander: Secondary | ICD-10-CM | POA: Diagnosis not present

## 2019-12-02 DIAGNOSIS — J3089 Other allergic rhinitis: Secondary | ICD-10-CM | POA: Diagnosis not present

## 2019-12-02 DIAGNOSIS — J301 Allergic rhinitis due to pollen: Secondary | ICD-10-CM | POA: Diagnosis not present

## 2019-12-06 NOTE — Progress Notes (Signed)
[place]  [phone]) [phone] [phone]) [phone]  ID: Gwendolyn King OB: [birth year]  MR#: [id number]  HRC#:[id number]  Patient Care Team: Leone Haven, MD as PCP - General (Family Medicine) Minna Merritts, MD as Consulting Physician (Cardiology) Bary Castilla, Forest Gleason, MD (General Surgery)  CHIEF COMPLAINT: DCIS, right breast.  INTERVAL HISTORY: Patient returns to clinic today for routine 31-monthevaluation.  She currently feels well and is asymptomatic.  She continues to tolerate letrozole and Fosamax without significant side effects.  She recently had significant pruritus, but thinks this was related to her Singulair.  She otherwise has felt well.  She has no neurologic complaints.  She denies any recent fevers or illnesses.  She has a good appetite and denies weight loss. She denies any chest pain, shortness of breath, cough, or hemoptysis.  She denies any nausea, vomiting, constipation, or diarrhea.  She has no urinary complaints.  Patient offers no further specific complaints today.  REVIEW OF SYSTEMS:   Review of Systems  Constitutional: Negative.  Negative for diaphoresis, fever, malaise/fatigue and weight loss.  Respiratory: Negative.  Negative for cough and shortness of breath.   Cardiovascular: Negative.  Negative for chest pain and leg swelling.  Gastrointestinal: Negative.  Negative for abdominal pain and constipation.  Genitourinary: Negative.  Negative for dysuria.  Musculoskeletal: Negative.  Negative for back pain.  Skin: Positive for itching. Negative for rash.  Neurological: Negative.  Negative for sensory change, focal weakness and weakness.  Psychiatric/Behavioral: Negative.  The patient is not nervous/anxious.     As per HPI. Otherwise, a complete review of systems is negative.  PAST MEDICAL HISTORY: Past Medical History:  Diagnosis Date  . Allergy    hay fever, fall allergies  . Arthritis   . Asthma    as a child, dad was smoker   . 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  . Cataract    Bil  . Chicken pox   . Chronic kidney disease    stage 3  . Colon polyp   . Constipation   . Hyperlipidemia   . Hypothyroidism   . Palpitations    over 20 yeras ago  . Personal history of radiation therapy   . Thyroid disease     PAST SURGICAL HISTORY: Past Surgical History:  Procedure Laterality Date  . ABDOMINAL HYSTERECTOMY  1981  . APPENDECTOMY  2012  . 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 BREAST BIOPSY Right 05/17/2017   uKoreabx done at DR. Brynetts office, papillary carcinoma  . BREAST BIOPSY Right 06/18/2019   Stereo bc calcs path pending  . BREAST LUMPECTOMY Right 06/07/2017   DUCTAL CARCINOMA IN SITU (DCIS) with microinvasion at 7:00 5 cmfn  . BREAST LUMPECTOMY Right 06/07/2017   PAPILLARY LESION CONSISTENT WITH PAPILLARY CARCINOMA at retroaerolar 7:00 1.5 cmfn  . BREAST LUMPECTOMY WITH SENTINEL LYMPH NODE BIOPSY Right 06/07/2017   Wide excision of 2 foci of high-grade DCIS, microinvasion noted only on core biopsy. 2 microscopic foci lateral inferior margins. Patient elected not to proceed to reexcision.  . COLONOSCOPY    . COLONOSCOPY W/ POLYPECTOMY    . CYSTOCELE REPAIR N/A 09/11/2019   Procedure: ANTERIOR COLPORRHAPHY;  Surgeon: HGae Dry MD;  Location: [facility];  Service: Gynecology;  Laterality: N/A;  . FINGER FRACTURE SURGERY Right    5th  .  HAMMER TOE SURGERY  09/20/2017  . JOINT REPLACEMENT     right knee  . ROTATOR CUFF REPAIR Right   . TONSILLECTOMY AND ADENOIDECTOMY  1963  . TOTAL KNEE ARTHROPLASTY Right 10/17/2016   Procedure: TOTAL KNEE ARTHROPLASTY;  Surgeon: Melrose Nakayama, MD;  Location: [facility];  Service: Orthopedics;  Laterality: Right;  Marland Kitchen VAGINAL DELIVERY     3    FAMILY HISTORY: Family History  Problem  Relation Age of Onset  . Arthritis Mother   . Heart disease Mother   . Hypertension Mother   . Arthritis Father   . Heart disease Father   . Hypertension Father   . Heart attack Father   . Stroke Maternal Grandmother   . Cancer Maternal Aunt        abdominal?  . Hyperlipidemia Maternal Aunt   . Diabetes Paternal Aunt   . Glaucoma Brother   . Hyperlipidemia Other   . Diabetes Other   . Breast cancer Neg Hx     ADVANCED DIRECTIVES (Y/N):  N  HEALTH MAINTENANCE: Social History   Tobacco Use  . Smoking status: Never Smoker  . Smokeless tobacco: Never Used  Substance Use Topics  . Alcohol use: Yes    Comment: wine occ  . Drug use: No     Colonoscopy:  PAP:  Bone density:  Lipid panel:  Allergies  Allergen Reactions  . Lipitor [Atorvastatin] Other (See Comments)    Myalgia, arthralgia  . Simvastatin Other (See Comments)    myalgia    Current Outpatient Medications  Medication Sig Dispense Refill  . acebutolol (SECTRAL) 200 MG capsule Take 1 capsule (200 mg total) by mouth daily. 90 capsule 0  . acetaminophen (TYLENOL) 500 MG tablet Take 1,000 mg by mouth at bedtime.    Marland Kitchen alendronate (FOSAMAX) 35 MG tablet Take 2 tablets (70 mg total) by mouth every 7 (seven) days. Take with a full glass of water on an empty stomach. (Patient taking differently: Take 70 mg by mouth every Saturday. Take with a full glass of water on an empty stomach.) 24 tablet 3  . aspirin EC 81 MG tablet Take 81 mg by mouth at bedtime.     . AZELASTINE HCL OP Place 1 drop into both eyes 3 (three) times daily as needed (dry/irritated eyes.).    Marland Kitchen Biotin 5 MG TABS Take 5 mg by mouth daily.    . calcium-vitamin D (OSCAL WITH D) 500-200 MG-UNIT tablet Take 1 tablet by mouth daily.     . cholecalciferol (VITAMIN D) 25 MCG (1000 UNIT) tablet Take 1,000 Units by mouth every evening.    Marland Kitchen EPINEPHrine 0.3 mg/0.3 mL IJ SOAJ injection Inject 0.3 mg into the muscle as needed for anaphylaxis.    Marland Kitchen ezetimibe  (ZETIA) 10 MG tablet Take 1 tablet (10 mg total) by mouth daily. (Patient taking differently: Take 10 mg by mouth every evening. ) 90 tablet 3  . fluticasone (FLONASE) 50 MCG/ACT nasal spray Place 1 spray into both nostrils daily as needed (allergies.).   3  . Krill Oil 500 MG CAPS Take 500 mg by mouth daily.    Marland Kitchen letrozole (FEMARA) 2.5 MG tablet Take 1 tablet (2.5 mg total) by mouth daily. 90 tablet 3  . levocetirizine (XYZAL) 5 MG tablet Take 5 mg by mouth every evening.   3  . LEVOXYL 75 MCG tablet Take 1 tablet by mouth once daily (Patient taking differently: Take 75 mcg by mouth daily before breakfast. Take 1  tablet by mouth once daily) 90 tablet 2  . montelukast (SINGULAIR) 10 MG tablet Take 10 mg by mouth every morning.   3  . Multiple Vitamin (MULTIVITAMIN WITH MINERALS) TABS tablet Take 1 tablet by mouth daily. Adult 50+    . polyethylene glycol powder (GLYCOLAX/MIRALAX) powder Take 17 g by mouth daily. With coffee    . vitamin B-12 (CYANOCOBALAMIN) 1000 MCG tablet Take 1,000 mcg by mouth daily.     No current facility-administered medications for this visit.    OBJECTIVE: Vitals:   12/12/19 1427  BP: (!) 127/41  Pulse: (!) 59  Resp: 20  Temp: (!) 96.2 F (35.7 C)  SpO2: 100%     Body mass index is 30.9 kg/m.    ECOG FS:0 - Asymptomatic  General: Well-developed, well-nourished, no acute distress. Eyes: Pink conjunctiva, anicteric sclera. HEENT: Normocephalic, moist mucous membranes. Breast: Exam deferred today. Lungs: No audible wheezing or coughing. Heart: Regular rate and rhythm. Abdomen: Soft, nontender, no obvious distention. Musculoskeletal: No edema, cyanosis, or clubbing. Neuro: Alert, answering all questions appropriately. Cranial nerves grossly intact. Skin: No rashes or petechiae noted. Psych: Normal affect.   LAB RESULTS:  Lab Results  Component Value Date   NA 142 04/21/2019   K 4.4 04/21/2019   CL 106 04/21/2019   CO2 30 04/21/2019   GLUCOSE 85  04/21/2019   BUN 20 04/21/2019   CREATININE 1.15 04/21/2019   CALCIUM 10.3 04/21/2019   PROT 6.4 04/21/2019   ALBUMIN 4.1 04/21/2019   AST 17 04/21/2019   ALT 14 04/21/2019   ALKPHOS 53 04/21/2019   BILITOT 0.6 04/21/2019   GFRNONAA 42 (L) 06/01/2017   GFRAA 49 (L) 06/01/2017    Lab Results  Component Value Date   WBC 5.3 09/09/2019   NEUTROABS 2.6 06/01/2017   HGB 14.4 09/09/2019   HCT 43.0 09/09/2019   MCV 87.0 09/09/2019   PLT 132 (L) 09/09/2019     STUDIES: No results found.  ASSESSMENT: DCIS, right breast  PLAN:    1. DCIS, right breast: Patient underwent lumpectomy on June 07, 2017.  Final pathology was reviewed and no invasive component was noted, therefore patient did not require chemotherapy.  She completed adjuvant XRT in January 2019.  Because of patient's persistent hot flashes, tamoxifen was discontinued and patient was initiated on letrozole.  Patient expressed understanding that although tamoxifen is the recommended treatment, there is still significant benefit with aromatase inhibitors.  Continue treatment for a total of 5 years completing in January 2024.  Patient's most recent mammogram on June 10, 2019 was reported as BI-RADS 4, but subsequent biopsy did not reveal any malignancy.  Repeat mammogram in October 2021.  Patient will have a video assisted telemedicine visit in 6 months for routine evaluation. 2.  Thrombocytopenia: Chronic and unchanged. 3.  Osteoporosis: Patient's most recent bone mineral density on June 10, 2019 reported T score of -2.7 which is slightly worse than 1 year prior with a T score reported -2.4.  Continue Fosamax, calcium, and vitamin D supplementation.  Repeat bone mineral density in October 2021 along with mammogram as above.  4.  Pruritus: Resolved.   Patient expressed understanding and was in agreement with this plan. She also understands that She can call clinic at any time with any questions, concerns, or complaints.    Cancer Staging Ductal carcinoma in situ (DCIS) of right breast Staging form: Breast, AJCC 8th Edition - Clinical stage from 08/22/2017: Stage 0 (cTis (DCIS), cN0, cM0, ER: Positive,  PR: Negative, HER2: Negative) - Signed by Lloyd Huger, MD on 08/22/2017   Lloyd Huger, MD   12/12/2019 5:03 PM

## 2019-12-09 DIAGNOSIS — J3089 Other allergic rhinitis: Secondary | ICD-10-CM | POA: Diagnosis not present

## 2019-12-09 DIAGNOSIS — J301 Allergic rhinitis due to pollen: Secondary | ICD-10-CM | POA: Diagnosis not present

## 2019-12-09 DIAGNOSIS — J3081 Allergic rhinitis due to animal (cat) (dog) hair and dander: Secondary | ICD-10-CM | POA: Diagnosis not present

## 2019-12-12 ENCOUNTER — Encounter: Payer: Self-pay | Admitting: Oncology

## 2019-12-12 ENCOUNTER — Other Ambulatory Visit: Payer: Self-pay

## 2019-12-12 ENCOUNTER — Inpatient Hospital Stay: Payer: PPO | Attending: Oncology | Admitting: Oncology

## 2019-12-12 VITALS — BP 127/41 | HR 59 | Temp 96.2°F | Resp 20 | Wt 158.5 lb

## 2019-12-12 DIAGNOSIS — Z809 Family history of malignant neoplasm, unspecified: Secondary | ICD-10-CM | POA: Diagnosis not present

## 2019-12-12 DIAGNOSIS — D696 Thrombocytopenia, unspecified: Secondary | ICD-10-CM | POA: Insufficient documentation

## 2019-12-12 DIAGNOSIS — Z90710 Acquired absence of both cervix and uterus: Secondary | ICD-10-CM | POA: Insufficient documentation

## 2019-12-12 DIAGNOSIS — M810 Age-related osteoporosis without current pathological fracture: Secondary | ICD-10-CM | POA: Diagnosis not present

## 2019-12-12 DIAGNOSIS — Z96651 Presence of right artificial knee joint: Secondary | ICD-10-CM | POA: Insufficient documentation

## 2019-12-12 DIAGNOSIS — Z923 Personal history of irradiation: Secondary | ICD-10-CM | POA: Insufficient documentation

## 2019-12-12 DIAGNOSIS — D0511 Intraductal carcinoma in situ of right breast: Secondary | ICD-10-CM | POA: Diagnosis present

## 2019-12-12 DIAGNOSIS — Z79811 Long term (current) use of aromatase inhibitors: Secondary | ICD-10-CM | POA: Diagnosis not present

## 2019-12-12 MED ORDER — LETROZOLE 2.5 MG PO TABS: 2.5000 mg | ORAL_TABLET | Freq: Every day | ORAL | 3 refills | Status: DC

## 2019-12-12 NOTE — Progress Notes (Signed)
Patient here today for follow up. Denies any pain. Reports she has stopped taking some of her medications because of episode of itching she recently experienced. She also request refill on her femara. Femara has been sent to patient's pharmacy. Reports no other concerns at this time.

## 2019-12-16 DIAGNOSIS — J3081 Allergic rhinitis due to animal (cat) (dog) hair and dander: Secondary | ICD-10-CM | POA: Diagnosis not present

## 2019-12-16 DIAGNOSIS — J301 Allergic rhinitis due to pollen: Secondary | ICD-10-CM | POA: Diagnosis not present

## 2019-12-16 DIAGNOSIS — J3089 Other allergic rhinitis: Secondary | ICD-10-CM | POA: Diagnosis not present

## 2019-12-23 DIAGNOSIS — J301 Allergic rhinitis due to pollen: Secondary | ICD-10-CM | POA: Diagnosis not present

## 2019-12-23 DIAGNOSIS — J3081 Allergic rhinitis due to animal (cat) (dog) hair and dander: Secondary | ICD-10-CM | POA: Diagnosis not present

## 2019-12-23 DIAGNOSIS — J3089 Other allergic rhinitis: Secondary | ICD-10-CM | POA: Diagnosis not present

## 2019-12-30 DIAGNOSIS — J301 Allergic rhinitis due to pollen: Secondary | ICD-10-CM | POA: Diagnosis not present

## 2019-12-30 DIAGNOSIS — J3089 Other allergic rhinitis: Secondary | ICD-10-CM | POA: Diagnosis not present

## 2019-12-30 DIAGNOSIS — J3081 Allergic rhinitis due to animal (cat) (dog) hair and dander: Secondary | ICD-10-CM | POA: Diagnosis not present

## 2020-01-06 DIAGNOSIS — J3081 Allergic rhinitis due to animal (cat) (dog) hair and dander: Secondary | ICD-10-CM | POA: Diagnosis not present

## 2020-01-06 DIAGNOSIS — J301 Allergic rhinitis due to pollen: Secondary | ICD-10-CM | POA: Diagnosis not present

## 2020-01-06 DIAGNOSIS — J3089 Other allergic rhinitis: Secondary | ICD-10-CM | POA: Diagnosis not present

## 2020-01-07 DIAGNOSIS — H2513 Age-related nuclear cataract, bilateral: Secondary | ICD-10-CM | POA: Diagnosis not present

## 2020-01-08 ENCOUNTER — Other Ambulatory Visit: Payer: Self-pay

## 2020-01-08 DIAGNOSIS — I10 Essential (primary) hypertension: Secondary | ICD-10-CM

## 2020-01-08 MED ORDER — ACEBUTOLOL HCL 200 MG PO CAPS: 200.0000 mg | ORAL_CAPSULE | Freq: Every day | ORAL | 0 refills | Status: DC

## 2020-01-08 NOTE — Progress Notes (Signed)
Fax came in for a refill of Acebutolol and this was sent to [place].  Oluwadamilola Rosamond,cma

## 2020-01-13 DIAGNOSIS — J3081 Allergic rhinitis due to animal (cat) (dog) hair and dander: Secondary | ICD-10-CM | POA: Diagnosis not present

## 2020-01-13 DIAGNOSIS — J301 Allergic rhinitis due to pollen: Secondary | ICD-10-CM | POA: Diagnosis not present

## 2020-01-13 DIAGNOSIS — J3089 Other allergic rhinitis: Secondary | ICD-10-CM | POA: Diagnosis not present

## 2020-01-15 ENCOUNTER — Telehealth: Payer: Self-pay

## 2020-01-16 NOTE — Telephone Encounter (Signed)
Can you check with the patient and see if there was a reason she was previously changed from levothyroxine to levoxyl? It looks like this was done when she previously saw Dr Gilford Rile here.

## 2020-01-16 NOTE — Telephone Encounter (Signed)
Patient states that She has always been on Levoxyl because Dr. Gilford Rile stated the generic ingredients are not consistent. Patient stated this medication use to be a tier 1 and now it is a tier 3, she stated it does not matter to her if she gets generic or not she just knows that she needs no more refills sent until October at this time.  Ohanna Gassert,cma

## 2020-01-16 NOTE — Telephone Encounter (Signed)
Noted.  Given that she has enough medication we can readdress at her follow-up in September.  We will plan to switch her to generic levothyroxine at that time for her next refill.

## 2020-01-20 DIAGNOSIS — J3081 Allergic rhinitis due to animal (cat) (dog) hair and dander: Secondary | ICD-10-CM | POA: Diagnosis not present

## 2020-01-20 DIAGNOSIS — J3089 Other allergic rhinitis: Secondary | ICD-10-CM | POA: Diagnosis not present

## 2020-01-20 DIAGNOSIS — J301 Allergic rhinitis due to pollen: Secondary | ICD-10-CM | POA: Diagnosis not present

## 2020-01-20 NOTE — Telephone Encounter (Signed)
We do not need the PA at this time. I have follow-up with her later this year and we will plan to transition her to generic levothyroxine at that time.

## 2020-01-24 ENCOUNTER — Encounter: Payer: Self-pay | Admitting: Family Medicine

## 2020-01-27 ENCOUNTER — Encounter: Payer: Self-pay | Admitting: Family Medicine

## 2020-01-27 ENCOUNTER — Ambulatory Visit (INDEPENDENT_AMBULATORY_CARE_PROVIDER_SITE_OTHER): Payer: PPO | Admitting: Family Medicine

## 2020-01-27 DIAGNOSIS — M79604 Pain in right leg: Secondary | ICD-10-CM

## 2020-01-27 DIAGNOSIS — J3081 Allergic rhinitis due to animal (cat) (dog) hair and dander: Secondary | ICD-10-CM | POA: Diagnosis not present

## 2020-01-27 DIAGNOSIS — S81801D Unspecified open wound, right lower leg, subsequent encounter: Secondary | ICD-10-CM | POA: Insufficient documentation

## 2020-01-27 DIAGNOSIS — J301 Allergic rhinitis due to pollen: Secondary | ICD-10-CM | POA: Diagnosis not present

## 2020-01-27 DIAGNOSIS — J3089 Other allergic rhinitis: Secondary | ICD-10-CM | POA: Diagnosis not present

## 2020-01-27 NOTE — Patient Instructions (Addendum)
Nice to see you. Please keep an eye on your leg.  If there is any spreading redness, warmth, or drainage please seek medical attention.  She needs to
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
## 2020-01-27 NOTE — Assessment & Plan Note (Signed)
Related to injury from a tire.  Appears to be healing well.  Discussed that the swelling and bruising is likely dependent edema and bruising following gravity.  She will monitor.  If it worsens or she develops signs of infection she will let us know.

## 2020-02-03 DIAGNOSIS — J301 Allergic rhinitis due to pollen: Secondary | ICD-10-CM | POA: Diagnosis not present

## 2020-02-03 DIAGNOSIS — J3081 Allergic rhinitis due to animal (cat) (dog) hair and dander: Secondary | ICD-10-CM | POA: Diagnosis not present

## 2020-02-03 DIAGNOSIS — J3089 Other allergic rhinitis: Secondary | ICD-10-CM | POA: Diagnosis not present

## 2020-02-10 DIAGNOSIS — J301 Allergic rhinitis due to pollen: Secondary | ICD-10-CM | POA: Diagnosis not present

## 2020-02-10 DIAGNOSIS — J3089 Other allergic rhinitis: Secondary | ICD-10-CM | POA: Diagnosis not present

## 2020-02-10 DIAGNOSIS — J3081 Allergic rhinitis due to animal (cat) (dog) hair and dander: Secondary | ICD-10-CM | POA: Diagnosis not present

## 2020-02-12 ENCOUNTER — Encounter: Payer: Self-pay | Admitting: Family Medicine

## 2020-02-12 MED ORDER — EZETIMIBE 10 MG PO TABS: 10.0000 mg | ORAL_TABLET | Freq: Every day | ORAL | 3 refills | Status: DC

## 2020-02-17 DIAGNOSIS — J3089 Other allergic rhinitis: Secondary | ICD-10-CM | POA: Diagnosis not present

## 2020-02-17 DIAGNOSIS — J301 Allergic rhinitis due to pollen: Secondary | ICD-10-CM | POA: Diagnosis not present

## 2020-02-17 DIAGNOSIS — J3081 Allergic rhinitis due to animal (cat) (dog) hair and dander: Secondary | ICD-10-CM | POA: Diagnosis not present

## 2020-02-24 DIAGNOSIS — J301 Allergic rhinitis due to pollen: Secondary | ICD-10-CM | POA: Diagnosis not present

## 2020-02-24 DIAGNOSIS — J3081 Allergic rhinitis due to animal (cat) (dog) hair and dander: Secondary | ICD-10-CM | POA: Diagnosis not present

## 2020-02-24 DIAGNOSIS — J3089 Other allergic rhinitis: Secondary | ICD-10-CM | POA: Diagnosis not present

## 2020-03-02 DIAGNOSIS — J3081 Allergic rhinitis due to animal (cat) (dog) hair and dander: Secondary | ICD-10-CM | POA: Diagnosis not present

## 2020-03-02 DIAGNOSIS — J3089 Other allergic rhinitis: Secondary | ICD-10-CM | POA: Diagnosis not present

## 2020-03-02 DIAGNOSIS — J301 Allergic rhinitis due to pollen: Secondary | ICD-10-CM | POA: Diagnosis not present

## 2020-03-09 DIAGNOSIS — J301 Allergic rhinitis due to pollen: Secondary | ICD-10-CM | POA: Diagnosis not present

## 2020-03-09 DIAGNOSIS — J3081 Allergic rhinitis due to animal (cat) (dog) hair and dander: Secondary | ICD-10-CM | POA: Diagnosis not present

## 2020-03-09 DIAGNOSIS — J3089 Other allergic rhinitis: Secondary | ICD-10-CM | POA: Diagnosis not present

## 2020-03-11 DIAGNOSIS — H2513 Age-related nuclear cataract, bilateral: Secondary | ICD-10-CM | POA: Diagnosis not present

## 2020-03-16 DIAGNOSIS — J301 Allergic rhinitis due to pollen: Secondary | ICD-10-CM | POA: Diagnosis not present

## 2020-03-16 DIAGNOSIS — J3081 Allergic rhinitis due to animal (cat) (dog) hair and dander: Secondary | ICD-10-CM | POA: Diagnosis not present

## 2020-03-16 DIAGNOSIS — J3089 Other allergic rhinitis: Secondary | ICD-10-CM | POA: Diagnosis not present

## 2020-03-17 ENCOUNTER — Other Ambulatory Visit: Payer: Self-pay

## 2020-03-17 DIAGNOSIS — I10 Essential (primary) hypertension: Secondary | ICD-10-CM

## 2020-03-17 MED ORDER — ACEBUTOLOL HCL 200 MG PO CAPS: 200.0000 mg | ORAL_CAPSULE | Freq: Every day | ORAL | 0 refills | Status: DC

## 2020-03-20 ENCOUNTER — Encounter: Payer: Self-pay | Admitting: Family Medicine

## 2020-03-23 DIAGNOSIS — J301 Allergic rhinitis due to pollen: Secondary | ICD-10-CM | POA: Diagnosis not present

## 2020-03-23 DIAGNOSIS — J3081 Allergic rhinitis due to animal (cat) (dog) hair and dander: Secondary | ICD-10-CM | POA: Diagnosis not present

## 2020-03-23 DIAGNOSIS — J3089 Other allergic rhinitis: Secondary | ICD-10-CM | POA: Diagnosis not present

## 2020-03-30 DIAGNOSIS — J3081 Allergic rhinitis due to animal (cat) (dog) hair and dander: Secondary | ICD-10-CM | POA: Diagnosis not present

## 2020-03-30 DIAGNOSIS — J3089 Other allergic rhinitis: Secondary | ICD-10-CM | POA: Diagnosis not present

## 2020-03-30 DIAGNOSIS — J301 Allergic rhinitis due to pollen: Secondary | ICD-10-CM | POA: Diagnosis not present

## 2020-04-06 DIAGNOSIS — J3081 Allergic rhinitis due to animal (cat) (dog) hair and dander: Secondary | ICD-10-CM | POA: Diagnosis not present

## 2020-04-06 DIAGNOSIS — J301 Allergic rhinitis due to pollen: Secondary | ICD-10-CM | POA: Diagnosis not present

## 2020-04-06 DIAGNOSIS — J3089 Other allergic rhinitis: Secondary | ICD-10-CM | POA: Diagnosis not present

## 2020-04-07 DIAGNOSIS — J301 Allergic rhinitis due to pollen: Secondary | ICD-10-CM | POA: Diagnosis not present

## 2020-04-07 DIAGNOSIS — J3089 Other allergic rhinitis: Secondary | ICD-10-CM | POA: Diagnosis not present

## 2020-04-07 DIAGNOSIS — J3081 Allergic rhinitis due to animal (cat) (dog) hair and dander: Secondary | ICD-10-CM | POA: Diagnosis not present

## 2020-04-13 DIAGNOSIS — J301 Allergic rhinitis due to pollen: Secondary | ICD-10-CM | POA: Diagnosis not present

## 2020-04-13 DIAGNOSIS — J3089 Other allergic rhinitis: Secondary | ICD-10-CM | POA: Diagnosis not present

## 2020-04-13 DIAGNOSIS — J3081 Allergic rhinitis due to animal (cat) (dog) hair and dander: Secondary | ICD-10-CM | POA: Diagnosis not present

## 2020-04-20 DIAGNOSIS — J301 Allergic rhinitis due to pollen: Secondary | ICD-10-CM | POA: Diagnosis not present

## 2020-04-20 DIAGNOSIS — J3081 Allergic rhinitis due to animal (cat) (dog) hair and dander: Secondary | ICD-10-CM | POA: Diagnosis not present

## 2020-04-20 DIAGNOSIS — J3089 Other allergic rhinitis: Secondary | ICD-10-CM | POA: Diagnosis not present

## 2020-04-27 ENCOUNTER — Ambulatory Visit: Payer: PPO | Admitting: Family Medicine

## 2020-04-27 DIAGNOSIS — J3081 Allergic rhinitis due to animal (cat) (dog) hair and dander: Secondary | ICD-10-CM | POA: Diagnosis not present

## 2020-04-27 DIAGNOSIS — J301 Allergic rhinitis due to pollen: Secondary | ICD-10-CM | POA: Diagnosis not present

## 2020-04-27 DIAGNOSIS — J3089 Other allergic rhinitis: Secondary | ICD-10-CM | POA: Diagnosis not present

## 2020-05-03 ENCOUNTER — Encounter: Payer: Self-pay | Admitting: Family Medicine

## 2020-05-03 ENCOUNTER — Ambulatory Visit (INDEPENDENT_AMBULATORY_CARE_PROVIDER_SITE_OTHER): Payer: PPO | Admitting: Family Medicine

## 2020-05-03 ENCOUNTER — Other Ambulatory Visit: Payer: Self-pay

## 2020-05-03 VITALS — BP 130/70 | HR 57 | Temp 98.7°F | Ht 60.0 in | Wt 162.0 lb

## 2020-05-03 DIAGNOSIS — I872 Venous insufficiency (chronic) (peripheral): Secondary | ICD-10-CM | POA: Diagnosis not present

## 2020-05-03 DIAGNOSIS — E039 Hypothyroidism, unspecified: Secondary | ICD-10-CM | POA: Diagnosis not present

## 2020-05-03 DIAGNOSIS — M7989 Other specified soft tissue disorders: Secondary | ICD-10-CM

## 2020-05-03 DIAGNOSIS — S81801D Unspecified open wound, right lower leg, subsequent encounter: Secondary | ICD-10-CM | POA: Diagnosis not present

## 2020-05-03 DIAGNOSIS — I10 Essential (primary) hypertension: Secondary | ICD-10-CM

## 2020-05-03 DIAGNOSIS — E782 Mixed hyperlipidemia: Secondary | ICD-10-CM

## 2020-05-03 LAB — COMPREHENSIVE METABOLIC PANEL WITH GFR
ALT: 15 U/L (ref 0–35)
AST: 18 U/L (ref 0–37)
Albumin: 4.3 g/dL (ref 3.5–5.2)
Alkaline Phosphatase: 37 U/L — ABNORMAL LOW (ref 39–117)
BUN: 28 mg/dL — ABNORMAL HIGH (ref 6–23)
CO2: 28 meq/L (ref 19–32)
Calcium: 10.1 mg/dL (ref 8.4–10.5)
Chloride: 105 meq/L (ref 96–112)
Creatinine, Ser: 1.19 mg/dL (ref 0.40–1.20)
GFR: 43.95 mL/min — ABNORMAL LOW
Glucose, Bld: 83 mg/dL (ref 70–99)
Potassium: 3.8 meq/L (ref 3.5–5.1)
Sodium: 141 meq/L (ref 135–145)
Total Bilirubin: 0.7 mg/dL (ref 0.2–1.2)
Total Protein: 6.3 g/dL (ref 6.0–8.3)

## 2020-05-03 LAB — CBC
HCT: 41.2 % (ref 36.0–46.0)
Hemoglobin: 14.1 g/dL (ref 12.0–15.0)
MCHC: 34.1 g/dL (ref 30.0–36.0)
MCV: 87.3 fl (ref 78.0–100.0)
Platelets: 121 10*3/uL — ABNORMAL LOW (ref 150.0–400.0)
RBC: 4.72 Mil/uL (ref 3.87–5.11)
RDW: 12.8 % (ref 11.5–15.5)
WBC: 5.5 10*3/uL (ref 4.0–10.5)

## 2020-05-03 LAB — LIPID PANEL
Cholesterol: 200 mg/dL (ref 0–200)
HDL: 42.1 mg/dL
LDL Cholesterol: 120 mg/dL — ABNORMAL HIGH (ref 0–99)
NonHDL: 158.09
Total CHOL/HDL Ratio: 5
Triglycerides: 191 mg/dL — ABNORMAL HIGH (ref 0.0–149.0)
VLDL: 38.2 mg/dL (ref 0.0–40.0)

## 2020-05-03 LAB — TSH: TSH: 2.47 u[IU]/mL (ref 0.35–4.50)

## 2020-05-03 MED ORDER — ACEBUTOLOL HCL 200 MG PO CAPS: 200.0000 mg | ORAL_CAPSULE | Freq: Every day | ORAL | 2 refills | Status: DC

## 2020-05-03 MED ORDER — LEVOXYL 75 MCG PO TABS: ORAL_TABLET | ORAL | 2 refills | Status: DC

## 2020-05-03 NOTE — Assessment & Plan Note (Signed)
Check lipid panel  

## 2020-05-03 NOTE — Addendum Note (Signed)
Addended by: Caryl Bis Tiara Bartoli G on: 05/03/2020 05:07 PM   Modules accepted: Orders

## 2020-05-03 NOTE — Assessment & Plan Note (Signed)
Adequately controlled.  Encouraged her to check her blood pressure periodically.  Continue acebutolol.

## 2020-05-03 NOTE — Assessment & Plan Note (Addendum)
Leg wound is progressively healing though at a slow rate.  Encouraged her to stop using the antibiotic ointment to see if that would allow the small area to heal.  Discussed that her skin would likely not return to normal after the prior injury.  She will monitor for signs of infection.

## 2020-05-03 NOTE — Patient Instructions (Addendum)
Nice to see you. We will get lab work today and contact you with the results. Please try to stop using the antibiotic ointment to see if that will allow things to heal.  If not healing or if you have any worsening issues please let us know. Please try to elevate your legs as much as possible.
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
## 2020-05-03 NOTE — Assessment & Plan Note (Signed)
Continue Levoxyl.  Check TSH.

## 2020-05-03 NOTE — Assessment & Plan Note (Signed)
I suspect this is the cause of her swelling.  Will obtain lab work as outlined below to rule out underlying causes.  She will try to elevate her legs as much as possible.  Discussed use of compression stockings which she already has at home though advised to wait until the area on her right leg completely heals.

## 2020-05-04 DIAGNOSIS — J3089 Other allergic rhinitis: Secondary | ICD-10-CM | POA: Diagnosis not present

## 2020-05-04 DIAGNOSIS — J301 Allergic rhinitis due to pollen: Secondary | ICD-10-CM | POA: Diagnosis not present

## 2020-05-04 DIAGNOSIS — J3081 Allergic rhinitis due to animal (cat) (dog) hair and dander: Secondary | ICD-10-CM | POA: Diagnosis not present

## 2020-05-11 ENCOUNTER — Other Ambulatory Visit: Payer: Self-pay | Admitting: *Deleted

## 2020-05-11 DIAGNOSIS — J301 Allergic rhinitis due to pollen: Secondary | ICD-10-CM | POA: Diagnosis not present

## 2020-05-11 DIAGNOSIS — J3089 Other allergic rhinitis: Secondary | ICD-10-CM | POA: Diagnosis not present

## 2020-05-11 DIAGNOSIS — M810 Age-related osteoporosis without current pathological fracture: Secondary | ICD-10-CM

## 2020-05-11 DIAGNOSIS — J3081 Allergic rhinitis due to animal (cat) (dog) hair and dander: Secondary | ICD-10-CM | POA: Diagnosis not present

## 2020-05-11 MED ORDER — ALENDRONATE SODIUM 35 MG PO TABS: 70.0000 mg | ORAL_TABLET | ORAL | 0 refills | Status: DC

## 2020-05-15 ENCOUNTER — Telehealth: Payer: Self-pay

## 2020-05-15 NOTE — Telephone Encounter (Signed)
-----   Message from Leone Haven, MD sent at 05/06/2020  9:01 AM EDT ----- Please let the patient know that her kidney function is relatively stable.  It looks like she may be slightly dehydrated based on her mildly elevated BUN.  Her platelets are low though are relatively stable.  We will continue to monitor those as they have been stable over the last 3 years.  Her LDL is mildly elevated.  She has been intolerant of statins in the past and thus we will continue just on Zetia given her age.  Her other labs are acceptable.  Thanks.  The 10-year ASCVD risk score Mikey Bussing DC Brooke Bonito., et al., 2013) is: 25.8%   Values used to calculate the score:     Age: 79 years     Sex: Female     Is Non-Hispanic African American: No     Diabetic: No     Tobacco smoker: No     Systolic Blood Pressure: 700 mmHg     Is BP treated: Yes     HDL Cholesterol: 42.1 mg/dL     Total Cholesterol: 200 mg/dL

## 2020-05-18 ENCOUNTER — Ambulatory Visit (INDEPENDENT_AMBULATORY_CARE_PROVIDER_SITE_OTHER): Payer: PPO

## 2020-05-18 VITALS — Ht 60.0 in | Wt 162.0 lb

## 2020-05-18 DIAGNOSIS — J3089 Other allergic rhinitis: Secondary | ICD-10-CM | POA: Diagnosis not present

## 2020-05-18 DIAGNOSIS — Z0000 Encounter for general adult medical examination without abnormal findings: Secondary | ICD-10-CM | POA: Diagnosis not present

## 2020-05-18 DIAGNOSIS — J301 Allergic rhinitis due to pollen: Secondary | ICD-10-CM | POA: Diagnosis not present

## 2020-05-18 DIAGNOSIS — J3081 Allergic rhinitis due to animal (cat) (dog) hair and dander: Secondary | ICD-10-CM | POA: Diagnosis not present

## 2020-05-18 NOTE — Patient Instructions (Addendum)
[name] , Thank you for taking time to come for your Medicare Wellness Visit. I appreciate your ongoing commitment to your health goals. Please review the following plan we discussed and let me know if I can assist you in the future.   These are the goals we discussed: Goals      Patient Stated   .  Increase physical activity (pt-stated)      Jazzercise stretches       This is a list of the screening recommended for you and due dates:  Health Maintenance  Topic Date Due  .  Hepatitis C: One time screening is recommended by Center for Disease Control  (CDC) for  adults born from [birth year] through 1963.   Never done  . Flu Shot  11/18/2020*  . Tetanus Vaccine  08/21/2020  . Colon Cancer Screening  03/21/2023  . DEXA scan (bone density measurement)  Completed  . COVID-19 Vaccine  Completed  . Pneumonia vaccines  Completed  *Topic was postponed. The date shown is not the original due date.    Immunizations Immunization History  Administered Date(s) Administered  . Fluad Quad(high Dose 65+) 05/13/2019  . Influenza Split 05/01/2012  . Influenza, High Dose Seasonal PF 05/22/2016, 05/11/2017, 05/22/2018  . Influenza,inj,Quad PF,6+ Mos 05/18/2014  . Influenza-Unspecified 05/23/2013  . PFIZER SARS-COV-2 Vaccination 10/02/2019, 10/28/2019  . Pneumococcal Conjugate-13 08/21/2010  . Pneumococcal Polysaccharide-23 04/10/2014  . Tdap 08/21/2010  . Zoster 07/05/2008  . Zoster Recombinat (Shingrix) 04/04/2019   Keep all routine maintenance appointments.   Nurse visit 05/24/20 @ 3:30 High dose influenza vaccine.   Follow up 11/01/20  Advanced directives: not yet completed.   Conditions/risks identified: none new.  Follow up in one year for your annual wellness visit.   Preventive Care 28 Years and Older, Female Preventive care refers to lifestyle choices and visits with your health care provider that can promote health and wellness. What does preventive care include?  A yearly  physical exam. This is also called an annual well check.  Dental exams once or twice a year.  Routine eye exams. Ask your health care provider how often you should have your eyes checked.  Personal lifestyle choices, including:  Daily care of your teeth and gums.  Regular physical activity.  Eating a healthy diet.  Avoiding tobacco and drug use.  Limiting alcohol use.  Practicing safe sex.  Taking low-dose aspirin every day.  Taking vitamin and mineral supplements as recommended by your health care provider. What happens during an annual well check? The services and screenings done by your health care provider during your annual well check will depend on your age, overall health, lifestyle risk factors, and family history of disease. Counseling  Your health care provider may ask you questions about your:  Alcohol use.  Tobacco use.  Drug use.  Emotional well-being.  Home and relationship well-being.  Sexual activity.  Eating habits.  History of falls.  Memory and ability to understand (cognition).  Work and work Statistician.  Reproductive health. Screening  You may have the following tests or measurements:  Height, weight, and BMI.  Blood pressure.  Lipid and cholesterol levels. These may be checked every 5 years, or more frequently if you are over 63 years old.  Skin check.  Lung cancer screening. You may have this screening every year starting at age 18 if you have a 30-pack-year history of smoking and currently smoke or have quit within the past 15 years.  Fecal occult blood  test (FOBT) of the stool. You may have this test every year starting at age 72.  Flexible sigmoidoscopy or colonoscopy. You may have a sigmoidoscopy every 5 years or a colonoscopy every 10 years starting at age 56.  Hepatitis C blood test.  Hepatitis B blood test.  Sexually transmitted disease (STD) testing.  Diabetes screening. This is done by checking your blood sugar  (glucose) after you have not eaten for a while (fasting). You may have this done every 1-3 years.  Bone density scan. This is done to screen for osteoporosis. You may have this done starting at age 46.  Mammogram. This may be done every 1-2 years. Talk to your health care provider about how often you should have regular mammograms. Talk with your health care provider about your test results, treatment options, and if necessary, the need for more tests. Vaccines  Your health care provider may recommend certain vaccines, such as:  Influenza vaccine. This is recommended every year.  Tetanus, diphtheria, and acellular pertussis (Tdap, Td) vaccine. You may need a Td booster every 10 years.  Zoster vaccine. You may need this after age 48.  Pneumococcal 13-valent conjugate (PCV13) vaccine. One dose is recommended after age 67.  Pneumococcal polysaccharide (PPSV23) vaccine. One dose is recommended after age 71. Talk to your health care provider about which screenings and vaccines you need and how often you need them. This information is not intended to replace advice given to you by your health care provider. Make sure you discuss any questions you have with your health care provider. Document Released: 09/03/2015 Document Revised: 04/26/2016 Document Reviewed: 06/08/2015 Elsevier Interactive Patient Education  2017 [place] Prevention in the Home Falls can cause injuries. They can happen to people of all ages. There are many things you can do to make your home safe and to help prevent falls. What can I do on the outside of my home?  Regularly fix the edges of walkways and driveways and fix any cracks.  Remove anything that might make you trip as you walk through a door, such as a raised step or threshold.  Trim any bushes or trees on the path to your home.  Use bright outdoor lighting.  Clear any walking paths of anything that might make someone trip, such as rocks or  tools.  Regularly check to see if handrails are loose or broken. Make sure that both sides of any steps have handrails.  Any raised decks and porches should have guardrails on the edges.  Have any leaves, snow, or ice cleared regularly.  Use sand or salt on walking paths during winter.  Clean up any spills in your garage right away. This includes oil or grease spills. What can I do in the bathroom?  Use night lights.  Install grab bars by the toilet and in the tub and shower. Do not use towel bars as grab bars.  Use non-skid mats or decals in the tub or shower.  If you need to sit down in the shower, use a plastic, non-slip stool.  Keep the floor dry. Clean up any water that spills on the floor as soon as it happens.  Remove soap buildup in the tub or shower regularly.  Attach bath mats securely with double-sided non-slip rug tape.  Do not have throw rugs and other things on the floor that can make you trip. What can I do in the bedroom?  Use night lights.  Make sure that you have a  light by your bed that is easy to reach.  Do not use any sheets or blankets that are too big for your bed. They should not hang down onto the floor.  Have a firm chair that has side arms. You can use this for support while you get dressed.  Do not have throw rugs and other things on the floor that can make you trip. What can I do in the kitchen?  Clean up any spills right away.  Avoid walking on wet floors.  Keep items that you use a lot in easy-to-reach places.  If you need to reach something above you, use a strong step stool that has a grab bar.  Keep electrical cords out of the way.  Do not use floor polish or wax that makes floors slippery. If you must use wax, use non-skid floor wax.  Do not have throw rugs and other things on the floor that can make you trip. What can I do with my stairs?  Do not leave any items on the stairs.  Make sure that there are handrails on both  sides of the stairs and use them. Fix handrails that are broken or loose. Make sure that handrails are as long as the stairways.  Check any carpeting to make sure that it is firmly attached to the stairs. Fix any carpet that is loose or worn.  Avoid having throw rugs at the top or bottom of the stairs. If you do have throw rugs, attach them to the floor with carpet tape.  Make sure that you have a light switch at the top of the stairs and the bottom of the stairs. If you do not have them, ask someone to add them for you. What else can I do to help prevent falls?  Wear shoes that:  Do not have high heels.  Have rubber bottoms.  Are comfortable and fit you well.  Are closed at the toe. Do not wear sandals.  If you use a stepladder:  Make sure that it is fully opened. Do not climb a closed stepladder.  Make sure that both sides of the stepladder are locked into place.  Ask someone to hold it for you, if possible.  Clearly mark and make sure that you can see:  Any grab bars or handrails.  First and last steps.  Where the edge of each step is.  Use tools that help you move around (mobility aids) if they are needed. These include:  Canes.  Walkers.  Scooters.  Crutches.  Turn on the lights when you go into a dark area. Replace any light bulbs as soon as they burn out.  Set up your furniture so you have a clear path. Avoid moving your furniture around.  If any of your floors are uneven, fix them.  If there are any pets around you, be aware of where they are.  Review your medicines with your doctor. Some medicines can make you feel dizzy. This can increase your chance of falling. Ask your doctor what other things that you can do to help prevent falls. This information is not intended to replace advice given to you by your health care provider. Make sure you discuss any questions you have with your health care provider. Document Released: 06/03/2009 Document Revised:  01/13/2016 Document Reviewed: 09/11/2014 Elsevier Interactive Patient Education  2017 [employer].

## 2020-05-18 NOTE — Progress Notes (Signed)
Subjective:   Gwendolyn King is a 79 y.o. female who presents for Medicare Annual (Subsequent) preventive examination.  Review of Systems    No ROS.  Medicare Wellness Virtual Visit.   Cardiac Risk Factors include: advanced age (>91men, >44 women)     Objective:    Today's Vitals   05/18/20 1132  Weight: 162 lb (73.5 kg)  Height: 5' (1.524 m)   Body mass index is 31.64 kg/m.  Advanced Directives 05/18/2020 12/12/2019 10/16/2019 09/11/2019 09/08/2019 06/13/2019 06/12/2019  Does Patient Have a Medical Advance Directive? No No No No No No No  Would patient like information on creating a medical advance directive? - No - Patient declined No - Patient declined No - Patient declined - No - Patient declined -    Current Medications (verified) Outpatient Encounter Medications as of 05/18/2020  Medication Sig  . acebutolol (SECTRAL) 200 MG capsule Take 1 capsule (200 mg total) by mouth daily.  Marland Kitchen acetaminophen (TYLENOL) 500 MG tablet Take 1,000 mg by mouth at bedtime.  Marland Kitchen alendronate (FOSAMAX) 35 MG tablet Take 2 tablets (70 mg total) by mouth every 7 (seven) days. Take with a full glass of water on an empty stomach.  Marland Kitchen aspirin EC 81 MG tablet Take 81 mg by mouth at bedtime.   . AZELASTINE HCL OP Place 1 drop into both eyes 3 (three) times daily as needed (dry/irritated eyes.).  Marland Kitchen Biotin 5 MG TABS Take 5 mg by mouth daily.  . calcium-vitamin D (OSCAL WITH D) 500-200 MG-UNIT tablet Take 1 tablet by mouth daily.   . cholecalciferol (VITAMIN D) 25 MCG (1000 UNIT) tablet Take 1,000 Units by mouth every evening.  Marland Kitchen EPINEPHrine 0.3 mg/0.3 mL IJ SOAJ injection Inject 0.3 mg into the muscle as needed for anaphylaxis.  Marland Kitchen ezetimibe (ZETIA) 10 MG tablet Take 1 tablet (10 mg total) by mouth daily. (Patient not taking: Reported on 05/03/2020)  . fluticasone (FLONASE) 50 MCG/ACT nasal spray Place 1 spray into both nostrils daily as needed (allergies.).   Marland Kitchen Krill Oil 500 MG CAPS Take 500 mg by mouth daily.    Marland Kitchen letrozole (FEMARA) 2.5 MG tablet Take 1 tablet (2.5 mg total) by mouth daily.  Marland Kitchen levocetirizine (XYZAL) 5 MG tablet Take 5 mg by mouth every evening.   Marland Kitchen LEVOXYL 75 MCG tablet Take 1 tablet by mouth once daily  . Multiple Vitamin (MULTIVITAMIN WITH MINERALS) TABS tablet Take 1 tablet by mouth daily. Adult 50+  . polyethylene glycol powder (GLYCOLAX/MIRALAX) powder Take 17 g by mouth daily. With coffee  . vitamin B-12 (CYANOCOBALAMIN) 1000 MCG tablet Take 1,000 mcg by mouth daily.   No facility-administered encounter medications on file as of 05/18/2020.    Allergies (verified) Singulair [montelukast], Lipitor [atorvastatin], and Simvastatin   History: Past Medical History:  Diagnosis Date  . Allergy    hay fever, fall allergies  . Arthritis   . Asthma    as a child, dad was smoker  . 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  . Cataract    Bil  . Chicken pox   . Chronic kidney disease    stage 3  . Colon polyp   . Constipation   . Hyperlipidemia   . Hypothyroidism   . Palpitations    over 20 yeras ago  . Personal history of radiation therapy   . Thyroid disease    Past Surgical History:  Procedure Laterality Date  . ABDOMINAL HYSTERECTOMY  1981  . APPENDECTOMY  2012  . 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 BREAST BIOPSY Right 05/17/2017   [place] bx done at DR. Brynetts office, papillary carcinoma  . BREAST BIOPSY Right 06/18/2019   Stereo bc calcs path pending  . BREAST LUMPECTOMY Right 06/07/2017   DUCTAL CARCINOMA IN SITU (DCIS) with microinvasion at 7:00 5 cmfn  . BREAST LUMPECTOMY Right 06/07/2017   PAPILLARY LESION CONSISTENT WITH PAPILLARY CARCINOMA at retroaerolar 7:00 1.5 cmfn  . BREAST LUMPECTOMY WITH SENTINEL LYMPH NODE BIOPSY Right 06/07/2017   Wide excision of 2 foci of high-grade DCIS,  microinvasion noted only on core biopsy. 2 microscopic foci lateral inferior margins. Patient elected not to proceed to reexcision.  . COLONOSCOPY    . COLONOSCOPY W/ POLYPECTOMY    . CYSTOCELE REPAIR N/A 09/11/2019   Procedure: ANTERIOR COLPORRHAPHY;  Surgeon: Gae Dry, MD;  Location: [facility];  Service: Gynecology;  Laterality: N/A;  . FINGER FRACTURE SURGERY Right    5th  . HAMMER TOE SURGERY  09/20/2017  . JOINT REPLACEMENT     right knee  . ROTATOR CUFF REPAIR Right   . TONSILLECTOMY AND ADENOIDECTOMY  1963  . TOTAL KNEE ARTHROPLASTY Right 10/17/2016   Procedure: TOTAL KNEE ARTHROPLASTY;  Surgeon: Melrose Nakayama, MD;  Location: [employer];  Service: Orthopedics;  Laterality: Right;  Marland Kitchen VAGINAL DELIVERY     3   Family History  Problem Relation Age of Onset  . Arthritis Mother   . Heart disease Mother   . Hypertension Mother   . Arthritis Father   . Heart disease Father   . Hypertension Father   . Heart attack Father   . Stroke Maternal Grandmother   . Cancer Maternal Aunt        abdominal?  . Hyperlipidemia Maternal Aunt   . Diabetes Paternal Aunt   . Glaucoma Brother   . Hyperlipidemia Other   . Diabetes Other   . Breast cancer Neg Hx    Social History   Socioeconomic History  . Marital status: Married    Spouse name: Not on file  . Number of children: Not on file  . Years of education: Not on file  . Highest education level: Not on file  Occupational History  . Not on file  Tobacco Use  . Smoking status: Never Smoker  . Smokeless tobacco: Never Used  Vaping Use  . Vaping Use: Never used  Substance and Sexual Activity  . Alcohol use: Yes    Comment: wine occ  . Drug use: No  . Sexual activity: Not Currently    Birth control/protection: Post-menopausal  Other Topics Concern  . Not on file  Social History Narrative   Lives with husband in [place]. 3 children.      Work - retired, Pharmacist, community, Pharmacist, hospital      Diet - regular, limits sugar, eats  small meals   Exercise - Jazzercise 4 days per week   Social Determinants of Radio broadcast assistant Strain: Low Risk   . Difficulty of Paying Living Expenses: Not hard at all  Food Insecurity: No Food Insecurity  . Worried About Charity fundraiser in the Last Year: Never true  . Ran Out of Food in the Last Year: Never true  Transportation Needs: No Transportation Needs  . Lack of Transportation (Medical): No  . Lack  of Transportation (Non-Medical): No  Physical Activity:   . Days of Exercise per Week: Not on file  . Minutes of Exercise per Session: Not on file  Stress: No Stress Concern Present  . Feeling of Stress : Not at all  Social Connections: Socially Integrated  . Frequency of Communication with Friends and Family: More than three times a week  . Frequency of Social Gatherings with Friends and Family: Not on file  . Attends Religious Services: More than 4 times per year  . Active Member of Clubs or Organizations: Yes  . Attends Archivist Meetings: Not on file  . Marital Status: Married    Tobacco Counseling Counseling given: Not Answered   Clinical Intake:  Pre-visit preparation completed: Yes        Diabetes: No  How often do you need to have someone help you when you read instructions, pamphlets, or other written materials from your doctor or pharmacy?: 1 - Never   Interpreter Needed?: No      Activities of Daily Living In your present state of health, do you have any difficulty performing the following activities: 05/18/2020 09/08/2019  Hearing? Y N  Vision? N N  Difficulty concentrating or making decisions? N N  Walking or climbing stairs? N N  Dressing or bathing? N N  Doing errands, shopping? N N  Preparing Food and eating ? N -  Using the Toilet? N -  In the past six months, have you accidently leaked urine? N -  Do you have problems with loss of bowel control? N -  Managing your Medications? N -  Managing your Finances? N -    Housekeeping or managing your Housekeeping? N -  Some recent data might be hidden    Patient Care Team: Leone Haven, MD as PCP - General (Family Medicine) Rockey Situ Kathlene November, MD as Consulting Physician (Cardiology) Bary Castilla Forest Gleason, MD (General Surgery)  Indicate any recent Medical Services you may have received from other than [facility] providers in the past year (date may be approximate).     Assessment:   This is a routine wellness examination for Nataya.  I connected with Natia today by telephone and verified that I am speaking with the correct person using two identifiers. Location patient: home Location provider: work Persons participating in the virtual visit: patient, Marine scientist.    I discussed the limitations, risks, security and privacy concerns of performing an evaluation and management service by telephone and the availability of in person appointments. The patient expressed understanding and verbally consented to this telephonic visit.    Interactive audio and video telecommunications were attempted between this provider and patient, however failed, due to patient having technical difficulties OR patient did not have access to video capability.  We continued and completed visit with audio only.  Some vital signs may be absent or patient reported.   Hearing/Vision screen  Hearing Screening   '125Hz'$  $Remo'250Hz'kgUMS$'500Hz'$'1000Hz'$'2000Hz'$'3000Hz'$'4000Hz'$'6000Hz'$'8000Hz'$   Right ear:           Left ear:           Comments: Hearing aid, bilateral   Vision Screening Comments: Wears corrective lenses Visual acuity not assessed, virtual visit.  They have seen their ophthalmologist in the last 12 months.    Dietary issues and exercise activities discussed: Current Exercise Habits: Home exercise routine Healthy diet Good water intake Goals      Patient Stated   .  Increase physical activity (  pt-stated)      Jazzercise stretches      Depression Screen PHQ 2/9 Scores 05/18/2020  05/03/2020 01/27/2020 01/27/2020 09/08/2019 05/16/2019 04/21/2019  PHQ - 2 Score 0 0 0 0 0 0 0    Fall Risk Fall Risk  05/18/2020 05/03/2020 01/27/2020 09/08/2019 05/16/2019  Falls in the past year? 0 0 0 0 0  Number falls in past yr: 0 0 0 0 -  Injury with Fall? - - - - -  Follow up Falls evaluation completed Falls evaluation completed Falls evaluation completed Falls evaluation completed -   Handrails in use when climbing stairs? Yes Home free of loose throw rugs in walkways, pet beds, electrical cords, etc? Yes  Adequate lighting in your home to reduce risk of falls? Yes   ASSISTIVE DEVICES UTILIZED TO PREVENT FALLS: Use of a cane, walker or w/c? No  Grab bars in the bathroom? Yes  Shower chair or bench in shower? No   Elevated toilet seat or a handicapped toilet? Yes   TIMED UP AND GO: Was the test performed? No . Virtual visit.   Cognitive Function: Patient is alert and oriented x3.  Denies difficulty making decisions, memory loss, focusing.  Enjoys brain challenging activities for mind stimulation.   MMSE - Mini Mental State Exam 04/18/2018  Orientation to time 5  Orientation to Place 5  Registration 3  Attention/ Calculation 5  Recall 3  Language- name 2 objects 2  Language- repeat 1  Language- follow 3 step command 3  Language- read & follow direction 1  Write a sentence 1  Copy design 1  Total score 30     6CIT Screen 05/16/2019  What Year? 0 points  What month? 0 points  What time? 0 points  Count back from 20 0 points  Months in reverse 0 points  Repeat phrase 0 points  Total Score 0    Immunizations Immunization History  Administered Date(s) Administered  . Fluad Quad(high Dose 65+) 05/13/2019  . Influenza Split 05/01/2012  . Influenza, High Dose Seasonal PF 05/22/2016, 05/11/2017, 05/22/2018  . Influenza,inj,Quad PF,6+ Mos 05/18/2014  . Influenza-Unspecified 05/23/2013  . PFIZER SARS-COV-2 Vaccination 10/02/2019, 10/28/2019  . Pneumococcal Conjugate-13  08/21/2010  . Pneumococcal Polysaccharide-23 04/10/2014  . Tdap 08/21/2010  . Zoster 07/05/2008  . Zoster Recombinat (Shingrix) 04/04/2019   Health Maintenance Health Maintenance  Topic Date Due  . Hepatitis C Screening  Never done  . INFLUENZA VACCINE  11/18/2020 (Originally 03/21/2020)  . TETANUS/TDAP  08/21/2020  . COLONOSCOPY  03/21/2023  . DEXA SCAN  Completed  . COVID-19 Vaccine  Completed  . PNA vac Low Risk Adult  Completed   Dental Screening: Recommended annual dental exams for proper oral hygiene.  Community Resource Referral / Chronic Care Management: CRR required this visit?  No   CCM required this visit?  No      Plan:   Keep all routine maintenance appointments.   Nurse visit 05/24/20 @ 3:30 High dose influenza vaccine.   Follow up 11/01/20  I have personally reviewed and noted the following in the patient's chart:   . Medical and social history . Use of alcohol, tobacco or illicit drugs  . Current medications and supplements . Functional ability and status . Nutritional status . Physical activity . Advanced directives . List of other physicians . Hospitalizations, surgeries, and ER visits in previous 12 months . Vitals . Screenings to include cognitive, depression, and falls . Referrals and appointments  In addition, I have  reviewed and discussed with patient certain preventive protocols, quality metrics, and best practice recommendations. A written personalized care plan for preventive services as well as general preventive health recommendations were provided to patient via mychart.     Varney Biles, LPN   9/31/1216

## 2020-05-24 ENCOUNTER — Ambulatory Visit: Payer: PPO

## 2020-05-28 DIAGNOSIS — Z20822 Contact with and (suspected) exposure to covid-19: Secondary | ICD-10-CM | POA: Diagnosis not present

## 2020-05-29 ENCOUNTER — Encounter: Payer: Self-pay | Admitting: Family Medicine

## 2020-06-07 ENCOUNTER — Ambulatory Visit: Payer: PPO

## 2020-06-08 DIAGNOSIS — J301 Allergic rhinitis due to pollen: Secondary | ICD-10-CM | POA: Diagnosis not present

## 2020-06-08 DIAGNOSIS — J3089 Other allergic rhinitis: Secondary | ICD-10-CM | POA: Diagnosis not present

## 2020-06-08 DIAGNOSIS — J3081 Allergic rhinitis due to animal (cat) (dog) hair and dander: Secondary | ICD-10-CM | POA: Diagnosis not present

## 2020-06-10 ENCOUNTER — Ambulatory Visit
Admission: RE | Admit: 2020-06-10 | Discharge: 2020-06-10 | Disposition: A | Discharge status: Home / Self Care | Payer: PPO | Source: Ambulatory Visit | Attending: Oncology | Admitting: Oncology

## 2020-06-10 ENCOUNTER — Other Ambulatory Visit: Payer: Self-pay

## 2020-06-10 DIAGNOSIS — D0511 Intraductal carcinoma in situ of right breast: Secondary | ICD-10-CM

## 2020-06-10 DIAGNOSIS — Z1231 Encounter for screening mammogram for malignant neoplasm of breast: Secondary | ICD-10-CM | POA: Diagnosis present

## 2020-06-10 DIAGNOSIS — Z780 Asymptomatic menopausal state: Secondary | ICD-10-CM | POA: Insufficient documentation

## 2020-06-10 DIAGNOSIS — E039 Hypothyroidism, unspecified: Secondary | ICD-10-CM | POA: Diagnosis not present

## 2020-06-10 DIAGNOSIS — R928 Other abnormal and inconclusive findings on diagnostic imaging of breast: Secondary | ICD-10-CM | POA: Diagnosis not present

## 2020-06-10 DIAGNOSIS — M8580 Other specified disorders of bone density and structure, unspecified site: Secondary | ICD-10-CM | POA: Diagnosis not present

## 2020-06-10 DIAGNOSIS — Z853 Personal history of malignant neoplasm of breast: Secondary | ICD-10-CM | POA: Diagnosis not present

## 2020-06-10 DIAGNOSIS — Z13820 Encounter for screening for osteoporosis: Secondary | ICD-10-CM | POA: Insufficient documentation

## 2020-06-10 DIAGNOSIS — M810 Age-related osteoporosis without current pathological fracture: Secondary | ICD-10-CM | POA: Diagnosis not present

## 2020-06-10 NOTE — Progress Notes (Signed)
[place]  [phone] [phone]) [phone]  ID: Gwendolyn King OB: Aug 16, 1941  MR#: [id number]  VAN#:[id number]  Patient Care Team: Leone Haven, MD as PCP - General (Family Medicine) Minna Merritts, MD as Consulting Physician (Cardiology) Bary Castilla Forest Gleason, MD (General Surgery)  I connected with Gwendolyn King on 06/15/20 at  2:30 PM EDT by video enabled telemedicine visit and verified that I am speaking with the correct person using two identifiers.   I discussed the limitations, risks, security and privacy concerns of performing an evaluation and management service by telemedicine and the availability of in-person appointments. I also discussed with the patient that there may be a patient responsible charge related to this service. The patient expressed understanding and agreed to proceed.   Other persons participating in the visit and their role in the encounter: Patient, MD.  Patient's location: Home. Provider's location: Clinic.  CHIEF COMPLAINT: DCIS, right breast.  INTERVAL HISTORY: Patient agreed to video assisted telemedicine visit for further evaluation and discussion of her bone mineral density and mammogram results.  She continues to feel well and remains asymptomatic.  She is tolerating letrozole and Fosamax without significant side effects.  She has no neurologic complaints.  She denies any recent fevers or illnesses.  She has a good appetite and denies weight loss. She denies any chest pain, shortness of breath, cough, or hemoptysis.  She denies any nausea, vomiting, constipation, or diarrhea.  She has no urinary complaints.  Patient offers no specific complaints today.  REVIEW OF SYSTEMS:   Review of Systems  Constitutional: Negative.  Negative for diaphoresis, fever, malaise/fatigue and weight loss.  Respiratory: Negative.  Negative for cough and shortness of breath.   Cardiovascular: Negative.  Negative for chest pain and leg  swelling.  Gastrointestinal: Negative.  Negative for abdominal pain and constipation.  Genitourinary: Negative.  Negative for dysuria.  Musculoskeletal: Negative.  Negative for back pain.  Skin: Negative.  Negative for itching and rash.  Neurological: Negative.  Negative for sensory change, focal weakness and weakness.  Psychiatric/Behavioral: Negative.  The patient is not nervous/anxious.     As per HPI. Otherwise, a complete review of systems is negative.  PAST MEDICAL HISTORY: Past Medical History:  Diagnosis Date  . Allergy    hay fever, fall allergies  . Arthritis   . Asthma    as a child, dad was smoker  . 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  . Cataract    Bil  . Chicken pox   . Chronic kidney disease    stage 3  . Colon polyp   . Constipation   . Hyperlipidemia   . Hypothyroidism   . Palpitations    over 20 yeras ago  . Personal history of radiation therapy   . Thyroid disease     PAST SURGICAL HISTORY: Past Surgical History:  Procedure Laterality Date  . ABDOMINAL HYSTERECTOMY  1981  . APPENDECTOMY  2012  . 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 BREAST BIOPSY Right 05/17/2017   [place] bx done at DR. Brynetts office, papillary carcinoma  . BREAST BIOPSY Right 06/18/2019   Stereo bc calcs COMPATIBLE WITH PRIOR SURGICAL SITE CHANGES  . BREAST LUMPECTOMY Right 06/07/2017   DUCTAL CARCINOMA IN SITU (DCIS) with microinvasion at 7:00 5 cmfn  . BREAST LUMPECTOMY Right 06/07/2017  PAPILLARY LESION CONSISTENT WITH PAPILLARY CARCINOMA at retroaerolar 7:00 1.5 cmfn  . BREAST LUMPECTOMY WITH SENTINEL LYMPH NODE BIOPSY Right 06/07/2017   Wide excision of 2 foci of high-grade DCIS, microinvasion noted only on core biopsy. 2 microscopic foci lateral inferior margins. Patient elected not to proceed to  reexcision.  . COLONOSCOPY    . COLONOSCOPY W/ POLYPECTOMY    . CYSTOCELE REPAIR N/A 09/11/2019   Procedure: ANTERIOR COLPORRHAPHY;  Surgeon: Nadara Mustard, MD;  Location: [facility] ORS;  Service: Gynecology;  Laterality: N/A;  . FINGER FRACTURE SURGERY Right    5th  . HAMMER TOE SURGERY  09/20/2017  . JOINT REPLACEMENT     right knee  . ROTATOR CUFF REPAIR Right   . TONSILLECTOMY AND ADENOIDECTOMY  1963  . TOTAL KNEE ARTHROPLASTY Right 10/17/2016   Procedure: TOTAL KNEE ARTHROPLASTY;  Surgeon: Marcene Corning, MD;  Location: [facility] OR;  Service: Orthopedics;  Laterality: Right;  Marland Kitchen VAGINAL DELIVERY     3    FAMILY HISTORY: Family History  Problem Relation Age of Onset  . Arthritis Mother   . Heart disease Mother   . Hypertension Mother   . Arthritis Father   . Heart disease Father   . Hypertension Father   . Heart attack Father   . Stroke Maternal Grandmother   . Cancer Maternal Aunt        abdominal?  . Hyperlipidemia Maternal Aunt   . Diabetes Paternal Aunt   . Glaucoma Brother   . Hyperlipidemia Other   . Diabetes Other   . Breast cancer Neg Hx     ADVANCED DIRECTIVES (Y/N):  N  HEALTH MAINTENANCE: Social History   Tobacco Use  . Smoking status: Never Smoker  . Smokeless tobacco: Never Used  Vaping Use  . Vaping Use: Never used  Substance Use Topics  . Alcohol use: Yes    Comment: wine occ  . Drug use: No     Colonoscopy:  PAP:  Bone density:  Lipid panel:  Allergies  Allergen Reactions  . Singulair [Montelukast] Itching  . Lipitor [Atorvastatin] Other (See Comments)    Myalgia, arthralgia  . Simvastatin Other (See Comments)    myalgia    Current Outpatient Medications  Medication Sig Dispense Refill  . acebutolol (SECTRAL) 200 MG capsule Take 1 capsule (200 mg total) by mouth daily. 90 capsule 2  . acetaminophen (TYLENOL) 500 MG tablet Take 1,000 mg by mouth at bedtime.    Marland Kitchen aspirin EC 81 MG tablet Take 81 mg by mouth at bedtime.     . AZELASTINE  HCL OP Place 1 drop into both eyes 3 (three) times daily as needed (dry/irritated eyes.).    Marland Kitchen Biotin 5 MG TABS Take 5 mg by mouth daily.    . calcium-vitamin D (OSCAL WITH D) 500-200 MG-UNIT tablet Take 1 tablet by mouth daily.     . cholecalciferol (VITAMIN D) 25 MCG (1000 UNIT) tablet Take 1,000 Units by mouth every evening.    Marland Kitchen EPINEPHrine 0.3 mg/0.3 mL IJ SOAJ injection Inject 0.3 mg into the muscle as needed for anaphylaxis.    . fluticasone (FLONASE) 50 MCG/ACT nasal spray Place 1 spray into both nostrils daily as needed (allergies.).   3  . Krill Oil 500 MG CAPS Take 500 mg by mouth daily.    Marland Kitchen letrozole (FEMARA) 2.5 MG tablet Take 1 tablet (2.5 mg total) by mouth daily. 90 tablet 3  . levocetirizine (XYZAL) 5 MG tablet Take 5 mg by  mouth every evening.   3  . LEVOXYL 75 MCG tablet Take 1 tablet by mouth once daily 90 tablet 2  . Multiple Vitamin (MULTIVITAMIN WITH MINERALS) TABS tablet Take 1 tablet by mouth daily. Adult 50+    . polyethylene glycol powder (GLYCOLAX/MIRALAX) powder Take 17 g by mouth daily. With coffee    . vitamin B-12 (CYANOCOBALAMIN) 1000 MCG tablet Take 1,000 mcg by mouth daily.    Marland Kitchen ezetimibe (ZETIA) 10 MG tablet Take 1 tablet (10 mg total) by mouth daily. (Patient not taking: Reported on 05/03/2020) 90 tablet 3   No current facility-administered medications for this visit.    OBJECTIVE: There were no vitals filed for this visit.   There is no height or weight on file to calculate BMI.    ECOG FS:0 - Asymptomatic  General: Well-developed, well-nourished, no acute distress. HEENT: Normocephalic. Neuro: Alert, answering all questions appropriately. Cranial nerves grossly intact. Psych: Normal affect.   LAB RESULTS:  Lab Results  Component Value Date   NA 141 05/03/2020   K 3.8 05/03/2020   CL 105 05/03/2020   CO2 28 05/03/2020   GLUCOSE 83 05/03/2020   BUN 28 (H) 05/03/2020   CREATININE 1.19 05/03/2020   CALCIUM 10.1 05/03/2020   PROT 6.3 05/03/2020    ALBUMIN 4.3 05/03/2020   AST 18 05/03/2020   ALT 15 05/03/2020   ALKPHOS 37 (L) 05/03/2020   BILITOT 0.7 05/03/2020   GFRNONAA 42 (L) 06/01/2017   GFRAA 49 (L) 06/01/2017    Lab Results  Component Value Date   WBC 5.5 05/03/2020   NEUTROABS 2.6 06/01/2017   HGB 14.1 05/03/2020   HCT 41.2 05/03/2020   MCV 87.3 05/03/2020   PLT 121.0 Repeated and verified X2. (L) 05/03/2020     STUDIES: DG Bone Density  Result Date: 06/10/2020 EXAM: DUAL X-RAY ABSORPTIOMETRY (DXA) FOR BONE MINERAL DENSITY IMPRESSION: Your patient Gwendolyn King completed a BMD test on 06/10/2020 using the [employer] iDXA DXA System (software version: 14.10) manufactured by [employer]. The following summarizes the results of our evaluation. Technologist: MTB PATIENT BIOGRAPHICAL: Name: Gwendolyn King, Gwendolyn King Patient ID: [id number] Birth Date: [birth year] Height: 60.0 in. Gender: Female Exam Date: 06/10/2020 Weight: 162.0 lbs. Indications: Advanced Age, Caucasian, Breast CA, High Risk Meds, Hypothyroid, Postmenopausal, Hysterectomy Fractures: Treatments: calcium w/ vit D, Fosamax, Letrozole, LEVOTHYROXINE, Vitamin D DENSITOMETRY RESULTS: Site         Region     Measured Date Measured Age WHO Classification Young Adult T-score BMD         %Change vs. Previous Significant Change (*) DualFemur Neck Right 06/10/2020 77.4 Osteopenia -1.4 0.843 g/cm2 2.4% - DualFemur Neck Right 06/10/2019 76.4 Osteopenia -1.5 0.823 g/cm2 0.7% - DualFemur Neck Right 06/06/2018 75.3 Osteopenia -1.6 0.817 g/cm2 -0.5% - DualFemur Neck Right 05/17/2016 73.3 Osteopenia -1.6 0.821 g/cm2 - - DualFemur Total Mean 06/10/2020 77.4 Normal -0.9 0.891 g/cm2 -0.2% - DualFemur Total Mean 06/10/2019 76.4 Normal -0.9 0.893 g/cm2 0.3% - DualFemur Total Mean 06/06/2018 75.3 Normal -0.9 0.890 g/cm2 -1.4% - DualFemur Total Mean 05/17/2016 73.3 Normal -0.8 0.903 g/cm2 - - Left Forearm Radius 33% 06/10/2020 77.4 Osteoporosis -3.1 0.605 g/cm2 -5.9% Yes Left Forearm Radius 33%  06/10/2019 76.4 Osteoporosis -2.7 0.643 g/cm2 -3.5% - Left Forearm Radius 33% 06/06/2018 75.3 Osteopenia -2.4 0.666 g/cm2 -5.1% - Left Forearm Radius 33% 05/17/2016 73.3 Osteopenia -2.0 0.702 g/cm2 - - ASSESSMENT: The BMD measured at Forearm Radius 33% is 0.605 g/cm2 with a T-score of -3.1. This patient  is considered osteoporotic according to [facility]) criteria. The scan quality is good. Lumbar spine was not utilized due to advanced degenerative changes. Patient is not a candidate for FRAX due to Fosamax. Compared with prior study, there has been no significant change in the total hip. [facility] Pharmacologist [facility]) criteria for post-menopausal, Caucasian Women: Normal:                   T-score at or above -1 SD Osteopenia/low bone mass: T-score between -1 and -2.5 SD Osteoporosis:             T-score at or below -2.5 SD RECOMMENDATIONS: 1. All patients should optimize calcium and vitamin D intake. 2. Consider FDA-approved medical therapies in postmenopausal women and men aged 81 years and older, based on the following: a. A hip or vertebral(clinical or morphometric) fracture b. T-score < -2.5 at the femoral neck or spine after appropriate evaluation to exclude secondary causes c. Low bone mass (T-score between -1.0 and -2.5 at the femoral neck or spine) and a 10-year probability of a hip fracture > 3% or a 10-year probability of a major osteoporosis-related fracture > 20% based on the [place]-adapted WHO algorithm 3. Clinician judgment and/or patient preferences may indicate treatment for people with 10-year fracture probabilities above or below these levels FOLLOW-UP: People with diagnosed cases of osteoporosis or at high risk for fracture should have regular bone mineral density tests. For patients eligible for Medicare, routine testing is allowed once every 2 years. The testing frequency can be increased to one year for patients who have rapidly progressing disease, those who are receiving or  discontinuing medical therapy to restore bone mass, or have additional risk factors. I have reviewed this report, and agree with the above findings. [facility] Electronically Signed   By: Lowella Grip III M.D.   On: 06/10/2020 15:19   MM DIAG BREAST TOMO BILATERAL  Result Date: 06/10/2020 CLINICAL DATA:  79 year old female for annual follow-up. History of RIGHT breast cancer and lumpectomy in 2018.Biopsied calcifications at the lumpectomy site in 2020 demonstrating benign dystrophic calcifications. EXAM: DIGITAL DIAGNOSTIC BILATERAL MAMMOGRAM WITH CAD AND TOMO COMPARISON:  Previous exam(s). ACR Breast Density Category b: There are scattered areas of fibroglandular density. FINDINGS: 2D and 3D full field views of both breasts and a magnification view of the lumpectomy site demonstrate no suspicious mass, nonsurgical distortion or worrisome calcifications. RIGHT lumpectomy changes are again noted. Mammographic images were processed with CAD. IMPRESSION: No evidence of breast malignancy. RECOMMENDATION: Bilateral diagnostic mammogram in 1 year. I have discussed the findings and recommendations with the patient. If applicable, a reminder letter will be sent to the patient regarding the next appointment. BI-RADS CATEGORY  2: Benign. Electronically Signed   By: Margarette Canada M.D.   On: 06/10/2020 14:40    ASSESSMENT: DCIS, right breast  PLAN:    1. DCIS, right breast: Patient underwent lumpectomy on June 07, 2017.  Final pathology was reviewed and no invasive component was noted, therefore patient did not require chemotherapy.  She completed adjuvant XRT in January 2019.  Because of patient's persistent hot flashes, tamoxifen was discontinued and patient was initiated on letrozole.  Patient expressed understanding that although tamoxifen is the recommended treatment, there is still significant benefit with aromatase inhibitors.  Continue treatment for a total of 5 years completing in  January 2024.  Her most recent mammogram on June 10, 2020 was reported as BI-RADS 2.  Repeat in October 2022.  2.  Thrombocytopenia: Chronic and unchanged. 3.  Osteoporosis: Patient's most recent bone mineral density on June 10, 2020 revealed a T score of -3.1 which is worse than 1 and 2 years prior.  Patient does not seem to benefit from Fosamax and has been instructed to discontinue.  She will return to clinic in 1 to 2 weeks to initiate Prolia.  She has been instructed to continue her calcium and vitamin D supplementation.  Repeat bone mineral density in October 2022.    I provided 20 minutes of face-to-face video visit time during this encounter which included chart review, counseling, and coordination of care as documented above.    Patient expressed understanding and was in agreement with this plan. She also understands that She can call clinic at any time with any questions, concerns, or complaints.   Cancer Staging Ductal carcinoma in situ (DCIS) of right breast Staging form: Breast, AJCC 8th Edition - Clinical stage from 08/22/2017: Stage 0 (cTis (DCIS), cN0, cM0, ER: Positive, PR: Negative, HER2: Negative) - Signed by Lloyd Huger, MD on 08/22/2017   Lloyd Huger, MD   06/15/2020 6:47 AM

## 2020-06-14 ENCOUNTER — Inpatient Hospital Stay: Payer: PPO | Attending: Oncology | Admitting: Oncology

## 2020-06-14 ENCOUNTER — Encounter: Payer: Self-pay | Admitting: Oncology

## 2020-06-14 DIAGNOSIS — D0511 Intraductal carcinoma in situ of right breast: Secondary | ICD-10-CM

## 2020-06-14 DIAGNOSIS — M810 Age-related osteoporosis without current pathological fracture: Secondary | ICD-10-CM | POA: Diagnosis not present

## 2020-06-14 MED ORDER — LETROZOLE 2.5 MG PO TABS: 2.5000 mg | ORAL_TABLET | Freq: Every day | ORAL | 3 refills | Status: DC

## 2020-06-15 DIAGNOSIS — J3089 Other allergic rhinitis: Secondary | ICD-10-CM | POA: Diagnosis not present

## 2020-06-15 DIAGNOSIS — M810 Age-related osteoporosis without current pathological fracture: Secondary | ICD-10-CM | POA: Insufficient documentation

## 2020-06-15 DIAGNOSIS — J301 Allergic rhinitis due to pollen: Secondary | ICD-10-CM | POA: Diagnosis not present

## 2020-06-15 DIAGNOSIS — J3081 Allergic rhinitis due to animal (cat) (dog) hair and dander: Secondary | ICD-10-CM | POA: Diagnosis not present

## 2020-06-21 ENCOUNTER — Other Ambulatory Visit: Payer: Self-pay

## 2020-06-21 ENCOUNTER — Ambulatory Visit (INDEPENDENT_AMBULATORY_CARE_PROVIDER_SITE_OTHER): Payer: PPO

## 2020-06-21 DIAGNOSIS — Z23 Encounter for immunization: Secondary | ICD-10-CM

## 2020-06-22 DIAGNOSIS — J3089 Other allergic rhinitis: Secondary | ICD-10-CM | POA: Diagnosis not present

## 2020-06-22 DIAGNOSIS — J301 Allergic rhinitis due to pollen: Secondary | ICD-10-CM | POA: Diagnosis not present

## 2020-06-22 DIAGNOSIS — J3081 Allergic rhinitis due to animal (cat) (dog) hair and dander: Secondary | ICD-10-CM | POA: Diagnosis not present

## 2020-06-22 DIAGNOSIS — H1045 Other chronic allergic conjunctivitis: Secondary | ICD-10-CM | POA: Diagnosis not present

## 2020-06-27 NOTE — Progress Notes (Signed)
[place]  Telephone:([phone]) [phone] Fax:(336) [phone]  ID: SHAMERA YARBERRY OB: April 25, 1941  MR#: [id number]  PYP#:[id number]  Patient Care Team: Leone Haven, MD as PCP - General (Family Medicine) Minna Merritts, MD as Consulting Physician (Cardiology) Bary Castilla, Forest Gleason, MD (General Surgery)   CHIEF COMPLAINT: DCIS, right breast.  INTERVAL HISTORY: Patient returns to clinic today for further evaluation and initiation of Prolia. She continues to feels well and is asymptomatic. She continues to tolerate letrozole well.   She has no neurologic complaints.  She denies any recent fevers or illnesses.  She has a good appetite and denies weight loss. She denies any chest pain, shortness of breath, cough, or hemoptysis.  She denies any nausea, vomiting, constipation, or diarrhea.  She has no urinary complaints.  Patient offers no specific complaints today.  REVIEW OF SYSTEMS:   Review of Systems  Constitutional: Negative.  Negative for diaphoresis, fever, malaise/fatigue and weight loss.  Respiratory: Negative.  Negative for cough and shortness of breath.   Cardiovascular: Negative.  Negative for chest pain and leg swelling.  Gastrointestinal: Negative.  Negative for abdominal pain and constipation.  Genitourinary: Negative.  Negative for dysuria.  Musculoskeletal: Negative.  Negative for back pain.  Skin: Negative.  Negative for itching and rash.  Neurological: Negative.  Negative for sensory change, focal weakness and weakness.  Psychiatric/Behavioral: Negative.  The patient is not nervous/anxious.     As per HPI. Otherwise, a complete review of systems is negative.  PAST MEDICAL HISTORY: Past Medical History:  Diagnosis Date  . Allergy    hay fever, fall allergies  . Arthritis   . Asthma    as a child, dad was smoker  . 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  .  Cataract    Bil  . Chicken pox   . Chronic kidney disease    stage 3  . Colon polyp   . Constipation   . Hyperlipidemia   . Hypothyroidism   . Palpitations    over 20 yeras ago  . Personal history of radiation therapy   . Thyroid disease     PAST SURGICAL HISTORY: Past Surgical History:  Procedure Laterality Date  . ABDOMINAL HYSTERECTOMY  1981  . APPENDECTOMY  2012  . 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 BREAST BIOPSY Right 05/17/2017   [place] bx done at DR. Brynetts office, papillary carcinoma  . BREAST BIOPSY Right 06/18/2019   Stereo bc calcs COMPATIBLE WITH PRIOR SURGICAL SITE CHANGES  . BREAST LUMPECTOMY Right 06/07/2017   DUCTAL CARCINOMA IN SITU (DCIS) with microinvasion at 7:00 5 cmfn  . BREAST LUMPECTOMY Right 06/07/2017   PAPILLARY LESION CONSISTENT WITH PAPILLARY CARCINOMA at retroaerolar 7:00 1.5 cmfn  . BREAST LUMPECTOMY WITH SENTINEL LYMPH NODE BIOPSY Right 06/07/2017   Wide excision of 2 foci of high-grade DCIS, microinvasion noted only on core biopsy. 2 microscopic foci lateral inferior margins. Patient elected not to proceed to reexcision.  . COLONOSCOPY    . COLONOSCOPY W/ POLYPECTOMY    . CYSTOCELE REPAIR N/A 09/11/2019   Procedure: ANTERIOR COLPORRHAPHY;  Surgeon: Gae Dry, MD;  Location: [facility];  Service: Gynecology;  Laterality: N/A;  . FINGER FRACTURE SURGERY Right    5th  . HAMMER TOE SURGERY  09/20/2017  . JOINT REPLACEMENT     right knee  .  ROTATOR CUFF REPAIR Right   . TONSILLECTOMY AND ADENOIDECTOMY  1963  . TOTAL KNEE ARTHROPLASTY Right 10/17/2016   Procedure: TOTAL KNEE ARTHROPLASTY;  Surgeon: Melrose Nakayama, MD;  Location: [facility];  Service: Orthopedics;  Laterality: Right;  Marland Kitchen VAGINAL DELIVERY     3    FAMILY HISTORY: Family History  Problem Relation Age of Onset  . Arthritis Mother   . Heart disease Mother   . Hypertension Mother   . Arthritis  Father   . Heart disease Father   . Hypertension Father   . Heart attack Father   . Stroke Maternal Grandmother   . Cancer Maternal Aunt        abdominal?  . Hyperlipidemia Maternal Aunt   . Diabetes Paternal Aunt   . Glaucoma Brother   . Hyperlipidemia Other   . Diabetes Other   . Breast cancer Neg Hx     ADVANCED DIRECTIVES (Y/N):  N  HEALTH MAINTENANCE: Social History   Tobacco Use  . Smoking status: Never Smoker  . Smokeless tobacco: Never Used  Vaping Use  . Vaping Use: Never used  Substance Use Topics  . Alcohol use: Yes    Comment: wine occ  . Drug use: No     Colonoscopy:  PAP:  Bone density:  Lipid panel:  Allergies  Allergen Reactions  . Singulair [Montelukast] Itching  . Lipitor [Atorvastatin] Other (See Comments)    Myalgia, arthralgia  . Simvastatin Other (See Comments)    myalgia    Current Outpatient Medications  Medication Sig Dispense Refill  . acebutolol (SECTRAL) 200 MG capsule Take 1 capsule (200 mg total) by mouth daily. 90 capsule 2  . acetaminophen (TYLENOL) 500 MG tablet Take 1,000 mg by mouth at bedtime.    Marland Kitchen aspirin EC 81 MG tablet Take 81 mg by mouth at bedtime.     . AZELASTINE HCL OP Place 1 drop into both eyes 3 (three) times daily as needed (dry/irritated eyes.).    Marland Kitchen Biotin 5 MG TABS Take 5 mg by mouth daily.    . calcium-vitamin D (OSCAL WITH D) 500-200 MG-UNIT tablet Take 1 tablet by mouth daily.     . cholecalciferol (VITAMIN D) 25 MCG (1000 UNIT) tablet Take 1,000 Units by mouth every evening.    Marland Kitchen EPINEPHrine 0.3 mg/0.3 mL IJ SOAJ injection Inject 0.3 mg into the muscle as needed for anaphylaxis.    . fluticasone (FLONASE) 50 MCG/ACT nasal spray Place 1 spray into both nostrils daily as needed (allergies.).   3  . Krill Oil 500 MG CAPS Take 500 mg by mouth daily.    Marland Kitchen letrozole (FEMARA) 2.5 MG tablet Take 1 tablet (2.5 mg total) by mouth daily. 90 tablet 3  . levocetirizine (XYZAL) 5 MG tablet Take 5 mg by mouth every  evening.   3  . LEVOXYL 75 MCG tablet Take 1 tablet by mouth once daily 90 tablet 2  . Multiple Vitamin (MULTIVITAMIN WITH MINERALS) TABS tablet Take 1 tablet by mouth daily. Adult 50+    . polyethylene glycol powder (GLYCOLAX/MIRALAX) powder Take 17 g by mouth daily. With coffee    . vitamin B-12 (CYANOCOBALAMIN) 1000 MCG tablet Take 1,000 mcg by mouth daily.    Marland Kitchen ezetimibe (ZETIA) 10 MG tablet Take 1 tablet (10 mg total) by mouth daily. (Patient not taking: Reported on 05/03/2020) 90 tablet 3   No current facility-administered medications for this visit.    OBJECTIVE: Vitals:   07/02/20 1041  BP: Marland Kitchen)  145/52  Pulse: 64  Resp: 20  Temp: 97.8 F (36.6 C)  SpO2: 100%     Body mass index is 31.37 kg/m.    ECOG FS:0 - Asymptomatic  General: Well-developed, well-nourished, no acute distress. Eyes: Pink conjunctiva, anicteric sclera. HEENT: Normocephalic, moist mucous membranes. Lungs: No audible wheezing or coughing. Heart: Regular rate and rhythm. Abdomen: Soft, nontender, no obvious distention. Musculoskeletal: No edema, cyanosis, or clubbing. Neuro: Alert, answering all questions appropriately. Cranial nerves grossly intact. Skin: No rashes or petechiae noted. Psych: Normal affect.  LAB RESULTS:  Lab Results  Component Value Date   NA 140 07/02/2020   K 3.8 07/02/2020   CL 105 07/02/2020   CO2 28 07/02/2020   GLUCOSE 88 07/02/2020   BUN 20 07/02/2020   CREATININE 1.09 (H) 07/02/2020   CALCIUM 10.0 07/02/2020   PROT 6.3 05/03/2020   ALBUMIN 4.3 05/03/2020   AST 18 05/03/2020   ALT 15 05/03/2020   ALKPHOS 37 (L) 05/03/2020   BILITOT 0.7 05/03/2020   GFRNONAA 52 (L) 07/02/2020   GFRAA 49 (L) 06/01/2017    Lab Results  Component Value Date   WBC 5.5 05/03/2020   NEUTROABS 2.6 06/01/2017   HGB 14.1 05/03/2020   HCT 41.2 05/03/2020   MCV 87.3 05/03/2020   PLT 121.0 Repeated and verified X2. (L) 05/03/2020     STUDIES: DG Bone Density  Result Date:  06/10/2020 EXAM: DUAL X-RAY ABSORPTIOMETRY (DXA) FOR BONE MINERAL DENSITY IMPRESSION: Your patient Era Parr completed a BMD test on 06/10/2020 using the [place] (software version: 14.10) manufactured by [employer]. The following summarizes the results of our evaluation. Technologist: MTB PATIENT BIOGRAPHICAL: Name: Deanie, Jupiter Patient ID: [id number] Birth Date: Nov 16, 1940 Height: 60.0 in. Gender: Female Exam Date: 06/10/2020 Weight: 162.0 lbs. Indications: Advanced Age, Caucasian, Breast CA, High Risk Meds, Hypothyroid, Postmenopausal, Hysterectomy Fractures: Treatments: calcium w/ vit D, Fosamax, Letrozole, LEVOTHYROXINE, Vitamin D DENSITOMETRY RESULTS: Site         Region     Measured Date Measured Age WHO Classification Young Adult T-score BMD         %Change vs. Previous Significant Change (*) DualFemur Neck Right 06/10/2020 77.4 Osteopenia -1.4 0.843 g/cm2 2.4% - DualFemur Neck Right 06/10/2019 76.4 Osteopenia -1.5 0.823 g/cm2 0.7% - DualFemur Neck Right 06/06/2018 75.3 Osteopenia -1.6 0.817 g/cm2 -0.5% - DualFemur Neck Right 05/17/2016 73.3 Osteopenia -1.6 0.821 g/cm2 - - DualFemur Total Mean 06/10/2020 77.4 Normal -0.9 0.891 g/cm2 -0.2% - DualFemur Total Mean 06/10/2019 76.4 Normal -0.9 0.893 g/cm2 0.3% - DualFemur Total Mean 06/06/2018 75.3 Normal -0.9 0.890 g/cm2 -1.4% - DualFemur Total Mean 05/17/2016 73.3 Normal -0.8 0.903 g/cm2 - - Left Forearm Radius 33% 06/10/2020 77.4 Osteoporosis -3.1 0.605 g/cm2 -5.9% Yes Left Forearm Radius 33% 06/10/2019 76.4 Osteoporosis -2.7 0.643 g/cm2 -3.5% - Left Forearm Radius 33% 06/06/2018 75.3 Osteopenia -2.4 0.666 g/cm2 -5.1% - Left Forearm Radius 33% 05/17/2016 73.3 Osteopenia -2.0 0.702 g/cm2 - - ASSESSMENT: The BMD measured at Forearm Radius 33% is 0.605 g/cm2 with a T-score of -3.1. This patient is considered osteoporotic according to [facility]) criteria. The scan quality is good. Lumbar spine was not utilized  due to advanced degenerative changes. Patient is not a candidate for FRAX due to Fosamax. Compared with prior study, there has been no significant change in the total hip. World Health Organization [facility]) criteria for post-menopausal, Caucasian Women: Normal:  T-score at or above -1 SD Osteopenia/low bone mass: T-score between -1 and -2.5 SD Osteoporosis:             T-score at or below -2.5 SD RECOMMENDATIONS: 1. All patients should optimize calcium and vitamin D intake. 2. Consider FDA-approved medical therapies in postmenopausal women and men aged 72 years and older, based on the following: a. A hip or vertebral(clinical or morphometric) fracture b. T-score < -2.5 at the femoral neck or spine after appropriate evaluation to exclude secondary causes c. Low bone mass (T-score between -1.0 and -2.5 at the femoral neck or spine) and a 10-year probability of a hip fracture > 3% or a 10-year probability of a major osteoporosis-related fracture > 20% based on the [place]-adapted WHO algorithm 3. Clinician judgment and/or patient preferences may indicate treatment for people with 10-year fracture probabilities above or below these levels FOLLOW-UP: People with diagnosed cases of osteoporosis or at high risk for fracture should have regular bone mineral density tests. For patients eligible for Medicare, routine testing is allowed once every 2 years. The testing frequency can be increased to one year for patients who have rapidly progressing disease, those who are receiving or discontinuing medical therapy to restore bone mass, or have additional risk factors. I have reviewed this report, and agree with the above findings. [facility] Electronically Signed   By: Lowella Grip III M.D.   On: 06/10/2020 15:19   MM DIAG BREAST TOMO BILATERAL  Result Date: 06/10/2020 CLINICAL DATA:  79 year old female for annual follow-up. History of RIGHT breast cancer and lumpectomy in 2018.Biopsied  calcifications at the lumpectomy site in 2020 demonstrating benign dystrophic calcifications. EXAM: DIGITAL DIAGNOSTIC BILATERAL MAMMOGRAM WITH CAD AND TOMO COMPARISON:  Previous exam(s). ACR Breast Density Category b: There are scattered areas of fibroglandular density. FINDINGS: 2D and 3D full field views of both breasts and a magnification view of the lumpectomy site demonstrate no suspicious mass, nonsurgical distortion or worrisome calcifications. RIGHT lumpectomy changes are again noted. Mammographic images were processed with CAD. IMPRESSION: No evidence of breast malignancy. RECOMMENDATION: Bilateral diagnostic mammogram in 1 year. I have discussed the findings and recommendations with the patient. If applicable, a reminder letter will be sent to the patient regarding the next appointment. BI-RADS CATEGORY  2: Benign. Electronically Signed   By: Margarette Canada M.D.   On: 06/10/2020 14:40    ASSESSMENT: DCIS, right breast  PLAN:    1. DCIS, right breast: Patient underwent lumpectomy on June 07, 2017.  Final pathology was reviewed and no invasive component was noted, therefore patient did not require chemotherapy.  She completed adjuvant XRT in January 2019.  Because of patient's persistent hot flashes, tamoxifen was discontinued and patient was initiated on letrozole.  Patient expressed understanding that although tamoxifen is the recommended treatment, there is still significant benefit with aromatase inhibitors.  Continue treatment for a total of 5 years completing in January 2024.  Her most recent mammogram on June 10, 2020 was reported as BI-RADS 2.  Repeat in October 2022.   2.  Thrombocytopenia: Chronic and unchanged. Her most recent platelet count was 121. 3.  Osteoporosis: Patient's most recent bone mineral density on June 10, 2020 revealed a T score of -3.1 which is worse than 1 and 2 years prior.  Patient does not seem to benefit from Fosamax and has been instructed to discontinue.  Proceed with Prolia today. Continue her calcium and vitamin D supplementation.  Repeat bone mineral density in October 2022.  Return to clinic in 6 months for repeat lab work and continuation of Prolia.     I spent a total of 30 minutes reviewing chart data, face-to-face evaluation with the patient, counseling and coordination of care as detailed above.   Patient expressed understanding and was in agreement with this plan. She also understands that She can call clinic at any time with any questions, concerns, or complaints.   Cancer Staging Ductal carcinoma in situ (DCIS) of right breast Staging form: Breast, AJCC 8th Edition - Clinical stage from 08/22/2017: Stage 0 (cTis (DCIS), cN0, cM0, ER: Positive, PR: Negative, HER2: Negative) - Signed by Lloyd Huger, MD on 08/22/2017   Lloyd Huger, MD   07/04/2020 8:14 AM

## 2020-06-29 DIAGNOSIS — J3081 Allergic rhinitis due to animal (cat) (dog) hair and dander: Secondary | ICD-10-CM | POA: Diagnosis not present

## 2020-06-29 DIAGNOSIS — Z853 Personal history of malignant neoplasm of breast: Secondary | ICD-10-CM | POA: Diagnosis not present

## 2020-06-29 DIAGNOSIS — J301 Allergic rhinitis due to pollen: Secondary | ICD-10-CM | POA: Diagnosis not present

## 2020-06-29 DIAGNOSIS — J3089 Other allergic rhinitis: Secondary | ICD-10-CM | POA: Diagnosis not present

## 2020-07-02 ENCOUNTER — Encounter: Payer: Self-pay | Admitting: Oncology

## 2020-07-02 ENCOUNTER — Inpatient Hospital Stay: Payer: PPO

## 2020-07-02 ENCOUNTER — Inpatient Hospital Stay: Payer: PPO | Attending: Oncology | Admitting: Oncology

## 2020-07-02 ENCOUNTER — Other Ambulatory Visit: Payer: Self-pay

## 2020-07-02 VITALS — BP 145/52 | HR 64 | Temp 97.8°F | Resp 20 | Wt 160.6 lb

## 2020-07-02 DIAGNOSIS — M810 Age-related osteoporosis without current pathological fracture: Secondary | ICD-10-CM | POA: Insufficient documentation

## 2020-07-02 DIAGNOSIS — D0511 Intraductal carcinoma in situ of right breast: Secondary | ICD-10-CM

## 2020-07-02 LAB — BASIC METABOLIC PANEL WITH GFR
Anion gap: 7 (ref 5–15)
BUN: 20 mg/dL (ref 8–23)
CO2: 28 mmol/L (ref 22–32)
Calcium: 10 mg/dL (ref 8.9–10.3)
Chloride: 105 mmol/L (ref 98–111)
Creatinine, Ser: 1.09 mg/dL — ABNORMAL HIGH (ref 0.44–1.00)
GFR, Estimated: 52 mL/min — ABNORMAL LOW
Glucose, Bld: 88 mg/dL (ref 70–99)
Potassium: 3.8 mmol/L (ref 3.5–5.1)
Sodium: 140 mmol/L (ref 135–145)

## 2020-07-02 MED ORDER — DENOSUMAB 60 MG/ML ~~LOC~~ SOSY
60.0000 mg | PREFILLED_SYRINGE | Freq: Once | SUBCUTANEOUS | Status: AC
  Administered 2020-07-02: 60 mg via SUBCUTANEOUS
  Filled 2020-07-02: qty 1

## 2020-07-05 ENCOUNTER — Encounter: Payer: Self-pay | Admitting: Oncology

## 2020-07-06 DIAGNOSIS — J3089 Other allergic rhinitis: Secondary | ICD-10-CM | POA: Diagnosis not present

## 2020-07-06 DIAGNOSIS — J301 Allergic rhinitis due to pollen: Secondary | ICD-10-CM | POA: Diagnosis not present

## 2020-07-06 DIAGNOSIS — J3081 Allergic rhinitis due to animal (cat) (dog) hair and dander: Secondary | ICD-10-CM | POA: Diagnosis not present

## 2020-07-08 ENCOUNTER — Ambulatory Visit: Payer: PPO

## 2020-07-08 ENCOUNTER — Ambulatory Visit: Payer: PPO | Admitting: Oncology

## 2020-07-08 ENCOUNTER — Other Ambulatory Visit: Payer: PPO

## 2020-07-20 DIAGNOSIS — J301 Allergic rhinitis due to pollen: Secondary | ICD-10-CM | POA: Diagnosis not present

## 2020-07-20 DIAGNOSIS — J3089 Other allergic rhinitis: Secondary | ICD-10-CM | POA: Diagnosis not present

## 2020-07-20 DIAGNOSIS — J3081 Allergic rhinitis due to animal (cat) (dog) hair and dander: Secondary | ICD-10-CM | POA: Diagnosis not present

## 2020-07-27 DIAGNOSIS — J3081 Allergic rhinitis due to animal (cat) (dog) hair and dander: Secondary | ICD-10-CM | POA: Diagnosis not present

## 2020-07-27 DIAGNOSIS — J301 Allergic rhinitis due to pollen: Secondary | ICD-10-CM | POA: Diagnosis not present

## 2020-07-27 DIAGNOSIS — J3089 Other allergic rhinitis: Secondary | ICD-10-CM | POA: Diagnosis not present

## 2020-08-03 DIAGNOSIS — J301 Allergic rhinitis due to pollen: Secondary | ICD-10-CM | POA: Diagnosis not present

## 2020-08-03 DIAGNOSIS — J3081 Allergic rhinitis due to animal (cat) (dog) hair and dander: Secondary | ICD-10-CM | POA: Diagnosis not present

## 2020-08-03 DIAGNOSIS — J3089 Other allergic rhinitis: Secondary | ICD-10-CM | POA: Diagnosis not present

## 2020-08-10 DIAGNOSIS — J3081 Allergic rhinitis due to animal (cat) (dog) hair and dander: Secondary | ICD-10-CM | POA: Diagnosis not present

## 2020-08-10 DIAGNOSIS — J3089 Other allergic rhinitis: Secondary | ICD-10-CM | POA: Diagnosis not present

## 2020-08-10 DIAGNOSIS — J301 Allergic rhinitis due to pollen: Secondary | ICD-10-CM | POA: Diagnosis not present

## 2020-08-17 DIAGNOSIS — J301 Allergic rhinitis due to pollen: Secondary | ICD-10-CM | POA: Diagnosis not present

## 2020-08-17 DIAGNOSIS — J3081 Allergic rhinitis due to animal (cat) (dog) hair and dander: Secondary | ICD-10-CM | POA: Diagnosis not present

## 2020-08-17 DIAGNOSIS — J3089 Other allergic rhinitis: Secondary | ICD-10-CM | POA: Diagnosis not present

## 2020-08-24 DIAGNOSIS — J3089 Other allergic rhinitis: Secondary | ICD-10-CM | POA: Diagnosis not present

## 2020-08-24 DIAGNOSIS — J3081 Allergic rhinitis due to animal (cat) (dog) hair and dander: Secondary | ICD-10-CM | POA: Diagnosis not present

## 2020-08-24 DIAGNOSIS — J301 Allergic rhinitis due to pollen: Secondary | ICD-10-CM | POA: Diagnosis not present

## 2020-08-31 DIAGNOSIS — J301 Allergic rhinitis due to pollen: Secondary | ICD-10-CM | POA: Diagnosis not present

## 2020-08-31 DIAGNOSIS — J3089 Other allergic rhinitis: Secondary | ICD-10-CM | POA: Diagnosis not present

## 2020-08-31 DIAGNOSIS — J3081 Allergic rhinitis due to animal (cat) (dog) hair and dander: Secondary | ICD-10-CM | POA: Diagnosis not present

## 2020-09-09 DIAGNOSIS — J301 Allergic rhinitis due to pollen: Secondary | ICD-10-CM | POA: Diagnosis not present

## 2020-09-09 DIAGNOSIS — J3089 Other allergic rhinitis: Secondary | ICD-10-CM | POA: Diagnosis not present

## 2020-09-09 DIAGNOSIS — J3081 Allergic rhinitis due to animal (cat) (dog) hair and dander: Secondary | ICD-10-CM | POA: Diagnosis not present

## 2020-09-14 DIAGNOSIS — J3081 Allergic rhinitis due to animal (cat) (dog) hair and dander: Secondary | ICD-10-CM | POA: Diagnosis not present

## 2020-09-14 DIAGNOSIS — J301 Allergic rhinitis due to pollen: Secondary | ICD-10-CM | POA: Diagnosis not present

## 2020-09-14 DIAGNOSIS — J3089 Other allergic rhinitis: Secondary | ICD-10-CM | POA: Diagnosis not present

## 2020-09-21 DIAGNOSIS — J3081 Allergic rhinitis due to animal (cat) (dog) hair and dander: Secondary | ICD-10-CM | POA: Diagnosis not present

## 2020-09-21 DIAGNOSIS — J3089 Other allergic rhinitis: Secondary | ICD-10-CM | POA: Diagnosis not present

## 2020-09-21 DIAGNOSIS — J301 Allergic rhinitis due to pollen: Secondary | ICD-10-CM | POA: Diagnosis not present

## 2020-09-28 DIAGNOSIS — J301 Allergic rhinitis due to pollen: Secondary | ICD-10-CM | POA: Diagnosis not present

## 2020-09-28 DIAGNOSIS — J3081 Allergic rhinitis due to animal (cat) (dog) hair and dander: Secondary | ICD-10-CM | POA: Diagnosis not present

## 2020-10-05 DIAGNOSIS — J3081 Allergic rhinitis due to animal (cat) (dog) hair and dander: Secondary | ICD-10-CM | POA: Diagnosis not present

## 2020-10-05 DIAGNOSIS — J301 Allergic rhinitis due to pollen: Secondary | ICD-10-CM | POA: Diagnosis not present

## 2020-10-05 DIAGNOSIS — J3089 Other allergic rhinitis: Secondary | ICD-10-CM | POA: Diagnosis not present

## 2020-10-08 DIAGNOSIS — J3089 Other allergic rhinitis: Secondary | ICD-10-CM | POA: Diagnosis not present

## 2020-10-08 DIAGNOSIS — J3081 Allergic rhinitis due to animal (cat) (dog) hair and dander: Secondary | ICD-10-CM | POA: Diagnosis not present

## 2020-10-08 DIAGNOSIS — J301 Allergic rhinitis due to pollen: Secondary | ICD-10-CM | POA: Diagnosis not present

## 2020-10-12 DIAGNOSIS — J301 Allergic rhinitis due to pollen: Secondary | ICD-10-CM | POA: Diagnosis not present

## 2020-10-12 DIAGNOSIS — J3081 Allergic rhinitis due to animal (cat) (dog) hair and dander: Secondary | ICD-10-CM | POA: Diagnosis not present

## 2020-10-12 DIAGNOSIS — J3089 Other allergic rhinitis: Secondary | ICD-10-CM | POA: Diagnosis not present

## 2020-10-13 ENCOUNTER — Encounter: Payer: Self-pay | Admitting: Radiation Oncology

## 2020-10-13 ENCOUNTER — Ambulatory Visit
Admission: RE | Admit: 2020-10-13 | Discharge: 2020-10-13 | Disposition: A | Discharge status: Home / Self Care | Payer: PPO | Source: Ambulatory Visit | Attending: Radiation Oncology | Admitting: Radiation Oncology

## 2020-10-13 ENCOUNTER — Other Ambulatory Visit: Payer: Self-pay

## 2020-10-13 VITALS — BP 136/69 | HR 59 | Temp 96.9°F | Wt 162.0 lb

## 2020-10-13 DIAGNOSIS — C50511 Malignant neoplasm of lower-outer quadrant of right female breast: Secondary | ICD-10-CM

## 2020-10-13 DIAGNOSIS — D0511 Intraductal carcinoma in situ of right breast: Secondary | ICD-10-CM | POA: Diagnosis not present

## 2020-10-13 DIAGNOSIS — Z08 Encounter for follow-up examination after completed treatment for malignant neoplasm: Secondary | ICD-10-CM | POA: Diagnosis not present

## 2020-10-13 DIAGNOSIS — Z170 Estrogen receptor positive status [ER+]: Secondary | ICD-10-CM | POA: Diagnosis not present

## 2020-10-13 NOTE — Progress Notes (Signed)
Radiation Oncology Follow up Note  Name: Gwendolyn King   Date:   10/13/2020 MRN:  [id number] DOB: 1941/04/05    This 80 y.o. 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  REFERRING PROVIDER: Leone Haven, MD  HPI: Patient is a 80 year old female now out 3 years having completed whole breast radiation to her right breast for a T1 invasive mammary carcinoma with extensive ductal carcinoma in situ tumor was ER positive PR negative.  Seen today in routine follow-up she is doing well.  She specifically denies breast tenderness cough or bone pain..  Mammograms back in October which I have reviewed were BI-RADS 2 benign.  She is currently on letrozole tolerating it well without side effect.  COMPLICATIONS OF TREATMENT: none  FOLLOW UP COMPLIANCE: keeps appointments   PHYSICAL EXAM:  BP 136/69   Pulse (!) 59   Temp (!) 96.9 F (36.1 C) (Tympanic)   Wt 162 lb (73.5 kg)   BMI 31.64 kg/m  Lungs are clear to A&P cardiac examination essentially unremarkable with regular rate and rhythm. No dominant mass or nodularity is noted in either breast in 2 positions examined. Incision is well-healed. No axillary or supraclavicular adenopathy is appreciated. Cosmetic result is excellent.  Well-developed well-nourished patient in NAD. HEENT reveals PERLA, EOMI, discs not visualized.  Oral cavity is clear. No oral mucosal lesions are identified. Neck is clear without evidence of cervical or supraclavicular adenopathy. Lungs are clear to A&P. Cardiac examination is essentially unremarkable with regular rate and rhythm without murmur rub or thrill. Abdomen is benign with no organomegaly or masses noted. Motor sensory and DTR levels are equal and symmetric in the upper and lower extremities. Cranial nerves II through XII are grossly intact. Proprioception is intact.  No peripheral adenopathy or edema is identified. No motor or sensory levels are noted. Crude visual fields are within normal range.  RADIOLOGY RESULTS: Mammograms reviewed compatible with above-stated findings  PLAN: Present time patient is doing well now out over 3 years with no evidence of disease.  She would like to discontinue follow-up care and I am turning that over to her oncologist.  Patient knows to call with any concerns at any time.  She continues on letrozole without side effect.  I would like to take this opportunity to thank you for allowing me to participate in the care of your patient.Noreene Filbert, MD

## 2020-10-19 DIAGNOSIS — J3081 Allergic rhinitis due to animal (cat) (dog) hair and dander: Secondary | ICD-10-CM | POA: Diagnosis not present

## 2020-10-19 DIAGNOSIS — J301 Allergic rhinitis due to pollen: Secondary | ICD-10-CM | POA: Diagnosis not present

## 2020-10-19 DIAGNOSIS — J3089 Other allergic rhinitis: Secondary | ICD-10-CM | POA: Diagnosis not present

## 2020-10-26 DIAGNOSIS — J301 Allergic rhinitis due to pollen: Secondary | ICD-10-CM | POA: Diagnosis not present

## 2020-10-26 DIAGNOSIS — J3089 Other allergic rhinitis: Secondary | ICD-10-CM | POA: Diagnosis not present

## 2020-10-26 DIAGNOSIS — J3081 Allergic rhinitis due to animal (cat) (dog) hair and dander: Secondary | ICD-10-CM | POA: Diagnosis not present

## 2020-10-28 ENCOUNTER — Other Ambulatory Visit: Payer: Self-pay

## 2020-11-01 ENCOUNTER — Ambulatory Visit (INDEPENDENT_AMBULATORY_CARE_PROVIDER_SITE_OTHER): Payer: PPO | Admitting: Family Medicine

## 2020-11-01 ENCOUNTER — Encounter: Payer: Self-pay | Admitting: Family Medicine

## 2020-11-01 ENCOUNTER — Other Ambulatory Visit: Payer: Self-pay

## 2020-11-01 VITALS — BP 130/80 | HR 61 | Temp 97.4°F | Ht 60.0 in | Wt 162.4 lb

## 2020-11-01 DIAGNOSIS — M791 Myalgia, unspecified site: Secondary | ICD-10-CM

## 2020-11-01 DIAGNOSIS — E782 Mixed hyperlipidemia: Secondary | ICD-10-CM

## 2020-11-01 DIAGNOSIS — E039 Hypothyroidism, unspecified: Secondary | ICD-10-CM

## 2020-11-01 DIAGNOSIS — D696 Thrombocytopenia, unspecified: Secondary | ICD-10-CM | POA: Diagnosis not present

## 2020-11-01 DIAGNOSIS — I700 Atherosclerosis of aorta: Secondary | ICD-10-CM | POA: Diagnosis not present

## 2020-11-01 DIAGNOSIS — I10 Essential (primary) hypertension: Secondary | ICD-10-CM | POA: Diagnosis not present

## 2020-11-01 DIAGNOSIS — I872 Venous insufficiency (chronic) (peripheral): Secondary | ICD-10-CM | POA: Diagnosis not present

## 2020-11-01 DIAGNOSIS — T466X5A Adverse effect of antihyperlipidemic and antiarteriosclerotic drugs, initial encounter: Secondary | ICD-10-CM | POA: Insufficient documentation

## 2020-11-01 NOTE — Assessment & Plan Note (Signed)
This is likely the cause of the minimal swelling that she has.  She will monitor.

## 2020-11-01 NOTE — Progress Notes (Signed)
Tommi Rumps, MD Phone: [phone]  Gwendolyn King is a 80 y.o. female who presents today for f/u.  HYPOTHYROIDISM Disease Monitoring Weight changes: no  Skin Changes: no  Heat/Cold intolerance: no  Medication Monitoring Compliance:  Taking levoxyl   Last TSH:   Lab Results  Component Value Date   TSH 2.47 05/03/2020   HYPERTENSION  Disease Monitoring  Home BP Monitoring 120s/60s when she does check Chest pain- no    Dyspnea- no Medications  Compliance-  Taking acebutolol.  Edema- minimal at times  Hyperlipidemia: Has been unable to tolerate statins in the past due to widespread myalgias.  She stopped Zetia as she thought it may have been contributing to itching though she is unsure if it was the Zetia or the Singulair.  She does ride a bike some for exercise.  Notes she eats fruits and vegetables though not as many as she should.  Typically has lean meats.  No history of stroke or heart attack.  Social History   Tobacco Use  Smoking Status Never Smoker  Smokeless Tobacco Never Used    Current Outpatient Medications on File Prior to Visit  Medication Sig Dispense Refill  . acebutolol (SECTRAL) 200 MG capsule Take 1 capsule (200 mg total) by mouth daily. 90 capsule 2  . acetaminophen (TYLENOL) 500 MG tablet Take 1,000 mg by mouth at bedtime.    Marland Kitchen aspirin EC 81 MG tablet Take 81 mg by mouth at bedtime.     . AZELASTINE HCL OP Place 1 drop into both eyes 3 (three) times daily as needed (dry/irritated eyes.).    Marland Kitchen Biotin 5 MG TABS Take 5 mg by mouth daily.    . calcium-vitamin D (OSCAL WITH D) 500-200 MG-UNIT tablet Take 1 tablet by mouth daily.     . cholecalciferol (VITAMIN D) 25 MCG (1000 UNIT) tablet Take 1,000 Units by mouth every evening.    Marland Kitchen EPINEPHrine 0.3 mg/0.3 mL IJ SOAJ injection Inject 0.3 mg into the muscle as needed for anaphylaxis.    . fluticasone (FLONASE) 50 MCG/ACT nasal spray Place 1 spray into both nostrils daily as needed (allergies.).   3  . Krill  Oil 500 MG CAPS Take 500 mg by mouth daily.    Marland Kitchen levocetirizine (XYZAL) 5 MG tablet Take 5 mg by mouth every evening.   3  . LEVOXYL 75 MCG tablet Take 1 tablet by mouth once daily 90 tablet 2  . Multiple Vitamin (MULTIVITAMIN WITH MINERALS) TABS tablet Take 1 tablet by mouth daily. Adult 50+    . polyethylene glycol powder (GLYCOLAX/MIRALAX) powder Take 17 g by mouth daily. With coffee    . vitamin B-12 (CYANOCOBALAMIN) 1000 MCG tablet Take 1,000 mcg by mouth daily.    Marland Kitchen ezetimibe (ZETIA) 10 MG tablet Take 1 tablet (10 mg total) by mouth daily. (Patient not taking: Reported on 11/01/2020) 90 tablet 3  . letrozole (FEMARA) 2.5 MG tablet Take 1 tablet (2.5 mg total) by mouth daily. (Patient not taking: Reported on 11/01/2020) 90 tablet 3   No current facility-administered medications on file prior to visit.     ROS see history of present illness  Objective  Physical Exam Vitals:   11/01/20 1123  BP: 130/80  Pulse: 61  Temp: (!) 97.4 F (36.3 C)  SpO2: 99%    BP Readings from Last 3 Encounters:  11/01/20 130/80  10/13/20 136/69  07/02/20 (!) 145/52   Wt Readings from Last 3 Encounters:  11/01/20 162 lb 6.4 oz (  73.7 kg)  10/13/20 162 lb (73.5 kg)  07/02/20 160 lb 9.6 oz (72.8 kg)    Physical Exam Constitutional:      General: She is not in acute distress.    Appearance: She is not diaphoretic.  Cardiovascular:     Rate and Rhythm: Normal rate and regular rhythm.     Heart sounds: Normal heart sounds.  Pulmonary:     Effort: Pulmonary effort is normal.     Breath sounds: Normal breath sounds.  Musculoskeletal:     Right lower leg: No edema.     Left lower leg: No edema.  Skin:    General: Skin is warm and dry.  Neurological:     Mental Status: She is alert.      Assessment/Plan: Please see individual problem list.  Problem List Items Addressed This Visit    Aortic atherosclerosis ([place]) - Primary    Risk factor management with good blood pressure control.   Unable to tolerate oral cholesterol medications.      Hyperlipidemia    Encouraged diet and exercise.  Intolerant of oral medications.      Hypertension    Adequate control.  Continue acebutolol 200 mg daily.      Hypothyroidism    This has been well controlled on Levoxyl 75 mcg once daily.  She will continue this.  Plan for TSH at her next visit.      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  We are unable to use statins due to this.      Thrombocytopenia, unspecified (HCC)    Mild.  Stable on most recent lab work.  We will continue to monitor.      Venous insufficiency    This is likely the cause of the minimal swelling that she has.  She will monitor.         This visit occurred during the SARS-CoV-2 public health emergency.  Safety protocols were in place, including screening questions prior to the visit, additional usage of staff PPE, and extensive cleaning of exam room while observing appropriate contact time as indicated for disinfecting solutions.    Tommi Rumps, MD [place]

## 2020-11-01 NOTE — Patient Instructions (Signed)
Nice to see you. Please try to eat a healthy diet as we discussed.  Please also try to get increased exercise. If your blood pressure trends up please let us know.

## 2020-11-01 NOTE — Assessment & Plan Note (Signed)
Adequate control.  Continue acebutolol 200 mg daily.

## 2020-11-01 NOTE — Assessment & Plan Note (Signed)
Mild.  Stable on most recent lab work.  We will continue to monitor.

## 2020-11-01 NOTE — Assessment & Plan Note (Signed)
Encouraged diet and exercise.  Intolerant of oral medications.

## 2020-11-01 NOTE — Assessment & Plan Note (Signed)
History of this.  We are unable to use statins due to this.

## 2020-11-01 NOTE — Assessment & Plan Note (Signed)
This has been well controlled on Levoxyl 75 mcg once daily.  She will continue this.  Plan for TSH at her next visit.

## 2020-11-01 NOTE — Assessment & Plan Note (Signed)
Risk factor management with good blood pressure control.  Unable to tolerate oral cholesterol medications.

## 2020-11-02 DIAGNOSIS — J3089 Other allergic rhinitis: Secondary | ICD-10-CM | POA: Diagnosis not present

## 2020-11-02 DIAGNOSIS — J301 Allergic rhinitis due to pollen: Secondary | ICD-10-CM | POA: Diagnosis not present

## 2020-11-02 DIAGNOSIS — J3081 Allergic rhinitis due to animal (cat) (dog) hair and dander: Secondary | ICD-10-CM | POA: Diagnosis not present

## 2020-11-09 ENCOUNTER — Encounter: Payer: Self-pay | Admitting: Obstetrics & Gynecology

## 2020-11-09 ENCOUNTER — Ambulatory Visit (INDEPENDENT_AMBULATORY_CARE_PROVIDER_SITE_OTHER): Payer: PPO | Admitting: Obstetrics & Gynecology

## 2020-11-09 ENCOUNTER — Other Ambulatory Visit: Payer: Self-pay

## 2020-11-09 VITALS — BP 130/80 | Ht 60.0 in | Wt 163.0 lb

## 2020-11-09 DIAGNOSIS — J3089 Other allergic rhinitis: Secondary | ICD-10-CM | POA: Diagnosis not present

## 2020-11-09 DIAGNOSIS — J3081 Allergic rhinitis due to animal (cat) (dog) hair and dander: Secondary | ICD-10-CM | POA: Diagnosis not present

## 2020-11-09 DIAGNOSIS — J301 Allergic rhinitis due to pollen: Secondary | ICD-10-CM | POA: Diagnosis not present

## 2020-11-09 DIAGNOSIS — N952 Postmenopausal atrophic vaginitis: Secondary | ICD-10-CM | POA: Diagnosis not present

## 2020-11-09 DIAGNOSIS — Z01419 Encounter for gynecological examination (general) (routine) without abnormal findings: Secondary | ICD-10-CM | POA: Diagnosis not present

## 2020-11-09 NOTE — Progress Notes (Signed)
HPI:      [name] is a 80 y.o. G3P3 who LMP was in the past, she presents today for her annual examination.  The patient has no complaints today.  She reports she has done well after surgery for anterior repair and cystocele last year, and is glad she does not have to use pessary any longer.  Rare urinary urgency sx's and rare vag burning sx's.  The patient is rarely sexually active. Herlast mammogram: approximate date 2021 and was normal.  The patient does perform self breast exams.  There is notable family history of breast or ovarian cancer in her family. The patient is not taking hormone replacement therapy. Patient denies post-menopausal vaginal bleeding.   The patient has regular exercise: yes. The patient denies current symptoms of depression.    GYN Hx: Last Colonoscopy:a few ago. Normal.  Last DEXA: few years ago.    PMHx: Past Medical History:  Diagnosis Date  . Allergy    hay fever, fall allergies  . Arthritis   . Asthma    as a child, dad was smoker  . 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  . Cataract    Bil  . Chicken pox   . Chronic kidney disease    stage 3  . Colon polyp   . Constipation   . Hyperlipidemia   . Hypothyroidism   . Palpitations    over 20 yeras ago  . Personal history of radiation therapy   . Thyroid disease    Past Surgical History:  Procedure Laterality Date  . ABDOMINAL HYSTERECTOMY  1981  . APPENDECTOMY  2012  . 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 BREAST BIOPSY Right 05/17/2017   [place] bx done at DR. Brynetts office, papillary carcinoma  . BREAST BIOPSY Right 06/18/2019   Stereo bc calcs COMPATIBLE WITH PRIOR SURGICAL SITE CHANGES  . BREAST LUMPECTOMY Right 06/07/2017   DUCTAL CARCINOMA IN SITU (DCIS) with microinvasion at 7:00 5 cmfn  . BREAST LUMPECTOMY Right  06/07/2017   PAPILLARY LESION CONSISTENT WITH PAPILLARY CARCINOMA at retroaerolar 7:00 1.5 cmfn  . BREAST LUMPECTOMY WITH SENTINEL LYMPH NODE BIOPSY Right 06/07/2017   Wide excision of 2 foci of high-grade DCIS, microinvasion noted only on core biopsy. 2 microscopic foci lateral inferior margins. Patient elected not to proceed to reexcision.  . COLONOSCOPY    . COLONOSCOPY W/ POLYPECTOMY    . CYSTOCELE REPAIR N/A 09/11/2019   Procedure: ANTERIOR COLPORRHAPHY;  Surgeon: Nadara Mustard, MD;  Location: [facility] ORS;  Service: Gynecology;  Laterality: N/A;  . FINGER FRACTURE SURGERY Right    5th  . HAMMER TOE SURGERY  09/20/2017  . JOINT REPLACEMENT     right knee  . ROTATOR CUFF REPAIR Right   . TONSILLECTOMY AND ADENOIDECTOMY  1963  . TOTAL KNEE ARTHROPLASTY Right 10/17/2016   Procedure: TOTAL KNEE ARTHROPLASTY;  Surgeon: Marcene Corning, MD;  Location: [facility];  Service: Orthopedics;  Laterality: Right;  Marland Kitchen VAGINAL DELIVERY     3   Family History  Problem Relation Age of Onset  . Arthritis Mother   . Heart disease Mother   . Hypertension Mother   . Arthritis Father   . Heart disease Father   . Hypertension Father   . Heart attack Father   . Stroke Maternal Grandmother   .  Cancer Maternal Aunt        abdominal?  . Hyperlipidemia Maternal Aunt   . Diabetes Paternal Aunt   . Glaucoma Brother   . Hyperlipidemia Other   . Diabetes Other   . Breast cancer Neg Hx    Social History   Tobacco Use  . Smoking status: Never Smoker  . Smokeless tobacco: Never Used  Vaping Use  . Vaping Use: Never used  Substance Use Topics  . Alcohol use: Yes    Comment: wine occ  . Drug use: No    Current Outpatient Medications:  .  acebutolol (SECTRAL) 200 MG capsule, Take 1 capsule (200 mg total) by mouth daily., Disp: 90 capsule, Rfl: 2 .  acetaminophen (TYLENOL) 500 MG tablet, Take 1,000 mg by mouth at bedtime., Disp: , Rfl:  .  aspirin EC 81 MG tablet, Take 81 mg by mouth at bedtime. , Disp: ,  Rfl:  .  AZELASTINE HCL OP, Place 1 drop into both eyes 3 (three) times daily as needed (dry/irritated eyes.)., Disp: , Rfl:  .  Biotin 5 MG TABS, Take 5 mg by mouth daily., Disp: , Rfl:  .  calcium-vitamin D (OSCAL WITH D) 500-200 MG-UNIT tablet, Take 1 tablet by mouth daily. , Disp: , Rfl:  .  cholecalciferol (VITAMIN D) 25 MCG (1000 UNIT) tablet, Take 1,000 Units by mouth every evening., Disp: , Rfl:  .  EPINEPHrine 0.3 mg/0.3 mL IJ SOAJ injection, Inject 0.3 mg into the muscle as needed for anaphylaxis., Disp: , Rfl:  .  ezetimibe (ZETIA) 10 MG tablet, Take 1 tablet (10 mg total) by mouth daily., Disp: 90 tablet, Rfl: 3 .  fluticasone (FLONASE) 50 MCG/ACT nasal spray, Place 1 spray into both nostrils daily as needed (allergies.). , Disp: , Rfl: 3 .  Krill Oil 500 MG CAPS, Take 500 mg by mouth daily., Disp: , Rfl:  .  letrozole (FEMARA) 2.5 MG tablet, Take 1 tablet (2.5 mg total) by mouth daily., Disp: 90 tablet, Rfl: 3 .  levocetirizine (XYZAL) 5 MG tablet, Take 5 mg by mouth every evening. , Disp: , Rfl: 3 .  LEVOXYL 75 MCG tablet, Take 1 tablet by mouth once daily, Disp: 90 tablet, Rfl: 2 .  Multiple Vitamin (MULTIVITAMIN WITH MINERALS) TABS tablet, Take 1 tablet by mouth daily. Adult 50+, Disp: , Rfl:  .  polyethylene glycol powder (GLYCOLAX/MIRALAX) powder, Take 17 g by mouth daily. With coffee, Disp: , Rfl:  .  vitamin B-12 (CYANOCOBALAMIN) 1000 MCG tablet, Take 1,000 mcg by mouth daily., Disp: , Rfl:  Allergies: Singulair [montelukast], Lipitor [atorvastatin], and Simvastatin  Review of Systems  Constitutional: Negative for chills, fever and malaise/fatigue.  HENT: Negative for congestion, sinus pain and sore throat.   Eyes: Negative for blurred vision and pain.  Respiratory: Negative for cough and wheezing.   Cardiovascular: Negative for chest pain and leg swelling.  Gastrointestinal: Negative for abdominal pain, constipation, diarrhea, heartburn, nausea and vomiting.   Genitourinary: Positive for urgency. Negative for dysuria, frequency and hematuria.  Musculoskeletal: Negative for back pain, joint pain, myalgias and neck pain.  Skin: Negative for itching and rash.  Neurological: Negative for dizziness, tremors and weakness.  Endo/Heme/Allergies: Does not bruise/bleed easily.  Psychiatric/Behavioral: Negative for depression. The patient is not nervous/anxious and does not have insomnia.     Objective: BP 130/80   Ht 5' (1.524 m)   Wt 163 lb (73.9 kg)   BMI 31.83 kg/m   [employer]   11/09/20  1026  Weight: 163 lb (73.9 kg)   Body mass index is 31.83 kg/m. Physical Exam Constitutional:      General: She is not in acute distress.    Appearance: She is well-developed.  Genitourinary:     Vulva, bladder, rectum and urethral meatus normal.     No lesions in the vagina.     Genitourinary Comments: Vaginal cuff well healed     Right Labia: No rash.    Left Labia: No rash.    No vaginal bleeding or granulation tissue.     No vaginal prolapse present.    Moderate vaginal atrophy present.     Right Adnexa: not tender and no mass present.    Left Adnexa: not tender and no mass present.    Cervix is absent.     Uterus is absent.     Pelvic exam was performed with patient in the lithotomy position.  Breasts:     Right: No mass, skin change or tenderness.     Left: No mass, skin change or tenderness.    HENT:     Head: Normocephalic and atraumatic. No laceration.     Right Ear: Hearing normal.     Left Ear: Hearing normal.     Mouth/Throat:     Pharynx: Uvula midline.  Eyes:     Pupils: Pupils are equal, round, and reactive to light.  Neck:     Thyroid: No thyromegaly.  Cardiovascular:     Rate and Rhythm: Normal rate and regular rhythm.     Heart sounds: No murmur heard. No friction rub. No gallop.   Pulmonary:     Effort: Pulmonary effort is normal. No respiratory distress.     Breath sounds: Normal breath sounds. No wheezing.   Abdominal:     General: Bowel sounds are normal. There is no distension.     Palpations: Abdomen is soft.     Tenderness: There is no abdominal tenderness. There is no rebound.  Musculoskeletal:        General: Normal range of motion.     Cervical back: Normal range of motion and neck supple.  Neurological:     Mental Status: She is alert and oriented to person, place, and time.     Cranial Nerves: No cranial nerve deficit.  Skin:    General: Skin is warm and dry.  Psychiatric:        Judgment: Judgment normal.  Vitals reviewed.     Assessment: Annual Exam 1. Women's annual routine gynecological examination   2. Vaginal atrophy     Plan:            1.  Vaginal Screening-  Pap smear schedule reviewed with patient, no more needed  2. Breast screening- Exam annually and mammogram scheduled  3. Colonoscopy every 10 years, Hemoccult testing after age 22  4. Labs managed by PCP  5. Counseling for hormonal therapy: none              6. FRAX - FRAX score for assessing the 10 year probability for fracture calculated and discussed today.  Based on age and score today, DEXA is not currently scheduled.    F/U  Return in about 1 year (around 11/09/2021) for Follow up.  Barnett Applebaum, MD, [facility], [facility] 11/09/2020  10:48 AM

## 2020-11-09 NOTE — Patient Instructions (Signed)
Recommendations to boost your immunity to prevent illness such as viral flu and colds, including covid19, are as follows:       - - -  Vitamin K2 and Vitamin D3  - - - Take Vitamin K2 at 200-300 mcg daily (usually 2-3 pills daily of the over the counter formulation). Take Vitamin D3 at 3000-4000 U daily (usually 3-4 pills daily of the over the counter formulation). Studies show that these two at high normal levels in your system are very effective in keeping your immunity so strong and protective that you will be unlikely to contract viral illness such as those listed above.  Dr Tangelia Sanson  Thank you for choosing [facility]. As part of our ongoing efforts to improve patient experience, we would appreciate your feedback. Please fill out the short survey that you will receive by mail or MyChart. Your opinion is important to us! -Dr Liba Hulsey   

## 2020-11-16 DIAGNOSIS — J301 Allergic rhinitis due to pollen: Secondary | ICD-10-CM | POA: Diagnosis not present

## 2020-11-16 DIAGNOSIS — J3081 Allergic rhinitis due to animal (cat) (dog) hair and dander: Secondary | ICD-10-CM | POA: Diagnosis not present

## 2020-11-16 DIAGNOSIS — J3089 Other allergic rhinitis: Secondary | ICD-10-CM | POA: Diagnosis not present

## 2020-11-23 DIAGNOSIS — J301 Allergic rhinitis due to pollen: Secondary | ICD-10-CM | POA: Diagnosis not present

## 2020-11-23 DIAGNOSIS — J3081 Allergic rhinitis due to animal (cat) (dog) hair and dander: Secondary | ICD-10-CM | POA: Diagnosis not present

## 2020-11-23 DIAGNOSIS — J3089 Other allergic rhinitis: Secondary | ICD-10-CM | POA: Diagnosis not present

## 2020-11-30 DIAGNOSIS — J3089 Other allergic rhinitis: Secondary | ICD-10-CM | POA: Diagnosis not present

## 2020-11-30 DIAGNOSIS — J301 Allergic rhinitis due to pollen: Secondary | ICD-10-CM | POA: Diagnosis not present

## 2020-11-30 DIAGNOSIS — J3081 Allergic rhinitis due to animal (cat) (dog) hair and dander: Secondary | ICD-10-CM | POA: Diagnosis not present

## 2020-12-07 DIAGNOSIS — J3089 Other allergic rhinitis: Secondary | ICD-10-CM | POA: Diagnosis not present

## 2020-12-07 DIAGNOSIS — J3081 Allergic rhinitis due to animal (cat) (dog) hair and dander: Secondary | ICD-10-CM | POA: Diagnosis not present

## 2020-12-07 DIAGNOSIS — J301 Allergic rhinitis due to pollen: Secondary | ICD-10-CM | POA: Diagnosis not present

## 2020-12-14 DIAGNOSIS — J301 Allergic rhinitis due to pollen: Secondary | ICD-10-CM | POA: Diagnosis not present

## 2020-12-14 DIAGNOSIS — J3089 Other allergic rhinitis: Secondary | ICD-10-CM | POA: Diagnosis not present

## 2020-12-14 DIAGNOSIS — J3081 Allergic rhinitis due to animal (cat) (dog) hair and dander: Secondary | ICD-10-CM | POA: Diagnosis not present

## 2020-12-21 DIAGNOSIS — J301 Allergic rhinitis due to pollen: Secondary | ICD-10-CM | POA: Diagnosis not present

## 2020-12-21 DIAGNOSIS — J3089 Other allergic rhinitis: Secondary | ICD-10-CM | POA: Diagnosis not present

## 2020-12-21 DIAGNOSIS — J3081 Allergic rhinitis due to animal (cat) (dog) hair and dander: Secondary | ICD-10-CM | POA: Diagnosis not present

## 2020-12-28 DIAGNOSIS — J301 Allergic rhinitis due to pollen: Secondary | ICD-10-CM | POA: Diagnosis not present

## 2020-12-28 DIAGNOSIS — J3081 Allergic rhinitis due to animal (cat) (dog) hair and dander: Secondary | ICD-10-CM | POA: Diagnosis not present

## 2020-12-28 DIAGNOSIS — J3089 Other allergic rhinitis: Secondary | ICD-10-CM | POA: Diagnosis not present

## 2020-12-31 ENCOUNTER — Encounter: Payer: Self-pay | Admitting: Oncology

## 2020-12-31 ENCOUNTER — Inpatient Hospital Stay: Payer: PPO

## 2020-12-31 ENCOUNTER — Other Ambulatory Visit: Payer: Self-pay

## 2020-12-31 ENCOUNTER — Inpatient Hospital Stay: Payer: PPO | Admitting: Oncology

## 2020-12-31 ENCOUNTER — Inpatient Hospital Stay: Payer: PPO | Attending: Oncology

## 2020-12-31 VITALS — BP 138/83 | HR 57 | Temp 97.5°F | Resp 20 | Wt 163.7 lb

## 2020-12-31 DIAGNOSIS — M810 Age-related osteoporosis without current pathological fracture: Secondary | ICD-10-CM

## 2020-12-31 DIAGNOSIS — D0511 Intraductal carcinoma in situ of right breast: Secondary | ICD-10-CM

## 2020-12-31 LAB — BASIC METABOLIC PANEL WITH GFR
Anion gap: 8 (ref 5–15)
BUN: 29 mg/dL — ABNORMAL HIGH (ref 8–23)
CO2: 27 mmol/L (ref 22–32)
Calcium: 10.1 mg/dL (ref 8.9–10.3)
Chloride: 106 mmol/L (ref 98–111)
Creatinine, Ser: 1.25 mg/dL — ABNORMAL HIGH (ref 0.44–1.00)
GFR, Estimated: 44 mL/min — ABNORMAL LOW
Glucose, Bld: 97 mg/dL (ref 70–99)
Potassium: 4 mmol/L (ref 3.5–5.1)
Sodium: 141 mmol/L (ref 135–145)

## 2020-12-31 MED ORDER — DENOSUMAB 60 MG/ML ~~LOC~~ SOSY
60.0000 mg | PREFILLED_SYRINGE | Freq: Once | SUBCUTANEOUS | Status: AC
  Administered 2020-12-31: 60 mg via SUBCUTANEOUS
  Filled 2020-12-31: qty 1

## 2020-12-31 NOTE — Progress Notes (Signed)
Patient denies any concerns today.  

## 2020-12-31 NOTE — Progress Notes (Signed)
[place]  Telephone:([phone]) [phone] Fax:([phone]) [phone]  ID: Gwendolyn King OB: 1941-07-15  MR#: [id number]  YHC#:[id number]  Patient Care Team: Leone Haven, MD as PCP - General (Family Medicine) Minna Merritts, MD as Consulting Physician (Cardiology) Bary Castilla, Forest Gleason, MD (General Surgery)   CHIEF COMPLAINT: DCIS, right breast.  INTERVAL HISTORY: Patient returns to clinic today for further evaluation and continuation of Prolia.   She was last seen in clinic on 07/02/2020.  They initiated Prolia for osteopenia.  Denies any side effects.  She reports back today for her next Prolia injection.  She continues to do well and tolerate letrozole.  Denies any new concerns.  REVIEW OF SYSTEMS:   Review of Systems  Constitutional: Negative.  Negative for diaphoresis, fever, malaise/fatigue and weight loss.  Respiratory: Negative.  Negative for cough and shortness of breath.   Cardiovascular: Negative.  Negative for chest pain and leg swelling.  Gastrointestinal: Negative.  Negative for abdominal pain and constipation.  Genitourinary: Negative.  Negative for dysuria.  Musculoskeletal: Negative.  Negative for back pain.  Skin: Negative.  Negative for itching and rash.  Neurological: Negative.  Negative for sensory change, focal weakness and weakness.  Psychiatric/Behavioral: Negative.  The patient is not nervous/anxious.     As per HPI. Otherwise, a complete review of systems is negative.  PAST MEDICAL HISTORY: Past Medical History:  Diagnosis Date  . Allergy    hay fever, fall allergies  . Arthritis   . Asthma    as a child, dad was smoker  . 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  . Cataract    Bil  . Chicken pox   . Chronic kidney disease    stage 3  . Colon polyp   . Constipation   . Hyperlipidemia   . Hypothyroidism   . Palpitations    over 20 yeras  ago  . Personal history of radiation therapy   . Thyroid disease     PAST SURGICAL HISTORY: Past Surgical History:  Procedure Laterality Date  . ABDOMINAL HYSTERECTOMY  1981  . APPENDECTOMY  2012  . 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 BREAST BIOPSY Right 05/17/2017   [place] bx done at DR. Brynetts office, papillary carcinoma  . BREAST BIOPSY Right 06/18/2019   Stereo bc calcs COMPATIBLE WITH PRIOR SURGICAL SITE CHANGES  . BREAST LUMPECTOMY Right 06/07/2017   DUCTAL CARCINOMA IN SITU (DCIS) with microinvasion at 7:00 5 cmfn  . BREAST LUMPECTOMY Right 06/07/2017   PAPILLARY LESION CONSISTENT WITH PAPILLARY CARCINOMA at retroaerolar 7:00 1.5 cmfn  . BREAST LUMPECTOMY WITH SENTINEL LYMPH NODE BIOPSY Right 06/07/2017   Wide excision of 2 foci of high-grade DCIS, microinvasion noted only on core biopsy. 2 microscopic foci lateral inferior margins. Patient elected not to proceed to reexcision.  . COLONOSCOPY    . COLONOSCOPY W/ POLYPECTOMY    . CYSTOCELE REPAIR N/A 09/11/2019   Procedure: ANTERIOR COLPORRHAPHY;  Surgeon: Gae Dry, MD;  Location: [facility] ORS;  Service: Gynecology;  Laterality: N/A;  . FINGER FRACTURE SURGERY Right    5th  . HAMMER TOE SURGERY  09/20/2017  . JOINT REPLACEMENT     right knee  . ROTATOR CUFF REPAIR Right   . TONSILLECTOMY AND ADENOIDECTOMY  1963  . TOTAL KNEE ARTHROPLASTY Right 10/17/2016   Procedure: TOTAL KNEE ARTHROPLASTY;  Surgeon: Melrose Nakayama, MD;  Location: [facility];  Service: Orthopedics;  Laterality: Right;  Marland Kitchen VAGINAL DELIVERY     3    FAMILY HISTORY: Family History  Problem Relation Age of Onset  . Arthritis Mother   . Heart disease Mother   . Hypertension Mother   . Arthritis Father   . Heart disease Father   . Hypertension Father   . Heart attack Father   . Stroke Maternal Grandmother   . Cancer Maternal Aunt        abdominal?  . Hyperlipidemia Maternal  Aunt   . Diabetes Paternal Aunt   . Glaucoma Brother   . Hyperlipidemia Other   . Diabetes Other   . Breast cancer Neg Hx     ADVANCED DIRECTIVES (Y/N):  N  HEALTH MAINTENANCE: Social History   Tobacco Use  . Smoking status: Never Smoker  . Smokeless tobacco: Never Used  Vaping Use  . Vaping Use: Never used  Substance Use Topics  . Alcohol use: Yes    Comment: wine occ  . Drug use: No     Colonoscopy:  PAP:  Bone density:  Lipid panel:  Allergies  Allergen Reactions  . Singulair [Montelukast] Itching  . Lipitor [Atorvastatin] Other (See Comments)    Myalgia, arthralgia  . Simvastatin Other (See Comments)    myalgia    Current Outpatient Medications  Medication Sig Dispense Refill  . acebutolol (SECTRAL) 200 MG capsule Take 1 capsule (200 mg total) by mouth daily. 90 capsule 2  . acetaminophen (TYLENOL) 500 MG tablet Take 1,000 mg by mouth at bedtime.    Marland Kitchen aspirin EC 81 MG tablet Take 81 mg by mouth at bedtime.     . AZELASTINE HCL OP Place 1 drop into both eyes 3 (three) times daily as needed (dry/irritated eyes.).    Marland Kitchen Biotin 5 MG TABS Take 5 mg by mouth daily.    . calcium-vitamin D (OSCAL WITH D) 500-200 MG-UNIT tablet Take 1 tablet by mouth daily.     . cholecalciferol (VITAMIN D) 25 MCG (1000 UNIT) tablet Take 1,000 Units by mouth every evening.    Marland Kitchen EPINEPHrine 0.3 mg/0.3 mL IJ SOAJ injection Inject 0.3 mg into the muscle as needed for anaphylaxis.    Marland Kitchen ezetimibe (ZETIA) 10 MG tablet Take 1 tablet (10 mg total) by mouth daily. 90 tablet 3  . fluticasone (FLONASE) 50 MCG/ACT nasal spray Place 1 spray into both nostrils daily as needed (allergies.).   3  . Krill Oil 500 MG CAPS Take 500 mg by mouth daily.    Marland Kitchen letrozole (FEMARA) 2.5 MG tablet Take 1 tablet (2.5 mg total) by mouth daily. 90 tablet 3  . levocetirizine (XYZAL) 5 MG tablet Take 5 mg by mouth every evening.   3  . LEVOXYL 75 MCG tablet Take 1 tablet by mouth once daily 90 tablet 2  . Multiple  Vitamin (MULTIVITAMIN WITH MINERALS) TABS tablet Take 1 tablet by mouth daily. Adult 50+    . polyethylene glycol powder (GLYCOLAX/MIRALAX) powder Take 17 g by mouth daily. With coffee    . vitamin B-12 (CYANOCOBALAMIN) 1000 MCG tablet Take 1,000 mcg by mouth daily.     No current facility-administered medications for this visit.    OBJECTIVE: There were no vitals filed for this visit.   There is no height or weight on file to calculate BMI.    ECOG FS:0 - Asymptomatic  Physical Exam Constitutional:      Appearance: Normal  appearance.  HENT:     Head: Normocephalic and atraumatic.  Eyes:     Pupils: Pupils are equal, round, and reactive to light.  Cardiovascular:     Rate and Rhythm: Normal rate and regular rhythm.     Heart sounds: Normal heart sounds. No murmur heard.   Pulmonary:     Effort: Pulmonary effort is normal.     Breath sounds: Normal breath sounds. No wheezing.  Abdominal:     General: Bowel sounds are normal. There is no distension.     Palpations: Abdomen is soft.     Tenderness: There is no abdominal tenderness.  Musculoskeletal:        General: Normal range of motion.     Cervical back: Normal range of motion.  Skin:    General: Skin is warm and dry.     Findings: No rash.  Neurological:     Mental Status: She is alert and oriented to person, place, and time.  Psychiatric:        Judgment: Judgment normal.     LAB RESULTS:  Lab Results  Component Value Date   NA 140 07/02/2020   K 3.8 07/02/2020   CL 105 07/02/2020   CO2 28 07/02/2020   GLUCOSE 88 07/02/2020   BUN 20 07/02/2020   CREATININE 1.09 (H) 07/02/2020   CALCIUM 10.0 07/02/2020   PROT 6.3 05/03/2020   ALBUMIN 4.3 05/03/2020   AST 18 05/03/2020   ALT 15 05/03/2020   ALKPHOS 37 (L) 05/03/2020   BILITOT 0.7 05/03/2020   GFRNONAA 52 (L) 07/02/2020   GFRAA 49 (L) 06/01/2017    Lab Results  Component Value Date   WBC 5.5 05/03/2020   NEUTROABS 2.6 06/01/2017   HGB 14.1 05/03/2020    HCT 41.2 05/03/2020   MCV 87.3 05/03/2020   PLT 121.0 Repeated and verified X2. (L) 05/03/2020     STUDIES: No results found.  ASSESSMENT: DCIS, right breast  PLAN:    1. DCIS, right breast:  -Patient underwent lumpectomy on June 07, 2017.   -Final pathology was reviewed and no invasive component was noted, therefore patient did not require chemotherapy.   -She completed adjuvant XRT in January 2019.  -Tamoxifen was discontinued secondary to hot flashes.  She was started on letrozole.  -Most recent mammogram from October 2021 was reported as BI-RADS 2. -Plan to repeat in October 2022.    2.  Thrombocytopenia:  -Chronic and unchanged.  -Her most recent platelet count was 121.  3.  Osteoporosis: -Most recent bone scan from 06/10/2021 shows a T score of -3.1 -She initiated Prolia on 07/02/2020.  -Proceed with Prolia today.  Calcium is 10.1. -She was instructed to continue calcium and vitamin D supplementation. -We will repeat bone density in October 2022.  Disposition: Proceed with Prolia today. Repeat mammogram and bone density in October.  RTC after mammogram and bone density to discuss results, MD assessment and Prolia injection.  Greater than 50% was spent in counseling and coordination of care with this patient including but not limited to discussion of the relevant topics above (See A&P) including, but not limited to diagnosis and management of acute and chronic medical conditions.   Patient expressed understanding and was in agreement with this plan. She also understands that She can call clinic at any time with any questions, concerns, or complaints.   Cancer Staging Ductal carcinoma in situ (DCIS) of right breast Staging form: Breast, AJCC 8th Edition - Clinical stage from 08/22/2017: Stage  0 (cTis (DCIS), cN0, cM0, ER: Positive, PR: Negative, HER2: Negative) - Signed by Lloyd Huger, MD on 08/22/2017 Laterality: Right   Jacquelin Hawking, NP   12/31/2020  10:40 AM

## 2021-01-07 DIAGNOSIS — J3081 Allergic rhinitis due to animal (cat) (dog) hair and dander: Secondary | ICD-10-CM | POA: Diagnosis not present

## 2021-01-07 DIAGNOSIS — J301 Allergic rhinitis due to pollen: Secondary | ICD-10-CM | POA: Diagnosis not present

## 2021-01-07 DIAGNOSIS — J3089 Other allergic rhinitis: Secondary | ICD-10-CM | POA: Diagnosis not present

## 2021-01-10 DIAGNOSIS — H2513 Age-related nuclear cataract, bilateral: Secondary | ICD-10-CM | POA: Diagnosis not present

## 2021-01-18 DIAGNOSIS — J301 Allergic rhinitis due to pollen: Secondary | ICD-10-CM | POA: Diagnosis not present

## 2021-01-18 DIAGNOSIS — J3081 Allergic rhinitis due to animal (cat) (dog) hair and dander: Secondary | ICD-10-CM | POA: Diagnosis not present

## 2021-01-18 DIAGNOSIS — J3089 Other allergic rhinitis: Secondary | ICD-10-CM | POA: Diagnosis not present

## 2021-01-25 DIAGNOSIS — J3081 Allergic rhinitis due to animal (cat) (dog) hair and dander: Secondary | ICD-10-CM | POA: Diagnosis not present

## 2021-01-25 DIAGNOSIS — J3089 Other allergic rhinitis: Secondary | ICD-10-CM | POA: Diagnosis not present

## 2021-01-25 DIAGNOSIS — J301 Allergic rhinitis due to pollen: Secondary | ICD-10-CM | POA: Diagnosis not present

## 2021-02-01 DIAGNOSIS — J301 Allergic rhinitis due to pollen: Secondary | ICD-10-CM | POA: Diagnosis not present

## 2021-02-01 DIAGNOSIS — J3089 Other allergic rhinitis: Secondary | ICD-10-CM | POA: Diagnosis not present

## 2021-02-01 DIAGNOSIS — J3081 Allergic rhinitis due to animal (cat) (dog) hair and dander: Secondary | ICD-10-CM | POA: Diagnosis not present

## 2021-02-08 DIAGNOSIS — J3081 Allergic rhinitis due to animal (cat) (dog) hair and dander: Secondary | ICD-10-CM | POA: Diagnosis not present

## 2021-02-08 DIAGNOSIS — J3089 Other allergic rhinitis: Secondary | ICD-10-CM | POA: Diagnosis not present

## 2021-02-08 DIAGNOSIS — J301 Allergic rhinitis due to pollen: Secondary | ICD-10-CM | POA: Diagnosis not present

## 2021-02-15 DIAGNOSIS — J3081 Allergic rhinitis due to animal (cat) (dog) hair and dander: Secondary | ICD-10-CM | POA: Diagnosis not present

## 2021-02-15 DIAGNOSIS — J3089 Other allergic rhinitis: Secondary | ICD-10-CM | POA: Diagnosis not present

## 2021-02-15 DIAGNOSIS — J301 Allergic rhinitis due to pollen: Secondary | ICD-10-CM | POA: Diagnosis not present

## 2021-02-22 DIAGNOSIS — J301 Allergic rhinitis due to pollen: Secondary | ICD-10-CM | POA: Diagnosis not present

## 2021-02-22 DIAGNOSIS — J3089 Other allergic rhinitis: Secondary | ICD-10-CM | POA: Diagnosis not present

## 2021-02-22 DIAGNOSIS — J3081 Allergic rhinitis due to animal (cat) (dog) hair and dander: Secondary | ICD-10-CM | POA: Diagnosis not present

## 2021-03-01 DIAGNOSIS — J3089 Other allergic rhinitis: Secondary | ICD-10-CM | POA: Diagnosis not present

## 2021-03-01 DIAGNOSIS — J301 Allergic rhinitis due to pollen: Secondary | ICD-10-CM | POA: Diagnosis not present

## 2021-03-01 DIAGNOSIS — J3081 Allergic rhinitis due to animal (cat) (dog) hair and dander: Secondary | ICD-10-CM | POA: Diagnosis not present

## 2021-03-03 DIAGNOSIS — J3081 Allergic rhinitis due to animal (cat) (dog) hair and dander: Secondary | ICD-10-CM | POA: Diagnosis not present

## 2021-03-03 DIAGNOSIS — J301 Allergic rhinitis due to pollen: Secondary | ICD-10-CM | POA: Diagnosis not present

## 2021-03-03 DIAGNOSIS — J3089 Other allergic rhinitis: Secondary | ICD-10-CM | POA: Diagnosis not present

## 2021-03-08 DIAGNOSIS — J3089 Other allergic rhinitis: Secondary | ICD-10-CM | POA: Diagnosis not present

## 2021-03-08 DIAGNOSIS — J301 Allergic rhinitis due to pollen: Secondary | ICD-10-CM | POA: Diagnosis not present

## 2021-03-08 DIAGNOSIS — J3081 Allergic rhinitis due to animal (cat) (dog) hair and dander: Secondary | ICD-10-CM | POA: Diagnosis not present

## 2021-03-15 DIAGNOSIS — J3081 Allergic rhinitis due to animal (cat) (dog) hair and dander: Secondary | ICD-10-CM | POA: Diagnosis not present

## 2021-03-15 DIAGNOSIS — J3089 Other allergic rhinitis: Secondary | ICD-10-CM | POA: Diagnosis not present

## 2021-03-15 DIAGNOSIS — J301 Allergic rhinitis due to pollen: Secondary | ICD-10-CM | POA: Diagnosis not present

## 2021-03-22 DIAGNOSIS — J3089 Other allergic rhinitis: Secondary | ICD-10-CM | POA: Diagnosis not present

## 2021-03-22 DIAGNOSIS — J3081 Allergic rhinitis due to animal (cat) (dog) hair and dander: Secondary | ICD-10-CM | POA: Diagnosis not present

## 2021-03-22 DIAGNOSIS — J301 Allergic rhinitis due to pollen: Secondary | ICD-10-CM | POA: Diagnosis not present

## 2021-03-29 DIAGNOSIS — J301 Allergic rhinitis due to pollen: Secondary | ICD-10-CM | POA: Diagnosis not present

## 2021-03-29 DIAGNOSIS — J3081 Allergic rhinitis due to animal (cat) (dog) hair and dander: Secondary | ICD-10-CM | POA: Diagnosis not present

## 2021-03-29 DIAGNOSIS — J3089 Other allergic rhinitis: Secondary | ICD-10-CM | POA: Diagnosis not present

## 2021-04-05 DIAGNOSIS — J3081 Allergic rhinitis due to animal (cat) (dog) hair and dander: Secondary | ICD-10-CM | POA: Diagnosis not present

## 2021-04-05 DIAGNOSIS — J301 Allergic rhinitis due to pollen: Secondary | ICD-10-CM | POA: Diagnosis not present

## 2021-04-05 DIAGNOSIS — J3089 Other allergic rhinitis: Secondary | ICD-10-CM | POA: Diagnosis not present

## 2021-04-12 DIAGNOSIS — J301 Allergic rhinitis due to pollen: Secondary | ICD-10-CM | POA: Diagnosis not present

## 2021-04-12 DIAGNOSIS — J3089 Other allergic rhinitis: Secondary | ICD-10-CM | POA: Diagnosis not present

## 2021-04-12 DIAGNOSIS — J3081 Allergic rhinitis due to animal (cat) (dog) hair and dander: Secondary | ICD-10-CM | POA: Diagnosis not present

## 2021-04-19 DIAGNOSIS — J301 Allergic rhinitis due to pollen: Secondary | ICD-10-CM | POA: Diagnosis not present

## 2021-04-19 DIAGNOSIS — J3081 Allergic rhinitis due to animal (cat) (dog) hair and dander: Secondary | ICD-10-CM | POA: Diagnosis not present

## 2021-04-19 DIAGNOSIS — J3089 Other allergic rhinitis: Secondary | ICD-10-CM | POA: Diagnosis not present

## 2021-04-26 DIAGNOSIS — J301 Allergic rhinitis due to pollen: Secondary | ICD-10-CM | POA: Diagnosis not present

## 2021-04-26 DIAGNOSIS — J3089 Other allergic rhinitis: Secondary | ICD-10-CM | POA: Diagnosis not present

## 2021-04-26 DIAGNOSIS — J3081 Allergic rhinitis due to animal (cat) (dog) hair and dander: Secondary | ICD-10-CM | POA: Diagnosis not present

## 2021-05-03 DIAGNOSIS — J301 Allergic rhinitis due to pollen: Secondary | ICD-10-CM | POA: Diagnosis not present

## 2021-05-03 DIAGNOSIS — J3081 Allergic rhinitis due to animal (cat) (dog) hair and dander: Secondary | ICD-10-CM | POA: Diagnosis not present

## 2021-05-03 DIAGNOSIS — J3089 Other allergic rhinitis: Secondary | ICD-10-CM | POA: Diagnosis not present

## 2021-05-04 ENCOUNTER — Ambulatory Visit (INDEPENDENT_AMBULATORY_CARE_PROVIDER_SITE_OTHER): Payer: PPO | Admitting: Family Medicine

## 2021-05-04 ENCOUNTER — Other Ambulatory Visit: Payer: Self-pay

## 2021-05-04 ENCOUNTER — Ambulatory Visit (INDEPENDENT_AMBULATORY_CARE_PROVIDER_SITE_OTHER): Payer: PPO

## 2021-05-04 VITALS — BP 120/80 | HR 59 | Temp 98.3°F | Ht 60.0 in | Wt 162.0 lb

## 2021-05-04 DIAGNOSIS — E782 Mixed hyperlipidemia: Secondary | ICD-10-CM | POA: Diagnosis not present

## 2021-05-04 DIAGNOSIS — I10 Essential (primary) hypertension: Secondary | ICD-10-CM | POA: Diagnosis not present

## 2021-05-04 DIAGNOSIS — I872 Venous insufficiency (chronic) (peripheral): Secondary | ICD-10-CM | POA: Diagnosis not present

## 2021-05-04 DIAGNOSIS — M545 Low back pain, unspecified: Secondary | ICD-10-CM | POA: Insufficient documentation

## 2021-05-04 DIAGNOSIS — G8929 Other chronic pain: Secondary | ICD-10-CM | POA: Diagnosis not present

## 2021-05-04 DIAGNOSIS — E039 Hypothyroidism, unspecified: Secondary | ICD-10-CM | POA: Diagnosis not present

## 2021-05-04 DIAGNOSIS — R600 Localized edema: Secondary | ICD-10-CM

## 2021-05-04 LAB — COMPREHENSIVE METABOLIC PANEL WITH GFR
ALT: 18 U/L (ref 0–35)
AST: 21 U/L (ref 0–37)
Albumin: 4.3 g/dL (ref 3.5–5.2)
Alkaline Phosphatase: 35 U/L — ABNORMAL LOW (ref 39–117)
BUN: 17 mg/dL (ref 6–23)
CO2: 28 meq/L (ref 19–32)
Calcium: 10.6 mg/dL — ABNORMAL HIGH (ref 8.4–10.5)
Chloride: 104 meq/L (ref 96–112)
Creatinine, Ser: 1.13 mg/dL (ref 0.40–1.20)
GFR: 46.67 mL/min — ABNORMAL LOW
Glucose, Bld: 85 mg/dL (ref 70–99)
Potassium: 4.1 meq/L (ref 3.5–5.1)
Sodium: 140 meq/L (ref 135–145)
Total Bilirubin: 0.7 mg/dL (ref 0.2–1.2)
Total Protein: 6.6 g/dL (ref 6.0–8.3)

## 2021-05-04 LAB — LIPID PANEL
Cholesterol: 203 mg/dL — ABNORMAL HIGH (ref 0–200)
HDL: 44.5 mg/dL
LDL Cholesterol: 119 mg/dL — ABNORMAL HIGH (ref 0–99)
NonHDL: 158.84
Total CHOL/HDL Ratio: 5
Triglycerides: 199 mg/dL — ABNORMAL HIGH (ref 0.0–149.0)
VLDL: 39.8 mg/dL (ref 0.0–40.0)

## 2021-05-04 LAB — CBC
HCT: 42.8 % (ref 36.0–46.0)
Hemoglobin: 14.5 g/dL (ref 12.0–15.0)
MCHC: 33.9 g/dL (ref 30.0–36.0)
MCV: 86.9 fl (ref 78.0–100.0)
Platelets: 122 10*3/uL — ABNORMAL LOW (ref 150.0–400.0)
RBC: 4.92 Mil/uL (ref 3.87–5.11)
RDW: 13.3 % (ref 11.5–15.5)
WBC: 4.9 10*3/uL (ref 4.0–10.5)

## 2021-05-04 LAB — TSH: TSH: 2.15 u[IU]/mL (ref 0.35–5.50)

## 2021-05-04 NOTE — Assessment & Plan Note (Signed)
Check TSH.  Continue Levoxyl 75 mcg once daily.

## 2021-05-04 NOTE — Assessment & Plan Note (Signed)
Well-controlled.  Continue acebutolol 200 mg daily.

## 2021-05-04 NOTE — Progress Notes (Signed)
Tommi Rumps, MD Phone: [phone]  Gwendolyn King is a 80 y.o. female who presents today for f/u.  Hypertension: Taking acebutolol.  Typically less than 120/80.  No chest pain or shortness of breath.  Has mild ankle edema that occurs occasionally.  No orthopnea or PND.  Hypothyroidism: Taking Levoxyl.  No skin changes.  No heat or cold intolerance.  Hyperlipidemia: Exercises some by riding a bike.  Due for lipid panel.  Chronic low back pain: This is an ongoing issue.  Notes occasional back discomfort.  No radiation, numbness, weakness, or incontinence.  Does note when she gets on the bike and extends her right leg she feels a popping sensation in her right low back.  There is no pain associated with that specifically.  Notes that improves if she sits back further on the bike.  Social History   Tobacco Use  Smoking Status Never  Smokeless Tobacco Never    Current Outpatient Medications on File Prior to Visit  Medication Sig Dispense Refill   acebutolol (SECTRAL) 200 MG capsule Take 1 capsule (200 mg total) by mouth daily. 90 capsule 2   acetaminophen (TYLENOL) 500 MG tablet Take 1,000 mg by mouth at bedtime.     aspirin EC 81 MG tablet Take 81 mg by mouth at bedtime.      AZELASTINE HCL OP Place 1 drop into both eyes 3 (three) times daily as needed (dry/irritated eyes.).     Biotin 5 MG TABS Take 5 mg by mouth daily.     calcium-vitamin D (OSCAL WITH D) 500-200 MG-UNIT tablet Take 1 tablet by mouth daily.      cholecalciferol (VITAMIN D) 25 MCG (1000 UNIT) tablet Take 1,000 Units by mouth every evening.     EPINEPHrine 0.3 mg/0.3 mL IJ SOAJ injection Inject 0.3 mg into the muscle as needed for anaphylaxis.     Krill Oil 500 MG CAPS Take 500 mg by mouth daily.     letrozole (FEMARA) 2.5 MG tablet Take 1 tablet (2.5 mg total) by mouth daily. 90 tablet 3   levocetirizine (XYZAL) 5 MG tablet Take 5 mg by mouth every evening.   3   LEVOXYL 75 MCG tablet Take 1 tablet by mouth once  daily 90 tablet 2   Multiple Vitamin (MULTIVITAMIN WITH MINERALS) TABS tablet Take 1 tablet by mouth daily. Adult 50+     polyethylene glycol powder (GLYCOLAX/MIRALAX) powder Take 17 g by mouth daily. With coffee     vitamin B-12 (CYANOCOBALAMIN) 1000 MCG tablet Take 1,000 mcg by mouth daily.     No current facility-administered medications on file prior to visit.     ROS see history of present illness  Objective  Physical Exam Vitals:   05/04/21 1114  BP: 120/80  Pulse: (!) 59  Temp: 98.3 F (36.8 C)  SpO2: 98%    BP Readings from Last 3 Encounters:  05/04/21 120/80  12/31/20 138/83  11/09/20 130/80   Wt Readings from Last 3 Encounters:  05/04/21 162 lb (73.5 kg)  12/31/20 163 lb 11.2 oz (74.3 kg)  11/09/20 163 lb (73.9 kg)    Physical Exam Constitutional:      General: She is not in acute distress.    Appearance: She is not diaphoretic.  Cardiovascular:     Rate and Rhythm: Normal rate and regular rhythm.     Heart sounds: Normal heart sounds.  Pulmonary:     Effort: Pulmonary effort is normal.     Breath sounds: Normal  breath sounds.  Musculoskeletal:     Right lower leg: No edema.     Left lower leg: No edema.     Comments: No midline spine tenderness, no midline spine step-off, no muscular back tenderness, 5/5 strength bilateral quads, hamstrings, plantar flexion, and dorsiflexion, sensation to light touch intact bilateral lower extremities  Skin:    General: Skin is warm and dry.  Neurological:     Mental Status: She is alert.     Assessment/Plan: Please see individual problem list.  Problem List Items Addressed This Visit     Chronic low back pain (Chronic)    Chronic ongoing issue.  Likely related to arthritis.  Given duration we will get an x-ray today.  She will monitor.      Relevant Orders   DG Lumbar Spine Complete   Hyperlipidemia (Chronic)    Check lipid panel.  I encouraged exercise.      Relevant Orders   Lipid panel    Hypertension - Primary (Chronic)    Well-controlled.  Continue acebutolol 200 mg daily.      Relevant Orders   Comp Met (CMET)   Hypothyroidism (Chronic)    Check TSH.  Continue Levoxyl 75 mcg once daily.      Relevant Orders   TSH   Venous insufficiency (Chronic)    I suspect lower extremity swelling is likely related to venous insufficiency.  She will elevate her legs.  She will monitor.  We will check labs to rule out other causes.      Other Visit Diagnoses     Bilateral lower extremity edema       Relevant Orders   CBC       Return in about 6 months (around 11/01/2021).  This visit occurred during the SARS-CoV-2 public health emergency.  Safety protocols were in place, including screening questions prior to the visit, additional usage of staff PPE, and extensive cleaning of exam room while observing appropriate contact time as indicated for disinfecting solutions.    Tommi Rumps, MD [place]

## 2021-05-04 NOTE — Patient Instructions (Addendum)
Nice to see you. We will get lab work today. We will get an x-ray today.

## 2021-05-04 NOTE — Assessment & Plan Note (Signed)
I suspect lower extremity swelling is likely related to venous insufficiency.  She will elevate her legs.  She will monitor.  We will check labs to rule out other causes.

## 2021-05-04 NOTE — Assessment & Plan Note (Signed)
Check lipid panel.  I encouraged exercise.

## 2021-05-04 NOTE — Assessment & Plan Note (Signed)
Chronic ongoing issue.  Likely related to arthritis.  Given duration we will get an x-ray today.  She will monitor.

## 2021-05-05 ENCOUNTER — Encounter: Payer: Self-pay | Admitting: Family Medicine

## 2021-05-05 ENCOUNTER — Other Ambulatory Visit: Payer: Self-pay | Admitting: Family Medicine

## 2021-05-05 DIAGNOSIS — E785 Hyperlipidemia, unspecified: Secondary | ICD-10-CM

## 2021-05-09 ENCOUNTER — Other Ambulatory Visit: Payer: Self-pay | Admitting: Family Medicine

## 2021-05-09 DIAGNOSIS — E785 Hyperlipidemia, unspecified: Secondary | ICD-10-CM

## 2021-05-12 ENCOUNTER — Telehealth: Payer: Self-pay | Admitting: Family Medicine

## 2021-05-12 DIAGNOSIS — J3081 Allergic rhinitis due to animal (cat) (dog) hair and dander: Secondary | ICD-10-CM | POA: Diagnosis not present

## 2021-05-12 DIAGNOSIS — J301 Allergic rhinitis due to pollen: Secondary | ICD-10-CM | POA: Diagnosis not present

## 2021-05-12 DIAGNOSIS — J3089 Other allergic rhinitis: Secondary | ICD-10-CM | POA: Diagnosis not present

## 2021-05-12 NOTE — Telephone Encounter (Signed)
Patient called in stating that [place] did not receive her lower back x-ray and she would like to know if she can come in to pick up a copy to take their office. Please advise.

## 2021-05-13 NOTE — Telephone Encounter (Signed)
Patient called in stating that [place] did not receive her lower back x-ray and she would like to know if she can come in to pick up a copy to take their office.  Miroslav Gin,cma

## 2021-05-13 NOTE — Telephone Encounter (Signed)
CD burned & placed up front for pick up. Pt aware  Note: This is also the first request I have received.

## 2021-05-16 NOTE — Telephone Encounter (Signed)
Pt came to pick up CD

## 2021-05-17 DIAGNOSIS — J301 Allergic rhinitis due to pollen: Secondary | ICD-10-CM | POA: Diagnosis not present

## 2021-05-17 DIAGNOSIS — J3089 Other allergic rhinitis: Secondary | ICD-10-CM | POA: Diagnosis not present

## 2021-05-17 DIAGNOSIS — J3081 Allergic rhinitis due to animal (cat) (dog) hair and dander: Secondary | ICD-10-CM | POA: Diagnosis not present

## 2021-05-19 ENCOUNTER — Other Ambulatory Visit: Payer: PPO

## 2021-05-19 ENCOUNTER — Ambulatory Visit: Payer: PPO

## 2021-05-19 ENCOUNTER — Telehealth: Payer: Self-pay

## 2021-05-19 NOTE — Telephone Encounter (Signed)
Unable to reach patient for scheduled AWV. No answer. Left message to call the office back and reschedule.

## 2021-05-20 ENCOUNTER — Ambulatory Visit: Payer: PPO

## 2021-05-24 DIAGNOSIS — J3089 Other allergic rhinitis: Secondary | ICD-10-CM | POA: Diagnosis not present

## 2021-05-24 DIAGNOSIS — J301 Allergic rhinitis due to pollen: Secondary | ICD-10-CM | POA: Diagnosis not present

## 2021-05-24 DIAGNOSIS — J3081 Allergic rhinitis due to animal (cat) (dog) hair and dander: Secondary | ICD-10-CM | POA: Diagnosis not present

## 2021-05-25 NOTE — Telephone Encounter (Signed)
Patient called in stating that Gerald Stabs at [facility] is unable to open up her disk for her x-ray and they would like to know what system to use.Please advise.

## 2021-05-26 ENCOUNTER — Other Ambulatory Visit: Payer: Self-pay | Admitting: *Deleted

## 2021-05-26 DIAGNOSIS — D0511 Intraductal carcinoma in situ of right breast: Secondary | ICD-10-CM

## 2021-05-26 MED ORDER — LETROZOLE 2.5 MG PO TABS: 2.5000 mg | ORAL_TABLET | Freq: Every day | ORAL | 0 refills | Status: DC

## 2021-05-26 NOTE — Telephone Encounter (Signed)
I called the patient and informed her that I spoke with Latoya who burned the CD and we have no way of telling her how to open the CD because we have never had anyone that had an issue with opening the CD's in the past and she understood.  Tamryn Popko,cma

## 2021-05-31 DIAGNOSIS — J301 Allergic rhinitis due to pollen: Secondary | ICD-10-CM | POA: Diagnosis not present

## 2021-05-31 DIAGNOSIS — J3089 Other allergic rhinitis: Secondary | ICD-10-CM | POA: Diagnosis not present

## 2021-05-31 DIAGNOSIS — J3081 Allergic rhinitis due to animal (cat) (dog) hair and dander: Secondary | ICD-10-CM | POA: Diagnosis not present

## 2021-06-03 ENCOUNTER — Other Ambulatory Visit: Payer: Self-pay

## 2021-06-03 ENCOUNTER — Other Ambulatory Visit (INDEPENDENT_AMBULATORY_CARE_PROVIDER_SITE_OTHER): Payer: PPO

## 2021-06-03 DIAGNOSIS — Z23 Encounter for immunization: Secondary | ICD-10-CM

## 2021-06-03 DIAGNOSIS — E785 Hyperlipidemia, unspecified: Secondary | ICD-10-CM | POA: Diagnosis not present

## 2021-06-03 LAB — LDL CHOLESTEROL, DIRECT: Direct LDL: 132 mg/dL

## 2021-06-06 LAB — PTH, INTACT AND CALCIUM
Calcium: 10 mg/dL (ref 8.6–10.4)
PTH: 38 pg/mL (ref 16–77)

## 2021-06-07 DIAGNOSIS — J3081 Allergic rhinitis due to animal (cat) (dog) hair and dander: Secondary | ICD-10-CM | POA: Diagnosis not present

## 2021-06-07 DIAGNOSIS — J301 Allergic rhinitis due to pollen: Secondary | ICD-10-CM | POA: Diagnosis not present

## 2021-06-07 DIAGNOSIS — J3089 Other allergic rhinitis: Secondary | ICD-10-CM | POA: Diagnosis not present

## 2021-06-08 DIAGNOSIS — J301 Allergic rhinitis due to pollen: Secondary | ICD-10-CM | POA: Diagnosis not present

## 2021-06-08 DIAGNOSIS — J3081 Allergic rhinitis due to animal (cat) (dog) hair and dander: Secondary | ICD-10-CM | POA: Diagnosis not present

## 2021-06-08 DIAGNOSIS — J3089 Other allergic rhinitis: Secondary | ICD-10-CM | POA: Diagnosis not present

## 2021-06-13 ENCOUNTER — Ambulatory Visit
Admission: RE | Admit: 2021-06-13 | Discharge: 2021-06-13 | Disposition: A | Discharge status: Home / Self Care | Payer: PPO | Source: Ambulatory Visit | Attending: Oncology | Admitting: Oncology

## 2021-06-13 ENCOUNTER — Other Ambulatory Visit: Payer: Self-pay

## 2021-06-13 ENCOUNTER — Other Ambulatory Visit: Payer: Self-pay | Admitting: Family Medicine

## 2021-06-13 DIAGNOSIS — D0511 Intraductal carcinoma in situ of right breast: Secondary | ICD-10-CM | POA: Diagnosis present

## 2021-06-13 DIAGNOSIS — Z1231 Encounter for screening mammogram for malignant neoplasm of breast: Secondary | ICD-10-CM | POA: Diagnosis not present

## 2021-06-13 DIAGNOSIS — I10 Essential (primary) hypertension: Secondary | ICD-10-CM

## 2021-06-13 NOTE — Telephone Encounter (Signed)
I called and LVM for patient to call back to schedule a lab recheck in 6-8 weeks from today.  Asante Blanda,cma

## 2021-06-14 ENCOUNTER — Other Ambulatory Visit: Payer: Self-pay

## 2021-06-14 ENCOUNTER — Ambulatory Visit
Admission: RE | Admit: 2021-06-14 | Discharge: 2021-06-14 | Disposition: A | Discharge status: Home / Self Care | Payer: PPO | Source: Ambulatory Visit | Attending: Oncology | Admitting: Oncology

## 2021-06-14 DIAGNOSIS — M810 Age-related osteoporosis without current pathological fracture: Secondary | ICD-10-CM | POA: Insufficient documentation

## 2021-06-14 DIAGNOSIS — I10 Essential (primary) hypertension: Secondary | ICD-10-CM

## 2021-06-14 DIAGNOSIS — E039 Hypothyroidism, unspecified: Secondary | ICD-10-CM

## 2021-06-14 DIAGNOSIS — M85851 Other specified disorders of bone density and structure, right thigh: Secondary | ICD-10-CM | POA: Diagnosis not present

## 2021-06-14 MED ORDER — LEVOXYL 75 MCG PO TABS: ORAL_TABLET | ORAL | 2 refills | Status: DC

## 2021-06-14 MED ORDER — ACEBUTOLOL HCL 200 MG PO CAPS: 200.0000 mg | ORAL_CAPSULE | Freq: Every day | ORAL | 2 refills | Status: DC

## 2021-06-16 NOTE — Telephone Encounter (Signed)
I called the patient and sheis scheduled for a lab appointment in 8 weeks.  Johnavon Mcclafferty,cma

## 2021-06-21 DIAGNOSIS — J3089 Other allergic rhinitis: Secondary | ICD-10-CM | POA: Diagnosis not present

## 2021-06-21 DIAGNOSIS — D0581 Other specified type of carcinoma in situ of right breast: Secondary | ICD-10-CM | POA: Diagnosis not present

## 2021-06-21 DIAGNOSIS — J301 Allergic rhinitis due to pollen: Secondary | ICD-10-CM | POA: Diagnosis not present

## 2021-06-21 DIAGNOSIS — J3081 Allergic rhinitis due to animal (cat) (dog) hair and dander: Secondary | ICD-10-CM | POA: Diagnosis not present

## 2021-06-28 ENCOUNTER — Encounter: Payer: Self-pay | Admitting: Family Medicine

## 2021-06-28 DIAGNOSIS — J3081 Allergic rhinitis due to animal (cat) (dog) hair and dander: Secondary | ICD-10-CM | POA: Diagnosis not present

## 2021-06-28 DIAGNOSIS — J3089 Other allergic rhinitis: Secondary | ICD-10-CM | POA: Diagnosis not present

## 2021-06-28 DIAGNOSIS — J301 Allergic rhinitis due to pollen: Secondary | ICD-10-CM | POA: Diagnosis not present

## 2021-06-30 NOTE — Progress Notes (Signed)
Gwendolyn King  [phone] [phone]  ID: Gwendolyn King OB: [birth year]  MR#: [id number]  WLN#:[id number]  Patient Care Team: Leone Haven, MD as PCP - General (Family Medicine) Minna Merritts, MD as Consulting Physician (Cardiology) Bary Castilla, Forest Gleason, MD (General Surgery)   CHIEF COMPLAINT: DCIS, right breast.  INTERVAL HISTORY: Patient returns to clinic today for routine 65-monthevaluation and continuation of Prolia.  She continues to feel well and remains asymptomatic.  She is tolerating letrozole without significant side effects.  She has no neurologic complaints.  She denies any recent fevers or illnesses.  She has a good appetite and denies weight loss. She denies any chest pain, shortness of breath, cough, or hemoptysis.  She denies any nausea, vomiting, constipation, or diarrhea.  She has no urinary complaints.  Patient offers no specific complaints today.  REVIEW OF SYSTEMS:   Review of Systems  Constitutional: Negative.  Negative for diaphoresis, fever, malaise/fatigue and weight loss.  Respiratory: Negative.  Negative for cough and shortness of breath.   Cardiovascular: Negative.  Negative for chest pain and leg swelling.  Gastrointestinal: Negative.  Negative for abdominal pain and constipation.  Genitourinary: Negative.  Negative for dysuria.  Musculoskeletal: Negative.  Negative for back pain.  Skin: Negative.  Negative for itching and rash.  Neurological: Negative.  Negative for sensory change, focal weakness and weakness.  Psychiatric/Behavioral: Negative.  The patient is not nervous/anxious.    As per HPI. Otherwise, a complete review of systems is negative.  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   Cataract    Bil   Chicken pox    Chronic kidney disease    stage 3   Colon polyp    Constipation    Hyperlipidemia    Hypothyroidism    Palpitations    over 20 yeras ago   Personal history of radiation therapy    Thyroid disease     PAST SURGICAL HISTORY: Past Surgical History:  Procedure Laterality Date   ABDOMINAL HYSTERECTOMY  1981   APPENDECTOMY  2012   BREAST BIOPSY Right 05/08/2017   Affirm Bx-DUCTAL CARCINOMA IN SITU (DCIS) WITH ONE FOCUS SUSPICIOUS FOR invasive   BREAST BIOPSY Right 05/17/2017   uKoreabx done at DR. 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 2 microscopic foci lateral inferior margins. Patient elected not to proceed to reexcision.   COLONOSCOPY     COLONOSCOPY W/ POLYPECTOMY     CYSTOCELE REPAIR N/A 09/11/2019   Procedure: ANTERIOR COLPORRHAPHY;  Surgeon: HGae Dry MD;  Location: [facility] OR[facility];  Service: Gynecology;  Laterality: N/A;   FINGER FRACTURE SURGERY Right    5th   HAMMER TOE SURGERY  09/20/2017   JOINT REPLACEMENT  right knee   ROTATOR CUFF REPAIR Right    TONSILLECTOMY AND ADENOIDECTOMY  1963   TOTAL KNEE ARTHROPLASTY Right 10/17/2016   Procedure: TOTAL KNEE ARTHROPLASTY;  Surgeon: Melrose Nakayama, MD;  Location: [place];  Service: Orthopedics;  Laterality: Right;   VAGINAL DELIVERY     3    FAMILY HISTORY: Family History  Problem Relation Age of Onset   Arthritis Mother    Heart disease Mother    Hypertension Mother    Arthritis  Father    Heart disease Father    Hypertension Father    Heart attack Father    Stroke Maternal Grandmother    Cancer Maternal Aunt        abdominal?   Hyperlipidemia Maternal Aunt    Diabetes Paternal Aunt    Glaucoma Brother    Hyperlipidemia Other    Diabetes Other    Breast cancer Neg Hx     ADVANCED DIRECTIVES (Y/N):  N  HEALTH MAINTENANCE: Social History   Tobacco Use   Smoking status: Never   Smokeless tobacco: Never  Vaping Use   Vaping Use: Never used  Substance Use Topics   Alcohol use: Yes    Comment: wine occ   Drug use: No     Colonoscopy:  PAP:  Bone density:  Lipid panel:  Allergies  Allergen Reactions   Singulair [Montelukast] Itching   Lipitor [Atorvastatin] Other (See Comments)    Myalgia, arthralgia   Simvastatin Other (See Comments)    myalgia    Current Outpatient Medications  Medication Sig Dispense Refill   acebutolol (SECTRAL) 200 MG capsule Take 1 capsule (200 mg total) by mouth daily. 90 capsule 2   acetaminophen (TYLENOL) 500 MG tablet Take 1,000 mg by mouth at bedtime.     aspirin EC 81 MG tablet Take 81 mg by mouth at bedtime.      AZELASTINE HCL OP Place 1 drop into both eyes 3 (three) times daily as needed (dry/irritated eyes.).     Biotin 5 MG TABS Take 5 mg by mouth daily.     calcium-vitamin D (OSCAL WITH D) 500-200 MG-UNIT tablet Take 1 tablet by mouth daily.      cholecalciferol (VITAMIN D) 25 MCG (1000 UNIT) tablet Take 1,000 Units by mouth every evening.     EPINEPHrine 0.3 mg/0.3 mL IJ SOAJ injection Inject 0.3 mg into the muscle as needed for anaphylaxis.     ezetimibe (ZETIA) 10 MG tablet      Krill Oil 500 MG CAPS Take 500 mg by mouth daily.     letrozole (FEMARA) 2.5 MG tablet Take 1 tablet (2.5 mg total) by mouth daily. 90 tablet 0   levocetirizine (XYZAL) 5 MG tablet Take 5 mg by mouth every evening.   3   LEVOXYL 75 MCG tablet Take 1 tablet by mouth once daily 90 tablet 2   Multiple Vitamin (MULTIVITAMIN WITH  MINERALS) TABS tablet Take 1 tablet by mouth daily. Adult 50+     polyethylene glycol powder (GLYCOLAX/MIRALAX) powder Take 17 g by mouth daily. With coffee     vitamin B-12 (CYANOCOBALAMIN) 1000 MCG tablet Take 1,000 mcg by mouth daily.     No current facility-administered medications for this visit.    OBJECTIVE: Vitals:   07/05/21 1052  BP: (!) 152/73  Pulse: (!) 58  Resp: 17  Temp: (!) 96 F (35.6 C)  SpO2: 99%     Body mass index is 31.05 kg/m.    ECOG FS:0 -  Asymptomatic  General: Well-developed, well-nourished, no acute distress. Eyes: Pink conjunctiva, anicteric sclera. HEENT: Normocephalic, moist mucous membranes. Breast: Exam deferred today. Lungs: No audible wheezing or coughing. Heart: Regular rate and rhythm. Abdomen: Soft, nontender, no obvious distention. Musculoskeletal: No edema, cyanosis, or clubbing. Neuro: Alert, answering all questions appropriately. Cranial nerves grossly intact. Skin: No rashes or petechiae noted. Psych: Normal affect.   LAB RESULTS:  Lab Results  Component Value Date   NA 140 07/05/2021   K 4.3 07/05/2021   CL 101 07/05/2021   CO2 29 07/05/2021   GLUCOSE 86 07/05/2021   BUN 21 07/05/2021   CREATININE 1.09 (H) 07/05/2021   CALCIUM 9.6 07/05/2021   PROT 6.8 07/05/2021   ALBUMIN 4.1 07/05/2021   AST 21 07/05/2021   ALT 17 07/05/2021   ALKPHOS 37 (L) 07/05/2021   BILITOT 0.8 07/05/2021   GFRNONAA 52 (L) 07/05/2021   GFRAA 49 (L) 06/01/2017    Lab Results  Component Value Date   WBC 4.9 05/04/2021   NEUTROABS 2.6 06/01/2017   HGB 14.5 05/04/2021   HCT 42.8 05/04/2021   MCV 86.9 05/04/2021   PLT 122.0 (L) 05/04/2021     STUDIES: DG Bone Density  Result Date: 06/14/2021 EXAM: DUAL X-RAY ABSORPTIOMETRY (DXA) FOR BONE MINERAL DENSITY IMPRESSION: Your patient Gwendolyn King completed a BMD test on 06/14/2021 using the [place] (software version: 14.10) manufactured by [employer]. The following  summarizes the results of our evaluation. Technologist: crr PATIENT BIOGRAPHICAL: Name: Gwendolyn King, Gwendolyn King Patient ID: [id number] Birth Date: [birth year] Height: 60.5 in. Gender: Female Exam Date: 06/14/2021 Weight: 159.1 lbs. Indications: Advanced Age, Caucasian, Height Loss, High Risk Meds, History of Breast Cancer, History of Radiation, Hypothyroid, Hysterectomy, Osteoporotic, Postmenopausal, Right knee replacement Fractures: Treatments: aspirin 81 mg, calcium w/ vit D, Letrozole, LEVOXYL, Vitamin D DENSITOMETRY RESULTS: Site         Region     Measured Date Measured Age WHO Classification Young Adult T-score BMD         %Change vs. Previous Significant Change (*) DualFemur Neck Right 06/14/2023 78.4 Osteopenia -1.4 0.847 g/cm2 0.5% - DualFemur Neck Right 06/10/2020 77.4 Osteopenia -1.4 0.843 g/cm2 2.4% - DualFemur Neck Right 06/10/2019 76.4 Osteopenia -1.5 0.823 g/cm2 0.7% - DualFemur Neck Right 06/06/2018 75.3 Osteopenia -1.6 0.817 g/cm2 -0.5% - DualFemur Neck Right 05/17/2016 73.3 Osteopenia -1.6 0.821 g/cm2 - - DualFemur Total Mean 06/14/2021 78.4 Normal -0.8 0.909 g/cm2 2.0% - DualFemur Total Mean 06/10/2020 77.4 Normal -0.9 0.891 g/cm2 -0.2% - DualFemur Total Mean 06/10/2019 76.4 Normal -0.9 0.893 g/cm2 0.3% - DualFemur Total Mean 06/06/2018 75.3 Normal -0.9 0.890 g/cm2 -1.4% - DualFemur Total Mean 05/17/2016 73.3 Normal -0.8 0.903 g/cm2 - - Left Forearm Radius 33% 06/14/2021 78.4 Osteoporosis -2.7 0.643 g/cm2 6.3% Yes Left Forearm Radius 33% 06/10/2020 77.4 Osteoporosis -3.1 0.605 g/cm2 -5.9% Yes Left Forearm Radius 33% 06/10/2019 76.4 Osteoporosis -2.7 0.643 g/cm2 -3.5% - Left Forearm Radius 33% 06/06/2018 75.3 Osteopenia -2.4 0.666 g/cm2 -5.1% - Left Forearm Radius 33% 05/17/2016 73.3 Osteopenia -2.0 0.702 g/cm2 - - ASSESSMENT: The BMD measured at Forearm Radius 33% is 0.643 g/cm2 with a T-score of -2.7. This patient is considered osteoporotic according to [facility]) criteria. Lumbar spine  was not utilized due to advanced degenerative changes. The scan quality is limited by exclusion of L-spine. World Health Organization [facility]) criteria for post-menopausal, Caucasian Women: Normal:  T-score at or above -1 SD Osteopenia/low bone mass: T-score between -1 and -2.5 SD Osteoporosis:             T-score at or below -2.5 SD RECOMMENDATIONS: 1. All patients should optimize calcium and vitamin D intake. 2. Consider FDA-approved medical therapies in postmenopausal women and men aged 86 years and older, based on the following: a. A hip or vertebral(clinical or morphometric) fracture b. T-score < -2.5 at the femoral neck or spine after appropriate evaluation to exclude secondary causes c. Low bone mass (T-score between -1.0 and -2.5 at the femoral neck or spine) and a 10-year probability of a hip fracture > 3% or a 10-year probability of a major osteoporosis-related fracture > 20% based on the [place]-adapted WHO algorithm 3. Clinician judgment and/or patient preferences may indicate treatment for people with 10-year fracture probabilities above or below these levels FOLLOW-UP: People with diagnosed cases of osteoporosis or at high risk for fracture should have regular bone mineral density tests. For patients eligible for Medicare, routine testing is allowed once every 2 years. The testing frequency can be increased to one year for patients who have rapidly progressing disease, those who are receiving or discontinuing medical therapy to restore bone mass, or have additional risk factors. I have reviewed this report, and agree with the above findings. [facility] Electronically Signed   By: Rolm Baptise M.D.   On: 06/14/2021 21:28   MM 3D SCREEN BREAST BILATERAL  Result Date: 06/15/2021 CLINICAL DATA:  Screening. Personal history of malignant lumpectomy of the RIGHT breast in 2018 with adjuvant radiation therapy. Benign core needle biopsy of dystrophic calcifications at the  lumpectomy site in 2020. EXAM: DIGITAL SCREENING BILATERAL MAMMOGRAM WITH TOMOSYNTHESIS AND CAD TECHNIQUE: Bilateral screening digital craniocaudal and mediolateral oblique mammograms were obtained. Bilateral screening digital breast tomosynthesis was performed. The images were evaluated with computer-aided detection. COMPARISON:  Previous exam(s). ACR Breast Density Category b: There are scattered areas of fibroglandular density. FINDINGS: There are no findings suspicious for malignancy. Post lumpectomy changes involving the RIGHT breast with associated dystrophic calcifications. IMPRESSION: No mammographic evidence of malignancy. A result letter of this screening mammogram will be mailed directly to the patient. RECOMMENDATION: Screening mammogram in one year. (Code:SM-B-01Y) BI-RADS CATEGORY  2: Benign. Electronically Signed   By: Evangeline Dakin M.D.   On: 06/15/2021 15:19    ASSESSMENT: DCIS, right breast  PLAN:    1. DCIS, right breast: Patient underwent lumpectomy on June 07, 2017.  Final pathology was reviewed and no invasive component was noted, therefore patient did not require chemotherapy.  She completed adjuvant XRT in January 2019.  Because of patient's persistent hot flashes, tamoxifen was discontinued and patient was initiated on letrozole.  Patient expressed understanding that although tamoxifen is the recommended treatment, there is still significant benefit with aromatase inhibitors. Continue treatment for a total of 5 years completing in January 2024.   Her most recent mammogram on June 13, 2021 was reported as BI-RADS 2, repeat in October 2023.  Return to clinic in 6 months for routine evaluation.   2.  Thrombocytopenia: Chronic and unchanged. Her most recent platelet count was 121. 3.  Osteoporosis: Patient's most recent bone mineral density on June 14, 2021 was reported -2.7 which is significantly improved from 1 year prior where her T score was reported -3.1.  Patient did  not receive benefit from Fosamax and was switched to Prolia.  Her calcium levels are 9.6 and adequate to proceed with treatment.  Continue calcium and vitamin D supplementation.  Repeat bone mineral density in October 2023.  Return to clinic in 6 months for repeat laboratory work and continuation of treatment.     I spent a total of 30 minutes reviewing chart data, face-to-face evaluation with the patient, counseling and coordination of care as detailed above.   Patient expressed understanding and was in agreement with this plan. She also understands that She can call clinic at any time with any questions, concerns, or complaints.   Cancer Staging Ductal carcinoma in situ (DCIS) of right breast Staging form: Breast, AJCC 8th Edition - Clinical stage from 08/22/2017: Stage 0 (cTis (DCIS), cN0, cM0, ER+, PR-, HER2-) - Signed by Lloyd Huger, MD on 08/22/2017 Laterality: Right   Lloyd Huger, MD   07/05/2021 1:54 PM

## 2021-07-04 ENCOUNTER — Other Ambulatory Visit: Payer: Self-pay | Admitting: Emergency Medicine

## 2021-07-04 DIAGNOSIS — D0511 Intraductal carcinoma in situ of right breast: Secondary | ICD-10-CM

## 2021-07-05 ENCOUNTER — Inpatient Hospital Stay (HOSPITAL_BASED_OUTPATIENT_CLINIC_OR_DEPARTMENT_OTHER): Payer: PPO

## 2021-07-05 ENCOUNTER — Other Ambulatory Visit: Payer: Self-pay

## 2021-07-05 ENCOUNTER — Inpatient Hospital Stay: Payer: PPO | Admitting: Oncology

## 2021-07-05 ENCOUNTER — Encounter: Payer: Self-pay | Admitting: Oncology

## 2021-07-05 ENCOUNTER — Inpatient Hospital Stay: Payer: PPO | Attending: Oncology

## 2021-07-05 VITALS — BP 152/73 | HR 58 | Temp 96.0°F | Resp 17 | Wt 159.0 lb

## 2021-07-05 DIAGNOSIS — M810 Age-related osteoporosis without current pathological fracture: Secondary | ICD-10-CM | POA: Diagnosis present

## 2021-07-05 DIAGNOSIS — D0511 Intraductal carcinoma in situ of right breast: Secondary | ICD-10-CM

## 2021-07-05 LAB — COMPREHENSIVE METABOLIC PANEL WITH GFR
ALT: 17 U/L (ref 0–44)
AST: 21 U/L (ref 15–41)
Albumin: 4.1 g/dL (ref 3.5–5.0)
Alkaline Phosphatase: 37 U/L — ABNORMAL LOW (ref 38–126)
Anion gap: 10 (ref 5–15)
BUN: 21 mg/dL (ref 8–23)
CO2: 29 mmol/L (ref 22–32)
Calcium: 9.6 mg/dL (ref 8.9–10.3)
Chloride: 101 mmol/L (ref 98–111)
Creatinine, Ser: 1.09 mg/dL — ABNORMAL HIGH (ref 0.44–1.00)
GFR, Estimated: 52 mL/min — ABNORMAL LOW
Glucose, Bld: 86 mg/dL (ref 70–99)
Potassium: 4.3 mmol/L (ref 3.5–5.1)
Sodium: 140 mmol/L (ref 135–145)
Total Bilirubin: 0.8 mg/dL (ref 0.3–1.2)
Total Protein: 6.8 g/dL (ref 6.5–8.1)

## 2021-07-05 MED ORDER — DENOSUMAB 60 MG/ML ~~LOC~~ SOSY
60.0000 mg | PREFILLED_SYRINGE | Freq: Once | SUBCUTANEOUS | Status: AC
  Administered 2021-07-05: 60 mg via SUBCUTANEOUS
  Filled 2021-07-05: qty 1

## 2021-07-05 NOTE — Progress Notes (Signed)
No new concerns today 

## 2021-07-07 DIAGNOSIS — J301 Allergic rhinitis due to pollen: Secondary | ICD-10-CM | POA: Diagnosis not present

## 2021-07-07 DIAGNOSIS — J3081 Allergic rhinitis due to animal (cat) (dog) hair and dander: Secondary | ICD-10-CM | POA: Diagnosis not present

## 2021-07-07 DIAGNOSIS — J3089 Other allergic rhinitis: Secondary | ICD-10-CM | POA: Diagnosis not present

## 2021-07-12 ENCOUNTER — Ambulatory Visit: Payer: PPO

## 2021-07-12 DIAGNOSIS — J3089 Other allergic rhinitis: Secondary | ICD-10-CM | POA: Diagnosis not present

## 2021-07-12 DIAGNOSIS — J301 Allergic rhinitis due to pollen: Secondary | ICD-10-CM | POA: Diagnosis not present

## 2021-07-12 DIAGNOSIS — J3081 Allergic rhinitis due to animal (cat) (dog) hair and dander: Secondary | ICD-10-CM | POA: Diagnosis not present

## 2021-07-19 DIAGNOSIS — J3089 Other allergic rhinitis: Secondary | ICD-10-CM | POA: Diagnosis not present

## 2021-07-19 DIAGNOSIS — J301 Allergic rhinitis due to pollen: Secondary | ICD-10-CM | POA: Diagnosis not present

## 2021-07-19 DIAGNOSIS — J3081 Allergic rhinitis due to animal (cat) (dog) hair and dander: Secondary | ICD-10-CM | POA: Diagnosis not present

## 2021-07-26 DIAGNOSIS — J3081 Allergic rhinitis due to animal (cat) (dog) hair and dander: Secondary | ICD-10-CM | POA: Diagnosis not present

## 2021-07-26 DIAGNOSIS — J3089 Other allergic rhinitis: Secondary | ICD-10-CM | POA: Diagnosis not present

## 2021-07-26 DIAGNOSIS — J301 Allergic rhinitis due to pollen: Secondary | ICD-10-CM | POA: Diagnosis not present

## 2021-08-02 DIAGNOSIS — J3089 Other allergic rhinitis: Secondary | ICD-10-CM | POA: Diagnosis not present

## 2021-08-02 DIAGNOSIS — J301 Allergic rhinitis due to pollen: Secondary | ICD-10-CM | POA: Diagnosis not present

## 2021-08-02 DIAGNOSIS — J3081 Allergic rhinitis due to animal (cat) (dog) hair and dander: Secondary | ICD-10-CM | POA: Diagnosis not present

## 2021-08-08 ENCOUNTER — Other Ambulatory Visit (INDEPENDENT_AMBULATORY_CARE_PROVIDER_SITE_OTHER): Payer: PPO

## 2021-08-08 ENCOUNTER — Other Ambulatory Visit: Payer: Self-pay

## 2021-08-08 DIAGNOSIS — E785 Hyperlipidemia, unspecified: Secondary | ICD-10-CM

## 2021-08-08 LAB — LDL CHOLESTEROL, DIRECT: Direct LDL: 127 mg/dL

## 2021-08-11 DIAGNOSIS — J3081 Allergic rhinitis due to animal (cat) (dog) hair and dander: Secondary | ICD-10-CM | POA: Diagnosis not present

## 2021-08-11 DIAGNOSIS — J301 Allergic rhinitis due to pollen: Secondary | ICD-10-CM | POA: Diagnosis not present

## 2021-08-11 DIAGNOSIS — J3089 Other allergic rhinitis: Secondary | ICD-10-CM | POA: Diagnosis not present

## 2021-08-18 DIAGNOSIS — J3081 Allergic rhinitis due to animal (cat) (dog) hair and dander: Secondary | ICD-10-CM | POA: Diagnosis not present

## 2021-08-18 DIAGNOSIS — J301 Allergic rhinitis due to pollen: Secondary | ICD-10-CM | POA: Diagnosis not present

## 2021-08-18 DIAGNOSIS — J3089 Other allergic rhinitis: Secondary | ICD-10-CM | POA: Diagnosis not present

## 2021-08-23 DIAGNOSIS — J3081 Allergic rhinitis due to animal (cat) (dog) hair and dander: Secondary | ICD-10-CM | POA: Diagnosis not present

## 2021-08-23 DIAGNOSIS — J301 Allergic rhinitis due to pollen: Secondary | ICD-10-CM | POA: Diagnosis not present

## 2021-08-23 DIAGNOSIS — J3089 Other allergic rhinitis: Secondary | ICD-10-CM | POA: Diagnosis not present

## 2021-08-27 ENCOUNTER — Encounter: Payer: Self-pay | Admitting: Oncology

## 2021-08-30 DIAGNOSIS — J301 Allergic rhinitis due to pollen: Secondary | ICD-10-CM | POA: Diagnosis not present

## 2021-08-30 DIAGNOSIS — H1045 Other chronic allergic conjunctivitis: Secondary | ICD-10-CM | POA: Diagnosis not present

## 2021-08-30 DIAGNOSIS — J3089 Other allergic rhinitis: Secondary | ICD-10-CM | POA: Diagnosis not present

## 2021-08-30 DIAGNOSIS — J3081 Allergic rhinitis due to animal (cat) (dog) hair and dander: Secondary | ICD-10-CM | POA: Diagnosis not present

## 2021-08-31 ENCOUNTER — Telehealth: Payer: Self-pay

## 2021-08-31 ENCOUNTER — Ambulatory Visit (INDEPENDENT_AMBULATORY_CARE_PROVIDER_SITE_OTHER): Payer: PPO

## 2021-08-31 VITALS — Ht 60.05 in | Wt 159.0 lb

## 2021-08-31 DIAGNOSIS — Z0000 Encounter for general adult medical examination without abnormal findings: Secondary | ICD-10-CM

## 2021-08-31 NOTE — Patient Instructions (Addendum)
°  Gwendolyn King , Thank you for taking time to come for your Medicare Wellness Visit. I appreciate your ongoing commitment to your health goals. Please review the following plan we discussed and let me know if I can assist you in the future.   These are the goals we discussed:  Goals       Patient Stated     Maintain Healthy Lifestyle (pt-stated)      Stay active Healthy diet        This is a list of the screening recommended for you and due dates:  Health Maintenance  Topic Date Due   Zoster (Shingles) Vaccine (2 of 2) 11/29/2021*   Tetanus Vaccine  08/31/2022*   Colon Cancer Screening  03/21/2023   Pneumonia Vaccine  Completed   Flu Shot  Completed   DEXA scan (bone density measurement)  Completed   COVID-19 Vaccine  Completed   HPV Vaccine  Aged Out   Hepatitis C Screening: USPSTF Recommendation to screen - Ages 58-79 yo.  Discontinued  *Topic was postponed. The date shown is not the original due date.

## 2021-08-31 NOTE — Progress Notes (Signed)
Subjective:   Gwendolyn King is a 81 y.o. female who presents for Medicare Annual (Subsequent) preventive examination.  Review of Systems    No ROS.  Medicare Wellness Virtual Visit.  Visual/audio telehealth visit, UTA vital signs.   See social history for additional risk factors.   Cardiac Risk Factors include: advanced age (>55men, >51 women);hypertension     Objective:    Today's Vitals   08/31/21 1459  Weight: 159 lb (72.1 kg)  Height: 5' 0.05" (1.525 m)   Body mass index is 31 kg/m.  Advanced Directives 08/31/2021 07/05/2021 10/12/2020 07/02/2020 06/14/2020 05/18/2020 12/12/2019  Does Patient Have a Medical Advance Directive? Yes Yes No No No No No  Type of Paramedic of [place];Living will [place];Living will - - - - -  Does patient want to make changes to medical advance directive? No - Patient declined - - - - - -  Copy of [place] in Chart? No - copy requested - - - - - -  Would patient like information on creating a medical advance directive? - - No - Patient declined No - Patient declined No - Patient declined - No - Patient declined    Current Medications (verified) Outpatient Encounter Medications as of 08/31/2021  Medication Sig   acebutolol (SECTRAL) 200 MG capsule Take 1 capsule (200 mg total) by mouth daily.   acetaminophen (TYLENOL) 500 MG tablet Take 1,000 mg by mouth at bedtime.   aspirin EC 81 MG tablet Take 81 mg by mouth at bedtime.    AZELASTINE HCL OP Place 1 drop into both eyes 3 (three) times daily as needed (dry/irritated eyes.).   Biotin 5 MG TABS Take 5 mg by mouth daily.   calcium-vitamin D (OSCAL WITH D) 500-200 MG-UNIT tablet Take 1 tablet by mouth daily.    cholecalciferol (VITAMIN D) 25 MCG (1000 UNIT) tablet Take 1,000 Units by mouth every evening.   EPINEPHrine 0.3 mg/0.3 mL IJ SOAJ injection Inject 0.3 mg into the muscle as needed for anaphylaxis.   ezetimibe (ZETIA) 10 MG  tablet    Krill Oil 500 MG CAPS Take 500 mg by mouth daily.   letrozole (FEMARA) 2.5 MG tablet Take 1 tablet (2.5 mg total) by mouth daily.   levocetirizine (XYZAL) 5 MG tablet Take 5 mg by mouth every evening.    LEVOXYL 75 MCG tablet Take 1 tablet by mouth once daily   Multiple Vitamin (MULTIVITAMIN WITH MINERALS) TABS tablet Take 1 tablet by mouth daily. Adult 50+   polyethylene glycol powder (GLYCOLAX/MIRALAX) powder Take 17 g by mouth daily. With coffee   vitamin B-12 (CYANOCOBALAMIN) 1000 MCG tablet Take 1,000 mcg by mouth daily.   No facility-administered encounter medications on file as of 08/31/2021.    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   Cataract    Bil   Chicken pox    Chronic kidney disease    stage 3   Colon polyp    Constipation    Hyperlipidemia    Hypothyroidism    Palpitations    over 20 yeras ago   Personal history of radiation  therapy    Thyroid disease    Past Surgical History:  Procedure Laterality Date   ABDOMINAL HYSTERECTOMY  1981   APPENDECTOMY  2012   BREAST BIOPSY Right 05/08/2017   Affirm Bx-DUCTAL CARCINOMA IN SITU (DCIS) WITH ONE FOCUS SUSPICIOUS FOR invasive   BREAST BIOPSY Right 05/17/2017   [place] bx done at DR. Brynetts office, papillary carcinoma   BREAST BIOPSY Right 06/18/2019   Stereo bc calcs COMPATIBLE WITH PRIOR SURGICAL SITE CHANGES   BREAST LUMPECTOMY Right 06/07/2017   DUCTAL CARCINOMA IN SITU (DCIS) with microinvasion at 7:00 5 cmfn   BREAST LUMPECTOMY Right 06/07/2017   PAPILLARY LESION CONSISTENT WITH PAPILLARY CARCINOMA at retroaerolar 7:00 1.5 cmfn   BREAST LUMPECTOMY WITH SENTINEL LYMPH NODE BIOPSY  Right 06/07/2017   Wide excision of 2 foci of high-grade DCIS, microinvasion noted only on core biopsy. 2 microscopic foci lateral inferior margins. Patient elected not to proceed to reexcision.   COLONOSCOPY     COLONOSCOPY W/ POLYPECTOMY     CYSTOCELE REPAIR N/A 09/11/2019   Procedure: ANTERIOR COLPORRHAPHY;  Surgeon: Nadara Mustard, MD;  Location: [facility] ORS;  Service: Gynecology;  Laterality: N/A;   FINGER FRACTURE SURGERY Right    5th   HAMMER TOE SURGERY  09/20/2017   JOINT REPLACEMENT     right knee   ROTATOR CUFF REPAIR Right    TONSILLECTOMY AND ADENOIDECTOMY  1963   TOTAL KNEE ARTHROPLASTY Right 10/17/2016   Procedure: TOTAL KNEE ARTHROPLASTY;  Surgeon: Marcene Corning, MD;  Location: [facility];  Service: Orthopedics;  Laterality: Right;   VAGINAL DELIVERY     3   Family History  Problem Relation Age of Onset   Arthritis Mother    Heart disease Mother    Hypertension Mother    Arthritis Father    Heart disease Father    Hypertension Father    Heart attack Father    Stroke Maternal Grandmother    Cancer Maternal Aunt        abdominal?   Hyperlipidemia Maternal Aunt    Diabetes Paternal Aunt    Glaucoma Brother    Hyperlipidemia Other    Diabetes Other    Breast cancer Neg Hx    Social History   Socioeconomic History   Marital status: Married    Spouse name: Not on file   Number of children: Not on file   Years of education: Not on file   Highest education level: Not on file  Occupational History   Not on file  Tobacco Use   Smoking status: Never   Smokeless tobacco: Never  Vaping Use   Vaping Use: Never used  Substance and Sexual Activity   Alcohol use: Yes    Comment: wine occ   Drug use: No   Sexual activity: Not Currently    Birth control/protection: Post-menopausal  Other Topics Concern   Not on file  Social History Narrative   Lives with husband in [place]. 3 children.      Work - retired, Radio producer, Runner, broadcasting/film/video      Diet - regular, limits  sugar, eats small meals   Exercise - Jazzercise 4 days per week   Social Determinants of Corporate investment banker Strain: Low Risk    Difficulty of Paying Living Expenses: Not hard at all  Food Insecurity: No Food Insecurity   Worried About Programme researcher, broadcasting/film/video in the Last Year: Never true   [employer] of Food in the Last Year: Never true  Transportation Needs: No Data processing manager (Medical): No   Lack of Transportation (Non-Medical): No  Physical Activity: Not on file  Stress: No Stress Concern Present   Feeling of Stress : Not at all  Social Connections: Socially Integrated   Frequency of Communication with Friends and Family: More than three times a week   Frequency of Social Gatherings with Friends and Family: Not on file   Attends Religious Services: More than 4 times per year   Active Member of [employer] or Organizations: Yes   Attends Archivist Meetings: Not on file   Marital Status: Married    Tobacco Counseling Counseling given: Not Answered   Clinical Intake:  Pre-visit preparation completed: Yes        Diabetes: No  How often do you need to have someone help you when you read instructions, pamphlets, or other written materials from your doctor or pharmacy?: 1 - Never    Interpreter Needed?: No      Activities of Daily Living In your present state of health, do you have any difficulty performing the following activities: 08/31/2021  Hearing? Y  Comment Hearing aids  Vision? N  Difficulty concentrating or making decisions? N  Walking or climbing stairs? N  Dressing or bathing? N  Doing errands, shopping? N  Preparing Food and eating ? N  Using the Toilet? N  In the past six months, have you accidently leaked urine? N  Do you have problems with loss of bowel control? N  Managing your Medications? N  Managing your Finances? N  Housekeeping or managing your Housekeeping? N  Some recent data might be hidden     Patient Care Team: Leone Haven, MD as PCP - General (Family Medicine) Rockey Situ Kathlene November, MD as Consulting Physician (Cardiology) Bary Castilla Forest Gleason, MD (General Surgery)  Indicate any recent Medical Services you may have received from other than [facility] providers in the past year (date may be approximate).     Assessment:   This is a routine wellness examination for Gwendolyn King.  Virtual Visit via Telephone Note  I connected with  Gwendolyn King on 08/31/21 at  2:45 PM EST by telephone and verified that I am speaking with the correct person using two identifiers.  Persons participating in the virtual visit: patient/Nurse Health Advisor   I discussed the limitations, risks, security and privacy concerns of performing an evaluation and management service by telephone and the availability of in person appointments. The patient expressed understanding and agreed to proceed.  Interactive audio and video telecommunications were attempted between this nurse and patient, however failed, due to patient having technical difficulties OR patient did not have access to video capability.  We continued and completed visit with audio only.  Some vital signs may be absent or patient reported.   Hearing/Vision screen Hearing Screening - Comments:: Hearing aids Vision Screening - Comments:: Followed by [facility] Wears corrective lenses  They have regular follow up with the ophthalmologist  Dietary issues and exercise activities discussed: Current Exercise Habits: Home exercise routine, Intensity: Mild Healthy diet   Goals Addressed               This Visit's Progress     Patient Stated     Maintain Healthy Lifestyle (pt-stated)        Stay active Healthy diet       Depression Screen [facility] 2/9 Scores 08/31/2021 05/04/2021 05/18/2020 05/03/2020 01/27/2020 01/27/2020 09/08/2019  PHQ -  2 Score 0 0 0 0 0 0 0    Fall Risk Fall Risk  08/31/2021 05/04/2021 05/18/2020 05/03/2020 01/27/2020   Falls in the past year? 0 0 0 0 0  Number falls in past yr: 0 0 0 0 0  Injury with Fall? - - - - -  Follow up Falls evaluation completed Falls evaluation completed Falls evaluation completed Falls evaluation completed Falls evaluation completed   FALL RISK PREVENTION PERTAINING TO THE HOME: Home free of loose throw rugs in walkways, pet beds, electrical cords, etc? Yes  Adequate lighting in your home to reduce risk of falls? Yes   ASSISTIVE DEVICES UTILIZED TO PREVENT FALLS: Life alert? No  Use of a cane, walker or w/c? No   TIMED UP AND GO: Was the test performed? No .   Cognitive Function: Patient is alert and oriented x3.   MMSE - Mini Mental State Exam 04/18/2018  Orientation to time 5  Orientation to Place 5  Registration 3  Attention/ Calculation 5  Recall 3  Language- name 2 objects 2  Language- repeat 1  Language- follow 3 step command 3  Language- read & follow direction 1  Write a sentence 1  Copy design 1  Total score 30     6CIT Screen 05/16/2019  What Year? 0 points  What month? 0 points  What time? 0 points  Count back from 20 0 points  Months in reverse 0 points  Repeat phrase 0 points  Total Score 0    Immunizations Immunization History  Administered Date(s) Administered   Fluad Quad(high Dose 65+) 05/13/2019, 06/21/2020, 06/03/2021   Influenza Split 05/01/2012   Influenza, High Dose Seasonal PF 05/22/2016, 05/11/2017, 05/22/2018, 07/08/2019, 06/22/2020   Influenza,inj,Quad PF,6+ Mos 05/18/2014   Influenza-Unspecified 05/23/2013   PFIZER Comirnaty(Gray Top)Covid-19 Tri-Sucrose Vaccine 09/02/2020, 01/11/2021   PFIZER(Purple Top)SARS-COV-2 Vaccination 10/02/2019, 10/28/2019, 06/22/2020   Pfizer Covid-19 Vaccine Bivalent Booster 27yrs & up 06/15/2021   Pneumococcal Conjugate-13 08/21/2010   Pneumococcal Polysaccharide-23 04/10/2014, 05/09/2018, 07/08/2019, 06/22/2020   Tdap 08/21/2010   Zoster Recombinat (Shingrix) 04/04/2019   Zoster, Live  07/05/2008   TDAP status: Due, Education has been provided regarding the importance of this vaccine. Advised may receive this vaccine at local pharmacy or Health Dept. Aware to provide a copy of the vaccination record if obtained from local pharmacy or Health Dept. Verbalized acceptance and understanding. Deferred.  Shingrix vaccine- plans to complete.   Screening Tests Health Maintenance  Topic Date Due   Zoster Vaccines- Shingrix (2 of 2) 11/29/2021 (Originally 05/30/2019)   TETANUS/TDAP  08/31/2022 (Originally 08/21/2020)   COLONOSCOPY (Pts 45-42yrs Insurance coverage will need to be confirmed)  03/21/2023   Pneumonia Vaccine 85+ Years old  Completed   INFLUENZA VACCINE  Completed   DEXA SCAN  Completed   COVID-19 Vaccine  Completed   HPV VACCINES  Aged Out   Hepatitis C Screening  Discontinued   Health Maintenance There are no preventive care reminders to display for this patient.  Lung Cancer Screening: (Low Dose CT Chest recommended if Age 34-80 years, 30 pack-year currently smoking OR have quit w/in 15years.) does not qualify.   Hepatitis C Screening: does not qualify.  Vision Screening: Recommended annual ophthalmology exams for early detection of glaucoma and other disorders of the eye.  Dental Screening: Recommended annual dental exams for proper oral hygiene  Community Resource Referral / Chronic Care Management: CRR required this visit?  No   CCM required this visit?  No  Plan:   Keep all routine maintenance appointments.   I have personally reviewed and noted the following in the patients chart:   Medical and social history Use of alcohol, tobacco or illicit drugs  Current medications and supplements including opioid prescriptions. Not taking opioid.  Functional ability and status Nutritional status Physical activity Advanced directives List of other physicians Hospitalizations, surgeries, and ER visits in previous 12 months Vitals Screenings to  include cognitive, depression, and falls Referrals and appointments  In addition, I have reviewed and discussed with patient certain preventive protocols, quality metrics, and best practice recommendations. A written personalized care plan for preventive services as well as general preventive health recommendations were provided to patient.     Varney Biles, LPN   [id number]

## 2021-08-31 NOTE — Telephone Encounter (Signed)
No answer when called patient for scheduled AWV. Unable to leave a message. Reschedule as appropriate.

## 2021-09-06 DIAGNOSIS — J301 Allergic rhinitis due to pollen: Secondary | ICD-10-CM | POA: Diagnosis not present

## 2021-09-06 DIAGNOSIS — J3081 Allergic rhinitis due to animal (cat) (dog) hair and dander: Secondary | ICD-10-CM | POA: Diagnosis not present

## 2021-09-06 DIAGNOSIS — J3089 Other allergic rhinitis: Secondary | ICD-10-CM | POA: Diagnosis not present

## 2021-09-15 DIAGNOSIS — J3089 Other allergic rhinitis: Secondary | ICD-10-CM | POA: Diagnosis not present

## 2021-09-15 DIAGNOSIS — J301 Allergic rhinitis due to pollen: Secondary | ICD-10-CM | POA: Diagnosis not present

## 2021-09-15 DIAGNOSIS — J3081 Allergic rhinitis due to animal (cat) (dog) hair and dander: Secondary | ICD-10-CM | POA: Diagnosis not present

## 2021-09-20 DIAGNOSIS — J301 Allergic rhinitis due to pollen: Secondary | ICD-10-CM | POA: Diagnosis not present

## 2021-09-20 DIAGNOSIS — J3081 Allergic rhinitis due to animal (cat) (dog) hair and dander: Secondary | ICD-10-CM | POA: Diagnosis not present

## 2021-09-20 DIAGNOSIS — J3089 Other allergic rhinitis: Secondary | ICD-10-CM | POA: Diagnosis not present

## 2021-09-29 DIAGNOSIS — J301 Allergic rhinitis due to pollen: Secondary | ICD-10-CM | POA: Diagnosis not present

## 2021-09-29 DIAGNOSIS — J3081 Allergic rhinitis due to animal (cat) (dog) hair and dander: Secondary | ICD-10-CM | POA: Diagnosis not present

## 2021-09-29 DIAGNOSIS — J3089 Other allergic rhinitis: Secondary | ICD-10-CM | POA: Diagnosis not present

## 2021-10-06 DIAGNOSIS — J301 Allergic rhinitis due to pollen: Secondary | ICD-10-CM | POA: Diagnosis not present

## 2021-10-06 DIAGNOSIS — J3081 Allergic rhinitis due to animal (cat) (dog) hair and dander: Secondary | ICD-10-CM | POA: Diagnosis not present

## 2021-10-06 DIAGNOSIS — J3089 Other allergic rhinitis: Secondary | ICD-10-CM | POA: Diagnosis not present

## 2021-10-10 DIAGNOSIS — I129 Hypertensive chronic kidney disease with stage 1 through stage 4 chronic kidney disease, or unspecified chronic kidney disease: Secondary | ICD-10-CM | POA: Diagnosis not present

## 2021-10-10 DIAGNOSIS — Z609 Problem related to social environment, unspecified: Secondary | ICD-10-CM | POA: Diagnosis not present

## 2021-10-10 DIAGNOSIS — E039 Hypothyroidism, unspecified: Secondary | ICD-10-CM | POA: Diagnosis not present

## 2021-10-10 DIAGNOSIS — R42 Dizziness and giddiness: Secondary | ICD-10-CM | POA: Diagnosis not present

## 2021-10-10 DIAGNOSIS — Z79899 Other long term (current) drug therapy: Secondary | ICD-10-CM | POA: Diagnosis not present

## 2021-10-10 DIAGNOSIS — E785 Hyperlipidemia, unspecified: Secondary | ICD-10-CM | POA: Diagnosis not present

## 2021-10-10 DIAGNOSIS — N183 Chronic kidney disease, stage 3 unspecified: Secondary | ICD-10-CM | POA: Diagnosis not present

## 2021-10-10 DIAGNOSIS — G44319 Acute post-traumatic headache, not intractable: Secondary | ICD-10-CM | POA: Diagnosis not present

## 2021-10-10 DIAGNOSIS — R519 Headache, unspecified: Secondary | ICD-10-CM | POA: Diagnosis not present

## 2021-10-10 DIAGNOSIS — R2681 Unsteadiness on feet: Secondary | ICD-10-CM | POA: Diagnosis not present

## 2021-10-10 DIAGNOSIS — Z853 Personal history of malignant neoplasm of breast: Secondary | ICD-10-CM | POA: Diagnosis not present

## 2021-10-10 DIAGNOSIS — Z87891 Personal history of nicotine dependence: Secondary | ICD-10-CM | POA: Diagnosis not present

## 2021-10-10 DIAGNOSIS — R200 Anesthesia of skin: Secondary | ICD-10-CM | POA: Diagnosis not present

## 2021-10-11 ENCOUNTER — Encounter: Payer: Self-pay | Admitting: Family Medicine

## 2021-10-11 DIAGNOSIS — R519 Headache, unspecified: Secondary | ICD-10-CM | POA: Diagnosis not present

## 2021-10-11 DIAGNOSIS — G44319 Acute post-traumatic headache, not intractable: Secondary | ICD-10-CM | POA: Diagnosis not present

## 2021-10-13 NOTE — Telephone Encounter (Signed)
Patient was parked beside a truck and hit her head on the mirror she was seen at [facility] ED and had CT scan , EKG , and testing nothing was found per patient was advised to take it easy until could see PCP. Patient states her leg still feels tingling as it did after hitting mirror no worse but no better. She was advised to FU with PCP. This is what [facility] advised.   Patient's labs are reassuring. CT head without acute intracranial pathology. Concerning patient's right lower extremity no evidence of critical limb threatening ischemia, DVT, or other limb threatening etiologies. Suspect her symptoms could be related to concussion, she was discharged with concussion return precautions and PCP follow-up. Patient understands and is agreeable with this plan.   Scheduled patient with PCP for Monday 3:15 for ED follow up , advised any worsening symptoms to go back to ED, denies dizziness , excessive sleepiness,  nausea , just right leg tingling , she says head sore to touch but that's all now she can just still feel where she hit the mirror.

## 2021-10-13 NOTE — Telephone Encounter (Signed)
Pt called in stating that she went to the emergency room because her right leg was tingling since Monday. Pt stated her leg is still tingling. Sent to access nurse

## 2021-10-13 NOTE — Telephone Encounter (Signed)
Agree  Pt may need MRI back and EMG/NCS testing and neurology f/u with appt sch with PCP upcoming

## 2021-10-14 NOTE — Telephone Encounter (Signed)
Agree with advice given. Patient can see me as planned unless her symptoms progress.

## 2021-10-14 NOTE — Telephone Encounter (Signed)
Patient is scheduled to be seen.  Larraine Argo,cma

## 2021-10-17 ENCOUNTER — Ambulatory Visit (INDEPENDENT_AMBULATORY_CARE_PROVIDER_SITE_OTHER): Payer: PPO | Admitting: Family Medicine

## 2021-10-17 ENCOUNTER — Other Ambulatory Visit: Payer: Self-pay

## 2021-10-17 ENCOUNTER — Encounter: Payer: Self-pay | Admitting: Family Medicine

## 2021-10-17 VITALS — BP 140/80 | HR 64 | Temp 98.3°F | Ht 60.0 in | Wt 157.6 lb

## 2021-10-17 DIAGNOSIS — I639 Cerebral infarction, unspecified: Secondary | ICD-10-CM | POA: Insufficient documentation

## 2021-10-17 DIAGNOSIS — R200 Anesthesia of skin: Secondary | ICD-10-CM | POA: Diagnosis not present

## 2021-10-17 NOTE — Patient Instructions (Signed)
NextNice to see you. We are getting get an MRI of your brain.  If you develop new numbness weakness, or any other new symptoms please seek medical attention.

## 2021-10-17 NOTE — Progress Notes (Signed)
Tommi Rumps, MD Phone: [phone]  Gwendolyn King is a 81 y.o. female who presents today for same-day visit.  Right leg numbness: The patient reports hitting her head on a side view mirror around 10/06/2021.  She noted no loss of consciousness.  No memory issues surrounding the event.  No significant symptoms at that time.  On 10/11/2021 she started to feel funny in her head.  She had a little bit of a headache and then noted that her right leg felt as though it went to sleep.  Notes that sensation started in her great toe though gradually took over her foot and up her leg to above her knee.  She notes some memory issues since then.  She felt cloudy headed.  She notes no back pain.  She generally feels weak.  No history of stroke.  She was seen in the emergency department at [facility] and had a CT scan of her head that did not reveal any signs of acute changes.  The ED note was reviewed and they diagnosed her with a possible concussion and advised that she follow-up with me.  Social History   Tobacco Use  Smoking Status Never  Smokeless Tobacco Never    Current Outpatient Medications on File Prior to Visit  Medication Sig Dispense Refill   acebutolol (SECTRAL) 200 MG capsule Take 1 capsule (200 mg total) by mouth daily. 90 capsule 2   acetaminophen (TYLENOL) 500 MG tablet Take 1,000 mg by mouth at bedtime.     aspirin EC 81 MG tablet Take 81 mg by mouth at bedtime.      AZELASTINE HCL OP Place 1 drop into both eyes 3 (three) times daily as needed (dry/irritated eyes.).     Biotin 5 MG TABS Take 5 mg by mouth daily.     calcium-vitamin D (OSCAL WITH D) 500-200 MG-UNIT tablet Take 1 tablet by mouth daily.      cholecalciferol (VITAMIN D) 25 MCG (1000 UNIT) tablet Take 1,000 Units by mouth every evening.     EPINEPHrine 0.3 mg/0.3 mL IJ SOAJ injection Inject 0.3 mg into the muscle as needed for anaphylaxis.     Krill Oil 500 MG CAPS Take 500 mg by mouth daily.     letrozole (FEMARA)  2.5 MG tablet Take 1 tablet (2.5 mg total) by mouth daily. 90 tablet 0   levocetirizine (XYZAL) 5 MG tablet Take 5 mg by mouth every evening.   3   LEVOXYL 75 MCG tablet Take 1 tablet by mouth once daily 90 tablet 2   Multiple Vitamin (MULTIVITAMIN WITH MINERALS) TABS tablet Take 1 tablet by mouth daily. Adult 50+     polyethylene glycol powder (GLYCOLAX/MIRALAX) powder Take 17 g by mouth daily. With coffee     vitamin B-12 (CYANOCOBALAMIN) 1000 MCG tablet Take 1,000 mcg by mouth daily.     ezetimibe (ZETIA) 10 MG tablet      No current facility-administered medications on file prior to visit.     ROS see history of present illness  Objective  Physical Exam Vitals:   10/17/21 1524  BP: 140/80  Pulse: 64  Temp: 98.3 F (36.8 C)  SpO2: 99%    BP Readings from Last 3 Encounters:  10/17/21 140/80  07/05/21 (!) 152/73  05/04/21 120/80   Wt Readings from Last 3 Encounters:  10/17/21 157 lb 9.6 oz (71.5 kg)  08/31/21 159 lb (72.1 kg)  07/05/21 159 lb (72.1 kg)    Physical Exam Constitutional:  General: She is not in acute distress.    Appearance: She is not diaphoretic.  Cardiovascular:     Rate and Rhythm: Normal rate and regular rhythm.     Heart sounds: Normal heart sounds.  Pulmonary:     Effort: Pulmonary effort is normal.     Breath sounds: Normal breath sounds.  Skin:    General: Skin is warm and dry.  Neurological:     Mental Status: She is alert.     Comments: EOMI, PERRL, opens and closes eyes adequately, V1 through V3 intact bilaterally to light touch sensation, hearing intact to finger rub, shoulder shrug intact, 5/5 strength in bilateral biceps, triceps, grip, quads, hamstrings, plantar and dorsiflexion, sensation to light touch slightly diminished in her right lower extremity from the knee down, otherwise intact in bilateral UE and left LE, normal gait     Assessment/Plan: Please see individual problem list.  Problem List Items Addressed This Visit      Leg numbness - Primary    History is concerning for stroke.  The other possibility would be a lumbar spine issue though she reports no back pain.  The ED says she may have had a concussion though that would seem incredibly unlikely given that she had no symptoms around the time that she hit her head and these other symptoms started about 5 days later.  We will proceed with an MRI of her brain.  She was advised to seek medical attention if she has any worsening or new neurologic symptoms.      Relevant Orders   MR Brain Wo Contrast    Return for as scheduled.  This visit occurred during the SARS-CoV-2 public health emergency.  Safety protocols were in place, including screening questions prior to the visit, additional usage of staff PPE, and extensive cleaning of exam room while observing appropriate contact time as indicated for disinfecting solutions.    Tommi Rumps, MD [place]

## 2021-10-17 NOTE — Assessment & Plan Note (Signed)
History is concerning for stroke.  The other possibility would be a lumbar spine issue though she reports no back pain.  The ED says she may have had a concussion though that would seem incredibly unlikely given that she had no symptoms around the time that she hit her head and these other symptoms started about 5 days later.  We will proceed with an MRI of her brain.  She was advised to seek medical attention if she has any worsening or new neurologic symptoms.

## 2021-10-18 ENCOUNTER — Ambulatory Visit
Admission: RE | Admit: 2021-10-18 | Discharge: 2021-10-18 | Disposition: A | Discharge status: Home / Self Care | Payer: PPO | Source: Ambulatory Visit | Attending: Family Medicine | Admitting: Family Medicine

## 2021-10-18 DIAGNOSIS — R200 Anesthesia of skin: Secondary | ICD-10-CM | POA: Diagnosis present

## 2021-10-18 DIAGNOSIS — R519 Headache, unspecified: Secondary | ICD-10-CM | POA: Diagnosis not present

## 2021-10-18 DIAGNOSIS — I639 Cerebral infarction, unspecified: Secondary | ICD-10-CM | POA: Diagnosis not present

## 2021-10-18 DIAGNOSIS — R410 Disorientation, unspecified: Secondary | ICD-10-CM | POA: Diagnosis not present

## 2021-10-19 ENCOUNTER — Telehealth: Payer: Self-pay

## 2021-10-19 ENCOUNTER — Telehealth: Payer: Self-pay | Admitting: Family Medicine

## 2021-10-19 DIAGNOSIS — I639 Cerebral infarction, unspecified: Secondary | ICD-10-CM

## 2021-10-19 DIAGNOSIS — E782 Mixed hyperlipidemia: Secondary | ICD-10-CM

## 2021-10-19 MED ORDER — REPATHA 140 MG/ML ~~LOC~~ SOSY: 140.0000 mg | PREFILLED_SYRINGE | SUBCUTANEOUS | 1 refills | Status: DC

## 2021-10-19 NOTE — Telephone Encounter (Signed)
Lft pt vm to call ofc to sch Ct . thanks ?

## 2021-10-19 NOTE — Telephone Encounter (Signed)
I called and spoke with the patient.  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  Discussed that this could explain her symptoms.  We will proceed with further evaluation for the stroke.  She will have an echo and CT angiogram head and neck.  We will obtain lab work as outlined.  She reports her symptoms have improved some over the last day or so.  Her symptoms started 9 days ago.  Discussed that we need to treat her cholesterol.  She has been intolerant to statins and has been on atorvastatin and simvastatin in the past.  We will try to get Repatha approved by her insurance. ?

## 2021-10-19 NOTE — Telephone Encounter (Signed)
I called and spoke with patient and she is scheduled for labs next week.  Gwendolyn King,cma  ?

## 2021-10-20 DIAGNOSIS — J3081 Allergic rhinitis due to animal (cat) (dog) hair and dander: Secondary | ICD-10-CM | POA: Diagnosis not present

## 2021-10-20 DIAGNOSIS — J301 Allergic rhinitis due to pollen: Secondary | ICD-10-CM | POA: Diagnosis not present

## 2021-10-24 ENCOUNTER — Inpatient Hospital Stay
Admission: EM | Admit: 2021-10-24 | Discharge: 2021-10-26 | DRG: 247 | Disposition: A | Discharge status: Home / Self Care | Payer: PPO | Attending: Internal Medicine | Admitting: Internal Medicine

## 2021-10-24 ENCOUNTER — Inpatient Hospital Stay (HOSPITAL_COMMUNITY)
Admit: 2021-10-24 | Discharge: 2021-10-24 | Disposition: A | Discharge status: Home / Self Care | Payer: PPO | Attending: Internal Medicine | Admitting: Internal Medicine

## 2021-10-24 ENCOUNTER — Other Ambulatory Visit: Payer: Self-pay

## 2021-10-24 ENCOUNTER — Emergency Department: Payer: PPO

## 2021-10-24 DIAGNOSIS — Z853 Personal history of malignant neoplasm of breast: Secondary | ICD-10-CM | POA: Diagnosis not present

## 2021-10-24 DIAGNOSIS — I130 Hypertensive heart and chronic kidney disease with heart failure and stage 1 through stage 4 chronic kidney disease, or unspecified chronic kidney disease: Secondary | ICD-10-CM | POA: Diagnosis present

## 2021-10-24 DIAGNOSIS — D0581 Other specified type of carcinoma in situ of right breast: Secondary | ICD-10-CM | POA: Diagnosis present

## 2021-10-24 DIAGNOSIS — I255 Ischemic cardiomyopathy: Secondary | ICD-10-CM | POA: Diagnosis present

## 2021-10-24 DIAGNOSIS — N183 Chronic kidney disease, stage 3 unspecified: Secondary | ICD-10-CM | POA: Diagnosis not present

## 2021-10-24 DIAGNOSIS — E039 Hypothyroidism, unspecified: Secondary | ICD-10-CM | POA: Diagnosis present

## 2021-10-24 DIAGNOSIS — Z833 Family history of diabetes mellitus: Secondary | ICD-10-CM

## 2021-10-24 DIAGNOSIS — I214 Non-ST elevation (NSTEMI) myocardial infarction: Secondary | ICD-10-CM

## 2021-10-24 DIAGNOSIS — Z888 Allergy status to other drugs, medicaments and biological substances status: Secondary | ICD-10-CM | POA: Diagnosis not present

## 2021-10-24 DIAGNOSIS — R0789 Other chest pain: Secondary | ICD-10-CM | POA: Diagnosis not present

## 2021-10-24 DIAGNOSIS — Z79811 Long term (current) use of aromatase inhibitors: Secondary | ICD-10-CM

## 2021-10-24 DIAGNOSIS — R0602 Shortness of breath: Secondary | ICD-10-CM | POA: Diagnosis not present

## 2021-10-24 DIAGNOSIS — E785 Hyperlipidemia, unspecified: Secondary | ICD-10-CM | POA: Diagnosis present

## 2021-10-24 DIAGNOSIS — N1832 Chronic kidney disease, stage 3b: Secondary | ICD-10-CM | POA: Diagnosis present

## 2021-10-24 DIAGNOSIS — I700 Atherosclerosis of aorta: Secondary | ICD-10-CM | POA: Diagnosis present

## 2021-10-24 DIAGNOSIS — Z7982 Long term (current) use of aspirin: Secondary | ICD-10-CM | POA: Diagnosis not present

## 2021-10-24 DIAGNOSIS — Z809 Family history of malignant neoplasm, unspecified: Secondary | ICD-10-CM

## 2021-10-24 DIAGNOSIS — R079 Chest pain, unspecified: Secondary | ICD-10-CM | POA: Diagnosis present

## 2021-10-24 DIAGNOSIS — I2510 Atherosclerotic heart disease of native coronary artery without angina pectoris: Secondary | ICD-10-CM | POA: Diagnosis present

## 2021-10-24 DIAGNOSIS — I5022 Chronic systolic (congestive) heart failure: Secondary | ICD-10-CM | POA: Diagnosis present

## 2021-10-24 DIAGNOSIS — Z8249 Family history of ischemic heart disease and other diseases of the circulatory system: Secondary | ICD-10-CM

## 2021-10-24 DIAGNOSIS — Z8673 Personal history of transient ischemic attack (TIA), and cerebral infarction without residual deficits: Secondary | ICD-10-CM

## 2021-10-24 DIAGNOSIS — Z96651 Presence of right artificial knee joint: Secondary | ICD-10-CM | POA: Diagnosis present

## 2021-10-24 DIAGNOSIS — Z923 Personal history of irradiation: Secondary | ICD-10-CM

## 2021-10-24 DIAGNOSIS — M810 Age-related osteoporosis without current pathological fracture: Secondary | ICD-10-CM | POA: Diagnosis present

## 2021-10-24 DIAGNOSIS — Z79899 Other long term (current) drug therapy: Secondary | ICD-10-CM

## 2021-10-24 DIAGNOSIS — N1831 Chronic kidney disease, stage 3a: Secondary | ICD-10-CM | POA: Diagnosis not present

## 2021-10-24 DIAGNOSIS — Z83511 Family history of glaucoma: Secondary | ICD-10-CM | POA: Diagnosis not present

## 2021-10-24 DIAGNOSIS — Z6831 Body mass index (BMI) 31.0-31.9, adult: Secondary | ICD-10-CM

## 2021-10-24 DIAGNOSIS — Z20822 Contact with and (suspected) exposure to covid-19: Secondary | ICD-10-CM | POA: Diagnosis present

## 2021-10-24 DIAGNOSIS — Z8261 Family history of arthritis: Secondary | ICD-10-CM | POA: Diagnosis not present

## 2021-10-24 DIAGNOSIS — E669 Obesity, unspecified: Secondary | ICD-10-CM | POA: Diagnosis present

## 2021-10-24 DIAGNOSIS — I10 Essential (primary) hypertension: Secondary | ICD-10-CM | POA: Diagnosis present

## 2021-10-24 DIAGNOSIS — Z823 Family history of stroke: Secondary | ICD-10-CM

## 2021-10-24 DIAGNOSIS — Z9861 Coronary angioplasty status: Secondary | ICD-10-CM | POA: Diagnosis not present

## 2021-10-24 HISTORY — DX: Hypercalcemia: E83.52

## 2021-10-24 HISTORY — DX: Non-ST elevation (NSTEMI) myocardial infarction: I21.4

## 2021-10-24 LAB — CBC WITH DIFFERENTIAL/PLATELET
Abs Immature Granulocytes: 0.02 10*3/uL (ref 0.00–0.07)
Basophils Absolute: 0.1 10*3/uL (ref 0.0–0.1)
Basophils Relative: 1 %
Eosinophils Absolute: 0.2 10*3/uL (ref 0.0–0.5)
Eosinophils Relative: 3 %
HCT: 46.5 % — ABNORMAL HIGH (ref 36.0–46.0)
Hemoglobin: 15.6 g/dL — ABNORMAL HIGH (ref 12.0–15.0)
Immature Granulocytes: 0 %
Lymphocytes Relative: 51 %
Lymphs Abs: 3.9 10*3/uL (ref 0.7–4.0)
MCH: 28.8 pg (ref 26.0–34.0)
MCHC: 33.5 g/dL (ref 30.0–36.0)
MCV: 86 fL (ref 80.0–100.0)
Monocytes Absolute: 0.6 10*3/uL (ref 0.1–1.0)
Monocytes Relative: 8 %
Neutro Abs: 2.8 10*3/uL (ref 1.7–7.7)
Neutrophils Relative %: 37 %
Platelets: 150 10*3/uL (ref 150–400)
RBC: 5.41 MIL/uL — ABNORMAL HIGH (ref 3.87–5.11)
RDW: 12.3 % (ref 11.5–15.5)
WBC: 7.6 10*3/uL (ref 4.0–10.5)
nRBC: 0 % (ref 0.0–0.2)

## 2021-10-24 LAB — COMPREHENSIVE METABOLIC PANEL WITH GFR
ALT: 17 U/L (ref 0–44)
AST: 20 U/L (ref 15–41)
Albumin: 3.8 g/dL (ref 3.5–5.0)
Alkaline Phosphatase: 30 U/L — ABNORMAL LOW (ref 38–126)
Anion gap: 11 (ref 5–15)
BUN: 31 mg/dL — ABNORMAL HIGH (ref 8–23)
CO2: 27 mmol/L (ref 22–32)
Calcium: 12.1 mg/dL — ABNORMAL HIGH (ref 8.9–10.3)
Chloride: 104 mmol/L (ref 98–111)
Creatinine, Ser: 1.17 mg/dL — ABNORMAL HIGH (ref 0.44–1.00)
GFR, Estimated: 48 mL/min — ABNORMAL LOW
Glucose, Bld: 115 mg/dL — ABNORMAL HIGH (ref 70–99)
Potassium: 3.7 mmol/L (ref 3.5–5.1)
Sodium: 142 mmol/L (ref 135–145)
Total Bilirubin: 0.7 mg/dL (ref 0.3–1.2)
Total Protein: 7.1 g/dL (ref 6.5–8.1)

## 2021-10-24 LAB — TROPONIN I (HIGH SENSITIVITY)
Troponin I (High Sensitivity): 182 ng/L
Troponin I (High Sensitivity): 1282 ng/L
Troponin I (High Sensitivity): 1424 ng/L
Troponin I (High Sensitivity): 1585 ng/L

## 2021-10-24 LAB — ECHOCARDIOGRAM COMPLETE
AR max vel: 2.78 cm2
AV Area VTI: 2.89 cm2
AV Area mean vel: 2.56 cm2
AV Mean grad: 2.5 mmHg
AV Peak grad: 3.6 mmHg
Ao pk vel: 0.95 m/s
Area-P 1/2: 5.2 cm2
Calc EF: 28.9 %
Height: 60 in
MV VTI: 2.8 cm2
S' Lateral: 2.7 cm
Single Plane A2C EF: 22.1 %
Single Plane A4C EF: 38.8 %
Weight: 2560 [oz_av]

## 2021-10-24 LAB — PROTIME-INR
INR: 1 (ref 0.8–1.2)
Prothrombin Time: 13 s (ref 11.4–15.2)

## 2021-10-24 LAB — RESP PANEL BY RT-PCR (FLU A&B, COVID) ARPGX2
Influenza A by PCR: NEGATIVE
Influenza B by PCR: NEGATIVE
SARS Coronavirus 2 by RT PCR: NEGATIVE

## 2021-10-24 LAB — MAGNESIUM: Magnesium: 2 mg/dL (ref 1.7–2.4)

## 2021-10-24 LAB — APTT: aPTT: 28 s (ref 24–36)

## 2021-10-24 LAB — HEPARIN LEVEL (UNFRACTIONATED): Heparin Unfractionated: 0.18 [IU]/mL — ABNORMAL LOW (ref 0.30–0.70)

## 2021-10-24 LAB — BRAIN NATRIURETIC PEPTIDE: B Natriuretic Peptide: 76.6 pg/mL (ref 0.0–100.0)

## 2021-10-24 MED ORDER — METHOCARBAMOL 1000 MG/10ML IJ SOLN
500.0000 mg | Freq: Four times a day (QID) | INTRAVENOUS | Status: DC | PRN
  Filled 2021-10-24 (×2): qty 5

## 2021-10-24 MED ORDER — ASPIRIN EC 81 MG PO TBEC
81.0000 mg | DELAYED_RELEASE_TABLET | Freq: Every day | ORAL | Status: DC
  Administered 2021-10-26: 81 mg via ORAL
  Filled 2021-10-24: qty 1

## 2021-10-24 MED ORDER — ONDANSETRON HCL 4 MG/2ML IJ SOLN: 4.0000 mg | Freq: Four times a day (QID) | INTRAMUSCULAR | Status: DC | PRN

## 2021-10-24 MED ORDER — HEPARIN BOLUS VIA INFUSION
4000.0000 [IU] | Freq: Once | INTRAVENOUS | Status: DC
  Filled 2021-10-24: qty 4000

## 2021-10-24 MED ORDER — LETROZOLE 2.5 MG PO TABS
2.5000 mg | ORAL_TABLET | Freq: Every day | ORAL | Status: DC
  Administered 2021-10-24 – 2021-10-26 (×2): 2.5 mg via ORAL
  Filled 2021-10-24 (×3): qty 1

## 2021-10-24 MED ORDER — POLYETHYLENE GLYCOL 3350 17 G PO PACK
17.0000 g | PACK | Freq: Every day | ORAL | Status: DC
  Administered 2021-10-26: 17 g via ORAL
  Filled 2021-10-24: qty 1

## 2021-10-24 MED ORDER — HEPARIN BOLUS VIA INFUSION
3700.0000 [IU] | Freq: Once | INTRAVENOUS | Status: AC
  Administered 2021-10-24: 3700 [IU] via INTRAVENOUS
  Filled 2021-10-24: qty 3700

## 2021-10-24 MED ORDER — ASPIRIN EC 81 MG PO TBEC: 81.0000 mg | DELAYED_RELEASE_TABLET | Freq: Every day | ORAL | Status: DC

## 2021-10-24 MED ORDER — ACEBUTOLOL HCL 200 MG PO CAPS
200.0000 mg | ORAL_CAPSULE | Freq: Every day | ORAL | Status: DC
  Administered 2021-10-24 – 2021-10-25 (×2): 200 mg via ORAL
  Filled 2021-10-24 (×4): qty 1

## 2021-10-24 MED ORDER — KETOTIFEN FUMARATE 0.025 % OP SOLN
1.0000 [drp] | Freq: Three times a day (TID) | OPHTHALMIC | Status: DC | PRN
  Filled 2021-10-24: qty 5

## 2021-10-24 MED ORDER — SODIUM CHLORIDE 0.9% FLUSH
3.0000 mL | Freq: Two times a day (BID) | INTRAVENOUS | Status: DC
  Administered 2021-10-24 – 2021-10-26 (×3): 3 mL via INTRAVENOUS

## 2021-10-24 MED ORDER — HEPARIN (PORCINE) 25000 UT/250ML-% IV SOLN
950.0000 [IU]/h | INTRAVENOUS | Status: DC
  Administered 2021-10-24: 750 [IU]/h via INTRAVENOUS
  Filled 2021-10-24 (×2): qty 250

## 2021-10-24 MED ORDER — FUROSEMIDE 10 MG/ML IJ SOLN
40.0000 mg | Freq: Once | INTRAMUSCULAR | Status: AC
  Administered 2021-10-24: 40 mg via INTRAVENOUS
  Filled 2021-10-24: qty 4

## 2021-10-24 MED ORDER — SODIUM CHLORIDE 0.45 % IV SOLN: INTRAVENOUS | Status: AC

## 2021-10-24 MED ORDER — POTASSIUM CHLORIDE CRYS ER 20 MEQ PO TBCR
40.0000 meq | EXTENDED_RELEASE_TABLET | Freq: Once | ORAL | Status: AC
  Administered 2021-10-24: 40 meq via ORAL
  Filled 2021-10-24: qty 2

## 2021-10-24 MED ORDER — ACETAMINOPHEN 325 MG PO TABS
650.0000 mg | ORAL_TABLET | ORAL | Status: DC | PRN
  Administered 2021-10-24: 650 mg via ORAL
  Filled 2021-10-24: qty 2

## 2021-10-24 MED ORDER — NITROGLYCERIN 2 % TD OINT
0.5000 [in_us] | TOPICAL_OINTMENT | Freq: Once | TRANSDERMAL | Status: AC
  Administered 2021-10-24: 0.5 [in_us] via TOPICAL
  Filled 2021-10-24: qty 1

## 2021-10-24 MED ORDER — EZETIMIBE 10 MG PO TABS
10.0000 mg | ORAL_TABLET | Freq: Every day | ORAL | Status: DC
  Administered 2021-10-24: 10 mg via ORAL
  Filled 2021-10-24 (×2): qty 1

## 2021-10-24 MED ORDER — ACETAMINOPHEN 500 MG PO TABS
1000.0000 mg | ORAL_TABLET | Freq: Every day | ORAL | Status: DC
  Administered 2021-10-24 – 2021-10-25 (×2): 1000 mg via ORAL
  Filled 2021-10-24 (×2): qty 2

## 2021-10-24 MED ORDER — NITROGLYCERIN 0.4 MG SL SUBL: 0.4000 mg | SUBLINGUAL_TABLET | SUBLINGUAL | Status: DC | PRN

## 2021-10-24 MED ORDER — NITROGLYCERIN 0.4 MG SL SUBL
0.4000 mg | SUBLINGUAL_TABLET | SUBLINGUAL | Status: DC | PRN
  Administered 2021-10-24 (×2): 0.4 mg via SUBLINGUAL
  Filled 2021-10-24 (×2): qty 1

## 2021-10-24 MED ORDER — ADULT MULTIVITAMIN W/MINERALS CH
1.0000 | ORAL_TABLET | Freq: Every day | ORAL | Status: DC
  Administered 2021-10-24 – 2021-10-26 (×2): 1 via ORAL
  Filled 2021-10-24 (×2): qty 1

## 2021-10-24 MED ORDER — AMLODIPINE BESYLATE 5 MG PO TABS
5.0000 mg | ORAL_TABLET | Freq: Every day | ORAL | Status: DC
  Administered 2021-10-24 – 2021-10-26 (×2): 5 mg via ORAL
  Filled 2021-10-24 (×2): qty 1

## 2021-10-24 MED ORDER — VITAMIN B-12 1000 MCG PO TABS
1000.0000 ug | ORAL_TABLET | Freq: Every day | ORAL | Status: DC
  Administered 2021-10-24 – 2021-10-26 (×2): 1000 ug via ORAL
  Filled 2021-10-24 (×2): qty 1

## 2021-10-24 MED ORDER — HEPARIN BOLUS VIA INFUSION
1900.0000 [IU] | Freq: Once | INTRAVENOUS | Status: AC
  Administered 2021-10-24: 1900 [IU] via INTRAVENOUS
  Filled 2021-10-24: qty 1900

## 2021-10-24 MED ORDER — CALCIUM CARBONATE-VITAMIN D 500-200 MG-UNIT PO TABS: 1.0000 | ORAL_TABLET | Freq: Every day | ORAL | Status: DC

## 2021-10-24 MED ORDER — LEVOTHYROXINE SODIUM 50 MCG PO TABS
75.0000 ug | ORAL_TABLET | Freq: Every day | ORAL | Status: DC
  Administered 2021-10-24 – 2021-10-26 (×3): 75 ug via ORAL
  Filled 2021-10-24 (×2): qty 1
  Filled 2021-10-24: qty 2

## 2021-10-24 NOTE — Progress Notes (Signed)
ANTICOAGULATION CONSULT NOTE - Initial Consult ? ?Pharmacy Consult for Heparin drip ?Indication: chest pain/ACS ? ?Allergies  ?Allergen Reactions  ? Singulair [Montelukast] Itching  ? Lipitor [Atorvastatin] Other (See Comments)  ?  Myalgia, arthralgia  ? Simvastatin Other (See Comments)  ?  myalgia  ? ? ?Patient Measurements: ?Height: 5' (152.4 cm) ?Weight: 72.6 kg (160 lb) ?IBW/kg (Calculated) : 45.5 ?Heparin Dosing Weight: 61.6 kg ? ?Vital Signs: ?Temp: 97.7 ?F (36.5 ?C) (03/06 1819) ?BP: 175/68 (03/06 1819) ?Pulse Rate: 69 (03/06 1819) ? ?Labs: ?Recent Labs  ?  10/24/21 ?0626 10/24/21 ?0822 10/24/21 ?1126 10/24/21 ?1400 10/24/21 ?1800  ?HGB 15.6*  --   --   --   --   ?HCT 46.5*  --   --   --   --   ?PLT 150  --   --   --   --   ?APTT 28  --   --   --   --   ?LABPROT 13.0  --   --   --   --   ?INR 1.0  --   --   --   --   ?HEPARINUNFRC  --   --   --   --  0.18*  ?CREATININE 1.17*  --   --   --   --   ?TROPONINIHS 182* 1,282* 1,424* 1,585*  --   ? ? ? ?Estimated Creatinine Clearance: 35.2 mL/min (A) (by C-G formula based on SCr of 1.17 mg/dL (H)). ? ? ?Medical History: ?Past Medical History:  ?Diagnosis Date  ? Allergy   ? hay fever, fall allergies  ? Arthritis   ? Asthma   ? as a child, dad was smoker  ? 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  ? Cataract   ? Bil  ? Chicken pox   ? Chronic kidney disease   ? stage 3  ? Colon polyp   ? Constipation   ? Hyperlipidemia   ? Hypothyroidism   ? Palpitations   ? over 20 yeras ago  ? Personal history of radiation therapy   ? Thyroid disease   ? ? ?Medications:  ?Scheduled:  ? acebutolol  200 mg Oral QHS  ? acetaminophen  1,000 mg Oral QHS  ? [START ON 10/25/2021] aspirin EC  81 mg Oral Daily  ? ezetimibe  10 mg Oral Daily  ? letrozole  2.5 mg Oral Daily  ? levothyroxine  75 mcg Oral Q0600  ? multivitamin with minerals  1 tablet Oral Daily  ? polyethylene glycol  17 g Oral Daily  ?  sodium chloride flush  3 mL Intravenous Q12H  ? vitamin B-12  1,000 mcg Oral Daily  ? ?Infusions:  ? sodium chloride 100 mL/hr at 10/24/21 0955  ? heparin 750 Units/hr (10/24/21 0820)  ? ? ?Assessment: ?81 yo F to start Heparin drip for ACS/STEMI. ?No anticoagulants PTA per Med Rec ?Hgb 15.6  plt 150  aPTT 28   INR 1.0 ? ?3/6$RemoveB'@1800'HtSkpusW$ : HL 0.18, subtherapeutic ? ? ?Goal of Therapy:  ?Heparin level 0.3-0.7 units/ml ?Monitor platelets by anticoagulation protocol: Yes ?  ?Plan:  ?Give 1900 units bolus x 1 ?Increase heparin infusion to 950 units/hr ?Check anti-Xa level in 8 hours after rate change and daily while on heparin ?Continue to monitor H&H and platelets ? ?Kimyatta Lecy A Jandi Swiger ?10/24/2021,6:41 PM ? ? ?

## 2021-10-24 NOTE — Assessment & Plan Note (Signed)
Most likely secondary to hypertension ?Renal function is stable ?Monitor closely during this hospitalization ?

## 2021-10-24 NOTE — Assessment & Plan Note (Signed)
Patient presents to the ER for evaluation of chest pain and has an uptrending troponin value consistent with non-ST elevation MI ?Continue aspirin, beta-blocker, Zetia and heparin drip ?Obtain 2D echocardiogram to assess LVEF and rule out regional wall motion abnormality ?We will request cardiology consult ?
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
## 2021-10-24 NOTE — ED Notes (Signed)
Left heart cath /PCI scheduled for tomorrow via R radial artery, to be done by Dr. Saunders Revel, NPO after midnight.  ?

## 2021-10-24 NOTE — ED Notes (Signed)
Cardiology MD at [facility] ?

## 2021-10-24 NOTE — ED Notes (Signed)
Cards in to see ?

## 2021-10-24 NOTE — ED Provider Notes (Signed)
? ?[facility] ?Provider Note ? ? ? Event Date/Time  ? First MD Initiated Contact with Patient 10/24/21 [phone]   ?  (approximate) ? ? ?History  ? ?Chest Pain ? ? ?HPI ? ?Gwendolyn King is a 81 y.o. female past medical history of hypertension, hyperlipidemia, breast cancer, recent CVA who presents with chest pain.  Symptoms started around 3:30 AM and woke her up from sleep.  She describes a pressure sensation between her breasts without radiation.  She denies diaphoresis nausea vomiting shortness of breath or lightheadedness.  Symptoms did not worsen with exertion.  Pain improved after nitroglycerin received with EMS.  Patient notes that 2 days ago while she was walking across the street to her daughter's house she had similar chest pain that was short-lived and not as severe.  Prior to this she has not had previous episodes of chest pain, no coronary disease or history of MI.  No history of DVT/PE.  Does currently still have ongoing chest tightness which she describes as a mild tenderness. ?  ? ?Past Medical History:  ?Diagnosis Date  ? Allergy   ? hay fever, fall allergies  ? Arthritis   ? Asthma   ? as a child, dad was smoker  ? Breast cancer ([place]) 05/08/2017  ? 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  ? Cataract   ? Bil  ? Chicken pox   ? Chronic kidney disease   ? stage 3  ? Colon polyp   ? Constipation   ? Hyperlipidemia   ? Hypothyroidism   ? Palpitations   ? over 20 yeras ago  ? Personal history of radiation therapy   ? Thyroid disease   ? ? ?Patient Active Problem List  ? Diagnosis Date Noted  ? Leg numbness 10/17/2021  ? Chronic low back pain 05/04/2021  ? Vaginal atrophy 11/09/2020  ? 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 Aortic atherosclerosis ([place]) 11/01/2020  ? Thrombocytopenia, unspecified ([place]) 11/01/2020  ? Osteoporosis 06/15/2020  ? Venous insufficiency 05/03/2020  ? Leg wound, right, subsequent encounter  01/27/2020  ? Skin irritation 10/24/2019  ? Lipoma 09/08/2019  ? Lesion of subcutaneous tissue 09/08/2019  ? Allergic rhinitis 04/21/2019  ? Poison ivy 04/21/2019  ? 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 Cystocele, midline 09/25/2018  ? S/P hysterectomy 07/31/2018  ? CKD (chronic kidney disease) stage 3, GFR 30-59 ml/min (HCC) 05/22/2018  ? 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 Mastalgia 05/17/2017  ? Obesity 05/18/2016  ? Hyperlipidemia 03/26/2015  ? Palpitations 02/03/2015  ? Hypothyroidism 05/01/2012  ? Hypertension 05/01/2012  ? ? ? ?Physical Exam  ?Triage Vital Signs: ?ED Triage Vitals  ?Enc Vitals Group  ?   BP 10/24/21 0619 (!) 190/87  ?   Pulse Rate 10/24/21 0619 60  ?   Resp 10/24/21 0619 12  ?   Temp 10/24/21 0619 98.7 ?F (37.1 ?C)  ?   Temp Source 10/24/21 0619 Oral  ?   SpO2 10/24/21 0617 100 %  ?   Weight --   ?   Height --   ?   Head Circumference --   ?   Peak Flow --   ?   Pain Score 10/24/21 0617 2  ?   Pain Loc --   ?   Pain Edu? --   ?   Excl.  in [place]? --   ? ? ?Most recent vital signs: ?Vitals:  ? 10/24/21 0617 10/24/21 0619  ?BP:  (!) 190/87  ?Pulse:  60  ?Resp:  12  ?Temp:  98.7 ?F (37.1 ?C)  ?SpO2: 100% 100%  ? ? ? ?General: Awake, no distress.  ?CV:  Good peripheral perfusion.  1+ lower extremity edema bilaterally, symmetric ?Resp:  Normal effort.  ?Abd:  No distention.  ?Neuro:             Awake, Alert, Oriented x 3  ?Other:   ? ? ?ED Results / Procedures / Treatments  ?Labs ?(all labs ordered are listed, but only abnormal results are displayed) ?Labs Reviewed  ?COMPREHENSIVE METABOLIC PANEL  ?CBC WITH DIFFERENTIAL/PLATELET  ?BRAIN NATRIURETIC PEPTIDE  ?TROPONIN I (HIGH SENSITIVITY)  ? ? ? ?EKG ? ?EKG interpreted by myself, normal sinus rhythm, normal axis normal intervals, inverted T wave in aVL, no other acute ischemic changes ? ? ?RADIOLOGY ? ? ? ?PROCEDURES: ? ?Critical Care performed: No ? ?.1-3 Lead EKG  Interpretation ?Performed by: Rada Hay, MD ?Authorized by: Rada Hay, MD  ? ?  Interpretation: normal   ?  ECG rate assessment: normal   ?  Ectopy: none   ?  Conduction: normal   ? ?The patient is on the cardiac monitor to evaluate for evidence of arrhythmia and/or significant heart rate changes. ? ? ?MEDICATIONS ORDERED IN ED: ?Medications  ?nitroGLYCERIN (NITROSTAT) SL tablet 0.4 mg (0.4 mg Sublingual Given 10/24/21 0635)  ? ? ? ?IMPRESSION / MDM / ASSESSMENT AND PLAN / ED COURSE  ?I reviewed the triage vital signs and the nursing notes. ?             ?               ? ?Differential diagnosis includes, but is not limited to, unstable angina, NSTEMI, GERD, pulmonary embolism, aortic dissection ? ?Patient is a 81 year old female with history of multiple cardiac risk factors including hypertension hyperlipidemia and recent CVA presents with chest pain started around 3 3 AM.  Described as pressure-like nonradiating without other associated symptoms.  Pain is significantly improved after nitroglycerin however she does still have some ongoing discomfort.  I reviewed her EKG which does show a flattened T wave in aVL but otherwise there is no acute ischemic change.  She is significantly hypertensive blood pressure 190/87, will treat with ongoing nitroglycerin as needed for chest pain.  She already received 325 of aspirin with EMS.  My primary concern is ACS.  Especially with the prior episode on Saturday.  Low suspicion for dissection based on the quality of the pain.  Pulmonary embolism also lower likelihood given she is not tachycardic nor hypoxic and she is not short of breath.  Patient signed out to oncoming provider pending serial troponins chest x-ray and labs. ? ?  ? ? ?FINAL CLINICAL IMPRESSION(S) / ED DIAGNOSES  ? ?Final diagnoses:  ?Chest pain, unspecified type  ? ? ? ?Rx / DC Orders  ? ?ED Discharge Orders   ? ? None  ? ?  ? ? ? ?Note:  This document was prepared using Dragon voice recognition  software and may include unintentional dictation errors. ?  ?Rada Hay, MD ?10/24/21 [phone] ? ?

## 2021-10-24 NOTE — ED Triage Notes (Signed)
Arrived via EMS. Reports she woke up with severe chest pressure of 8/10. 324 aspirin and 2 nitro SL administered by EMS. 20 G L AC. AOX4, resp even, unlabored, denies shortness of breath. Reports pain of 2/10 on arrival.  ?

## 2021-10-24 NOTE — Assessment & Plan Note (Addendum)
Status post lumpectomy and radiation therapy ?Continue letrozole ?

## 2021-10-24 NOTE — ED Notes (Signed)
Back to bed from [facility]. Echo at [facility].  ?
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
## 2021-10-24 NOTE — ED Notes (Signed)
Cardiologist at bedside.  

## 2021-10-24 NOTE — ED Notes (Signed)
3rd trop drawn and sent, repeat EKG done.  ?

## 2021-10-24 NOTE — Progress Notes (Signed)
*  PRELIMINARY RESULTS* ?Echocardiogram ?2D Echocardiogram has been performed. ? ?Cope Marte, Sonia Side ?10/24/2021, 1:27 PM ?

## 2021-10-24 NOTE — ED Notes (Signed)
Admitting at BS

## 2021-10-24 NOTE — Assessment & Plan Note (Signed)
Most likely iatrogenic and secondary to vitamin D and calcium supplements ?Hold oral calcium and vitamin D supplements ?IV fluid hydration ?IV Lasix ?Repeat calcium levels in a.m. ?

## 2021-10-24 NOTE — ED Notes (Signed)
Up to br

## 2021-10-24 NOTE — ED Provider Notes (Signed)
Patient received in signout from Dr. Darden Dates.  Repeat troponin significant elevated to 182.  Currently rates her pain as 1 out of 10.  Repeat EKG without dynamic changes.  Will heparinize due to concern for ACS.  Patient will require admission the hospital.  I have consulted with the hospitalist who, accept patient to their service. ?  ?Gwendolyn Lot, MD ?10/24/21 0740 ? ?

## 2021-10-24 NOTE — ED Notes (Signed)
Echo at [facility], attempting to measure output ?

## 2021-10-24 NOTE — H&P (Signed)
History and Physical    Patient: Gwendolyn King XAJ:[id number] DOB: 12-27-1940 DOA: 10/24/2021 DOS: the patient was seen and examined on 10/24/2021 PCP: Leone Haven, MD  Patient coming from: Home  Chief Complaint:  Chief Complaint  Patient presents with   Chest Pain   HPI: Gwendolyn King is a 81 y.o. 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  She rates her pain an 8 out of 10 in intensity at its worst.  She describes it a pressure sensation between her breasts without any radiation.  She denies having any associated nausea, no vomiting, no diaphoresis, no palpitations or shortness of breath.  She had an episode 2 days prior to this admission which occurred with exertion but was short-lived and not as severe.  She had some relief with sublingual nitroglycerin and pain is down to 3 x 10 in intensity. She denies having any abdominal pain, no changes in her bowel habits, no fever, no chills, no cough, no urinary symptoms, no blurred vision, no dizziness, no lightheadedness, no focal deficit. Review of Systems: As mentioned in the history of present illness. All other systems reviewed and are negative. 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   Cataract    Bil   Chicken pox    Chronic kidney disease    stage 3   Colon polyp    Constipation    Hyperlipidemia    Hypothyroidism    Palpitations    over 20 yeras ago   Personal history of radiation therapy    Thyroid disease    Past Surgical History:   Procedure Laterality Date   ABDOMINAL HYSTERECTOMY  1981   APPENDECTOMY  2012   BREAST BIOPSY Right 05/08/2017   Affirm Bx-DUCTAL CARCINOMA IN SITU (DCIS) WITH ONE FOCUS SUSPICIOUS FOR invasive   BREAST BIOPSY Right 05/17/2017   [place] bx done at DR. 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 2 microscopic foci lateral inferior margins. Patient elected not to proceed to reexcision.   COLONOSCOPY     COLONOSCOPY W/ POLYPECTOMY     CYSTOCELE REPAIR N/A 09/11/2019   Procedure: ANTERIOR COLPORRHAPHY;  Surgeon: Gae Dry, MD;  Location: [facility] ORS;  Service: Gynecology;  Laterality: N/A;   FINGER FRACTURE SURGERY Right    5th   HAMMER TOE SURGERY  09/20/2017   JOINT REPLACEMENT     right knee   ROTATOR CUFF REPAIR Right    TONSILLECTOMY AND ADENOIDECTOMY  1963   TOTAL KNEE ARTHROPLASTY Right 10/17/2016   Procedure: TOTAL  KNEE ARTHROPLASTY;  Surgeon: Melrose Nakayama, MD;  Location: [place];  Service: Orthopedics;  Laterality: Right;   VAGINAL DELIVERY     3   Social History:  reports that she has never smoked. She has never used smokeless tobacco. She reports current alcohol use. She reports that she does not use drugs.  Allergies  Allergen Reactions   Singulair [Montelukast] Itching   Lipitor [Atorvastatin] Other (See Comments)    Myalgia, arthralgia   Simvastatin Other (See Comments)    myalgia    Family History  Problem Relation Age of Onset   Arthritis Mother    Heart disease Mother    Hypertension Mother     Arthritis Father    Heart disease Father    Hypertension Father    Heart attack Father    Stroke Maternal Grandmother    Cancer Maternal Aunt        abdominal?   Hyperlipidemia Maternal Aunt    Diabetes Paternal Aunt    Glaucoma Brother    Hyperlipidemia Other    Diabetes Other    Breast cancer Neg Hx     Prior to Admission medications   Medication Sig Start Date End Date Taking? Authorizing Provider  acebutolol (SECTRAL) 200 MG capsule Take 1 capsule (200 mg total) by mouth daily. 06/14/21   Leone Haven, MD  acetaminophen (TYLENOL) 500 MG tablet Take 1,000 mg by mouth at bedtime.    [provider]  aspirin EC 81 MG tablet Take 81 mg by mouth at bedtime.     [provider]  AZELASTINE HCL OP Place 1 drop into both eyes 3 (three) times daily as needed (dry/irritated eyes.).    [provider]  Biotin 5 MG TABS Take 5 mg by mouth daily.    [provider]  calcium-vitamin D (OSCAL WITH D) 500-200 MG-UNIT tablet Take 1 tablet by mouth daily.     [provider]  cholecalciferol (VITAMIN D) 25 MCG (1000 UNIT) tablet Take 1,000 Units by mouth every evening.    [provider]  EPINEPHrine 0.3 mg/0.3 mL IJ SOAJ injection Inject 0.3 mg into the muscle as needed for anaphylaxis.    [provider]  Evolocumab (REPATHA) 140 MG/ML SOSY Inject 140 mg into the skin every 14 (fourteen) days. 10/19/21   Leone Haven, MD  ezetimibe (ZETIA) 10 MG tablet  02/13/20   [provider]  Javier Docker Oil 500 MG CAPS Take 500 mg by mouth daily.    [provider]  letrozole (FEMARA) 2.5 MG tablet Take 1 tablet (2.5 mg total) by mouth daily. 05/26/21   Lloyd Huger, MD  levocetirizine (XYZAL) 5 MG tablet Take 5 mg by mouth every evening.  05/09/18   [provider]  LEVOXYL 75 MCG tablet Take 1 tablet by mouth once daily 06/14/21   Leone Haven, MD  Multiple Vitamin (MULTIVITAMIN WITH MINERALS) TABS  tablet Take 1 tablet by mouth daily. Adult 50+    [provider]  polyethylene glycol powder (GLYCOLAX/MIRALAX) powder Take 17 g by mouth daily. With coffee    [provider]  vitamin B-12 (CYANOCOBALAMIN) 1000 MCG tablet Take 1,000 mcg by mouth daily.    [provider]    Physical Exam: Vitals:   10/24/21 0730 10/24/21 0752 10/24/21 0800 10/24/21 0830  BP: (!) 163/66  (!) 180/83 (!) 172/74  Pulse: (!) 55  60 (!) 58  Resp: $Remo'12  17 11  'MuiuA$ Temp:  TempSrc:      SpO2: 100%  100% 100%  Weight:  72.6 kg    Height:  5' (1.524 m)     Physical Exam Vitals and nursing note reviewed.  Constitutional:      Appearance: She is well-developed and normal weight.  HENT:     Head: Normocephalic and atraumatic.  Eyes:     Pupils: Pupils are equal, round, and reactive to light.  Cardiovascular:     Rate and Rhythm: Normal rate and regular rhythm.  Pulmonary:     Effort: Pulmonary effort is normal.  Abdominal:     General: Bowel sounds are normal.     Palpations: Abdomen is soft.  Musculoskeletal:        General: Normal range of motion.  Skin:    General: Skin is warm and dry.  Neurological:     General: No focal deficit present.     Mental Status: She is alert and oriented to person, place, and time.  Psychiatric:        Mood and Affect: Mood normal.        Behavior: Behavior normal.    Data Reviewed: Relevant notes from primary care and specialist visits, past discharge summaries as available in EHR, including Care Everywhere. Prior diagnostic testing as pertinent to current admission diagnoses Updated medications and problem lists for reconciliation ED course, including vitals, labs, imaging, treatment and response to treatment Triage notes, nursing and pharmacy notes and ED provider's notes Notable results as noted in HPI Labs reviewed and patient has an uptrending troponin level. 182 >> 1282 Serum glucose 115, BUN 31, creatinine 1.17, calcium 12.1,  hemoglobin 15.6, hematocrit 46.5 Chest x-ray reviewed by me shows Chronic pulmonary interstitial changes. No acute cardiopulmonary abnormality. Twelve-lead EKG reviewed by me shows sinus rhythm with ST changes in the inferior leads There are no new results to review at this time.  Assessment and Plan: * NSTEMI (non-ST elevated myocardial infarction) [facility]) Patient presents to the ER for evaluation of chest pain and has an uptrending troponin value consistent with non-ST elevation MI Continue aspirin, beta-blocker, Zetia and heparin drip Obtain 2D echocardiogram to assess LVEF and rule out regional wall motion abnormality We will request cardiology consult  Hypercalcemia Most likely iatrogenic and secondary to vitamin D and calcium supplements Hold oral calcium and vitamin D supplements IV fluid hydration IV Lasix Repeat calcium levels in a.m.  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  All questions and concerns have been addressed.  She verbalizes understanding and agrees with the plan.  Severity of Illness: The appropriate patient status for this patient is INPATIENT. Inpatient status is judged to be reasonable and necessary in order to provide the required intensity of service to ensure the patient's safety. The patient's presenting symptoms, physical exam findings, and initial radiographic and laboratory data in the context of their chronic comorbidities is  felt to place them at high risk for further clinical deterioration. Furthermore, it is not anticipated that the patient will be medically stable  for discharge from the hospital within 2 midnights of admission.   * I certify that at the point of admission it is my clinical judgment that the patient will require inpatient hospital care spanning beyond 2 midnights from the point of admission due to high intensity of service, high risk for further deterioration and high frequency of surveillance required.*  Author: Collier Bullock, MD 10/24/2021 9:20 AM  For on call review [email or web address].

## 2021-10-24 NOTE — Progress Notes (Signed)
ANTICOAGULATION CONSULT NOTE - Initial Consult ? ?Pharmacy Consult for Heparin drip ?Indication: chest pain/ACS ? ?Allergies  ?Allergen Reactions  ? Singulair [Montelukast] Itching  ? Lipitor [Atorvastatin] Other (See Comments)  ?  Myalgia, arthralgia  ? Simvastatin Other (See Comments)  ?  myalgia  ? ? ?Patient Measurements: ?Height: 5' (152.4 cm) ?Weight: 72.6 kg (160 lb) ?IBW/kg (Calculated) : 45.5 ?Heparin Dosing Weight: 61.6 kg ? ?Vital Signs: ?Temp: 98.7 ?F (37.1 ?C) (03/06 2440) ?Temp Source: Oral (03/06 1027) ?BP: 180/83 (03/06 0800) ?Pulse Rate: 60 (03/06 0800) ? ?Labs: ?Recent Labs  ?  10/24/21 ?0626  ?HGB 15.6*  ?HCT 46.5*  ?PLT 150  ?APTT 28  ?LABPROT 13.0  ?INR 1.0  ?CREATININE 1.17*  ?TROPONINIHS 182*  ? ? ?Estimated Creatinine Clearance: 35.2 mL/min (A) (by C-G formula based on SCr of 1.17 mg/dL (H)). ? ? ?Medical History: ?Past Medical History:  ?Diagnosis Date  ? Allergy   ? hay fever, fall allergies  ? Arthritis   ? Asthma   ? as a child, dad was smoker  ? 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  ? Cataract   ? Bil  ? Chicken pox   ? Chronic kidney disease   ? stage 3  ? Colon polyp   ? Constipation   ? Hyperlipidemia   ? Hypothyroidism   ? Palpitations   ? over 20 yeras ago  ? Personal history of radiation therapy   ? Thyroid disease   ? ? ?Medications:  ?Scheduled:  ?Infusions:  ? heparin 750 Units/hr (10/24/21 0820)  ? ? ?Assessment: ?81 yo F to start Heparin drip for ACS/STEMI. ?No anticoagulants PTA per Med Rec ?Hgb 15.6  plt 150  aPTT 28   INR 1.0 ? ? ?Goal of Therapy:  ?Heparin level 0.3-0.7 units/ml ?Monitor platelets by anticoagulation protocol: Yes ?  ?Plan:  ?Give 3700 units bolus x 1 ?Start heparin infusion at 750 units/hr ?Check anti-Xa level in 8 hours and daily while on heparin ?Continue to monitor H&H and platelets ? ?Bellamy Rubey A ?10/24/2021,8:40 AM ? ? ?

## 2021-10-24 NOTE — Assessment & Plan Note (Signed)
Continue acebutolol ?

## 2021-10-24 NOTE — ED Notes (Signed)
Reports onset of CP, troponin elevated, EDP notified, repeat EKG obtained, describes pain as constant, fluctuates, "like someone mashing on chest", mid sternal some TTP. Pt alert, NAD, calm, interactive, speaking in clear complete sentences. Family x2 at [facility].  ?

## 2021-10-24 NOTE — Assessment & Plan Note (Signed)
Stable Continue Synthroid 

## 2021-10-25 ENCOUNTER — Encounter
Admission: EM | Disposition: A | Discharge status: Home / Self Care | Payer: PPO | Source: Home / Self Care | Attending: Internal Medicine

## 2021-10-25 ENCOUNTER — Encounter: Payer: Self-pay | Admitting: Internal Medicine

## 2021-10-25 DIAGNOSIS — I2510 Atherosclerotic heart disease of native coronary artery without angina pectoris: Secondary | ICD-10-CM

## 2021-10-25 HISTORY — PX: CORONARY STENT INTERVENTION: CATH118234

## 2021-10-25 HISTORY — PX: INTRAVASCULAR PRESSURE WIRE/FFR STUDY: CATH118243

## 2021-10-25 HISTORY — PX: LEFT HEART CATH AND CORONARY ANGIOGRAPHY: CATH118249

## 2021-10-25 LAB — GLUCOSE, CAPILLARY: Glucose-Capillary: 91 mg/dL (ref 70–99)

## 2021-10-25 LAB — HEPATIC FUNCTION PANEL
ALT: 16 U/L (ref 0–44)
AST: 33 U/L (ref 15–41)
Albumin: 3.9 g/dL (ref 3.5–5.0)
Alkaline Phosphatase: 32 U/L — ABNORMAL LOW (ref 38–126)
Bilirubin, Direct: <0.1 mg/dL (ref 0.0–0.2)
Total Bilirubin: 0.6 mg/dL (ref 0.3–1.2)
Total Protein: 6.2 g/dL — ABNORMAL LOW (ref 6.5–8.1)

## 2021-10-25 LAB — BASIC METABOLIC PANEL WITH GFR
Anion gap: 10 (ref 5–15)
BUN: 23 mg/dL (ref 8–23)
CO2: 24 mmol/L (ref 22–32)
Calcium: 10.7 mg/dL — ABNORMAL HIGH (ref 8.9–10.3)
Chloride: 105 mmol/L (ref 98–111)
Creatinine, Ser: 1.19 mg/dL — ABNORMAL HIGH (ref 0.44–1.00)
GFR, Estimated: 47 mL/min — ABNORMAL LOW
Glucose, Bld: 120 mg/dL — ABNORMAL HIGH (ref 70–99)
Potassium: 3.7 mmol/L (ref 3.5–5.1)
Sodium: 139 mmol/L (ref 135–145)

## 2021-10-25 LAB — CBC
HCT: 42.9 % (ref 36.0–46.0)
Hemoglobin: 14.7 g/dL (ref 12.0–15.0)
MCH: 29.1 pg (ref 26.0–34.0)
MCHC: 34.3 g/dL (ref 30.0–36.0)
MCV: 85 fL (ref 80.0–100.0)
Platelets: 149 10*3/uL — ABNORMAL LOW (ref 150–400)
RBC: 5.05 MIL/uL (ref 3.87–5.11)
RDW: 12.4 % (ref 11.5–15.5)
WBC: 8.6 10*3/uL (ref 4.0–10.5)
nRBC: 0 % (ref 0.0–0.2)

## 2021-10-25 LAB — POCT ACTIVATED CLOTTING TIME
Activated Clotting Time: 353 s
Activated Clotting Time: 359 s
Activated Clotting Time: 311 s

## 2021-10-25 LAB — LIPID PANEL
Cholesterol: 177 mg/dL (ref 0–200)
HDL: 40 mg/dL — ABNORMAL LOW
LDL Cholesterol: 108 mg/dL — ABNORMAL HIGH (ref 0–99)
Total CHOL/HDL Ratio: 4.4 ratio
Triglycerides: 143 mg/dL
VLDL: 29 mg/dL (ref 0–40)

## 2021-10-25 LAB — HEPARIN LEVEL (UNFRACTIONATED): Heparin Unfractionated: 0.66 [IU]/mL (ref 0.30–0.70)

## 2021-10-25 SURGERY — LEFT HEART CATH AND CORONARY ANGIOGRAPHY
Anesthesia: Moderate Sedation

## 2021-10-25 MED ORDER — SODIUM CHLORIDE 0.9 % IV SOLN: 250.0000 mL | INTRAVENOUS | Status: DC | PRN

## 2021-10-25 MED ORDER — HYDRALAZINE HCL 20 MG/ML IJ SOLN
INTRAMUSCULAR | Status: DC | PRN
  Administered 2021-10-25: 10 mg via INTRAVENOUS

## 2021-10-25 MED ORDER — LABETALOL HCL 5 MG/ML IV SOLN: 10.0000 mg | INTRAVENOUS | Status: AC | PRN

## 2021-10-25 MED ORDER — IOHEXOL 300 MG/ML  SOLN
INTRAMUSCULAR | Status: DC | PRN
  Administered 2021-10-25: 170 mL

## 2021-10-25 MED ORDER — VERAPAMIL HCL 2.5 MG/ML IV SOLN
INTRAVENOUS | Status: AC
  Filled 2021-10-25: qty 2

## 2021-10-25 MED ORDER — SODIUM CHLORIDE 0.9 % WEIGHT BASED INFUSION
1.0000 mL/kg/h | INTRAVENOUS | Status: DC
  Administered 2021-10-25: 1 mL/kg/h via INTRAVENOUS

## 2021-10-25 MED ORDER — HEPARIN (PORCINE) IN NACL 1000-0.9 UT/500ML-% IV SOLN
INTRAVENOUS | Status: AC
  Filled 2021-10-25: qty 1000

## 2021-10-25 MED ORDER — MIDAZOLAM HCL 2 MG/2ML IJ SOLN
INTRAMUSCULAR | Status: AC
  Filled 2021-10-25: qty 2

## 2021-10-25 MED ORDER — HEPARIN SODIUM (PORCINE) 1000 UNIT/ML IJ SOLN
INTRAMUSCULAR | Status: AC
  Filled 2021-10-25: qty 10

## 2021-10-25 MED ORDER — SODIUM CHLORIDE 0.9% FLUSH: 3.0000 mL | INTRAVENOUS | Status: DC | PRN

## 2021-10-25 MED ORDER — CLOPIDOGREL BISULFATE 75 MG PO TABS
ORAL_TABLET | ORAL | Status: DC | PRN
  Administered 2021-10-25: 600 mg via ORAL

## 2021-10-25 MED ORDER — LIDOCAINE HCL (PF) 1 % IJ SOLN
INTRAMUSCULAR | Status: DC | PRN
  Administered 2021-10-25: 2 mL

## 2021-10-25 MED ORDER — FENTANYL CITRATE (PF) 100 MCG/2ML IJ SOLN
INTRAMUSCULAR | Status: DC | PRN
  Administered 2021-10-25 (×2): 25 ug via INTRAVENOUS

## 2021-10-25 MED ORDER — VERAPAMIL HCL 2.5 MG/ML IV SOLN
INTRAVENOUS | Status: DC | PRN
  Administered 2021-10-25: 2.5 mg via INTRA_ARTERIAL

## 2021-10-25 MED ORDER — HYDRALAZINE HCL 20 MG/ML IJ SOLN: 10.0000 mg | INTRAMUSCULAR | Status: AC | PRN

## 2021-10-25 MED ORDER — HYDRALAZINE HCL 20 MG/ML IJ SOLN
INTRAMUSCULAR | Status: AC
  Filled 2021-10-25: qty 1

## 2021-10-25 MED ORDER — LIDOCAINE HCL 1 % IJ SOLN
INTRAMUSCULAR | Status: AC
  Filled 2021-10-25: qty 20

## 2021-10-25 MED ORDER — CLOPIDOGREL BISULFATE 75 MG PO TABS
75.0000 mg | ORAL_TABLET | Freq: Every day | ORAL | Status: DC
  Administered 2021-10-26: 75 mg via ORAL
  Filled 2021-10-25: qty 1

## 2021-10-25 MED ORDER — ASPIRIN 81 MG PO CHEW
81.0000 mg | CHEWABLE_TABLET | ORAL | Status: AC
  Administered 2021-10-25: 81 mg via ORAL
  Filled 2021-10-25: qty 1

## 2021-10-25 MED ORDER — NITROGLYCERIN 1 MG/10 ML FOR IR/CATH LAB
INTRA_ARTERIAL | Status: AC
  Filled 2021-10-25: qty 10

## 2021-10-25 MED ORDER — FENTANYL CITRATE (PF) 100 MCG/2ML IJ SOLN
INTRAMUSCULAR | Status: AC
  Filled 2021-10-25: qty 2

## 2021-10-25 MED ORDER — CLOPIDOGREL BISULFATE 75 MG PO TABS
ORAL_TABLET | ORAL | Status: AC
  Filled 2021-10-25: qty 8

## 2021-10-25 MED ORDER — SODIUM CHLORIDE 0.9 % IV SOLN: INTRAVENOUS | Status: AC

## 2021-10-25 MED ORDER — MIDAZOLAM HCL 2 MG/2ML IJ SOLN
INTRAMUSCULAR | Status: DC | PRN
  Administered 2021-10-25 (×2): 1 mg via INTRAVENOUS

## 2021-10-25 MED ORDER — HEPARIN (PORCINE) IN NACL 2000-0.9 UNIT/L-% IV SOLN
INTRAVENOUS | Status: DC | PRN
  Administered 2021-10-25: 500 mL
  Administered 2021-10-25: 1000 mL

## 2021-10-25 MED ORDER — ENOXAPARIN SODIUM 40 MG/0.4ML IJ SOSY
40.0000 mg | PREFILLED_SYRINGE | INTRAMUSCULAR | Status: DC
  Administered 2021-10-26: 40 mg via SUBCUTANEOUS
  Filled 2021-10-25: qty 0.4

## 2021-10-25 MED ORDER — SODIUM CHLORIDE 0.9 % WEIGHT BASED INFUSION
3.0000 mL/kg/h | INTRAVENOUS | Status: DC
  Administered 2021-10-25: 3 mL/kg/h via INTRAVENOUS

## 2021-10-25 MED ORDER — HEPARIN SODIUM (PORCINE) 1000 UNIT/ML IJ SOLN
INTRAMUSCULAR | Status: DC | PRN
  Administered 2021-10-25 (×2): 3500 [IU] via INTRAVENOUS

## 2021-10-25 MED ORDER — SODIUM CHLORIDE 0.9% FLUSH
3.0000 mL | Freq: Two times a day (BID) | INTRAVENOUS | Status: DC
  Administered 2021-10-25 – 2021-10-26 (×2): 3 mL via INTRAVENOUS

## 2021-10-25 MED ORDER — NITROGLYCERIN 1 MG/10 ML FOR IR/CATH LAB
INTRA_ARTERIAL | Status: DC | PRN
  Administered 2021-10-25: 200 ug via INTRACORONARY
  Administered 2021-10-25: 150 ug via INTRACORONARY
  Administered 2021-10-25 (×3): 200 ug via INTRACORONARY

## 2021-10-25 SURGICAL SUPPLY — 22 items
BALLN MINITREK RX 2.0X8 (BALLOONS) ×3
BALLN TREK RX 2.25X20 (BALLOONS) ×3
BALLN ~~LOC~~ TREK RX 2.25X8 (BALLOONS) ×3
BALLN ~~LOC~~ TREK RX 2.75X15 (BALLOONS) ×3
BALLN ~~LOC~~ TREK RX 3.0X12 (BALLOONS) ×3
CATH 5F 110X4 TIG (CATHETERS) ×3
CATH INFINITI JR4 5F (CATHETERS) ×3
CATH LAUNCHER 5F JR4 (CATHETERS) ×3
CATH LAUNCHER 6FR EBU 3 (CATHETERS) ×3
DEVICE RAD TR BAND REGULAR (VASCULAR PRODUCTS) ×3
DRAPE BRACHIAL (DRAPES) ×3
GLIDESHEATH SLEND SS 6F .021 (SHEATH) ×3
GUIDEWIRE PRESS OMNI 185 ST (WIRE) ×3
INQWIRE 1.5J .035X260CM (WIRE) ×3
KIT ENCORE 26 ADVANTAGE (KITS) ×3
PACK CARDIAC CATH (CUSTOM PROCEDURE TRAY) ×3
PROTECTION STATION PRESSURIZED (MISCELLANEOUS) ×3
SET ATX SIMPLICITY (MISCELLANEOUS) ×3
STENT ONYX FRONTIER 2.5X15 (Permanent Stent) ×3 IMPLANT
STENT ONYX FRONTIER 2.5X30 (Permanent Stent) ×3 IMPLANT
WIRE HITORQ VERSACORE ST 145CM (WIRE) ×3
WIRE RUNTHROUGH .014X180CM (WIRE) ×3

## 2021-10-25 NOTE — Telephone Encounter (Signed)
Patient was admitted to the hospital labs were done. Her son called to get the names of the labs to be drawn by this office while she was in the hospital. He lab appointment has been canceled. ?

## 2021-10-25 NOTE — Progress Notes (Signed)
ANTICOAGULATION CONSULT NOTE  ? ?Pharmacy Consult for Heparin drip ?Indication: chest pain/ACS ? ?Allergies  ?Allergen Reactions  ? Singulair [Montelukast] Itching  ? Lipitor [Atorvastatin] Other (See Comments)  ?  Myalgia, arthralgia  ? Simvastatin Other (See Comments)  ?  myalgia  ? ? ?Patient Measurements: ?Height: 5' (152.4 cm) ?Weight: 72.6 kg (160 lb) ?IBW/kg (Calculated) : 45.5 ?Heparin Dosing Weight: 61.6 kg ? ?Vital Signs: ?Temp: 98.2 ?F (36.8 ?C) (03/07 0013) ?Temp Source: Oral (03/06 2000) ?BP: 127/57 (03/07 0013) ?Pulse Rate: 66 (03/07 0013) ? ?Labs: ?Recent Labs  ?  10/24/21 ?0626 10/24/21 ?0822 10/24/21 ?1126 10/24/21 ?1400 10/24/21 ?1800 10/25/21 ?0249  ?HGB 15.6*  --   --   --   --  14.7  ?HCT 46.5*  --   --   --   --  42.9  ?PLT 150  --   --   --   --  149*  ?APTT 28  --   --   --   --   --   ?LABPROT 13.0  --   --   --   --   --   ?INR 1.0  --   --   --   --   --   ?HEPARINUNFRC  --   --   --   --  0.18* 0.66  ?CREATININE 1.17*  --   --   --   --   --   ?TROPONINIHS 182* 1,282* 1,424* 1,585*  --   --   ? ? ? ?Estimated Creatinine Clearance: 35.2 mL/min (A) (by C-G formula based on SCr of 1.17 mg/dL (H)). ? ? ?Medical History: ?Past Medical History:  ?Diagnosis Date  ? Allergy   ? hay fever, fall allergies  ? Arthritis   ? Asthma   ? as a child, dad was smoker  ? Breast cancer ([place]) 05/08/2017  ? 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  ? Cataract   ? Bil  ? Chicken pox   ? Chronic kidney disease   ? stage 3  ? Colon polyp   ? Constipation   ? Hyperlipidemia   ? Hypothyroidism   ? Palpitations   ? over 20 yeras ago  ? Personal history of radiation therapy   ? Thyroid disease   ? ? ?Medications:  ?Scheduled:  ? acebutolol  200 mg Oral QHS  ? acetaminophen  1,000 mg Oral QHS  ? amLODipine  5 mg Oral Daily  ? aspirin EC  81 mg Oral Daily  ? ezetimibe  10 mg Oral Daily  ? letrozole  2.5 mg Oral Daily  ? levothyroxine  75 mcg Oral Q0600  ?  multivitamin with minerals  1 tablet Oral Daily  ? polyethylene glycol  17 g Oral Daily  ? sodium chloride flush  3 mL Intravenous Q12H  ? vitamin B-12  1,000 mcg Oral Daily  ? ?Infusions:  ? heparin 950 Units/hr (10/24/21 1854)  ? methocarbamol (ROBAXIN) IV    ? ? ?Assessment: ?81 yo F to start Heparin drip for ACS/STEMI. ?No anticoagulants PTA per Med Rec ?Hgb 15.6  plt 150  aPTT 28   INR 1.0 ? ?3/6@1800 : HL 0.18, subtherapeutic ?3/7@0249 : HL 0.66, therapeutic x 1 ? ?Goal of Therapy:  ?Heparin level 0.3-0.7 units/ml ?Monitor platelets by anticoagulation protocol: Yes ?  ?Plan:  ?Continue heparin infusion at 950 units/hr ?Recheck HL in 8 hrs to confirm, then daily ?Continue to monitor H&H and platelets ? ?  Renda Rolls, PharmD, MBA ?10/25/2021 ?3:21 AM ? ? ? ?

## 2021-10-25 NOTE — Progress Notes (Signed)
Progress Note    [name]  YOV:[id number] DOB: 1940/10/30  DOA: 10/24/2021 PCP: Leone Haven, MD      Brief Narrative:    Medical records reviewed and are as summarized below:  Gwendolyn King is a 81 y.o. 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  She was found to have acute NSTEMI.  She was treated with IV heparin infusion.  She underwent left heart catheterization which showed and coronary stent was placed to mid LAD which was 99% stenosed.       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   Acute NSTEMI: S/p left heart cath with placement of coronary stent to mid LAD-99% stenosed.  Continue aspirin and Plavix for at least 12 months.  Continue antihypertensives.  She is statin intolerant.  Mild hypercalcemia: Repeat BMP tomorrow  Other comorbidities include hypertension, hypothyroidism, CKD stage IIIa, papillary carcinoma in situ of right breast     Diet Order             Diet Heart Room service appropriate? Yes; Fluid consistency: Thin  Diet effective now                       Consultants: Cardiologist  Procedures: S/p left heart cath with coronary stent to mid LAD    Medications:    acebutolol  200 mg Oral QHS   acetaminophen  1,000 mg Oral QHS   amLODipine  5 mg Oral Daily   aspirin EC  81 mg Oral Daily   [START ON 10/26/2021] clopidogrel  75 mg Oral Q breakfast   [START ON 10/26/2021] enoxaparin (LOVENOX) injection  40 mg Subcutaneous Q24H   ezetimibe  10 mg  Oral Daily   letrozole  2.5 mg Oral Daily   levothyroxine  75 mcg Oral Q0600   multivitamin with minerals  1 tablet Oral Daily   polyethylene glycol  17 g Oral Daily   sodium chloride flush  3 mL Intravenous Q12H   sodium chloride flush  3 mL Intravenous Q12H   vitamin B-12  1,000 mcg Oral Daily   Continuous Infusions:  sodium chloride     methocarbamol (ROBAXIN) IV       Anti-infectives (From admission, onward)    None              Family Communication/Anticipated D/C date and plan/Code Status   DVT prophylaxis: enoxaparin (LOVENOX) injection 40 mg Start: 10/26/21 0800     Code Status: Full Code  Family Communication: Plan discussed with Tonya, daughter, at the bedside Disposition Plan: Plan to discharge home tomorrow   Status is: Inpatient Remains inpatient appropriate because: Acute NSTEMI s/p coronary stent placement       Subjective:   No chest pain or shortness of breath.  She just came back from the Cath Lab  Objective:    Vitals:   10/25/21 1145 10/25/21 1300 10/25/21 1315 10/25/21 1519  BP: (!) 175/55 (!) 145/76  (!) 151/67  Pulse: 74 83  67  Resp: 17 10  10 15  Temp:    98.2 F (36.8 C)  TempSrc:      SpO2: 100% 99%  100%  Weight:      Height:       No data found.   Intake/Output Summary (Last 24 hours) at 10/25/2021 1658 Last data filed at 10/25/2021 0737 Gross per 24 hour  Intake 203.97 ml  Output --  Net 203.97 ml   Filed Weights   10/24/21 0752 10/25/21 0909  Weight: 72.6 kg 72.6 kg    Exam:  GEN: NAD SKIN: No rash.  No bleeding from left wrist catheterization site EYES: EOMI ENT: MMM CV: RRR PULM: CTA B ABD: soft, ND, NT, +BS CNS: AAO x 3, non focal EXT: No edema or tenderness         Data Reviewed:   I have personally reviewed following labs and imaging studies:  Labs: Labs show the following:   Basic Metabolic Panel: Recent Labs  Lab 10/24/21 0626 10/24/21 0822 10/25/21 0249  NA 142  --  139   K 3.7  --  3.7  CL 104  --  105  CO2 27  --  24  GLUCOSE 115*  --  120*  BUN 31*  --  23  CREATININE 1.17*  --  1.19*  CALCIUM 12.1*  --  10.7*  MG  --  2.0  --    GFR Estimated Creatinine Clearance: 38.9 mL/min (A) (by C-G formula based on SCr of 1.19 mg/dL (H)). Liver Function Tests: Recent Labs  Lab 10/24/21 0626  AST 20  ALT 17  ALKPHOS 30*  BILITOT 0.7  PROT 7.1  ALBUMIN 3.8   No results for input(s): LIPASE, AMYLASE in the last 168 hours. No results for input(s): AMMONIA in the last 168 hours. Coagulation profile Recent Labs  Lab 10/24/21 0626  INR 1.0    CBC: Recent Labs  Lab 10/24/21 0626 10/25/21 0249  WBC 7.6 8.6  NEUTROABS 2.8  --   HGB 15.6* 14.7  HCT 46.5* 42.9  MCV 86.0 85.0  PLT 150 149*   Cardiac Enzymes: No results for input(s): CKTOTAL, CKMB, CKMBINDEX, TROPONINI in the last 168 hours. BNP (last 3 results) No results for input(s): PROBNP in the last 8760 hours. CBG: Recent Labs  Lab 10/25/21 0737  GLUCAP 91   D-Dimer: No results for input(s): DDIMER in the last 72 hours. Hgb A1c: No results for input(s): HGBA1C in the last 72 hours. Lipid Profile: Recent Labs    10/25/21 0249  CHOL 177  HDL 40*  LDLCALC 108*  TRIG 143  CHOLHDL 4.4   Thyroid function studies: No results for input(s): TSH, T4TOTAL, T3FREE, THYROIDAB in the last 72 hours.  Invalid input(s): FREET3 Anemia work up: No results for input(s): VITAMINB12, FOLATE, FERRITIN, TIBC, IRON, RETICCTPCT in the last 72 hours. Sepsis Labs: Recent Labs  Lab 10/24/21 0626 10/25/21 0249  WBC 7.6 8.6    Microbiology Recent Results (from the past 240 hour(s))  Resp Panel by RT-PCR (Flu A&B, Covid) Nasopharyngeal Swab     Status: None   Collection Time: 10/24/21  9:00 AM   Specimen: Nasopharyngeal Swab; Nasopharyngeal(NP) swabs in vial transport medium  Result Value Ref Range Status   SARS Coronavirus 2 by RT PCR NEGATIVE NEGATIVE Final    Comment: (NOTE) SARS-CoV-2  target nucleic acids are NOT DETECTED.  The SARS-CoV-2 RNA is generally detectable in upper respiratory specimens during the acute phase of infection. The lowest concentration of SARS-CoV-2 viral copies  this assay can detect is 138 copies/mL. A negative result does not preclude SARS-Cov-2 infection and should not be used as the sole basis for treatment or other patient management decisions. A negative result may occur with  improper specimen collection/handling, submission of specimen other than nasopharyngeal swab, presence of viral mutation(s) within the areas targeted by this assay, and inadequate number of viral copies(<138 copies/mL). A negative result must be combined with clinical observations, patient history, and epidemiological information. The expected result is Negative.  Fact Sheet for Patients:  EntrepreneurPulse.com.au  Fact Sheet for Healthcare Providers:  IncredibleEmployment.be  This test is no t yet approved or cleared by the [place] FDA and  has been authorized for detection and/or diagnosis of SARS-CoV-2 by FDA under an Emergency Use Authorization (EUA). This EUA will remain  in effect (meaning this test can be used) for the duration of the COVID-19 declaration under Section 564(b)(1) of the Act, 21 U.S.C.section 360bbb-3(b)(1), unless the authorization is terminated  or revoked sooner.       Influenza A by PCR NEGATIVE NEGATIVE Final   Influenza B by PCR NEGATIVE NEGATIVE Final    Comment: (NOTE) The Xpert Xpress SARS-CoV-2/FLU/RSV plus assay is intended as an aid in the diagnosis of influenza from Nasopharyngeal swab specimens and should not be used as a sole basis for treatment. Nasal washings and aspirates are unacceptable for Xpert Xpress SARS-CoV-2/FLU/RSV testing.  Fact Sheet for Patients: EntrepreneurPulse.com.au  Fact Sheet for Healthcare  Providers: IncredibleEmployment.be  This test is not yet approved or cleared by the [place] FDA and has been authorized for detection and/or diagnosis of SARS-CoV-2 by FDA under an Emergency Use Authorization (EUA). This EUA will remain in effect (meaning this test can be used) for the duration of the COVID-19 declaration under Section 564(b)(1) of the Act, 21 U.S.C. section 360bbb-3(b)(1), unless the authorization is terminated or revoked.  Performed at [facility], [address] [place], [place] [address]     Procedures and diagnostic studies:  DG Chest Portable 1 View  Result Date: 10/24/2021 CLINICAL DATA:  80 year old female with chest pressure and shortness of breath. EXAM: PORTABLE CHEST 1 VIEW COMPARISON:  Chest radiographs 12/12/2017. FINDINGS: Portable AP upright view at 0644 hours. Lung volumes and mediastinal contours remain normal. Visualized tracheal air column is within normal limits. Allowing for portable technique chronic appearing increased interstitial markings in 2019 remain stable. No pneumothorax, pulmonary edema, pleural effusion or confluent pulmonary opacity. Mild levoconvex lower thoracic scoliosis. No acute osseous abnormality identified. Negative visible bowel gas. IMPRESSION: Chronic pulmonary interstitial changes. No acute cardiopulmonary abnormality. Electronically Signed   By: Genevie Ann M.D.   On: 10/24/2021 06:55   ECHOCARDIOGRAM COMPLETE  Result Date: 10/24/2021    ECHOCARDIOGRAM REPORT   Patient Name:   Gwendolyn King Boettner Date of Exam: 10/24/2021 Medical Rec #:  [id number]     Height:       60.0 in Accession #:    [id number]    Weight:       160.0 lb Date of Birth:  [birth year]     BSA:          1.698 m Patient Age:    79 years      BP:           137/62 mmHg Patient Gender: F             HR:           64 bpm. Exam Location:  [facility] Procedure:  2D Echo, Cardiac Doppler and Color Doppler Indications:     NSTEMI I21.4  History:          Patient has no prior history of Echocardiogram examinations.                  Risk Factors:Dyslipidemia. Breast cancer, radiation therapy.  Sonographer:     Sherrie Sport Referring Phys:  [place] Diagnosing Phys: Kathlyn Sacramento MD  Sonographer Comments: Suboptimal apical window and suboptimal parasternal window. IMPRESSIONS  1. Left ventricular ejection fraction, by estimation, is 35 to 40%. The left ventricle has moderately decreased function. The left ventricle has no regional wall motion abnormalities. There is moderate asymmetric left ventricular hypertrophy of the basal-septal segment. Left ventricular diastolic parameters are consistent with Grade I diastolic dysfunction (impaired relaxation). There is severe hypokinesis of the left ventricular, mid-apical anterior wall and apical segment. Wall motion abnormalities are suggestive of an LAD infact and less likely stress induced cardiomyopathy.  2. Right ventricular systolic function is normal. The right ventricular size is normal.  3. The mitral valve is normal in structure. No evidence of mitral valve regurgitation. No evidence of mitral stenosis.  4. The aortic valve is normal in structure. Aortic valve regurgitation is trivial. Aortic valve sclerosis/calcification is present, without any evidence of aortic stenosis. FINDINGS  Left Ventricle: Left ventricular ejection fraction, by estimation, is 35 to 40%. The left ventricle has moderately decreased function. The left ventricle has no regional wall motion abnormalities. Severe hypokinesis of the left ventricular, mid-apical anterior wall and apical segment. The left ventricular internal cavity size was normal in size. There is moderate asymmetric left ventricular hypertrophy of the basal-septal segment. Left ventricular diastolic parameters are consistent with Grade I diastolic dysfunction (impaired relaxation). Right Ventricle: The right ventricular size is normal. No increase in right  ventricular wall thickness. Right ventricular systolic function is normal. Left Atrium: Left atrial size was normal in size. Right Atrium: Right atrial size was normal in size. Pericardium: There is no evidence of pericardial effusion. Mitral Valve: The mitral valve is normal in structure. No evidence of mitral valve regurgitation. No evidence of mitral valve stenosis. MV peak gradient, 3.7 mmHg. The mean mitral valve gradient is 1.0 mmHg. Tricuspid Valve: The tricuspid valve is normal in structure. Tricuspid valve regurgitation is not demonstrated. No evidence of tricuspid stenosis. Aortic Valve: The aortic valve is normal in structure. Aortic valve regurgitation is trivial. Aortic valve sclerosis/calcification is present, without any evidence of aortic stenosis. Aortic valve mean gradient measures 2.5 mmHg. Aortic valve peak gradient measures 3.6 mmHg. Aortic valve area, by VTI measures 2.89 cm. Pulmonic Valve: The pulmonic valve was normal in structure. Pulmonic valve regurgitation is not visualized. No evidence of pulmonic stenosis. Aorta: The aortic root is normal in size and structure. Venous: The inferior vena cava was not well visualized. IAS/Shunts: No atrial level shunt detected by color flow Doppler.  LEFT VENTRICLE PLAX 2D LVIDd:         3.60 cm     Diastology LVIDs:         2.70 cm     LV e' medial:    3.81 cm/s LV PW:         1.10 cm     LV E/e' medial:  16.3 LV IVS:        1.15 cm     LV e' lateral:   3.92 cm/s LVOT diam:     2.00 cm     LV  E/e' lateral: 15.8 LV SV:         65 LV SV Index:   38 LVOT Area:     3.14 cm  LV Volumes (MOD) LV vol d, MOD A2C: 31.7 ml LV vol d, MOD A4C: 36.9 ml LV vol s, MOD A2C: 24.7 ml LV vol s, MOD A4C: 22.6 ml LV SV MOD A2C:     7.0 ml LV SV MOD A4C:     36.9 ml LV SV MOD BP:      9.7 ml RIGHT VENTRICLE RV Basal diam:  3.10 cm RV S prime:     8.92 cm/s TAPSE (M-mode): 1.8 cm LEFT ATRIUM             Index        RIGHT ATRIUM          Index LA diam:        3.50 cm 2.06  cm/m   RA Area:     9.87 cm LA Vol (A2C):   17.3 ml 10.19 ml/m  RA Volume:   18.50 ml 10.90 ml/m LA Vol (A4C):   13.7 ml 8.07 ml/m LA Biplane Vol: 15.3 ml 9.01 ml/m  AORTIC VALVE                    PULMONIC VALVE AV Area (Vmax):    2.78 cm     PV Vmax:        0.55 m/s AV Area (Vmean):   2.56 cm     PV Vmean:       38.800 cm/s AV Area (VTI):     2.89 cm     PV VTI:         0.096 m AV Vmax:           95.20 cm/s   PV Peak grad:   1.2 mmHg AV Vmean:          71.550 cm/s  PV Mean grad:   1.0 mmHg AV VTI:            0.224 m      RVOT Peak grad: 4 mmHg AV Peak Grad:      3.6 mmHg AV Mean Grad:      2.5 mmHg LVOT Vmax:         84.20 cm/s LVOT Vmean:        58.300 cm/s LVOT VTI:          0.206 m LVOT/AV VTI ratio: 0.92  AORTA Ao Root diam: 2.83 cm MITRAL VALVE               TRICUSPID VALVE MV Area (PHT): 5.20 cm    TR Peak grad:   10.2 mmHg MV Area VTI:   2.80 cm    TR Vmax:        160.00 cm/s MV Peak grad:  3.7 mmHg MV Mean grad:  1.0 mmHg    SHUNTS MV Vmax:       0.96 m/s    Systemic VTI:  0.21 m MV Vmean:      44.3 cm/s   Systemic Diam: 2.00 cm MV Decel Time: 146 msec    Pulmonic VTI:  0.207 m MV E velocity: 62.10 cm/s MV A velocity: 77.10 cm/s MV E/A ratio:  0.81 Kathlyn Sacramento MD Electronically signed by Kathlyn Sacramento MD Signature Date/Time: 10/24/2021/1:52:50 PM    Final                LOS: 1 day  Abagael Kramm  [employer], advertising on [email or web address]. If 7PM-7AM, please contact night-coverage at [email or web address]     10/25/2021, 4:58 PM

## 2021-10-25 NOTE — Brief Op Note (Signed)
BRIEF CARDIAC CATHETERIZATION NOTE ? ?10/25/2021 ? ?11:23 AM ? ?PATIENT:  Gwendolyn King  81 y.o. female ? ?PRE-OPERATIVE DIAGNOSIS:  NSTEMI ? ?POST-OPERATIVE DIAGNOSIS:  NSTEMI ? ?PROCEDURE:  Procedure(s): ?LEFT HEART CATH AND CORONARY ANGIOGRAPHY (N/A) ?INTRAVASCULAR PRESSURE WIRE/FFR STUDY (N/A) ?CORONARY STENT INTERVENTION (N/A) ? ?SURGEON:  Surgeon(s) and Role: ?   Nelva Bush, MD - Primary ? ?FINDINGS: ?Severe 2-vessel CAD includinig 60% ostial LAD, 61 mid LAD, and 80% mid LCx disease.  LCx disease is significant by iFR (0.68). ?Moderate ostial through mid RCA disease with element of vasospasm that is not significant by iFR (0.94). ?Mildly elevated left ventricular filling pressure. ?Successful PCI to mid LAD using Onyx Frontier 2.5 x 15 mm DES (post-dilated to 3.1 mm proximally) with 0% residual stenosis and TIMI-3 flow. ?Successful PCI to proximal/mid LCx using Onyx Frontier 2.5 x 30 mm DES (postdilated to 2.8 mm) with 0% residual stenosis and TIMI-3 flow. ? ?RECOMMENDATIONS: ?DAPT with ASA and clopidogrel for at least 12 months. ?Aggressive secondary prevention. ?Escalation of GDMT for ischemic cardiomyopathy, as tolerated. ? ?Nelva Bush, MD ?[facility] ? ?

## 2021-10-25 NOTE — Interval H&P Note (Signed)
History and Physical Interval Note: ? ?10/25/2021 ?9:35 AM ? ?SERENIDY WALTZ  has presented today for surgery, with the diagnosis of NSTEMI.  The various methods of treatment have been discussed with the patient and family. After consideration of risks, benefits and other options for treatment, the patient has consented to  Procedure(s): ?LEFT HEART CATH AND CORONARY ANGIOGRAPHY (N/A) as a surgical intervention.  The patient's history has been reviewed, patient examined, no change in status, stable for surgery.  I have reviewed the patient's chart and labs.  Questions were answered to the patient's satisfaction.   ? ?Cath Lab Visit (complete for each Cath Lab visit) ? ?Clinical Evaluation Leading to the Procedure:  ? ?ACS: Yes.   ? ?Non-ACS:  N/A ? ?Katherine Syme ? ? ?

## 2021-10-26 ENCOUNTER — Encounter: Payer: Self-pay | Admitting: Family Medicine

## 2021-10-26 ENCOUNTER — Encounter: Payer: Self-pay | Admitting: Internal Medicine

## 2021-10-26 DIAGNOSIS — N1831 Chronic kidney disease, stage 3a: Secondary | ICD-10-CM

## 2021-10-26 DIAGNOSIS — I255 Ischemic cardiomyopathy: Secondary | ICD-10-CM

## 2021-10-26 DIAGNOSIS — I10 Essential (primary) hypertension: Secondary | ICD-10-CM

## 2021-10-26 LAB — CBC
HCT: 44.2 % (ref 36.0–46.0)
Hemoglobin: 14.9 g/dL (ref 12.0–15.0)
MCH: 28.8 pg (ref 26.0–34.0)
MCHC: 33.7 g/dL (ref 30.0–36.0)
MCV: 85.5 fL (ref 80.0–100.0)
Platelets: 143 10*3/uL — ABNORMAL LOW (ref 150–400)
RBC: 5.17 MIL/uL — ABNORMAL HIGH (ref 3.87–5.11)
RDW: 12.7 % (ref 11.5–15.5)
WBC: 8.9 10*3/uL (ref 4.0–10.5)
nRBC: 0 % (ref 0.0–0.2)

## 2021-10-26 LAB — BASIC METABOLIC PANEL WITH GFR
Anion gap: 8 (ref 5–15)
BUN: 18 mg/dL (ref 8–23)
CO2: 24 mmol/L (ref 22–32)
Calcium: 10.2 mg/dL (ref 8.9–10.3)
Chloride: 109 mmol/L (ref 98–111)
Creatinine, Ser: 1.22 mg/dL — ABNORMAL HIGH (ref 0.44–1.00)
GFR, Estimated: 45 mL/min — ABNORMAL LOW
Glucose, Bld: 111 mg/dL — ABNORMAL HIGH (ref 70–99)
Potassium: 3.8 mmol/L (ref 3.5–5.1)
Sodium: 141 mmol/L (ref 135–145)

## 2021-10-26 MED ORDER — CLOPIDOGREL BISULFATE 75 MG PO TABS: 75.0000 mg | ORAL_TABLET | Freq: Every day | ORAL | 0 refills | Status: DC

## 2021-10-26 MED ORDER — AMLODIPINE BESYLATE 5 MG PO TABS: 5.0000 mg | ORAL_TABLET | Freq: Every day | ORAL | 0 refills | Status: DC

## 2021-10-26 NOTE — Plan of Care (Signed)

## 2021-10-26 NOTE — Progress Notes (Signed)
? ?Progress Note ? ?Patient Name: Gwendolyn King ?Date of Encounter: 10/26/2021 ? ?[facility] Cardiologist: None  ? ?Subjective  ? ?Denies chest pain or shortness of breath. ? ?Inpatient Medications  ?  ?Scheduled Meds: ? acebutolol  200 mg Oral QHS  ? acetaminophen  1,000 mg Oral QHS  ? amLODipine  5 mg Oral Daily  ? aspirin EC  81 mg Oral Daily  ? clopidogrel  75 mg Oral Q breakfast  ? enoxaparin (LOVENOX) injection  40 mg Subcutaneous Q24H  ? ezetimibe  10 mg Oral Daily  ? letrozole  2.5 mg Oral Daily  ? levothyroxine  75 mcg Oral Q0600  ? multivitamin with minerals  1 tablet Oral Daily  ? polyethylene glycol  17 g Oral Daily  ? sodium chloride flush  3 mL Intravenous Q12H  ? sodium chloride flush  3 mL Intravenous Q12H  ? vitamin B-12  1,000 mcg Oral Daily  ? ?Continuous Infusions: ? sodium chloride    ? methocarbamol (ROBAXIN) IV    ? ?PRN Meds: ?sodium chloride, acetaminophen, ketotifen, methocarbamol (ROBAXIN) IV, nitroGLYCERIN, ondansetron (ZOFRAN) IV, sodium chloride flush  ? ?Vital Signs  ?  ?Vitals:  ? 10/26/21 0305 10/26/21 0530 10/26/21 0723 10/26/21 1113  ?BP: (!) 126/55  (!) 126/59 (!) 125/54  ?Pulse: 64  63 66  ?Resp: '18  18 18  '$ ?Temp: 97.7 ?F (36.5 ?C)  97.9 ?F (36.6 ?C) 97.8 ?F (36.6 ?C)  ?TempSrc:      ?SpO2: 99%  97% 97%  ?Weight:  70.3 kg    ?Height:      ? ? ?Intake/Output Summary (Last 24 hours) at 10/26/2021 1757 ?Last data filed at 10/26/2021 1408 ?Gross per 24 hour  ?Intake 960 ml  ?Output 1000 ml  ?Net -40 ml  ? ?Last 3 Weights 10/26/2021 10/25/2021 10/24/2021  ?Weight (lbs) 154 lb 14.4 oz 160 lb 160 lb  ?Weight (kg) 70.262 kg 72.576 kg 72.576 kg  ?   ? ?Telemetry  ?  ?Sinus rhythm- Personally Reviewed ? ?ECG  ?  ? - Personally Reviewed ? ?Physical Exam  ? ?GEN: No acute distress.   ?Neck: No JVD ?Cardiac: RRR, no murmurs, rubs, or gallops.  ?Respiratory: Clear to auscultation bilaterally. ?GI: Soft, nontender, non-distended  ?MS: No edema; No deformity. ?Neuro:  Nonfocal  ?Psych: Normal affect   ? ?Labs  ?  ?High Sensitivity Troponin:   ?Recent Labs  ?Lab 10/24/21 ?0626 10/24/21 ?0076 10/24/21 ?1126 10/24/21 ?1400  ?TROPONINIHS 182* 1,282* 1,424* 1,585*  ?   ?Chemistry ?Recent Labs  ?Lab 10/24/21 ?2263 10/24/21 ?3354 10/25/21 ?0249 10/26/21 ?5625  ?NA 142  --  139 141  ?K 3.7  --  3.7 3.8  ?CL 104  --  105 109  ?CO2 27  --  24 24  ?GLUCOSE 115*  --  120* 111*  ?BUN 31*  --  23 18  ?CREATININE 1.17*  --  1.19* 1.22*  ?CALCIUM 12.1*  --  10.7* 10.2  ?MG  --  2.0  --   --   ?PROT 7.1  --  6.2*  --   ?ALBUMIN 3.8  --  3.9  --   ?AST 20  --  33  --   ?ALT 17  --  16  --   ?ALKPHOS 30*  --  32*  --   ?BILITOT 0.7  --  0.6  --   ?GFRNONAA 48*  --  47* 45*  ?ANIONGAP 11  --  10 8  ?  ?  Lipids  ?Recent Labs  ?Lab 10/25/21 ?4008  ?CHOL 177  ?TRIG 143  ?HDL 40*  ?LDLCALC 108*  ?CHOLHDL 4.4  ?  ?Hematology ?Recent Labs  ?Lab 10/24/21 ?0626 10/25/21 ?0249 10/26/21 ?6761  ?WBC 7.6 8.6 8.9  ?RBC 5.41* 5.05 5.17*  ?HGB 15.6* 14.7 14.9  ?HCT 46.5* 42.9 44.2  ?MCV 86.0 85.0 85.5  ?MCH 28.8 29.1 28.8  ?MCHC 33.5 34.3 33.7  ?RDW 12.3 12.4 12.7  ?PLT 150 149* 143*  ? ?Thyroid No results for input(s): TSH, FREET4 in the last 168 hours.  ?BNP ?Recent Labs  ?Lab 10/24/21 ?0626  ?BNP 76.6  ?  ?DDimer No results for input(s): DDIMER in the last 168 hours.  ? ?Radiology  ?  ?CARDIAC CATHETERIZATION ? ?Result Date: 10/25/2021 ?Conclusions: Severe two-vessel CAD includinig 50-60% ostial LAD, 99% mid LAD, and 80% mid LCx disease.  LCx disease is significant by iFR (0.68). Moderate ostial through mid RCA disease with an element of vasospasm that is not significant by iFR (0.94). Mildly elevated left ventricular filling pressure (LVEDP 20-25 mmHg). Successful PCI to mid LAD using Onyx Frontier 2.5 x 15 mm drug-eluting stent (post-dilated to 3.1 mm proximally) with 0% residual stenosis and TIMI-3 flow. Successful iFR-guided PCI to proximal/mid LCx using Onyx Frontier 2.5 x 30 mm drug-eluting stent (postdilated to 2.8 mm) with 0% residual  stenosis and TIMI-3 flow. Recommendations: Dual antiplatelet therapy with aspirin and clopidogrel for at least 12 months. Aggressive secondary prevention. Escalation of goal-directed medical therapy for HFrEF/ischemic cardiomyopathy, as tolerated.  Consider gentle diuresis beginning as soon as tomorrow as renal function allows. Nelva Bush, MD [facility]  ? ?Cardiac Studies  ? ?[facility] 10/27/2021 ?Conclusions: ?Severe two-vessel CAD includinig 50-60% ostial LAD, 99% mid LAD, and 80% mid LCx disease.  LCx disease is significant by iFR (0.68). ?Moderate ostial through mid RCA disease with an element of vasospasm that is not significant by iFR (0.94). ?Mildly elevated left ventricular filling pressure (LVEDP 20-25 mmHg). ?Successful PCI to mid LAD using Onyx Frontier 2.5 x 15 mm drug-eluting stent (post-dilated to 3.1 mm proximally) with 0% residual stenosis and TIMI-3 flow. ?Successful iFR-guided PCI to proximal/mid LCx using Onyx Frontier 2.5 x 30 mm drug-eluting stent (postdilated to 2.8 mm) with 0% residual stenosis and TIMI-3 flow. ?  ?Recommendations: ?Dual antiplatelet therapy with aspirin and clopidogrel for at least 12 months. ?Aggressive secondary prevention. ?Escalation of goal-directed medical therapy for HFrEF/ischemic cardiomyopathy, as tolerated.  Consider gentle diuresis beginning as soon as tomorrow as renal function allows. ? ?Patient Profile  ?   ?81 y.o. female hypertension, obesity, hyperlipidemia statin intolerance presenting with chest pain diagnosed with NSTEMI, underwent left heart cath showing significant stenosis in LAD and left circumflex s/p PCI to mid LAD and mid left circumflex. ? ?Assessment & Plan  ?  ?Chest pain/NSTEMI ?-PCI to mid LAD, PCI to mid left circumflex ?-Aspirin, Plavix, Zetia ?-Statin intolerance ?-PCSK9 as outpatient ? ?2.  Cardiomyopathy EF 35 to 40% ?-Likely ischemic ?-Appears euvolemic ?-Recommend Toprol-XL. ?-Avoiding ACE/ARB/Arni due to renal dysfunction ?-Titration  of GDMT as outpatient-Norvasc can be stopped, hydralazine/Imdur considered. ? ?Total encounter time more than 50 minutes ? Greater than 50% was spent in counseling and coordination of care with the patient ? ?    ?Signed, ?Kate Sable, MD  ?10/26/2021, 5:57 PM    ?

## 2021-10-26 NOTE — Progress Notes (Signed)
?  Transition of Care (TOC) Screening Note ? ? ?Patient Details  ?Name: Gwendolyn King ?Date of Birth: [birth year] ? ? ?Transition of Care (TOC) CM/SW Contact:    ?Alberteen Sam, LCSW ?Phone Number: ?10/26/2021, 9:38 AM ? ? ? ?Transition of Care Department [facility]) has reviewed patient and no TOC needs have been identified at this time. We will continue to monitor patient advancement through interdisciplinary progression rounds. If new patient transition needs arise, please place a TOC consult. ? Neenah Canter, [place] ?[phone] ? ?

## 2021-10-26 NOTE — Progress Notes (Signed)
Patient discharged, all belongings with pt. 

## 2021-10-26 NOTE — Discharge Summary (Signed)
Physician Discharge Summary  Gwendolyn King HAL:[id number] DOB: Jul 05, 1941 DOA: 10/24/2021  PCP: Leone Haven, MD  Admit date: 10/24/2021 Discharge date: 10/26/2021  Admitted From: home  Disposition:  home   Recommendations for Outpatient Follow-up:  Follow up with PCP in 1-2 weeks F/u w/ cardio, Dr. Rockey Situ, in 1-2 weeks   Home Health: no  Equipment/Devices:  Discharge Condition: stable  CODE STATUS: full  Diet recommendation: Heart Healthy  Brief/Interim Summary: HPI was taken from Dr. Francine Graven: Gwendolyn King is a 81 y.o. 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  She rates her pain an 8 out of 10 in intensity at its worst.  She describes it a pressure sensation between her breasts without any radiation.  She denies having any associated nausea, no vomiting, no diaphoresis, no palpitations or shortness of breath.  She had an episode 2 days prior to this admission which occurred with exertion but was short-lived and not as severe.  She had some relief with sublingual nitroglycerin and pain is down to 3 x 10 in intensity. She denies having any abdominal pain, no changes in her bowel habits, no fever, no chills, no cough, no urinary symptoms, no blurred vision, no dizziness, no lightheadedness, no focal deficit.  As per Dr. Mal Misty: Gwendolyn King is a 81 y.o. 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   She was found to have acute NSTEMI.  She was treated with IV heparin infusion.  She underwent left heart  catheterization which showed and coronary stent was placed to mid LAD which was 99% stenosed.   As per Dr. Jimmye Norman 10/26/21: Pt was medically stable from a cardiac standpoint. Pt will continue on aspirin & plavix x 12 months as per cardio. Pt will f/u w/ cardio outpatient, Dr. Rockey Situ, in 1-2 weeks. Pt verbalized her understanding. For more information, please see previous progress/consult notes.    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  Continue aspirin and Plavix for at least 12 months. Cannot tolerate statins.   Mild hypercalcemia: resolved   HTN: continue on amlodipine  Hypothyroidism: continue on home dose of levothyroxine   CKDIIIa: Cr is trending up from day prior.   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 Management per onco as an outpatient    Discharge Instructions  Discharge Instructions     AMB Referral to Cardiac Rehabilitation - Phase II   Complete by: As directed    Diagnosis:  Coronary Stents NSTEMI     After initial evaluation and assessments completed: Virtual Based Care may be provided alone or in conjunction with Phase 2 Cardiac Rehab based on patient barriers.: Yes   Diet - low sodium heart healthy   Complete by: As directed  Discharge instructions   Complete by: As directed    F/u w/ cardio, Dr. Rockey Situ, in 1-2 weeks. F/u w/ PCP in 1-2 weeks   Increase activity slowly   Complete by: As directed       Allergies as of 10/26/2021       Reactions   Singulair [montelukast] Itching   Lipitor [atorvastatin] Other (See Comments)   Myalgia, arthralgia   Simvastatin Other (See Comments)    myalgia        Medication List     TAKE these medications    acebutolol 200 MG capsule Commonly known as: SECTRAL Take 1 capsule (200 mg total) by mouth daily.   acetaminophen 500 MG tablet Commonly known as: TYLENOL Take 1,000 mg by mouth at bedtime.   amLODipine 5 MG tablet Commonly known as: NORVASC Take 1 tablet (5 mg total) by mouth daily. Start taking on: October 27, 2021   aspirin EC 81 MG tablet Take 81 mg by mouth at bedtime.   AZELASTINE HCL OP Place 1 drop into both eyes 3 (three) times daily as needed (dry/irritated eyes.).   Biotin 5 MG Tabs Take 5 mg by mouth daily.   calcium-vitamin D 500-200 MG-UNIT tablet Commonly known as: OSCAL WITH D Take 1 tablet by mouth daily.   cholecalciferol 25 MCG (1000 UNIT) tablet Commonly known as: VITAMIN D Take 1,000 Units by mouth every evening.   clopidogrel 75 MG tablet Commonly known as: PLAVIX Take 1 tablet (75 mg total) by mouth daily with breakfast. Start taking on: October 27, 2021   EPINEPHrine 0.3 mg/0.3 mL Soaj injection Commonly known as: EPI-PEN Inject 0.3 mg into the muscle as needed for anaphylaxis.   ezetimibe 10 MG tablet Commonly known as: ZETIA   Krill Oil 500 MG Caps Take 500 mg by mouth daily.   letrozole 2.5 MG tablet Commonly known as: FEMARA Take 1 tablet (2.5 mg total) by mouth daily.   levocetirizine 5 MG tablet Commonly known as: XYZAL Take 5 mg by mouth every evening.   Levoxyl 75 MCG tablet Generic drug: levothyroxine Take 1 tablet by mouth once daily   multivitamin with minerals Tabs tablet Take 1 tablet by mouth daily. Adult 50+   polyethylene glycol powder 17 GM/SCOOP powder Commonly known as: GLYCOLAX/MIRALAX Take 17 g by mouth daily. With coffee   Repatha 140 MG/ML Sosy Generic drug: Evolocumab Inject 140 mg into the skin every 14 (fourteen) days.   vitamin B-12 1000 MCG tablet Commonly known as: CYANOCOBALAMIN Take 1,000 mcg by mouth daily.         Follow-up Information     Minna Merritts, MD Follow up in 2 week(s).   Specialty: Cardiology Why: F/u w/ cardio in 1-2 weeks Contact information: [place] [address] [phone]                Allergies  Allergen Reactions   Singulair [Montelukast] Itching   Lipitor [Atorvastatin] Other (See Comments)    Myalgia, arthralgia   Simvastatin Other (See Comments)    myalgia    Consultations: Cardio    Procedures/Studies: MR Brain Wo Contrast  Result Date: 10/18/2021 CLINICAL DATA:  Provided history: Leg numbness. Neuro deficit, acute, stroke suspected. Additional history provided by scanning technologist: Patient hit head on side mirror of vehicle 2/26. Episode of confusion and headache beginning 10/11/2021. Right leg numbness. EXAM: MRI HEAD WITHOUT CONTRAST TECHNIQUE: Multiplanar, multiecho pulse sequences of the brain and surrounding structures were obtained  without intravenous contrast. COMPARISON:  Brain MRI 02/18/2018. FINDINGS: Brain: Mild generalized parenchymal volume loss, not unexpected for age. 6 mm acute/early subacute infarct within the left thalamus (series 5, image 22) (series 7, image 18). Minimal multifocal T2 FLAIR hyperintense signal abnormality within the cerebral white matter, nonspecific but compatible with chronic small vessel ischemic disease. Prominent perivascular space within the inferior left basal ganglia. Unchanged 2 mm T2 hyperintense focus within the left cerebellar white matter, also likely reflecting a prominent perivascular space (series 10, image 6). No evidence of an intracranial mass. No chronic intracranial blood products. No extra-axial fluid collection. No midline shift. Vascular: Maintained flow voids within the proximal large arterial vessels. Skull and upper cervical spine: 12 mm focus of signal abnormality within the left parietal calvarium, unchanged as compared to the prior MRI of 02/18/2018 and likely  benign. Subcentimeter T1 hypointense focus within the C2 vertebral body, also unchanged from the prior MRI and presumably benign. Incompletely assessed cervical spondylosis. Sinuses/Orbits: Visualized orbits show no acute finding. Small mucous retention cysts within the left maxillary sinus. Trace mucosal thickening within the bilateral ethmoid and right frontal sinuses. Other: Right atlantoaxial joint effusion. Impression #1 will be called to the ordering clinician or representative by the Radiologist Assistant, and communication documented in the PACS or [employer]. IMPRESSION: 6 mm acute/early subacute infarct within the left thalamus. Minimal chronic small vessel ischemic changes within the cerebral white matter, similar to the prior brain MRI of 02/18/2018. Mild generalized parenchymal volume loss, not unexpected for age. Mild paranasal sinus disease, as described. Incompletely assessed cervical spondylosis. Right atlantoaxial joint effusion. Electronically Signed   By: Kellie Simmering D.O.   On: 10/18/2021 17:24   CARDIAC CATHETERIZATION  Result Date: 10/25/2021 Conclusions: Severe two-vessel CAD includinig 50-60% ostial LAD, 99% mid LAD, and 80% mid LCx disease.  LCx disease is significant by iFR (0.68). Moderate ostial through mid RCA disease with an element of vasospasm that is not significant by iFR (0.94). Mildly elevated left ventricular filling pressure (LVEDP 20-25 mmHg). Successful PCI to mid LAD using Onyx Frontier 2.5 x 15 mm drug-eluting stent (post-dilated to 3.1 mm proximally) with 0% residual stenosis and TIMI-3 flow. Successful iFR-guided PCI to proximal/mid LCx using Onyx Frontier 2.5 x 30 mm drug-eluting stent (postdilated to 2.8 mm) with 0% residual stenosis and TIMI-3 flow. Recommendations: Dual antiplatelet therapy with aspirin and clopidogrel for at least 12 months. Aggressive secondary prevention. Escalation of goal-directed medical therapy for HFrEF/ischemic cardiomyopathy, as  tolerated.  Consider gentle diuresis beginning as soon as tomorrow as renal function allows. Nelva Bush, MD [facility]  DG Chest Portable 1 View  Result Date: 10/24/2021 CLINICAL DATA:  81 year old female with chest pressure and shortness of breath. EXAM: PORTABLE CHEST 1 VIEW COMPARISON:  Chest radiographs 12/12/2017. FINDINGS: Portable AP upright view at 0644 hours. Lung volumes and mediastinal contours remain normal. Visualized tracheal air column is within normal limits. Allowing for portable technique chronic appearing increased interstitial markings in 2019 remain stable. No pneumothorax, pulmonary edema, pleural effusion or confluent pulmonary opacity. Mild levoconvex lower thoracic scoliosis. No acute osseous abnormality identified. Negative visible bowel gas. IMPRESSION: Chronic pulmonary interstitial changes. No acute cardiopulmonary abnormality. Electronically Signed   By: Genevie Ann M.D.   On: 10/24/2021 06:55   ECHOCARDIOGRAM COMPLETE  Result Date: 10/24/2021    ECHOCARDIOGRAM REPORT   Patient Name:   EILENE VOIGT Schlemmer Date of Exam: 10/24/2021 Medical Rec #:  [id number]     Height:  60.0 in Accession #:    [id number]    Weight:       160.0 lb Date of Birth:  [birth year]     BSA:          1.698 m Patient Age:    39 years      BP:           137/62 mmHg Patient Gender: F             HR:           64 bpm. Exam Location:  [facility] Procedure: 2D Echo, Cardiac Doppler and Color Doppler Indications:     NSTEMI I21.4  History:         Patient has no prior history of Echocardiogram examinations.                  Risk Factors:Dyslipidemia. Breast cancer, radiation therapy.  Sonographer:     Sherrie Sport Referring Phys:  [place] Diagnosing Phys: Kathlyn Sacramento MD  Sonographer Comments: Suboptimal apical window and suboptimal parasternal window. IMPRESSIONS  1. Left ventricular ejection fraction, by estimation, is 35 to 40%. The left ventricle has moderately decreased function. The left ventricle  has no regional wall motion abnormalities. There is moderate asymmetric left ventricular hypertrophy of the basal-septal segment. Left ventricular diastolic parameters are consistent with Grade I diastolic dysfunction (impaired relaxation). There is severe hypokinesis of the left ventricular, mid-apical anterior wall and apical segment. Wall motion abnormalities are suggestive of an LAD infact and less likely stress induced cardiomyopathy.  2. Right ventricular systolic function is normal. The right ventricular size is normal.  3. The mitral valve is normal in structure. No evidence of mitral valve regurgitation. No evidence of mitral stenosis.  4. The aortic valve is normal in structure. Aortic valve regurgitation is trivial. Aortic valve sclerosis/calcification is present, without any evidence of aortic stenosis. FINDINGS  Left Ventricle: Left ventricular ejection fraction, by estimation, is 35 to 40%. The left ventricle has moderately decreased function. The left ventricle has no regional wall motion abnormalities. Severe hypokinesis of the left ventricular, mid-apical anterior wall and apical segment. The left ventricular internal cavity size was normal in size. There is moderate asymmetric left ventricular hypertrophy of the basal-septal segment. Left ventricular diastolic parameters are consistent with Grade I diastolic dysfunction (impaired relaxation). Right Ventricle: The right ventricular size is normal. No increase in right ventricular wall thickness. Right ventricular systolic function is normal. Left Atrium: Left atrial size was normal in size. Right Atrium: Right atrial size was normal in size. Pericardium: There is no evidence of pericardial effusion. Mitral Valve: The mitral valve is normal in structure. No evidence of mitral valve regurgitation. No evidence of mitral valve stenosis. MV peak gradient, 3.7 mmHg. The mean mitral valve gradient is 1.0 mmHg. Tricuspid Valve: The tricuspid valve is normal  in structure. Tricuspid valve regurgitation is not demonstrated. No evidence of tricuspid stenosis. Aortic Valve: The aortic valve is normal in structure. Aortic valve regurgitation is trivial. Aortic valve sclerosis/calcification is present, without any evidence of aortic stenosis. Aortic valve mean gradient measures 2.5 mmHg. Aortic valve peak gradient measures 3.6 mmHg. Aortic valve area, by VTI measures 2.89 cm. Pulmonic Valve: The pulmonic valve was normal in structure. Pulmonic valve regurgitation is not visualized. No evidence of pulmonic stenosis. Aorta: The aortic root is normal in size and structure. Venous: The inferior vena cava was not well visualized. IAS/Shunts: No atrial level shunt detected by color flow Doppler.  LEFT VENTRICLE PLAX 2D LVIDd:         3.60 cm     Diastology LVIDs:         2.70 cm     LV e' medial:    3.81 cm/s LV PW:         1.10 cm     LV E/e' medial:  16.3 LV IVS:        1.15 cm     LV e' lateral:   3.92 cm/s LVOT diam:     2.00 cm     LV E/e' lateral: 15.8 LV SV:         65 LV SV Index:   38 LVOT Area:     3.14 cm  LV Volumes (MOD) LV vol d, MOD A2C: 31.7 ml LV vol d, MOD A4C: 36.9 ml LV vol s, MOD A2C: 24.7 ml LV vol s, MOD A4C: 22.6 ml LV SV MOD A2C:     7.0 ml LV SV MOD A4C:     36.9 ml LV SV MOD BP:      9.7 ml RIGHT VENTRICLE RV Basal diam:  3.10 cm RV S prime:     8.92 cm/s TAPSE (M-mode): 1.8 cm LEFT ATRIUM             Index        RIGHT ATRIUM          Index LA diam:        3.50 cm 2.06 cm/m   RA Area:     9.87 cm LA Vol (A2C):   17.3 ml 10.19 ml/m  RA Volume:   18.50 ml 10.90 ml/m LA Vol (A4C):   13.7 ml 8.07 ml/m LA Biplane Vol: 15.3 ml 9.01 ml/m  AORTIC VALVE                    PULMONIC VALVE AV Area (Vmax):    2.78 cm     PV Vmax:        0.55 m/s AV Area (Vmean):   2.56 cm     PV Vmean:       38.800 cm/s AV Area (VTI):     2.89 cm     PV VTI:         0.096 m AV Vmax:           95.20 cm/s   PV Peak grad:   1.2 mmHg AV Vmean:          71.550 cm/s  PV Mean  grad:   1.0 mmHg AV VTI:            0.224 m      RVOT Peak grad: 4 mmHg AV Peak Grad:      3.6 mmHg AV Mean Grad:      2.5 mmHg LVOT Vmax:         84.20 cm/s LVOT Vmean:        58.300 cm/s LVOT VTI:          0.206 m LVOT/AV VTI ratio: 0.92  AORTA Ao Root diam: 2.83 cm MITRAL VALVE               TRICUSPID VALVE MV Area (PHT): 5.20 cm    TR Peak grad:   10.2 mmHg MV Area VTI:   2.80 cm    TR Vmax:        160.00 cm/s MV Peak grad:  3.7 mmHg MV Mean grad:  1.0 mmHg  SHUNTS MV Vmax:       0.96 m/s    Systemic VTI:  0.21 m MV Vmean:      44.3 cm/s   Systemic Diam: 2.00 cm MV Decel Time: 146 msec    Pulmonic VTI:  0.207 m MV E velocity: 62.10 cm/s MV A velocity: 77.10 cm/s MV E/A ratio:  0.81 Kathlyn Sacramento MD Electronically signed by Kathlyn Sacramento MD Signature Date/Time: 10/24/2021/1:52:50 PM    Final    (Echo, Carotid, EGD, Colonoscopy, ERCP)    Subjective: Pt denies any complaints    Discharge Exam: Vitals:   10/26/21 0723 10/26/21 1113  BP: (!) 126/59 (!) 125/54  Pulse: 63 66  Resp: 18 18  Temp: 97.9 F (36.6 C) 97.8 F (36.6 C)  SpO2: 97% 97%   Vitals:   10/26/21 0305 10/26/21 0530 10/26/21 0723 10/26/21 1113  BP: (!) 126/55  (!) 126/59 (!) 125/54  Pulse: 64  63 66  Resp: '18  18 18  '$ Temp: 97.7 F (36.5 C)  97.9 F (36.6 C) 97.8 F (36.6 C)  TempSrc:      SpO2: 99%  97% 97%  Weight:  70.3 kg    Height:        General: Pt is alert, awake, not in acute distress Cardiovascular: S1/S2 +, no rubs, no gallops Respiratory: CTA bilaterally, no wheezing, no rhonchi Abdominal: Soft, NT, ND, bowel sounds + Extremities: no edema, no cyanosis    The results of significant diagnostics from this hospitalization (including imaging, microbiology, ancillary and laboratory) are listed below for reference.     Microbiology: Recent Results (from the past 240 hour(s))  Resp Panel by RT-PCR (Flu A&B, Covid) Nasopharyngeal Swab     Status: None   Collection Time: 10/24/21  9:00 AM   Specimen:  Nasopharyngeal Swab; Nasopharyngeal(NP) swabs in vial transport medium  Result Value Ref Range Status   SARS Coronavirus 2 by RT PCR NEGATIVE NEGATIVE Final    Comment: (NOTE) SARS-CoV-2 target nucleic acids are NOT DETECTED.  The SARS-CoV-2 RNA is generally detectable in upper respiratory specimens during the acute phase of infection. The lowest concentration of SARS-CoV-2 viral copies this assay can detect is 138 copies/mL. A negative result does not preclude SARS-Cov-2 infection and should not be used as the sole basis for treatment or other patient management decisions. A negative result may occur with  improper specimen collection/handling, submission of specimen other than nasopharyngeal swab, presence of viral mutation(s) within the areas targeted by this assay, and inadequate number of viral copies(<138 copies/mL). A negative result must be combined with clinical observations, patient history, and epidemiological information. The expected result is Negative.  Fact Sheet for Patients:  EntrepreneurPulse.com.au  Fact Sheet for Healthcare Providers:  IncredibleEmployment.be  This test is no t yet approved or cleared by the [place] FDA and  has been authorized for detection and/or diagnosis of SARS-CoV-2 by FDA under an Emergency Use Authorization (EUA). This EUA will remain  in effect (meaning this test can be used) for the duration of the COVID-19 declaration under Section 564(b)(1) of the Act, 21 U.S.C.section 360bbb-3(b)(1), unless the authorization is terminated  or revoked sooner.       Influenza A by PCR NEGATIVE NEGATIVE Final   Influenza B by PCR NEGATIVE NEGATIVE Final    Comment: (NOTE) The Xpert Xpress SARS-CoV-2/FLU/RSV plus assay is intended as an aid in the diagnosis of influenza from Nasopharyngeal swab specimens and should not be used as a sole basis for treatment.  Nasal washings and aspirates are unacceptable for  Xpert Xpress SARS-CoV-2/FLU/RSV testing.  Fact Sheet for Patients: EntrepreneurPulse.com.au  Fact Sheet for Healthcare Providers: IncredibleEmployment.be  This test is not yet approved or cleared by the [place] FDA and has been authorized for detection and/or diagnosis of SARS-CoV-2 by FDA under an Emergency Use Authorization (EUA). This EUA will remain in effect (meaning this test can be used) for the duration of the COVID-19 declaration under Section 564(b)(1) of the Act, 21 U.S.C. section 360bbb-3(b)(1), unless the authorization is terminated or revoked.  Performed at [facility], [address] [place], [place] [address]      Labs: BNP (last 3 results) Recent Labs    10/24/21 0626  BNP 02.7   Basic Metabolic Panel: Recent Labs  Lab 10/24/21 0626 10/24/21 0822 10/25/21 0249 10/26/21 0702  NA 142  --  139 141  K 3.7  --  3.7 3.8  CL 104  --  105 109  CO2 27  --  24 24  GLUCOSE 115*  --  120* 111*  BUN 31*  --  23 18  CREATININE 1.17*  --  1.19* 1.22*  CALCIUM 12.1*  --  10.7* 10.2  MG  --  2.0  --   --    Liver Function Tests: Recent Labs  Lab 10/24/21 0626 10/25/21 0249  AST 20 33  ALT 17 16  ALKPHOS 30* 32*  BILITOT 0.7 0.6  PROT 7.1 6.2*  ALBUMIN 3.8 3.9   No results for input(s): LIPASE, AMYLASE in the last 168 hours. No results for input(s): AMMONIA in the last 168 hours. CBC: Recent Labs  Lab 10/24/21 0626 10/25/21 0249 10/26/21 0702  WBC 7.6 8.6 8.9  NEUTROABS 2.8  --   --   HGB 15.6* 14.7 14.9  HCT 46.5* 42.9 44.2  MCV 86.0 85.0 85.5  PLT 150 149* 143*   Cardiac Enzymes: No results for input(s): CKTOTAL, CKMB, CKMBINDEX, TROPONINI in the last 168 hours. BNP: Invalid input(s): POCBNP CBG: Recent Labs  Lab 10/25/21 0737  GLUCAP 91   D-Dimer No results for input(s): DDIMER in the last 72 hours. Hgb A1c No results for input(s): HGBA1C in the last 72 hours. Lipid  Profile Recent Labs    10/25/21 0249  CHOL 177  HDL 40*  LDLCALC 108*  TRIG 143  CHOLHDL 4.4   Thyroid function studies No results for input(s): TSH, T4TOTAL, T3FREE, THYROIDAB in the last 72 hours.  Invalid input(s): FREET3 Anemia work up No results for input(s): VITAMINB12, FOLATE, FERRITIN, TIBC, IRON, RETICCTPCT in the last 72 hours. Urinalysis    Component Value Date/Time   COLORURINE STRAW (A) 10/09/2016 1525   APPEARANCEUR Cloudy (A) 09/23/2018 0952   LABSPEC 1.009 10/09/2016 1525   PHURINE 7.0 10/09/2016 1525   GLUCOSEU Negative 09/23/2018 0952   HGBUR SMALL (A) 10/09/2016 1525   BILIRUBINUR Negative 09/23/2018 0952   KETONESUR NEGATIVE 10/09/2016 1525   PROTEINUR Negative 09/23/2018 0952   PROTEINUR NEGATIVE 10/09/2016 1525   UROBILINOGEN 0.2 09/09/2012 1650   NITRITE Positive (A) 09/23/2018 0952   NITRITE NEGATIVE 10/09/2016 1525   LEUKOCYTESUR Negative 09/23/2018 0952   Sepsis Labs Invalid input(s): PROCALCITONIN,  WBC,  LACTICIDVEN Microbiology Recent Results (from the past 240 hour(s))  Resp Panel by RT-PCR (Flu A&B, Covid) Nasopharyngeal Swab     Status: None   Collection Time: 10/24/21  9:00 AM   Specimen: Nasopharyngeal Swab; Nasopharyngeal(NP) swabs in vial transport medium  Result Value Ref Range Status  SARS Coronavirus 2 by RT PCR NEGATIVE NEGATIVE Final    Comment: (NOTE) SARS-CoV-2 target nucleic acids are NOT DETECTED.  The SARS-CoV-2 RNA is generally detectable in upper respiratory specimens during the acute phase of infection. The lowest concentration of SARS-CoV-2 viral copies this assay can detect is 138 copies/mL. A negative result does not preclude SARS-Cov-2 infection and should not be used as the sole basis for treatment or other patient management decisions. A negative result may occur with  improper specimen collection/handling, submission of specimen other than nasopharyngeal swab, presence of viral mutation(s) within the areas  targeted by this assay, and inadequate number of viral copies(<138 copies/mL). A negative result must be combined with clinical observations, patient history, and epidemiological information. The expected result is Negative.  Fact Sheet for Patients:  EntrepreneurPulse.com.au  Fact Sheet for Healthcare Providers:  IncredibleEmployment.be  This test is no t yet approved or cleared by the [place] FDA and  has been authorized for detection and/or diagnosis of SARS-CoV-2 by FDA under an Emergency Use Authorization (EUA). This EUA will remain  in effect (meaning this test can be used) for the duration of the COVID-19 declaration under Section 564(b)(1) of the Act, 21 U.S.C.section 360bbb-3(b)(1), unless the authorization is terminated  or revoked sooner.       Influenza A by PCR NEGATIVE NEGATIVE Final   Influenza B by PCR NEGATIVE NEGATIVE Final    Comment: (NOTE) The Xpert Xpress SARS-CoV-2/FLU/RSV plus assay is intended as an aid in the diagnosis of influenza from Nasopharyngeal swab specimens and should not be used as a sole basis for treatment. Nasal washings and aspirates are unacceptable for Xpert Xpress SARS-CoV-2/FLU/RSV testing.  Fact Sheet for Patients: EntrepreneurPulse.com.au  Fact Sheet for Healthcare Providers: IncredibleEmployment.be  This test is not yet approved or cleared by the [place] FDA and has been authorized for detection and/or diagnosis of SARS-CoV-2 by FDA under an Emergency Use Authorization (EUA). This EUA will remain in effect (meaning this test can be used) for the duration of the COVID-19 declaration under Section 564(b)(1) of the Act, 21 U.S.C. section 360bbb-3(b)(1), unless the authorization is terminated or revoked.  Performed at [facility], [address], [place], [place] [address]      Time coordinating discharge: Over 30  minutes  SIGNED:   Wyvonnia Dusky, MD  [facility] 10/26/2021, 1:47 PM Pager   If 7PM-7AM, please contact night-coverage

## 2021-10-27 ENCOUNTER — Telehealth: Payer: Self-pay

## 2021-10-27 ENCOUNTER — Other Ambulatory Visit: Payer: PPO

## 2021-10-27 LAB — HEMOGLOBIN A1C
Hgb A1c MFr Bld: 5.2 % (ref 4.8–5.6)
Mean Plasma Glucose: 103 mg/dL

## 2021-10-27 NOTE — Telephone Encounter (Signed)
Transition Care Management Follow-up Telephone Call ?Date of discharge and from where: 10/26/2021-[facility] ?How have you been since you were released from the hospital? Doing well ?Any questions or concerns? No ? ?Items Reviewed: ?Did the pt receive and understand the discharge instructions provided? Yes  ?Medications obtained and verified? Yes  ?Other? Yes  ?Any new allergies since your discharge? No  ?Dietary orders reviewed? Yes ?Do you have support at home? Yes  ? ?Home Care and Equipment/Supplies: ?Were home health services ordered? no ?If so, what is the name of the agency? N/a  ?Has the agency set up a time to come to the patient's home? not applicable ?Were any new equipment or medical supplies ordered?  No ?What is the name of the medical supply agency? N/a ?Were you able to get the supplies/equipment? not applicable ?Do you have any questions related to the use of the equipment or supplies? No ? ?Functional Questionnaire: (I = Independent and D = Dependent) ?ADLs: I ? ?Bathing/Dressing- I ? ?Meal Prep- I ? ?Eating- I ? ?Maintaining continence- I ? ?Transferring/Ambulation- I ? ?Managing Meds- I ? ?Follow up appointments reviewed: ? ?PCP Hospital f/u appt confirmed? Yes  Scheduled to see Dr. Caryl Bis on 11/04/2021 @ 11:00. ?[facility] f/u appt confirmed? No  Cardiology to schedule ?Are transportation arrangements needed? No  ?If their condition worsens, is the pt aware to call PCP or go to the Emergency Dept.? Yes ?Was the patient provided with contact information for the PCP's office or ED? Yes ?Was to pt encouraged to call back with questions or concerns? Yes ? ?

## 2021-10-27 NOTE — Telephone Encounter (Signed)
Reviewed. Plan to see as scheduled.  ?

## 2021-11-02 NOTE — Telephone Encounter (Signed)
It sounds like this is just for a prior authorization. Please see what information they need and provide it if you can.  ?

## 2021-11-03 ENCOUNTER — Other Ambulatory Visit: Payer: Self-pay

## 2021-11-03 ENCOUNTER — Ambulatory Visit
Admission: RE | Admit: 2021-11-03 | Discharge: 2021-11-03 | Disposition: A | Discharge status: Home / Self Care | Payer: PPO | Source: Ambulatory Visit | Attending: Family Medicine | Admitting: Family Medicine

## 2021-11-03 DIAGNOSIS — I639 Cerebral infarction, unspecified: Secondary | ICD-10-CM | POA: Diagnosis present

## 2021-11-03 DIAGNOSIS — Z8673 Personal history of transient ischemic attack (TIA), and cerebral infarction without residual deficits: Secondary | ICD-10-CM | POA: Diagnosis not present

## 2021-11-03 DIAGNOSIS — I6503 Occlusion and stenosis of bilateral vertebral arteries: Secondary | ICD-10-CM | POA: Diagnosis not present

## 2021-11-03 DIAGNOSIS — I6601 Occlusion and stenosis of right middle cerebral artery: Secondary | ICD-10-CM | POA: Diagnosis not present

## 2021-11-03 DIAGNOSIS — I6612 Occlusion and stenosis of left anterior cerebral artery: Secondary | ICD-10-CM | POA: Diagnosis not present

## 2021-11-03 DIAGNOSIS — I6623 Occlusion and stenosis of bilateral posterior cerebral arteries: Secondary | ICD-10-CM | POA: Diagnosis not present

## 2021-11-03 MED ORDER — IOHEXOL 350 MG/ML SOLN
75.0000 mL | Freq: Once | INTRAVENOUS | Status: AC | PRN
  Administered 2021-11-03: 75 mL via INTRAVENOUS

## 2021-11-03 NOTE — Telephone Encounter (Signed)
[facility] called in stating that the pt received a denial on medication (Evolocumab (REPATHA) 140 MG/ML SOSY). Lennette Bihari stated they need a PA on medication. Lennette Bihari stated that the medication was denied due to no response from office. Lennette Bihari stated that DX and Labs information was missing. Lennette Bihari stated that he faxed over information to [place] at [phone].... Lennette Bihari stated that information needs to be sent over by tomorrow because of time frame. Lennette Bihari callback number is [phone] and fax number [phone]  ?

## 2021-11-04 ENCOUNTER — Ambulatory Visit (INDEPENDENT_AMBULATORY_CARE_PROVIDER_SITE_OTHER): Payer: PPO | Admitting: Family Medicine

## 2021-11-04 ENCOUNTER — Other Ambulatory Visit: Payer: Self-pay

## 2021-11-04 ENCOUNTER — Encounter: Payer: Self-pay | Admitting: Family Medicine

## 2021-11-04 VITALS — BP 120/78 | HR 60 | Temp 97.8°F | Ht 60.0 in | Wt 156.2 lb

## 2021-11-04 DIAGNOSIS — M546 Pain in thoracic spine: Secondary | ICD-10-CM

## 2021-11-04 DIAGNOSIS — I10 Essential (primary) hypertension: Secondary | ICD-10-CM | POA: Diagnosis not present

## 2021-11-04 DIAGNOSIS — I639 Cerebral infarction, unspecified: Secondary | ICD-10-CM

## 2021-11-04 DIAGNOSIS — T466X5A Adverse effect of antihyperlipidemic and antiarteriosclerotic drugs, initial encounter: Secondary | ICD-10-CM | POA: Diagnosis not present

## 2021-11-04 DIAGNOSIS — I214 Non-ST elevation (NSTEMI) myocardial infarction: Secondary | ICD-10-CM | POA: Diagnosis not present

## 2021-11-04 DIAGNOSIS — E782 Mixed hyperlipidemia: Secondary | ICD-10-CM | POA: Diagnosis not present

## 2021-11-04 DIAGNOSIS — M791 Myalgia, unspecified site: Secondary | ICD-10-CM

## 2021-11-04 HISTORY — DX: Pain in thoracic spine: M54.6

## 2021-11-04 NOTE — Telephone Encounter (Signed)
Labs and telephone note about the repatha was faxed to [facility] as requested, confirmation given.  Yaileen Hofferber,cma  ?

## 2021-11-04 NOTE — Assessment & Plan Note (Signed)
Suspect muscle strain or tightness.  She will stretch and continue to monitor. ?

## 2021-11-04 NOTE — Patient Instructions (Signed)
Nice to see you. ?We will recheck your lipid panel in about 6 weeks. ?If you have any recurrent chest pain or any shortness of breath or numbness, weakness, or vision changes you should seek medical attention. ?

## 2021-11-04 NOTE — Assessment & Plan Note (Signed)
Her symptoms have improved significantly.  She is now on aspirin and Plavix for her NSTEMI.  She will continue with those.  She did have a CT angiogram head and neck yesterday though those results have not returned yet.  We will contact her when those results return. ?

## 2021-11-04 NOTE — Assessment & Plan Note (Signed)
She will continue with Repatha 140 mg once every 14 days.  She will have a follow-up cholesterol check in about 5 weeks.  We will check with her insurance to determine what they need from [place] for this. ?

## 2021-11-04 NOTE — Assessment & Plan Note (Signed)
Well-controlled.  She will continue amlodipine 5 mg once daily. 

## 2021-11-04 NOTE — Progress Notes (Signed)
?Tommi Rumps, MD ?Phone: [phone] ? ?Gwendolyn King is a 81 y.o. female who presents today for hospital follow-up. ? ?NSTEMI: Patient had onset of chest pain.  She was evaluated in the emergency department and found to have an NSTEMI.  She underwent cardiac catheterization revealing severe two-vessel CAD including a 99% mid LAD lesion and 80% mid left circumflex lesion.  She underwent successful PCI to the mid LAD and proximal/mid left circumflex.  She had 0 present residual stenosis of either of those lesions.  She was placed on dual antiplatelet therapy with aspirin and Plavix for at least 12 months.  She recently started on Repatha though her insurance needs further information on this.  She follows up with cardiology in 2 weeks.  She has had no chest pain or shortness of breath since her catheterization.  She notes her wrist is healing well from the catheterization. ? ?Stroke: Patient was previously diagnosed with a thalamic infarction.  She reports the tingling has improved significantly and is only present on the bottom of her right foot. ? ?Left mid back pain: Patient reports occasional left mid back pain that only occurs when she is sitting at her desk chair.  It resolves if she stretches. ? ?Social History  ? ?Tobacco Use  ?Smoking Status Never  ?Smokeless Tobacco Never  ? ? ?Current Outpatient Medications on File Prior to Visit  ?Medication Sig Dispense Refill  ? acebutolol (SECTRAL) 200 MG capsule Take 1 capsule (200 mg total) by mouth daily. 90 capsule 2  ? acetaminophen (TYLENOL) 500 MG tablet Take 1,000 mg by mouth at bedtime.    ? amLODipine (NORVASC) 5 MG tablet Take 1 tablet (5 mg total) by mouth daily. 30 tablet 0  ? aspirin EC 81 MG tablet Take 81 mg by mouth at bedtime.     ? AZELASTINE HCL OP Place 1 drop into both eyes 3 (three) times daily as needed (dry/irritated eyes.).    ? Biotin 5 MG TABS Take 5 mg by mouth daily.    ? calcium-vitamin D (OSCAL WITH D) 500-200 MG-UNIT tablet Take 1  tablet by mouth daily.     ? cholecalciferol (VITAMIN D) 25 MCG (1000 UNIT) tablet Take 1,000 Units by mouth every evening.    ? clopidogrel (PLAVIX) 75 MG tablet Take 1 tablet (75 mg total) by mouth daily with breakfast. 30 tablet 0  ? EPINEPHrine 0.3 mg/0.3 mL IJ SOAJ injection Inject 0.3 mg into the muscle as needed for anaphylaxis.    ? Evolocumab (REPATHA) 140 MG/ML SOSY Inject 140 mg into the skin every 14 (fourteen) days. 6 mL 1  ? Krill Oil 500 MG CAPS Take 500 mg by mouth daily.    ? letrozole (FEMARA) 2.5 MG tablet Take 1 tablet (2.5 mg total) by mouth daily. 90 tablet 0  ? levocetirizine (XYZAL) 5 MG tablet Take 5 mg by mouth every evening.   3  ? LEVOXYL 75 MCG tablet Take 1 tablet by mouth once daily 90 tablet 2  ? Multiple Vitamin (MULTIVITAMIN WITH MINERALS) TABS tablet Take 1 tablet by mouth daily. Adult 50+    ? polyethylene glycol powder (GLYCOLAX/MIRALAX) powder Take 17 g by mouth daily. With coffee    ? vitamin B-12 (CYANOCOBALAMIN) 1000 MCG tablet Take 1,000 mcg by mouth daily.    ? ?No current facility-administered medications on file prior to visit.  ? ? ? ?ROS see history of present illness ? ?Objective ? ?Physical Exam ?Vitals:  ? 11/04/21 1110  ?  BP: 120/78  ?Pulse: 60  ?Temp: 97.8 ?F (36.6 ?C)  ?SpO2: 99%  ? ? ?BP Readings from Last 3 Encounters:  ?11/04/21 120/78  ?10/26/21 (!) 125/54  ?10/17/21 140/80  ? ?Wt Readings from Last 3 Encounters:  ?11/04/21 156 lb 3.2 oz (70.9 kg)  ?10/26/21 154 lb 14.4 oz (70.3 kg)  ?10/17/21 157 lb 9.6 oz (71.5 kg)  ? ? ?Physical Exam ?Constitutional:   ?   General: She is not in acute distress. ?   Appearance: She is not diaphoretic.  ?Cardiovascular:  ?   Rate and Rhythm: Normal rate and regular rhythm.  ?   Heart sounds: Normal heart sounds.  ?Pulmonary:  ?   Effort: Pulmonary effort is normal.  ?   Breath sounds: Normal breath sounds.  ?Musculoskeletal:  ?   Comments: No tenderness to palpation in the left mid back  ?Skin: ?   General: Skin is warm and  dry.  ?   Comments: Left wrist appears to be well-healed from catheterization  ?Neurological:  ?   Mental Status: She is alert.  ?   Comments: CN 3-12 intact, 5/5 strength in bilateral biceps, triceps, grip, quads, hamstrings, plantar and dorsiflexion, sensation to light touch intact in bilateral UE and LE, normal gait  ? ? ? ?Assessment/Plan: Please see individual problem list. ? ?Problem List Items Addressed This Visit   ? ? Hyperlipidemia (Chronic)  ?  She will continue with Repatha 140 mg once every 14 days.  She will have a follow-up cholesterol check in about 5 weeks.  We will check with her insurance to determine what they need from [place] for this. ?  ?  ? Relevant Orders  ? Lipid panel  ? Hepatic function panel  ? Hypertension (Chronic)  ?  Well-controlled.  She will continue amlodipine 5 mg once daily. ?  ?  ? CVA (cerebral vascular accident) ([place])  ?  Her symptoms have improved significantly.  She is now on aspirin and Plavix for her NSTEMI.  She will continue with those.  She did have a CT angiogram head and neck yesterday though those results have not returned yet.  We will contact her when those results return. ?  ?  ? 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  Patient has been unable to tolerate statins in the past. ?  ?  ? NSTEMI (non-ST elevated myocardial infarction) ([place]) - Primary  ?  Patient underwent successful PCI to both lesions.  She will continue to follow with cardiology.  She will continue on dual antiplatelet therapy with aspirin and Plavix for at least 12 months. ?  ?  ? ? ?Return in about 5 weeks (around 12/09/2021) for Labs, 3 months PCP. ? ?This visit occurred during the SARS-CoV-2 public health emergency.  Safety protocols were in place, including screening questions prior to the visit, additional usage of staff PPE, and extensive cleaning of exam room while observing appropriate contact time as indicated for disinfecting solutions.  ? ? ?Tommi Rumps, MD ?[place] ? ?

## 2021-11-04 NOTE — Assessment & Plan Note (Signed)
Patient has been unable to tolerate statins in the past. ?

## 2021-11-04 NOTE — Assessment & Plan Note (Signed)
Patient underwent successful PCI to both lesions.  She will continue to follow with cardiology.  She will continue on dual antiplatelet therapy with aspirin and Plavix for at least 12 months. ?

## 2021-11-07 NOTE — Telephone Encounter (Signed)
Fax received from [facility] but not until 11/07/21 was faxed on Friday from [facility] [facility] in my Box on 11/07/21 I have faxed last office notes and labs. ?

## 2021-11-09 ENCOUNTER — Telehealth: Payer: Self-pay

## 2021-11-09 ENCOUNTER — Encounter: Payer: Self-pay | Admitting: Family Medicine

## 2021-11-09 NOTE — Telephone Encounter (Signed)
Lvm for pt to return call in regards to labs. ? ?Per Dr.Sonnenberg: ?Please let the patient know that there are areas of narrowing in the vessels in her head. I would like to refer her to vascular surgery to get their input on these and see if there is anything that needs to be done about them.  ?

## 2021-11-10 ENCOUNTER — Encounter: Payer: PPO | Attending: Internal Medicine

## 2021-11-10 ENCOUNTER — Other Ambulatory Visit: Payer: Self-pay

## 2021-11-10 DIAGNOSIS — I214 Non-ST elevation (NSTEMI) myocardial infarction: Secondary | ICD-10-CM

## 2021-11-10 DIAGNOSIS — Z955 Presence of coronary angioplasty implant and graft: Secondary | ICD-10-CM | POA: Insufficient documentation

## 2021-11-10 DIAGNOSIS — J3081 Allergic rhinitis due to animal (cat) (dog) hair and dander: Secondary | ICD-10-CM | POA: Diagnosis not present

## 2021-11-10 DIAGNOSIS — J301 Allergic rhinitis due to pollen: Secondary | ICD-10-CM | POA: Diagnosis not present

## 2021-11-10 DIAGNOSIS — I252 Old myocardial infarction: Secondary | ICD-10-CM | POA: Insufficient documentation

## 2021-11-10 DIAGNOSIS — J3089 Other allergic rhinitis: Secondary | ICD-10-CM | POA: Diagnosis not present

## 2021-11-10 NOTE — Progress Notes (Signed)
Virtual Visit completed. Patient informed on EP and RD appointment and 6 Minute walk test. Patient also informed of patient health questionnaires on My Chart. Patient Verbalizes understanding. Visit diagnosis can be found in [facility] 10/24/2021. ?

## 2021-11-11 ENCOUNTER — Other Ambulatory Visit: Payer: Self-pay | Admitting: Family Medicine

## 2021-11-11 DIAGNOSIS — I679 Cerebrovascular disease, unspecified: Secondary | ICD-10-CM

## 2021-11-14 ENCOUNTER — Encounter: Payer: Self-pay | Admitting: Family Medicine

## 2021-11-14 ENCOUNTER — Ambulatory Visit (INDEPENDENT_AMBULATORY_CARE_PROVIDER_SITE_OTHER): Payer: PPO | Admitting: Family Medicine

## 2021-11-14 ENCOUNTER — Other Ambulatory Visit: Payer: Self-pay

## 2021-11-14 DIAGNOSIS — E782 Mixed hyperlipidemia: Secondary | ICD-10-CM

## 2021-11-14 DIAGNOSIS — I639 Cerebral infarction, unspecified: Secondary | ICD-10-CM

## 2021-11-14 DIAGNOSIS — M79605 Pain in left leg: Secondary | ICD-10-CM | POA: Diagnosis not present

## 2021-11-14 NOTE — Patient Instructions (Signed)
 Nice to see you. Please do the exercises.  You can take over-the-counter Tylenol .  If you are not improving over the next few weeks please let us  know.  Back Exercises The following exercises strengthen the muscles that help to support the trunk (torso) and back. They also help to keep the lower back flexible. Doing these exercises can help to prevent or lessen existing low back pain. If you have back pain or discomfort, try doing these exercises 2-3 times each day or as told by your health care provider. As your pain improves, do them once each day, but increase the number of times that you repeat the steps for each exercise (do more repetitions). To prevent the recurrence of back pain, continue to do these exercises once each day or as told by your health care provider. Do exercises exactly as told by your health care provider and adjust them as directed. It is normal to feel mild stretching, pulling, tightness, or discomfort as you do these exercises, but you should stop right away if you feel sudden pain or your pain gets worse. Exercises Single knee to chest Repeat these steps 3-5 times for each leg: Lie on your back on a firm bed or the floor with your legs extended. Bring one knee to your chest. Your other leg should stay extended and in contact with the floor. Hold your knee in place by grabbing your knee or thigh with both hands and hold. Pull on your knee until you feel a gentle stretch in your lower back or buttocks. Hold the stretch for 10-30 seconds. Slowly release and straighten your leg.  Pelvic tilt Repeat these steps 5-10 times: Lie on your back on a firm bed or the floor with your legs extended. Bend your knees so they are pointing toward the ceiling and your feet are flat on the floor. Tighten your lower abdominal muscles to press your lower back against the floor. This motion will tilt your pelvis so your tailbone points up toward the ceiling instead of pointing to your  feet or the floor. With gentle tension and even breathing, hold this position for 5-10 seconds.  Cat-cow Repeat these steps until your lower back becomes more flexible: Get into a hands-and-knees position on a firm bed or the floor. Keep your hands under your shoulders, and keep your knees under your hips. You may place padding under your knees for comfort. Let your head hang down toward your chest. Contract your abdominal muscles and point your tailbone toward the floor so your lower back becomes rounded like the back of a cat. Hold this position for 5 seconds. Slowly lift your head, let your abdominal muscles relax, and point your tailbone up toward the ceiling so your back forms a sagging arch like the back of a cow. Hold this position for 5 seconds.  Press-ups Repeat these steps 5-10 times: Lie on your abdomen (face-down) on a firm bed or the floor. Place your palms near your head, about shoulder-width apart. Keeping your back as relaxed as possible and keeping your hips on the floor, slowly straighten your arms to raise the top half of your body and lift your shoulders. Do not use your back muscles to raise your upper torso. You may adjust the placement of your hands to make yourself more comfortable. Hold this position for 5 seconds while you keep your back relaxed. Slowly return to lying flat on the floor.  Bridges Repeat these steps 10 times: Lie on your back  on a firm bed or the floor. Bend your knees so they are pointing toward the ceiling and your feet are flat on the floor. Your arms should be flat at your sides, next to your body. Tighten your buttocks muscles and lift your buttocks off the floor until your waist is at almost the same height as your knees. You should feel the muscles working in your buttocks and the back of your thighs. If you do not feel these muscles, slide your feet 1-2 inches (2.5-5 cm) farther away from your buttocks. Hold this position for 3-5  seconds. Slowly lower your hips to the starting position, and allow your buttocks muscles to relax completely. If this exercise is too easy, try doing it with your arms crossed over your chest. Abdominal crunches Repeat these steps 5-10 times: Lie on your back on a firm bed or the floor with your legs extended. Bend your knees so they are pointing toward the ceiling and your feet are flat on the floor. Cross your arms over your chest. Tip your chin slightly toward your chest without bending your neck. Tighten your abdominal muscles and slowly raise your torso high enough to lift your shoulder blades a tiny bit off the floor. Avoid raising your torso higher than that because it can put too much stress on your lower back and does not help to strengthen your abdominal muscles. Slowly return to your starting position.  Back lifts Repeat these steps 5-10 times: Lie on your abdomen (face-down) with your arms at your sides, and rest your forehead on the floor. Tighten the muscles in your legs and your buttocks. Slowly lift your chest off the floor while you keep your hips pressed to the floor. Keep the back of your head in line with the curve in your back. Your eyes should be looking at the floor. Hold this position for 3-5 seconds. Slowly return to your starting position.  Contact a health care provider if: Your back pain or discomfort gets much worse when you do an exercise. Your worsening back pain or discomfort does not lessen within 2 hours after you exercise. If you have any of these problems, stop doing these exercises right away. Do not do them again unless your health care provider says that you can. Get help right away if: You develop sudden, severe back pain. If this happens, stop doing the exercises right away. Do not do them again unless your health care provider says that you can. This information is not intended to replace advice given to you by your health care provider. Make sure  you discuss any questions you have with your health care provider. Document Revised: 02/01/2021 Document Reviewed: 10/20/2020 [employer] Patient Education  2022 [employer]  Hip Exercises Ask your health care provider which exercises are safe for you. Do exercises exactly as told by your health care provider and adjust them as directed. It is normal to feel mild stretching, pulling, tightness, or discomfort as you do these exercises. Stop right away if you feel sudden pain or your pain gets worse. Do not begin these exercises until told by your health care provider. Stretching and range-of-motion exercises These exercises warm up your muscles and joints and improve the movement and flexibility of your hip. These exercises also help to relieve pain, numbness, and tingling. You may be asked to limit your range of motion if you had a hip replacement. Talk to your health care provider about these restrictions. Hamstrings, supine  Lie on  your back (supine position). Loop a belt or towel over the ball of your left / right foot. The ball of your foot is on the walking surface, right under your toes. Straighten your left / right knee and slowly pull on the belt or towel to raise your leg until you feel a gentle stretch behind your knee (hamstring). Do not let your knee bend while you do this. Keep your other leg flat on the floor. Hold this position for __________ seconds. Slowly return your leg to the starting position. Repeat __________ times. Complete this exercise __________ times a day. Hip rotation  Lie on your back on a firm surface. With your left / right hand, gently pull your left / right knee toward the shoulder that is on the same side of the body. Stop when your knee is pointing toward the ceiling. Hold your left / right ankle with your other hand. Keeping your knee steady, gently pull your left / right ankle toward your other shoulder until you feel a stretch in your buttocks. Keep  your hips and shoulders firmly planted while you do this stretch. Hold this position for __________ seconds. Repeat __________ times. Complete this exercise __________ times a day. Seated stretch This exercise is sometimes called hamstrings and adductors stretch. Sit on the floor with your legs stretched wide. Keep your knees straight during this exercise. Keeping your head and back in a straight line, bend at your waist to reach for your left foot (position A). You should feel a stretch in your right inner thigh (adductors). Hold this position for __________ seconds. Then slowly return to the upright position. Keeping your head and back in a straight line, bend at your waist to reach forward (position B). You should feel a stretch behind both of your thighs and knees (hamstrings). Hold this position for __________ seconds. Then slowly return to the upright position. Keeping your head and back in a straight line, bend at your waist to reach for your right foot (position C). You should feel a stretch in your left inner thigh (adductors). Hold this position for __________ seconds. Then slowly return to the upright position. Repeat __________ times. Complete this exercise __________ times a day. Lunge This exercise stretches the muscles of the hip (hip flexors). Place your left / right knee on the floor and bend your other knee so that is directly over your ankle. You should be half-kneeling. Keep good posture with your head over your shoulders. Tighten your buttocks to point your tailbone downward. This will prevent your back from arching too much. You should feel a gentle stretch in the front of your left / right thigh and hip. If you do not feel a stretch, slide your other foot forward slightly and then slowly lunge forward with your chest up until your knee once again lines up over your ankle. Make sure your tailbone continues to point downward. Hold this position for __________ seconds. Slowly  return to the starting position. Repeat __________ times. Complete this exercise __________ times a day. Strengthening exercises These exercises build strength and endurance in your hip. Endurance is the ability to use your muscles for a long time, even after they get tired. Bridge This exercise strengthens the muscles of your hip (hip extensors). Lie on your back on a firm surface with your knees bent and your feet flat on the floor. Tighten your buttocks muscles and lift your bottom off the floor until the trunk of your body and your hips  are level with your thighs. Do not arch your back. You should feel the muscles working in your buttocks and the back of your thighs. If you do not feel these muscles, slide your feet 1-2 inches (2.5-5 cm) farther away from your buttocks. Hold this position for __________ seconds. Slowly lower your hips to the starting position. Let your muscles relax completely between repetitions. Repeat __________ times. Complete this exercise __________ times a day. Straight leg raises, side-lying This exercise strengthens the muscles that move the hip joint away from the center of the body (hip abductors). Lie on your side with your left / right leg in the top position. Lie so your head, shoulder, hip, and knee line up. You may bend your bottom knee slightly to help you balance. Roll your hips slightly forward, so your hips are stacked directly over each other and your left / right knee is facing forward. Leading with your heel, lift your top leg 4-6 inches (10-15 cm). You should feel the muscles in your top hip lifting. Do not let your foot drift forward. Do not let your knee roll toward the ceiling. Hold this position for __________ seconds. Slowly return to the starting position. Let your muscles relax completely between repetitions. Repeat __________ times. Complete this exercise __________ times a day. Straight leg raises, side-lying This exercise strengthens  the muscles that move the hip joint toward the center of the body (hip adductors). Lie on your side with your left / right leg in the bottom position. Lie so your head, shoulder, hip, and knee line up. You may place your upper foot in front to help you balance. Roll your hips slightly forward, so your hips are stacked directly over each other and your left / right knee is facing forward. Tense the muscles in your inner thigh and lift your bottom leg 4-6 inches (10-15 cm). Hold this position for __________ seconds. Slowly return to the starting position. Let your muscles relax completely between repetitions. Repeat __________ times. Complete this exercise __________ times a day. Straight leg raises, supine This exercise strengthens the muscles in the front of your thigh (quadriceps). Lie on your back (supine position) with your left / right leg extended and your other knee bent. Tense the muscles in the front of your left / right thigh. You should see your kneecap slide up or see increased dimpling just above your knee. Keep these muscles tight as you raise your leg 4-6 inches (10-15 cm) off the floor. Do not let your knee bend. Hold this position for __________ seconds. Keep these muscles tense as you lower your leg. Relax the muscles slowly and completely between repetitions. Repeat __________ times. Complete this exercise __________ times a day. Hip abductors, standing This exercise strengthens the muscles that move the leg and hip joint away from the center of the body (hip abductors). Tie one end of a rubber exercise band or tubing to a secure surface, such as a chair, table, or pole. Loop the other end of the band or tubing around your left / right ankle. Keeping your ankle with the band or tubing directly opposite the secured end, step away until there is tension in the tubing or band. Hold on to a chair, table, or pole as needed for balance. Lift your left / right leg out to your side.  While you do this: Keep your back upright. Keep your shoulders over your hips. Keep your toes pointing forward. Make sure to use your hip muscles to  slowly lift your leg. Do not tip your body or forcefully lift your leg. Hold this position for __________ seconds. Slowly return to the starting position. Repeat __________ times. Complete this exercise __________ times a day. Squats This exercise strengthens the muscles in the front of your thigh (quadriceps). Stand in a door frame so your feet and knees are in line with the frame. You may place your hands on the frame for balance. Slowly bend your knees and lower your hips like you are going to sit in a chair. Keep your lower legs in a straight-up-and-down position. Do not let your hips go lower than your knees. Do not bend your knees lower than told by your health care provider. If your hip pain increases, do not bend as low. Hold this position for ___________ seconds. Slowly push with your legs to return to standing. Do not use your hands to pull yourself to standing. Repeat __________ times. Complete this exercise __________ times a day. This information is not intended to replace advice given to you by your health care provider. Make sure you discuss any questions you have with your health care provider. Document Revised: 12/22/2020 Document Reviewed: 12/22/2020 Elsevier Patient Education  2022 [employer].

## 2021-11-14 NOTE — Progress Notes (Signed)
?Tommi Rumps, MD ?Phone: [phone] ? ?Gwendolyn King is a 81 y.o. female who presents today for same day visit.  ? ?Left leg pain: Onset recently.  Notes pain at the site of her tensor fascia latae muscle.  Not stepping up bothers her.  Notes her bilateral legs feel little weak.  She has some back pain and notes she massaged her back and that helped with the leg pain.  Notes the pain does not radiate past her upper thigh.  No numbness.  She tried Tylenol last night though is unsure of the benefit since she went to sleep.  She notes on palpation today the pain is a 1/10.  The worst it has been is a 4/10. ? ?Social History  ? ?Tobacco Use  ?Smoking Status Never  ?Smokeless Tobacco Never  ? ? ?Current Outpatient Medications on File Prior to Visit  ?Medication Sig Dispense Refill  ? acebutolol (SECTRAL) 200 MG capsule Take 1 capsule (200 mg total) by mouth daily. 90 capsule 2  ? acetaminophen (TYLENOL) 500 MG tablet Take 1,000 mg by mouth at bedtime.    ? amLODipine (NORVASC) 5 MG tablet Take 1 tablet (5 mg total) by mouth daily. 30 tablet 0  ? aspirin EC 81 MG tablet Take 81 mg by mouth at bedtime.     ? AZELASTINE HCL OP Place 1 drop into both eyes 3 (three) times daily as needed (dry/irritated eyes.).    ? Biotin 5 MG TABS Take 5 mg by mouth daily.    ? calcium-vitamin D (OSCAL WITH D) 500-200 MG-UNIT tablet Take 1 tablet by mouth daily.     ? cholecalciferol (VITAMIN D) 25 MCG (1000 UNIT) tablet Take 1,000 Units by mouth every evening.    ? clopidogrel (PLAVIX) 75 MG tablet Take 1 tablet (75 mg total) by mouth daily with breakfast. 30 tablet 0  ? EPINEPHrine 0.3 mg/0.3 mL IJ SOAJ injection Inject 0.3 mg into the muscle as needed for anaphylaxis.    ? Evolocumab (REPATHA) 140 MG/ML SOSY Inject 140 mg into the skin every 14 (fourteen) days. 6 mL 1  ? Krill Oil 500 MG CAPS Take 500 mg by mouth daily.    ? letrozole (FEMARA) 2.5 MG tablet Take 1 tablet (2.5 mg total) by mouth daily. 90 tablet 0  ? levocetirizine  (XYZAL) 5 MG tablet Take 5 mg by mouth every evening.   3  ? LEVOXYL 75 MCG tablet Take 1 tablet by mouth once daily 90 tablet 2  ? Multiple Vitamin (MULTIVITAMIN WITH MINERALS) TABS tablet Take 1 tablet by mouth daily. Adult 50+    ? polyethylene glycol powder (GLYCOLAX/MIRALAX) powder Take 17 g by mouth daily. With coffee    ? vitamin B-12 (CYANOCOBALAMIN) 1000 MCG tablet Take 1,000 mcg by mouth daily.    ? ?No current facility-administered medications on file prior to visit.  ? ? ? ?ROS see history of present illness ? ?Objective ? ?Physical Exam ?Vitals:  ? 11/14/21 1339  ?BP: 118/80  ?Pulse: 61  ?Temp: 98.1 ?F (36.7 ?C)  ?SpO2: 99%  ? ? ?BP Readings from Last 3 Encounters:  ?11/14/21 118/80  ?11/04/21 120/78  ?10/26/21 (!) 125/54  ? ?Wt Readings from Last 3 Encounters:  ?11/14/21 156 lb 9.6 oz (71 kg)  ?11/04/21 156 lb 3.2 oz (70.9 kg)  ?10/26/21 154 lb 14.4 oz (70.3 kg)  ? ? ?Physical Exam ?Musculoskeletal:  ?     Legs: ? ?   Comments: No midline spine tenderness,  no midline spine step-off, no muscular back tenderness  ?Neurological:  ?   Comments: 5/5 strength bilateral quads, hamstrings, plantarflexion, and dorsiflexion, sensation to light touch intact bilateral lower extremities  ? ? ? ?Assessment/Plan: Please see individual problem list. ? ?Problem List Items Addressed This Visit   ? ? Leg pain, anterior, upper, left  ?  Suspect strain of the tensor fascia latae muscle.  Also discussed that this could represent a nerve impingement though I would expect the pain to radiate further down her legs.  Discussed doing exercises at home and taking Tylenol over-the-counter.  Advised that I did not think the Repatha was contributing to this as I would expect her to have more widespread muscle aches.  Discussed discontinuing the exercises and letting me know if she has excessive discomfort or if she has trouble completing the exercises at home.  Discussed physical therapy would be the next step if not improving. ?  ?   ? ? ? ?Return if symptoms worsen or fail to improve. ? ?This visit occurred during the SARS-CoV-2 public health emergency.  Safety protocols were in place, including screening questions prior to the visit, additional usage of staff PPE, and extensive cleaning of exam room while observing appropriate contact time as indicated for disinfecting solutions.  ? ? ?Tommi Rumps, MD ?[place] ? ?

## 2021-11-14 NOTE — Assessment & Plan Note (Signed)
Suspect strain of the tensor fascia latae muscle.  Also discussed that this could represent a nerve impingement though I would expect the pain to radiate further down her legs.  Discussed doing exercises at home and taking Tylenol over-the-counter.  Advised that I did not think the Repatha was contributing to this as I would expect her to have more widespread muscle aches.  Discussed discontinuing the exercises and letting me know if she has excessive discomfort or if she has trouble completing the exercises at home.  Discussed physical therapy would be the next step if not improving. ?

## 2021-11-15 MED ORDER — REPATHA 140 MG/ML ~~LOC~~ SOSY: 140.0000 mg | PREFILLED_SYRINGE | SUBCUTANEOUS | 1 refills | Status: DC

## 2021-11-17 VITALS — Ht 60.25 in | Wt 156.0 lb

## 2021-11-17 DIAGNOSIS — J3081 Allergic rhinitis due to animal (cat) (dog) hair and dander: Secondary | ICD-10-CM | POA: Diagnosis not present

## 2021-11-17 DIAGNOSIS — I252 Old myocardial infarction: Secondary | ICD-10-CM | POA: Diagnosis present

## 2021-11-17 DIAGNOSIS — J3089 Other allergic rhinitis: Secondary | ICD-10-CM | POA: Diagnosis not present

## 2021-11-17 DIAGNOSIS — J301 Allergic rhinitis due to pollen: Secondary | ICD-10-CM | POA: Diagnosis not present

## 2021-11-17 DIAGNOSIS — I214 Non-ST elevation (NSTEMI) myocardial infarction: Secondary | ICD-10-CM

## 2021-11-17 DIAGNOSIS — Z955 Presence of coronary angioplasty implant and graft: Secondary | ICD-10-CM | POA: Diagnosis not present

## 2021-11-17 NOTE — Patient Instructions (Signed)
Patient Instructions ? ?Patient Details  ?Name: Gwendolyn King ?MRN: [id number] ?Date of Birth: [birth year] ?Referring Provider:  Nelva Bush, MD ? ?Below are your personal goals for exercise, nutrition, and risk factors. Our goal is to help you stay on track towards obtaining and maintaining these goals. We will be discussing your progress on these goals with you throughout the program. ? ?Initial Exercise Prescription: ? Initial Exercise Prescription - 11/17/21 1700   ? ?  ? Date of Initial Exercise RX and Referring Provider  ? Date 11/17/21   ? Referring Provider End, Harrell Gave MD   ?  ? Oxygen  ? Maintain Oxygen Saturation 88% or higher   ?  ? Treadmill  ? MPH 2   ? Grade 0   ? Minutes 15   ? METs 2.53   ?  ? Recumbant Bike  ? Level 1   ? RPM 60   ? Watts 20   ? Minutes 15   ? METs 1.9   ?  ? NuStep  ? Level 2   ? SPM 80   ? Minutes 15   ? METs 1.9   ?  ? REL-XR  ? Level 1   ? Speed 50   ? Minutes 15   ? METs 1.9   ?  ? T5 Nustep  ? Level 1   ? SPM 80   ? Minutes 15   ? METs 1.9   ?  ? Prescription Details  ? Frequency (times per week) 3   ? Duration Progress to 30 minutes of continuous aerobic without signs/symptoms of physical distress   ?  ? Intensity  ? THRR 40-80% of Max Heartrate 94- 126   ? Ratings of Perceived Exertion 11-13   ? Perceived Dyspnea 0-4   ?  ? Progression  ? Progression Continue to progress workloads to maintain intensity without signs/symptoms of physical distress.   ?  ? Resistance Training  ? Training Prescription Yes   ? Weight 3 lb   ? Reps 10-15   ? ?  ?  ? ?  ? ? ?Exercise Goals: ?Frequency: Be able to perform aerobic exercise two to three times per week in program working toward 2-5 days per week of home exercise. ? ?Intensity: Work with a perceived exertion of 11 (fairly light) - 15 (hard) while following your exercise prescription.  We will make changes to your prescription with you as you progress through the program. ?  ?Duration: Be able to do 30 to 45 minutes of continuous  aerobic exercise in addition to a 5 minute warm-up and a 5 minute cool-down routine. ?  ?Nutrition Goals: ?Your personal nutrition goals will be established when you do your nutrition analysis with the dietician. ? ?The following are general nutrition guidelines to follow: ?Cholesterol < '200mg'$ /day ?Sodium < '1500mg'$ /day ?Fiber: Women over 50 yrs - 21 grams per day ? ?Personal Goals: ? Personal Goals and Risk Factors at Admission - 11/17/21 1718   ? ?  ? Core Components/Risk Factors/Patient Goals on Admission  ?  Weight Management Yes;Weight Loss   ? Intervention Weight Management: Develop a combined nutrition and exercise program designed to reach desired caloric intake, while maintaining appropriate intake of nutrient and fiber, sodium and fats, and appropriate energy expenditure required for the weight goal.;Weight Management: Provide education and appropriate resources to help participant work on and attain dietary goals.;Weight Management/Obesity: Establish reasonable short term and long term weight goals.;Obesity: Provide education and appropriate resources to help  participant work on and attain dietary goals.   ? Admit Weight 156 lb (70.8 kg)   ? Goal Weight: Short Term 152 lb (68.9 kg)   ? Goal Weight: Long Term 146 lb (66.2 kg)   ? Expected Outcomes Short Term: Continue to assess and modify interventions until short term weight is achieved;Long Term: Adherence to nutrition and physical activity/exercise program aimed toward attainment of established weight goal;Weight Loss: Understanding of general recommendations for a balanced deficit meal plan, which promotes 1-2 lb weight loss per week and includes a negative energy balance of [phone] kcal/d;Understanding recommendations for meals to include 15-35% energy as protein, 25-35% energy from fat, 35-60% energy from carbohydrates, less than '200mg'$  of dietary cholesterol, 20-35 gm of total fiber daily;Understanding of distribution of calorie intake throughout the  day with the consumption of 4-5 meals/snacks   ? Hypertension Yes   ? Intervention Provide education on lifestyle modifcations including regular physical activity/exercise, weight management, moderate sodium restriction and increased consumption of fresh fruit, vegetables, and low fat dairy, alcohol moderation, and smoking cessation.;Monitor prescription use compliance.   ? Expected Outcomes Short Term: Continued assessment and intervention until BP is < 140/73m HG in hypertensive participants. < 130/845mHG in hypertensive participants with diabetes, heart failure or chronic kidney disease.;Long Term: Maintenance of blood pressure at goal levels.   ? Lipids Yes   ? Intervention Provide education and support for participant on nutrition & aerobic/resistive exercise along with prescribed medications to achieve LDL '70mg'$ , HDL >'40mg'$ .   ? Expected Outcomes Short Term: Participant states understanding of desired cholesterol values and is compliant with medications prescribed. Participant is following exercise prescription and nutrition guidelines.;Long Term: Cholesterol controlled with medications as prescribed, with individualized exercise RX and with personalized nutrition plan. Value goals: LDL < '70mg'$ , HDL > 40 mg.   ? ?  ?  ? ?  ? ? ?Tobacco Use Initial Evaluation: ?Social History  ? ?Tobacco Use  ?Smoking Status Never  ?Smokeless Tobacco Never  ? ? ?Exercise Goals and Review: ? Exercise Goals   ? ? [place] 11/17/21 1717  ?  ?  ?  ?  ?  ? Exercise Goals  ? Increase Physical Activity Yes      ? Intervention Provide advice, education, support and counseling about physical activity/exercise needs.;Develop an individualized exercise prescription for aerobic and resistive training based on initial evaluation findings, risk stratification, comorbidities and participant's personal goals.      ? Expected Outcomes Short Term: Attend rehab on a regular basis to increase amount of physical activity.;Long Term: Add in home  exercise to make exercise part of routine and to increase amount of physical activity.;Long Term: Exercising regularly at least 3-5 days a week.      ? Increase Strength and Stamina Yes      ? Intervention Develop an individualized exercise prescription for aerobic and resistive training based on initial evaluation findings, risk stratification, comorbidities and participant's personal goals.;Provide advice, education, support and counseling about physical activity/exercise needs.      ? Expected Outcomes Short Term: Increase workloads from initial exercise prescription for resistance, speed, and METs.;Short Term: Perform resistance training exercises routinely during rehab and add in resistance training at home;Long Term: Improve cardiorespiratory fitness, muscular endurance and strength as measured by increased METs and functional capacity (6MWT)      ? Able to understand and use rate of perceived exertion (RPE) scale Yes      ? Intervention Provide education and explanation on how  to use RPE scale      ? Expected Outcomes Short Term: Able to use RPE daily in rehab to express subjective intensity level;Long Term:  Able to use RPE to guide intensity level when exercising independently      ? Able to understand and use Dyspnea scale Yes      ? Intervention Provide education and explanation on how to use Dyspnea scale      ? Expected Outcomes Short Term: Able to use Dyspnea scale daily in rehab to express subjective sense of shortness of breath during exertion;Long Term: Able to use Dyspnea scale to guide intensity level when exercising independently      ? Knowledge and understanding of Target Heart Rate Range (THRR) Yes      ? Intervention Provide education and explanation of THRR including how the numbers were predicted and where they are located for reference      ? Expected Outcomes Short Term: Able to state/look up THRR;Long Term: Able to use THRR to govern intensity when exercising independently;Short Term:  Able to use daily as guideline for intensity in rehab      ? Able to check pulse independently Yes      ? Intervention Provide education and demonstration on how to check pulse in carotid and radial arteri

## 2021-11-17 NOTE — Progress Notes (Signed)
 Cardiac Individual Treatment Plan  Patient Details  Name: Gwendolyn King MRN: [id number] Date of Birth: 1940/10/16 Referring Provider:   Flowsheet Row Cardiac Rehab from 11/17/2021 in [facility] Cardiac and Pulmonary Rehab  Referring Provider End, Lonni MD       Initial Encounter Date:  Flowsheet Row Cardiac Rehab from 11/17/2021 in [facility] Cardiac and Pulmonary Rehab  Date 11/17/21       Visit Diagnosis: NSTEMI (non-ST elevated myocardial infarction) [facility])  S/P coronary artery stent placement  Patient's Home Medications on Admission:  Current Outpatient Medications:    acebutolol  (SECTRAL ) 200 MG capsule, Take 1 capsule (200 mg total) by mouth daily., Disp: 90 capsule, Rfl: 2   acetaminophen  (TYLENOL ) 500 MG tablet, Take 1,000 mg by mouth at bedtime., Disp: , Rfl:    amLODipine  (NORVASC ) 5 MG tablet, Take 1 tablet (5 mg total) by mouth daily., Disp: 30 tablet, Rfl: 0   aspirin  EC 81 MG tablet, Take 81 mg by mouth at bedtime. , Disp: , Rfl:    AZELASTINE HCL OP, Place 1 drop into both eyes 3 (three) times daily as needed (dry/irritated eyes.)., Disp: , Rfl:    Biotin 5 MG TABS, Take 5 mg by mouth daily., Disp: , Rfl:    calcium -vitamin D  (OSCAL WITH D) 500-200 MG-UNIT tablet, Take 1 tablet by mouth daily. , Disp: , Rfl:    cholecalciferol (VITAMIN D ) 25 MCG (1000 UNIT) tablet, Take 1,000 Units by mouth every evening., Disp: , Rfl:    clopidogrel  (PLAVIX ) 75 MG tablet, Take 1 tablet (75 mg total) by mouth daily with breakfast., Disp: 30 tablet, Rfl: 0   EPINEPHrine  0.3 mg/0.3 mL IJ SOAJ injection, Inject 0.3 mg into the muscle as needed for anaphylaxis., Disp: , Rfl:    Evolocumab  (REPATHA ) 140 MG/ML SOSY, Inject 140 mg into the skin every 14 (fourteen) days., Disp: 6 mL, Rfl: 1   Krill Oil 500 MG CAPS, Take 500 mg by mouth daily., Disp: , Rfl:    letrozole  (FEMARA ) 2.5 MG tablet, Take 1 tablet (2.5 mg total) by mouth daily., Disp: 90 tablet, Rfl: 0   levocetirizine (XYZAL ) 5 MG  tablet, Take 5 mg by mouth every evening. , Disp: , Rfl: 3   LEVOXYL  75 MCG tablet, Take 1 tablet by mouth once daily, Disp: 90 tablet, Rfl: 2   Multiple Vitamin (MULTIVITAMIN WITH MINERALS) TABS tablet, Take 1 tablet by mouth daily. Adult 50+, Disp: , Rfl:    polyethylene glycol powder (GLYCOLAX /MIRALAX ) powder, Take 17 g by mouth daily. 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   Cataract    Bil   Chicken pox    Chronic kidney disease    stage 3   Colon polyp    Constipation    Hyperlipidemia    Hypothyroidism    Palpitations    over 20 yeras ago   Personal history of radiation therapy    Thyroid  disease     Tobacco Use: Social History   Tobacco Use  Smoking Status Never  Smokeless Tobacco Never  Labs: Review Flowsheet        Latest Ref Rng & Units 05/03/2020 05/04/2021 06/03/2021 08/08/2021  Labs for ITP Cardiac and Pulmonary Rehab  Cholestrol 0 - 200 mg/dL 799   796      LDL (calc) 0 - 99 mg/dL 879   880      Direct LDL mg/dL   867.9   872.9    HDL-C >40 mg/dL 57.89   55.49      Trlycerides <150 mg/dL 808.9   800.9      Hemoglobin A1c 4.8 - 5.6 %         10/25/2021  Labs for ITP Cardiac and Pulmonary Rehab  Cholestrol 177    LDL (calc) 108    Direct LDL   HDL-C 40    Trlycerides 143    Hemoglobin A1c 5.2        Multiple values from one day are sorted in reverse-chronological order         Exercise Target Goals: Exercise Program Goal: Individual exercise prescription set using results from initial 6 min walk test and THRR while considering  patients activity barriers and safety.    Exercise Prescription Goal: Initial exercise prescription builds to 30-45 minutes a day of aerobic activity, 2-3 days per week.  Home exercise guidelines will be given to patient during program as part of exercise prescription that the participant will acknowledge.   Education: Aerobic Exercise: - Group verbal and visual presentation on the components of exercise prescription. Introduces F.I.T.T principle from ACSM for exercise prescriptions.  Reviews F.I.T.T. principles of aerobic exercise including progression. Written material given at graduation.   Education: Resistance Exercise: - Group verbal and visual presentation on the components of exercise prescription. Introduces F.I.T.T principle from ACSM for exercise prescriptions  Reviews F.I.T.T. principles of resistance exercise including progression. Written material given at graduation.    Education: Exercise & Equipment Safety: - Individual verbal instruction and demonstration of equipment use and safety with use of the equipment. Flowsheet Row Cardiac Rehab from 11/10/2021 in [facility] Cardiac and Pulmonary Rehab  Date 11/10/21  Educator [facility]  Instruction Review Code 1- Verbalizes Understanding       Education: Exercise Physiology & General Exercise Guidelines: - Group verbal and written instruction with models to review the exercise physiology of the cardiovascular system and associated critical values. Provides general exercise guidelines with specific guidelines to those with heart or lung disease.    Education: Flexibility, Balance, Mind/Body Relaxation: - Group verbal and visual presentation with interactive activity on the components of exercise prescription. Introduces F.I.T.T principle from ACSM for exercise prescriptions. Reviews F.I.T.T. principles of flexibility and balance exercise training including progression. Also discusses the mind body connection.  Reviews various relaxation techniques to help reduce and manage stress  (i.e. Deep breathing, progressive muscle relaxation, and visualization). Balance handout provided to take home. Written material given at graduation.   Activity Barriers & Risk Stratification:  Activity Barriers & Cardiac Risk Stratification - 11/17/21 1714       Activity Barriers & Cardiac Risk Stratification   Activity Barriers Arthritis;Deconditioning;Muscular Weakness;Other (comment);Right Knee Replacement    Comments Left hip pain    Cardiac Risk Stratification Moderate             6 Minute Walk:  6 Minute Walk     Row Name 11/17/21 1706         6 Minute Walk   Phase Initial     Distance 1140 feet  Walk Time 6 minutes     # of Rest Breaks 0     MPH 2.15     METS 1.96     RPE 13     Perceived Dyspnea  1     VO2 Peak 6.88     Symptoms Yes (comment)     Comments Left hip pain 1/10     Resting HR 62 bpm     Resting BP 128/72     Resting Oxygen Saturation  100 %     Exercise Oxygen Saturation  during 6 min walk 98 %     Max Ex. HR 86 bpm     Max Ex. BP 160/66     2 Minute Post BP 130/68              Oxygen Initial Assessment:   Oxygen Re-Evaluation:   Oxygen Discharge (Final Oxygen Re-Evaluation):   Initial Exercise Prescription:  Initial Exercise Prescription - 11/17/21 1700       Date of Initial Exercise RX and Referring Provider   Date 11/17/21    Referring Provider End, Lonni MD      Oxygen   Maintain Oxygen Saturation 88% or higher      Treadmill   MPH 2    Grade 0    Minutes 15    METs 2.53      Recumbant Bike   Level 1    RPM 60    Watts 20    Minutes 15    METs 1.9      NuStep   Level 2    SPM 80    Minutes 15    METs 1.9      REL-XR   Level 1    Speed 50    Minutes 15    METs 1.9      T5 Nustep   Level 1    SPM 80    Minutes 15    METs 1.9      Prescription Details   Frequency (times per week) 3    Duration Progress to 30 minutes of continuous aerobic without signs/symptoms of physical distress       Intensity   THRR 40-80% of Max Heartrate 94- 126    Ratings of Perceived Exertion 11-13    Perceived Dyspnea 0-4      Progression   Progression Continue to progress workloads to maintain intensity without signs/symptoms of physical distress.      Resistance Training   Training Prescription Yes    Weight 3 lb    Reps 10-15             Perform Capillary Blood Glucose checks as needed.  Exercise Prescription Changes:   Exercise Prescription Changes     Row Name 11/17/21 1700             Response to Exercise   Blood Pressure (Admit) 128/72       Blood Pressure (Exercise) 160/66       Blood Pressure (Exit) 130/68       Heart Rate (Admit) 62 bpm       Heart Rate (Exercise) 86 bpm       Heart Rate (Exit) 67 bpm       Oxygen Saturation (Admit) 100 %       Oxygen Saturation (Exercise) 98 %       Rating of Perceived Exertion (Exercise) 13       Perceived Dyspnea (Exercise) 1  Symptoms Left hip pain 1/10       Comments walk test results                Exercise Comments:   Exercise Goals and Review:   Exercise Goals     Row Name 11/17/21 1717             Exercise Goals   Increase Physical Activity Yes       Intervention Provide advice, education, support and counseling about physical activity/exercise needs.;Develop an individualized exercise prescription for aerobic and resistive training based on initial evaluation findings, risk stratification, comorbidities and participant's personal goals.       Expected Outcomes Short Term: Attend rehab on a regular basis to increase amount of physical activity.;Long Term: Add in home exercise to make exercise part of routine and to increase amount of physical activity.;Long Term: Exercising regularly at least 3-5 days a week.       Increase Strength and Stamina Yes       Intervention Develop an individualized exercise prescription for aerobic and resistive training based on initial evaluation findings, risk  stratification, comorbidities and participant's personal goals.;Provide advice, education, support and counseling about physical activity/exercise needs.       Expected Outcomes Short Term: Increase workloads from initial exercise prescription for resistance, speed, and METs.;Short Term: Perform resistance training exercises routinely during rehab and add in resistance training at home;Long Term: Improve cardiorespiratory fitness, muscular endurance and strength as measured by increased METs and functional capacity ( )       Able to understand and use rate of perceived exertion (RPE) scale Yes       Intervention Provide education and explanation on how to use RPE scale       Expected Outcomes Short Term: Able to use RPE daily in rehab to express subjective intensity level;Long Term:  Able to use RPE to guide intensity level when exercising independently       Able to understand and use Dyspnea scale Yes       Intervention Provide education and explanation on how to use Dyspnea scale       Expected Outcomes Short Term: Able to use Dyspnea scale daily in rehab to express subjective sense of shortness of breath during exertion;Long Term: Able to use Dyspnea scale to guide intensity level when exercising independently       Knowledge and understanding of Target Heart Rate Range (THRR) Yes       Intervention Provide education and explanation of THRR including how the numbers were predicted and where they are located for reference       Expected Outcomes Short Term: Able to state/look up THRR;Long Term: Able to use THRR to govern intensity when exercising independently;Short Term: Able to use daily as guideline for intensity in rehab       Able to check pulse independently Yes       Intervention Provide education and demonstration on how to check pulse in carotid and radial arteries.;Review the importance of being able to check your own pulse for safety during independent exercise       Expected Outcomes  Short Term: Able to explain why pulse checking is important during independent exercise;Long Term: Able to check pulse independently and accurately       Understanding of Exercise Prescription Yes       Intervention Provide education, explanation, and written materials on patient's individual exercise prescription       Expected Outcomes Short Term: Able to  explain program exercise prescription;Long Term: Able to explain home exercise prescription to exercise independently                Exercise Goals Re-Evaluation :   Discharge Exercise Prescription (Final Exercise Prescription Changes):  Exercise Prescription Changes - 11/17/21 1700       Response to Exercise   Blood Pressure (Admit) 128/72    Blood Pressure (Exercise) 160/66    Blood Pressure (Exit) 130/68    Heart Rate (Admit) 62 bpm    Heart Rate (Exercise) 86 bpm    Heart Rate (Exit) 67 bpm    Oxygen Saturation (Admit) 100 %    Oxygen Saturation (Exercise) 98 %    Rating of Perceived Exertion (Exercise) 13    Perceived Dyspnea (Exercise) 1    Symptoms Left hip pain 1/10    Comments walk test results             Nutrition:  Target Goals: Understanding of nutrition guidelines, daily intake of sodium 1500mg , cholesterol 200mg , calories 30% from fat and 7% or less from saturated fats, daily to have 5 or more servings of fruits and vegetables.  Education: All About Nutrition: -Group instruction provided by verbal, written material, interactive activities, discussions, models, and posters to present general guidelines for heart healthy nutrition including fat, fiber, MyPlate, the role of sodium in heart healthy nutrition, utilization of the nutrition label, and utilization of this knowledge for meal planning. Follow up email sent as well. Written material given at graduation.   Biometrics:  Pre Biometrics - 11/17/21 1703       Pre Biometrics   Height 5' 0.25 (1.53 m)    Weight 156 lb (70.8 kg)    BMI  (Calculated) 30.23    Single Leg Stand 10 seconds              Nutrition Therapy Plan and Nutrition Goals:  Nutrition Therapy & Goals - 11/17/21 1658       Intervention Plan   Intervention Prescribe, educate and counsel regarding individualized specific dietary modifications aiming towards targeted core components such as weight, hypertension, lipid management, diabetes, heart failure and other comorbidities.    Expected Outcomes Short Term Goal: Understand basic principles of dietary content, such as calories, fat, sodium, cholesterol and nutrients.;Short Term Goal: A plan has been developed with personal nutrition goals set during dietitian appointment.;Long Term Goal: Adherence to prescribed nutrition plan.             Nutrition Assessments:  MEDIFICTS Score Key: >=70 Need to make dietary changes  40-70 Heart Healthy Diet <= 40 Therapeutic Level Cholesterol Diet  Flowsheet Row Cardiac Rehab from 11/17/2021 in [facility] Cardiac and Pulmonary Rehab  Picture Your Plate Total Score on Admission 55      Picture Your Plate Scores: <59 Unhealthy dietary pattern with much room for improvement. 41-50 Dietary pattern unlikely to meet recommendations for good health and room for improvement. 51-60 More healthful dietary pattern, with some room for improvement.  >60 Healthy dietary pattern, although there may be some specific behaviors that could be improved.    Nutrition Goals Re-Evaluation:   Nutrition Goals Discharge (Final Nutrition Goals Re-Evaluation):   Psychosocial: Target Goals: Acknowledge presence or absence of significant depression and/or stress, maximize coping skills, provide positive support system. Participant is able to verbalize types and ability to use techniques and skills needed for reducing stress and depression.   Education: Stress, Anxiety, and Depression - Group verbal and visual presentation to define  topics covered.  Reviews how body is impacted by  stress, anxiety, and depression.  Also discusses healthy ways to reduce stress and to treat/manage anxiety and depression.  Written material given at graduation.   Education: Sleep Hygiene -Provides group verbal and written instruction about how sleep can affect your health.  Define sleep hygiene, discuss sleep cycles and impact of sleep habits. Review good sleep hygiene tips.    Initial Review & Psychosocial Screening:  Initial Psych Review & Screening - 11/10/21 1009       Initial Review   Current issues with None Identified;Current Stress Concerns    Comments Nyeli thinks her husband has dementia and is on of her stressors.      Family Dynamics   Good Support System? Yes    Concerns Recent loss of significant other    Comments Patient has three children andher husband she can look to for support.      Barriers   Psychosocial barriers to participate in program The patient should benefit from training in stress management and relaxation.;There are no identifiable barriers or psychosocial needs.      Screening Interventions   Interventions Encouraged to exercise;To provide support and resources with identified psychosocial needs;Provide feedback about the scores to participant    Expected Outcomes Short Term goal: Utilizing psychosocial counselor, staff and physician to assist with identification of specific Stressors or current issues interfering with healing process. Setting desired goal for each stressor or current issue identified.;Long Term Goal: Stressors or current issues are controlled or eliminated.;Short Term goal: Identification and review with participant of any Quality of Life or Depression concerns found by scoring the questionnaire.;Long Term goal: The participant improves quality of Life and PHQ9 Scores as seen by post scores and/or verbalization of changes             Quality of Life Scores:   Quality of Life - 11/17/21 1658       Quality of Life   Select  Quality of Life      Quality of Life Scores   Health/Function Pre 23.54 %    Socioeconomic Pre 27.36 %    Psych/Spiritual Pre 25 %    Family Pre 21.6 %    GLOBAL Pre 24.37 %            Scores of 19 and below usually indicate a poorer quality of life in these areas.  A difference of  2-3 points is a clinically meaningful difference.  A difference of 2-3 points in the total score of the Quality of Life Index has been associated with significant improvement in overall quality of life, self-image, physical symptoms, and general health in studies assessing change in quality of life.  PHQ-9: Review Flowsheet        08/31/2021 05/04/2021 05/18/2020 05/03/2020 01/27/2020  Depression screen PHQ 2/9  Decreased Interest 0 0 0 0 0   0  Down, Depressed, Hopeless 0 0 0 0 0   0  PHQ - 2 Score 0 0 0 0 0   0      Multiple values from one day are sorted in reverse-chronological order       Interpretation of Total Score  Total Score Depression Severity:  1-4 = Minimal depression, 5-9 = Mild depression, 10-14 = Moderate depression, 15-19 = Moderately severe depression, 20-27 = Severe depression   Psychosocial Evaluation and Intervention:  Psychosocial Evaluation - 11/10/21 1010       Psychosocial Evaluation & Interventions   Interventions  Encouraged to exercise with the program and follow exercise prescription;Relaxation education;Stress management education    Comments Karys thinks her husband has dementia and is on of her stressors.Patient has three children andher husband she can look to for support.    Expected Outcomes Short: Start [facility] to help with mood. Long: Maintain a healthy mental state    Continue Psychosocial Services  Follow up required by staff             Psychosocial Re-Evaluation:   Psychosocial Discharge (Final Psychosocial Re-Evaluation):   Vocational Rehabilitation: Provide vocational rehab assistance to qualifying candidates.   Vocational Rehab  Evaluation & Intervention:   Education: Education Goals: Education classes will be provided on a variety of topics geared toward better understanding of heart health and risk factor modification. Participant will state understanding/return demonstration of topics presented as noted by education test scores.  Learning Barriers/Preferences:  Learning Barriers/Preferences - 11/10/21 1008       Learning Barriers/Preferences   Learning Barriers None    Learning Preferences None             General Cardiac Education Topics:  AED/CPR: - Group verbal and written instruction with the use of models to demonstrate the basic use of the AED with the basic ABC's of resuscitation.   Anatomy and Cardiac Procedures: - Group verbal and visual presentation and models provide information about basic cardiac anatomy and function. Reviews the testing methods done to diagnose heart disease and the outcomes of the test results. Describes the treatment choices: Medical Management, Angioplasty, or Coronary Bypass Surgery for treating various heart conditions including Myocardial Infarction, Angina, Valve Disease, and Cardiac Arrhythmias.  Written material given at graduation.   Medication Safety: - Group verbal and visual instruction to review commonly prescribed medications for heart and lung disease. Reviews the medication, class of the drug, and side effects. Includes the steps to properly store meds and maintain the prescription regimen.  Written material given at graduation.   Intimacy: - Group verbal instruction through game format to discuss how heart and lung disease can affect sexual intimacy. Written material given at graduation..   Know Your Numbers and Heart Failure: - Group verbal and visual instruction to discuss disease risk factors for cardiac and pulmonary disease and treatment options.  Reviews associated critical values for Overweight/Obesity, Hypertension, Cholesterol, and Diabetes.   Discusses basics of heart failure: signs/symptoms and treatments.  Introduces Heart Failure Zone chart for action plan for heart failure.  Written material given at graduation.   Infection Prevention: - Provides verbal and written material to individual with discussion of infection control including proper hand washing and proper equipment cleaning during exercise session. Flowsheet Row Cardiac Rehab from 11/10/2021 in [facility] Cardiac and Pulmonary Rehab  Date 11/10/21  Educator [facility]  Instruction Review Code 1- Verbalizes Understanding       Falls Prevention: - Provides verbal and written material to individual with discussion of falls prevention and safety. Flowsheet Row Cardiac Rehab from 11/10/2021 in [facility] Cardiac and Pulmonary Rehab  Date 11/10/21  Educator [facility]  Instruction Review Code 1- Verbalizes Understanding       Other: -Provides group and verbal instruction on various topics (see comments)   Knowledge Questionnaire Score:  Knowledge Questionnaire Score - 11/17/21 1655       Knowledge Questionnaire Score   Pre Score 23/26: Angina, Nutrition             Core Components/Risk Factors/Patient Goals at Admission:  Personal Goals and Risk Factors  at Admission - 11/17/21 1718       Core Components/Risk Factors/Patient Goals on Admission    Weight Management Yes;Weight Loss    Intervention Weight Management: Develop a combined nutrition and exercise program designed to reach desired caloric intake, while maintaining appropriate intake of nutrient and fiber, sodium and fats, and appropriate energy expenditure required for the weight goal.;Weight Management: Provide education and appropriate resources to help participant work on and attain dietary goals.;Weight Management/Obesity: Establish reasonable short term and long term weight goals.;Obesity: Provide education and appropriate resources to help participant work on and attain dietary goals.    Admit Weight 156 lb (70.8 kg)     Goal Weight: Short Term 152 lb (68.9 kg)    Goal Weight: Long Term 146 lb (66.2 kg)    Expected Outcomes Short Term: Continue to assess and modify interventions until short term weight is achieved;Long Term: Adherence to nutrition and physical activity/exercise program aimed toward attainment of established weight goal;Weight Loss: Understanding of general recommendations for a balanced deficit meal plan, which promotes 1-2 lb weight loss per week and includes a negative energy balance of [phone] kcal/d;Understanding recommendations for meals to include 15-35% energy as protein, 25-35% energy from fat, 35-60% energy from carbohydrates, less than 200mg  of dietary cholesterol, 20-35 gm of total fiber daily;Understanding of distribution of calorie intake throughout the day with the consumption of 4-5 meals/snacks    Hypertension Yes    Intervention Provide education on lifestyle modifcations including regular physical activity/exercise, weight management, moderate sodium restriction and increased consumption of fresh fruit, vegetables, and low fat dairy, alcohol moderation, and smoking cessation.;Monitor prescription use compliance.    Expected Outcomes Short Term: Continued assessment and intervention until BP is < 140/56mm HG in hypertensive participants. < 130/26mm HG in hypertensive participants with diabetes, heart failure or chronic kidney disease.;Long Term: Maintenance of blood pressure at goal levels.    Lipids Yes    Intervention Provide education and support for participant on nutrition & aerobic/resistive exercise along with prescribed medications to achieve LDL 70mg , HDL >40mg .    Expected Outcomes Short Term: Participant states understanding of desired cholesterol values and is compliant with medications prescribed. Participant is following exercise prescription and nutrition guidelines.;Long Term: Cholesterol controlled with medications as prescribed, with individualized exercise RX and with  personalized nutrition plan. Value goals: LDL < 70mg , HDL > 40 mg.             Education:Diabetes - Individual verbal and written instruction to review signs/symptoms of diabetes, desired ranges of glucose level fasting, after meals and with exercise. Acknowledge that pre and post exercise glucose checks will be done for 3 sessions at entry of program.   Core Components/Risk Factors/Patient Goals Review:    Core Components/Risk Factors/Patient Goals at Discharge (Final Review):    ITP Comments:  ITP Comments     Row Name 11/10/21 1006 11/17/21 1652         ITP Comments Virtual Visit completed. Patient informed on EP and RD appointment and 6 Minute walk test. Patient also informed of patient health questionnaires on My Chart. Patient Verbalizes understanding. Visit diagnosis can be found in [facility] 10/24/2021. Completed and gym orientation. Initial ITP created and sent for review to Dr. Oneil Pinal, Medical Director.               Comments: Initial ITP

## 2021-11-18 ENCOUNTER — Ambulatory Visit (INDEPENDENT_AMBULATORY_CARE_PROVIDER_SITE_OTHER): Payer: PPO | Admitting: Nurse Practitioner

## 2021-11-18 ENCOUNTER — Encounter: Payer: Self-pay | Admitting: Nurse Practitioner

## 2021-11-18 ENCOUNTER — Other Ambulatory Visit
Admission: RE | Admit: 2021-11-18 | Discharge: 2021-11-18 | Disposition: A | Discharge status: Home / Self Care | Payer: PPO | Attending: Nurse Practitioner | Admitting: Nurse Practitioner

## 2021-11-18 VITALS — BP 112/58 | HR 63 | Ht 60.25 in | Wt 155.0 lb

## 2021-11-18 DIAGNOSIS — I214 Non-ST elevation (NSTEMI) myocardial infarction: Secondary | ICD-10-CM | POA: Diagnosis not present

## 2021-11-18 DIAGNOSIS — I255 Ischemic cardiomyopathy: Secondary | ICD-10-CM

## 2021-11-18 DIAGNOSIS — N183 Chronic kidney disease, stage 3 unspecified: Secondary | ICD-10-CM | POA: Diagnosis not present

## 2021-11-18 DIAGNOSIS — I10 Essential (primary) hypertension: Secondary | ICD-10-CM | POA: Diagnosis not present

## 2021-11-18 DIAGNOSIS — I2510 Atherosclerotic heart disease of native coronary artery without angina pectoris: Secondary | ICD-10-CM | POA: Diagnosis not present

## 2021-11-18 DIAGNOSIS — E785 Hyperlipidemia, unspecified: Secondary | ICD-10-CM

## 2021-11-18 LAB — BASIC METABOLIC PANEL WITH GFR
Anion gap: 8 (ref 5–15)
BUN: 28 mg/dL — ABNORMAL HIGH (ref 8–23)
CO2: 28 mmol/L (ref 22–32)
Calcium: 10.2 mg/dL (ref 8.9–10.3)
Chloride: 104 mmol/L (ref 98–111)
Creatinine, Ser: 0.97 mg/dL (ref 0.44–1.00)
GFR, Estimated: 60 mL/min — ABNORMAL LOW
Glucose, Bld: 99 mg/dL (ref 70–99)
Potassium: 4.3 mmol/L (ref 3.5–5.1)
Sodium: 140 mmol/L (ref 135–145)

## 2021-11-18 MED ORDER — CARVEDILOL 6.25 MG PO TABS: 6.2500 mg | ORAL_TABLET | Freq: Two times a day (BID) | ORAL | 3 refills | Status: DC

## 2021-11-18 MED ORDER — CLOPIDOGREL BISULFATE 75 MG PO TABS: 75.0000 mg | ORAL_TABLET | Freq: Every day | ORAL | 3 refills | Status: AC

## 2021-11-18 NOTE — Progress Notes (Signed)
 "   Office Visit    Patient Name: Gwendolyn King Date of Encounter: 11/18/2021  Primary Care Provider:  Maribeth Camellia MATSU, MD Primary Cardiologist:  Gwendolyn Lunger, MD  Chief Complaint    81 year old female with a history of palpitations, hypertension, obesity, hyperlipidemia (statin intolerant), aortic atherosclerosis, CKD 3, breast cancer, and stroke, who presents for follow-up after recent hospitalization for chest pain and finding of CAD status post stenting of the LAD and circumflex.  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   CAD (coronary artery disease)    a. 10/2021 NSTEMI/PCI: LM 34m/d, LAD 55ost, 99/ (2.5x15 Onyx Frontier DES), D2 90, LCX 80p/m (2.5x30 Onyx Frontier DES), RCA 50ost, 60p, 71m (nl iFR throughout). LVEDP 20-15mmHg.   Cataract    Bil   Chicken pox    Chronic HFrEF (heart failure with reduced ejection fraction) (HCC)    a. 10/2021 Echo: EF 35-40%, mod asymm LVH w/ sev mid-apical ant, apical HK. GrI DD. Nl RV fxn.   Chronic kidney disease    stage 3   Colon polyp    Constipation    Hyperlipidemia    Hypothyroidism    Ischemic cardiomyopathy    a. 10/2021 Echo: EF 35-40%.   Palpitations    over 20 yeras ago   Personal history of radiation therapy    Thyroid  disease    Past Surgical History:  Procedure Laterality Date   ABDOMINAL HYSTERECTOMY  1981   APPENDECTOMY  2012   BREAST BIOPSY Right 05/08/2017   Affirm Bx-DUCTAL CARCINOMA IN SITU (DCIS) WITH ONE FOCUS SUSPICIOUS FOR invasive   BREAST BIOPSY Right 05/17/2017   us  bx done at DR. Brynetts office, papillary carcinoma   BREAST BIOPSY Right 06/18/2019   Stereo bc calcs COMPATIBLE WITH PRIOR SURGICAL SITE CHANGES   BREAST LUMPECTOMY Right 06/07/2017    DUCTAL CARCINOMA IN SITU (DCIS) with microinvasion at 7:00 5 cmfn   BREAST LUMPECTOMY Right 06/07/2017   PAPILLARY LESION CONSISTENT WITH PAPILLARY CARCINOMA at retroaerolar 7:00 1.5 cmfn   BREAST LUMPECTOMY WITH SENTINEL LYMPH NODE BIOPSY Right 06/07/2017   Wide excision of 2 foci of high-grade DCIS, microinvasion noted only on core biopsy. 2 microscopic foci lateral inferior margins. Patient elected not to proceed to reexcision.   COLONOSCOPY     COLONOSCOPY W/ POLYPECTOMY     CORONARY STENT INTERVENTION N/A 10/25/2021   Procedure: CORONARY STENT INTERVENTION;  Surgeon: Gwendolyn Bruckner, MD;  Location: [facility];  Service: Cardiovascular;  Laterality: N/A;   CYSTOCELE REPAIR N/A 09/11/2019   Procedure: ANTERIOR COLPORRHAPHY;  Surgeon: Gwendolyn Lamar SQUIBB, MD;  Location: [facility] OR[facility];  Service: Gynecology;  Laterality: N/A;   FINGER FRACTURE SURGERY Right    5th   HAMMER TOE SURGERY  09/20/2017   INTRAVASCULAR PRESSURE WIRE/FFR STUDY N/A 10/25/2021   Procedure: INTRAVASCULAR PRESSURE WIRE/FFR STUDY;  Surgeon: Gwendolyn Bruckner, MD;  Location: [facility];  Service: Cardiovascular;  Laterality: N/A;   JOINT REPLACEMENT     right knee   LEFT HEART CATH AND CORONARY ANGIOGRAPHY N/A 10/25/2021   Procedure: LEFT HEART CATH AND CORONARY ANGIOGRAPHY;  Surgeon: Gwendolyn Bruckner, MD;  Location: [facility];  Service: Cardiovascular;  Laterality: N/A;   ROTATOR CUFF REPAIR Right    TONSILLECTOMY AND ADENOIDECTOMY  1963   TOTAL KNEE ARTHROPLASTY Right 10/17/2016   Procedure: TOTAL KNEE ARTHROPLASTY;  Surgeon: Gwendolyn Herald, MD;  Location: [facility];  Service: Orthopedics;  Laterality: Right;   VAGINAL DELIVERY     3   Allergies  Allergies  Allergen Reactions   Singulair [Montelukast] Itching   Lipitor [Atorvastatin ] Other (See Comments)    Myalgia, arthralgia   Simvastatin Other (See Comments)    myalgia    History of Present Illness    81 year old female with a history of  palpitations, hypertension, obesity, hyperlipidemia (statin intolerant), aortic atherosclerosis, CKD 3, breast cancer, and stroke.  In mid February, she accidentally struck her head on the side view mirror of a truck.  About 5 days later, she noted mild headache and right leg paresthesias, followed by memory issues.  An MRI of the brain on February 28 showed a 6 mm acute/early subacute infarct within the left thalamus as well as minimal chronic small vessel ischemic changes.  Arrangements were made to initiate PCSK9 inhibitor therapy in the setting of hyperlipidemia and statin intolerance.  Unfortunately, patient presented to the ED on March 6 after developing chest pain while sitting in bed.  Troponin elevated to 1282.  She was seen by our team.  Echo showed an EF of 35 to 40% with grade 1 diastolic dysfunction, and severe hypokinesis of the left ventricular, mid-apical anterior, and apical walls, concerning for LAD infarct.  Diagnostic catheterization was carried out and showed 99% stenosis in the mid LAD with a 90% stenosis in the second diagonal.  She also had an 80% stenosis in the proximal to mid left circumflex with abnormal fractional flow reserve.  She had nonobstructive disease throughout the right coronary.  She underwent drug-eluting stent placement to the mid LAD and proximal/mid left circumflex.  She was placed on guideline directed medical therapy in the setting of LV dysfunction and non-STEMI, and also required gentle diuresis in the setting of elevated LVEDP of 20 to 25 mmHg.  Post cath, creatinine rose to 1.22 and ACEi/ARB/arni/mra were avoided.  [name] was discharged home on March 8.  Since discharge, she has felt well.  She has not had any recurrent chest pain and denies dyspnea.  She has noted mild lower extremity swelling, just above her sock line since being placed on amlodipine .  She denies palpitations, PND, orthopnea, dizziness, syncope, edema, or early satiety.  She will need a refill  on her Plavix  today.  Home Medications    Prior to Admission medications   Medication Sig Start Date End Date Taking? Authorizing Provider  acebutolol  (SECTRAL ) 200 MG capsule Take 1 capsule (200 mg total) by mouth daily. 06/14/21  Yes Gwendolyn Camellia MATSU, MD  acetaminophen  (TYLENOL ) 500 MG tablet Take 1,000 mg by mouth at bedtime.   Yes [provider]  amLODipine  (NORVASC ) 5 MG tablet Take 1 tablet (5 mg total) by mouth daily. 10/27/21 11/26/21 Yes Trudy Anthony HERO, MD  aspirin  EC 81 MG tablet Take 81 mg by mouth at bedtime.    Yes [provider]  AZELASTINE HCL OP Place 1 drop into both eyes 3 (three) times daily as needed (dry/irritated eyes.).   Yes [provider]  Biotin 5 MG TABS Take 5 mg by mouth daily.   Yes [provider]  calcium -vitamin D  (OSCAL WITH D) 500-200 MG-UNIT tablet Take 1 tablet by mouth daily.    Yes  [provider]  cholecalciferol (VITAMIN D ) 25 MCG (1000 UNIT) tablet Take 1,000 Units by mouth every evening.   Yes [provider]  clopidogrel  (PLAVIX ) 75 MG tablet Take 1 tablet (75 mg total) by mouth daily with breakfast. 10/27/21 11/26/21 Yes Trudy Anthony HERO, MD  EPINEPHrine  0.3 mg/0.3 mL IJ SOAJ injection Inject 0.3 mg into the muscle as needed for anaphylaxis.   Yes [provider]  Evolocumab  (REPATHA ) 140 MG/ML SOSY Inject 140 mg into the skin every 14 (fourteen) days. 11/15/21  Yes Gwendolyn Camellia MATSU, MD  Krill Oil 500 MG CAPS Take 500 mg by mouth daily.   Yes [provider]  letrozole  (FEMARA ) 2.5 MG tablet Take 1 tablet (2.5 mg total) by mouth daily. 05/26/21  Yes Jacobo Gwendolyn PARAS, MD  levocetirizine (XYZAL ) 5 MG tablet Take 5 mg by mouth every evening.  05/09/18  Yes [provider]  LEVOXYL  75 MCG tablet Take 1 tablet by mouth once daily 06/14/21  Yes Gwendolyn Camellia MATSU, MD  Multiple Vitamin (MULTIVITAMIN WITH MINERALS) TABS tablet Take 1 tablet by mouth daily. Adult 50+   Yes  [provider]  polyethylene glycol powder (GLYCOLAX /MIRALAX ) powder Take 17 g by mouth daily. With coffee   Yes [provider]  vitamin B-12 (CYANOCOBALAMIN ) 1000 MCG tablet Take 1,000 mcg by mouth daily.   Yes [provider]       Review of Systems    She has noticed mild swelling of her sock line since starting Motifene.  She denies chest pain, palpitations, dyspnea, pnd, orthopnea, n, v, dizziness, syncope, weight gain, or early satiety.  All other systems reviewed and are otherwise negative except as noted above.    Physical Exam    VS:  BP (!) 112/58 (BP Location: Left Arm, Patient Position: Sitting, Cuff Size: Normal)   Pulse 63   Ht 5' 0.25 (1.53 m)   Wt 155 lb (70.3 kg)   SpO2 98%   BMI 30.02 kg/m  , BMI Body mass index is 30.02 kg/m.     GEN: Well nourished, well developed, in no acute distress. HEENT: normal. Neck: Supple, no JVD, carotid bruits, or masses. Cardiac: RRR, no murmurs, rubs, or gallops. No clubbing, cyanosis, trace bilateral lower extremity edema just above the sock line.  Radials/PT 2+ and equal bilaterally.  Right radial cath site without bleeding, bruit, or hematoma. Respiratory:  Respirations regular and unlabored, clear to auscultation bilaterally. GI: Soft, nontender, nondistended, BS + x 4. MS: no deformity or atrophy. Skin: warm and dry, no rash. Neuro:  Strength and sensation are intact. Psych: Normal affect.  Accessory Clinical Findings    ECG personally reviewed by me today -regular sinus rhythm, 63, anterolateral T wave inversion, similar to ECG during hospitalization- no acute changes.  Lab Results  Component Value Date   WBC 8.9 10/26/2021   HGB 14.9 10/26/2021   HCT 44.2 10/26/2021   MCV 85.5 10/26/2021   PLT 143 (L) 10/26/2021   Lab Results  Component Value Date   CREATININE 1.22 (H) 10/26/2021   BUN 18 10/26/2021   NA 141 10/26/2021   K 3.8 10/26/2021   CL 109 10/26/2021   CO2 24 10/26/2021    Lab Results  Component Value Date   ALT 16 10/25/2021   AST 33 10/25/2021   ALKPHOS 32 (L) 10/25/2021   BILITOT 0.6 10/25/2021   Lab Results  Component Value Date   CHOL 177 10/25/2021   HDL 40 (L) 10/25/2021  LDLCALC 108 (H) 10/25/2021   LDLDIRECT 127.0 08/08/2021   TRIG 143 10/25/2021   CHOLHDL 4.4 10/25/2021    Lab Results  Component Value Date   HGBA1C 5.2 10/25/2021    Assessment & Plan    1.  Non-STEMI, subsequent episode of care/CAD: Status post recent presentation with chest heaviness and troponin elevation consistent with acute coronary syndrome/non-STEMI.  Diagnostic catheterization revealed severe LAD and circumflex disease and both areas were treated with drug-eluting stents.  She has residual, moderate right coronary artery stenoses with normal fractional flow reserve noted during catheterization.  She has tolerated dual antiplatelet therapy well and has not had any recurrent chest pain or dyspnea.  Her right radial cath site has healed well.  We reviewed her medication list today, and I noted that she is on acebutolol , which she says she was started on many many years ago in the setting of palpitations.  In light of ischemic cardiomyopathy/reduced EF, we agreed to discontinue acebutolol  and start carvedilol  6.25 mg twice daily.  She is not on a statin due to prior intolerance of both statins and Zetia .  She is on Repatha  and tolerating well.  I will place an order for cardiac rehabilitation.  2.  Ischemic cardiomyopathy/chronic heart failure with reduced ejection fraction: In the setting of non-STEMI with severe LAD and circumflex disease, she was found to have an EF of 35 to 40%.  She had mild elevation of LVEDP at the time of catheterization.  She has not had any dyspnea, PND, or orthopnea since discharge.  She has had very mild/trace edema just above her sock lines.  It is possible that amlodipine  therapy is playing a role in her edema and I am going to discontinue this  today.  Switching acebutolol  for carvedilol .  I am going to follow-up a basic metabolic panel today given prior history of CKD 3 with slight rise in creatinine post catheterization to 1.22.  If creatinine stable, would have a low threshold to add low-dose ARB therapy.  We will plan follow-up echocardiogram in approximately 3 months.  3.  Essential hypertension: Blood pressure elevated during hospitalization prompting initiation of amlodipine .  This has resulted in trace lower extremity swelling.  I am discontinuing amlodipine  today as blood pressure is 112/58, and I am switching her acebutolol  to carvedilol  at 6.25 mg twice daily.  As above, pending renal function, if pressures elevate with adjustments, could consider ARB.  4.  Hyperlipidemia: She is statin and Zetia  intolerant however, she was recently placed on Repatha  and has tolerated well.  We will plan to follow-up lipids in 6 to 8 weeks.  5.  Recent left thalamic stroke.  No residual weakness or paresthesias.  She is now on aspirin  and Plavix  in the setting of recent ACS, as well as Repatha .  6.  Stage III chronic kidney disease: Creatinine slightly elevated following catheterization at 1.22.  We will follow-up today.  7.  Disposition: Follow-up basic metabolic panel today.  Follow-up in clinic in 1 month.  We will likely plan on follow-up lipids in the near future with follow-up echo in approximately 3 months.   Lonni Meager, NP 11/18/2021, 2:40 PM  "

## 2021-11-18 NOTE — Patient Instructions (Signed)
Medication Instructions:  ?Your physician has recommended you make the following change in your medication:  ? ?STOP Acebutolol ?STOP Amlodipine ?START Carvedilol 6.25 mg twice a day ? ? ?*If you need a refill on your cardiac medications before your next appointment, please call your pharmacy* ? ? ?Lab Work: ?BMET today. Go to [employer] and check in at registration.  ? ?If you have labs (blood work) drawn today and your tests are completely normal, you will receive your results only by: ?MyChart Message (if you have MyChart) OR ?A paper copy in the mail ?If you have any lab test that is abnormal or we need to change your treatment, we will call you to review the results. ? ? ?Testing/Procedures: ?None ? ? ?Follow-Up: ?At [facility], you and your health needs are our priority.  As part of our continuing mission to provide you with exceptional heart care, we have created designated Provider Care Teams.  These Care Teams include your primary Cardiologist (physician) and Advanced Practice Providers (APPs -  Physician Assistants and Nurse Practitioners) who all work together to provide you with the care you need, when you need it. ? ?We recommend signing up for the patient portal called "MyChart".  Sign up information is provided on this After Visit Summary.  MyChart is used to connect with patients for Virtual Visits (Telemedicine).  Patients are able to view lab/test results, encounter notes, upcoming appointments, etc.  Non-urgent messages can be sent to your provider as well.   ?To learn more about what you can do with MyChart, go to [email or web address].   ? ?Your next appointment:   ?1 month(s) ? ?The format for your next appointment:   ?In Person ? ?Provider:   ?Ida Rogue, MD or Murray Hodgkins, NP ?

## 2021-11-19 ENCOUNTER — Encounter: Payer: Self-pay | Admitting: Family Medicine

## 2021-11-21 ENCOUNTER — Other Ambulatory Visit: Payer: Self-pay | Admitting: *Deleted

## 2021-11-21 DIAGNOSIS — D0511 Intraductal carcinoma in situ of right breast: Secondary | ICD-10-CM

## 2021-11-21 MED ORDER — LETROZOLE 2.5 MG PO TABS: 2.5000 mg | ORAL_TABLET | Freq: Every day | ORAL | 0 refills | Status: DC

## 2021-11-23 ENCOUNTER — Encounter: Payer: PPO | Attending: Internal Medicine

## 2021-11-23 DIAGNOSIS — I252 Old myocardial infarction: Secondary | ICD-10-CM | POA: Insufficient documentation

## 2021-11-23 DIAGNOSIS — I214 Non-ST elevation (NSTEMI) myocardial infarction: Secondary | ICD-10-CM

## 2021-11-23 DIAGNOSIS — Z955 Presence of coronary angioplasty implant and graft: Secondary | ICD-10-CM | POA: Insufficient documentation

## 2021-11-23 NOTE — Progress Notes (Signed)
Daily Session Note ? ?Patient Details  ?Name: Gwendolyn King ?MRN: [id number] ?Date of Birth: [birth year] ?Referring Provider:   ?Flowsheet Row Cardiac Rehab from 11/17/2021 in [facility] Cardiac [facility] Re[facility]  ?Referring Provider End, Harrell Gave MD  ? ?  ? ? ?Encounter Date: 11/23/2021 ? ?Check In: ? Session Check In - 11/23/21 1427   ? ?  ? Check-In  ? Supervising physician immediately available to respond to emergencies See telemetry face sheet for immediately available ER MD   ? Location [facility]-Cardiac & Pulmonary Rehab   ? Staff Present Justin Mend, RCP,RRT,BSRT;Hamsa Laurich, MPA, RN;Melissa [place], RDN, Tawanna Solo, MS, ASCM CEP, Exercise Physiologist   ? Virtual Visit No   ? Medication changes reported     No   ? Fall or balance concerns reported    No   ? Warm-up and Cool-down Performed on first and last piece of equipment   ? Resistance Training Performed Yes   ? VAD Patient? No   ? PAD/SET Patient? No   ?  ? Pain Assessment  ? Currently in Pain? No/denies   ? ?  ?  ? ?  ? ? ? ? ? ?Social History  ? ?Tobacco Use  ?Smoking Status Never  ?Smokeless Tobacco Never  ? ? ?Goals Met:  ?Independence with exercise equipment ?Exercise tolerated well ?No report of concerns or symptoms today ?Strength training completed today ? ?Goals Unmet:  ?Not Applicable ? ?Comments: First full day of exercise!  Patient was oriented to gym and equipment including functions, settings, policies, and procedures.  Patient's individual exercise prescription and treatment plan were reviewed.  All starting workloads were established based on the results of the 6 minute walk test done at initial orientation visit.  The plan for exercise progression was also introduced and progression will be customized based on patient's performance and goals. ? ? ? ?Dr. Emily Filbert is Medical Director for [place].  ?Dr. Ottie Glazier is Medical Director for [facility] Pulmonary Rehabilitation. ?

## 2021-11-24 ENCOUNTER — Encounter: Payer: PPO | Admitting: *Deleted

## 2021-11-24 DIAGNOSIS — Z955 Presence of coronary angioplasty implant and graft: Secondary | ICD-10-CM | POA: Diagnosis not present

## 2021-11-24 DIAGNOSIS — I214 Non-ST elevation (NSTEMI) myocardial infarction: Secondary | ICD-10-CM

## 2021-11-24 NOTE — Progress Notes (Signed)
Daily Session Note ? ?Patient Details  ?Name: Gwendolyn King ?MRN: [id number] ?Date of Birth: [birth year] ?Referring Provider:   ?Flowsheet Row Cardiac Rehab from 11/17/2021 in [facility] Re[facility]  ?Referring Provider End, Harrell Gave MD  ? ?  ? ? ?Encounter Date: 11/24/2021 ? ?Check In: ? Session Check In - 11/24/21 1418   ? ?  ? Check-In  ? Supervising physician immediately available to respond to emergencies See telemetry face sheet for immediately available ER MD   ? Location [facility] & Pul[facility] Rehab   ? Staff Present Renita Papa, RN BSN;Joseph Abram, RCP,RRT,BSRT;Jessica [place], [place], [place], [place], CCET   ? Virtual Visit No   ? Medication changes reported     No   ? Fall or balance concerns reported    No   ? Warm-up and Cool-down Performed on first and last piece of equipment   ? Resistance Training Performed Yes   ? VAD Patient? No   ?  ? Pain Assessment  ? Currently in Pain? No/denies   ? ?  ?  ? ?  ? ? ? ? ? ?Social History  ? ?Tobacco Use  ?Smoking Status Never  ?Smokeless Tobacco Never  ? ? ?Goals Met:  ?Independence with exercise equipment ?Exercise tolerated well ?No report of concerns or symptoms today ?Strength training completed today ? ?Goals Unmet:  ?Not Applicable ? ?Comments: Pt able to follow exercise prescription today without complaint.  Will continue to monitor for progression. ? ? ? ?Dr. Emily Filbert is Medical Director for [place].  ?Dr. Ottie Glazier is Medical Director for [facility] Pulmonary Rehabilitation. ?

## 2021-11-25 ENCOUNTER — Other Ambulatory Visit: Payer: Self-pay | Admitting: *Deleted

## 2021-11-25 ENCOUNTER — Encounter: Payer: Self-pay | Admitting: Family Medicine

## 2021-11-25 MED ORDER — CARVEDILOL 6.25 MG PO TABS: 6.2500 mg | ORAL_TABLET | Freq: Two times a day (BID) | ORAL | 0 refills | Status: DC

## 2021-11-28 DIAGNOSIS — I214 Non-ST elevation (NSTEMI) myocardial infarction: Secondary | ICD-10-CM

## 2021-11-28 DIAGNOSIS — Z955 Presence of coronary angioplasty implant and graft: Secondary | ICD-10-CM

## 2021-11-28 MED ORDER — CARVEDILOL 6.25 MG PO TABS: 6.2500 mg | ORAL_TABLET | Freq: Two times a day (BID) | ORAL | 3 refills | Status: DC

## 2021-11-28 MED ORDER — CARVEDILOL 6.25 MG PO TABS: 6.2500 mg | ORAL_TABLET | Freq: Two times a day (BID) | ORAL | 0 refills | Status: DC

## 2021-11-28 NOTE — Progress Notes (Signed)
Daily Session Note ? ?Patient Details  ?Name: Gwendolyn King ?MRN: [id number] ?Date of Birth: 02/01/1941 ?Referring Provider:   ?Flowsheet Row Cardiac Rehab from 11/17/2021 in [facility] Card[facility]monary Re[facility]  ?Referring Provider End, Christopher MD  ? ?  ? ? ?Encounter Date: 11/28/2021 ? ?Check In: ? Session Check In - 11/28/21 1408   ? ?  ? Check-In  ? Supervising physician immediately available to respond to emergencies See telemetry face sheet for immediately available ER MD   ? Location [facility] & Pul[facility] Re[facility]   ? Staff Present Kelly Bollinger, MPA, RN;Kara Langdon, MS, ASCM CEP, Exercise Physiologist;Joseph Hood, RCP,RRT,BSRT   ? Virtual Visit No   ? Medication changes reported     No   ? Fall or balance concerns reported    No   ? Warm-up and Cool-down Performed on first and last piece of equipment   ? Resistance Training Performed Yes   ? VAD Patient? No   ? PAD/SET Patient? No   ?  ? Pain Assessment  ? Currently in Pain? No/denies   ? ?  ?  ? ?  ? ? ? ? ? ?Social History  ? ?Tobacco Use  ?Smoking Status Never  ?Smokeless Tobacco Never  ? ? ?Goals Met:  ?Independence with exercise equipment ?Exercise tolerated well ?No report of concerns or symptoms today ?Strength training completed today ? ?Goals Unmet:  ?Not Applicable ? ?Comments: Pt able to follow exercise prescription today without complaint.  Will continue to monitor for progression. ? ? ? ?Dr. Mark Miller is Medical Director for [facility].  ?Dr. Fuad Aleskerov is Medical Director for [facility] Pulmonary Rehabilitation. ?

## 2021-11-28 NOTE — Telephone Encounter (Signed)
Spoke with patient and reviewed her medications. Prescription was sent to mail order verses local pharmacy. Advised that I would send in 90 day supply to [facility] and then 90 day supply with refills for future refills. She was agreeable with this plan and had no further questions at this time.  ?

## 2021-11-30 ENCOUNTER — Encounter: Payer: PPO | Admitting: *Deleted

## 2021-11-30 DIAGNOSIS — Z955 Presence of coronary angioplasty implant and graft: Secondary | ICD-10-CM

## 2021-11-30 DIAGNOSIS — I214 Non-ST elevation (NSTEMI) myocardial infarction: Secondary | ICD-10-CM

## 2021-11-30 NOTE — Progress Notes (Signed)
Daily Session Note ? ?Patient Details  ?Name: Gwendolyn King ?MRN: [id number] ?Date of Birth: Dec 22, 1940 ?Referring Provider:   ?Flowsheet Row Cardiac Rehab from 11/17/2021 in [facility] Cardiac and Pulmonary Rehab  ?Referring Provider End, Harrell Gave MD  ? ?  ? ? ?Encounter Date: 11/30/2021 ? ?Check In: ? Session Check In - 11/30/21 1423   ? ?  ? Check-In  ? Supervising physician immediately available to respond to emergencies See telemetry face sheet for immediately available ER MD   ? Location [facility]-Cardiac & Pulmonary Rehab   ? Staff Present Nyoka Cowden, RN, BSN, Ardeth Sportsman, RDN, Tawanna Solo, MS, ASCM CEP, Exercise Physiologist   ? Virtual Visit No   ? Medication changes reported     No   ? Fall or balance concerns reported    No   ? Tobacco Cessation No Change   ? Warm-up and Cool-down Performed on first and last piece of equipment   ? Resistance Training Performed Yes   ? VAD Patient? No   ? PAD/SET Patient? No   ?  ? Pain Assessment  ? Currently in Pain? No/denies   ? ?  ?  ? ?  ? ? ? ? ? ?Social History  ? ?Tobacco Use  ?Smoking Status Never  ?Smokeless Tobacco Never  ? ? ?Goals Met:  ?Independence with exercise equipment ?Exercise tolerated well ?No report of concerns or symptoms today ? ?Goals Unmet:  ?Not Applicable ? ?Comments: Pt able to follow exercise prescription today without complaint.  Will continue to monitor for progression.  ? ? ?Dr. Emily Filbert is Medical Director for [place].  ?Dr. Ottie Glazier is Medical Director for [facility] Pulmonary Rehabilitation. ?

## 2021-12-01 ENCOUNTER — Encounter: Payer: PPO | Admitting: *Deleted

## 2021-12-01 DIAGNOSIS — J301 Allergic rhinitis due to pollen: Secondary | ICD-10-CM | POA: Diagnosis not present

## 2021-12-01 DIAGNOSIS — J3089 Other allergic rhinitis: Secondary | ICD-10-CM | POA: Diagnosis not present

## 2021-12-01 DIAGNOSIS — I214 Non-ST elevation (NSTEMI) myocardial infarction: Secondary | ICD-10-CM

## 2021-12-01 DIAGNOSIS — Z955 Presence of coronary angioplasty implant and graft: Secondary | ICD-10-CM

## 2021-12-01 DIAGNOSIS — J3081 Allergic rhinitis due to animal (cat) (dog) hair and dander: Secondary | ICD-10-CM | POA: Diagnosis not present

## 2021-12-01 NOTE — Progress Notes (Signed)
Daily Session Note ? ?Patient Details  ?Name: Gwendolyn King ?MRN: [id number] ?Date of Birth: [birth year] ?Referring Provider:   ?Flowsheet Row Cardiac Rehab from 11/17/2021 in [facility] Cardiac and Pulmonary Rehab  ?Referring Provider End, Harrell Gave MD  ? ?  ? ? ?Encounter Date: 12/01/2021 ? ?Check In: ? Session Check In - 12/01/21 1402   ? ?  ? Check-In  ? Supervising physician immediately available to respond to emergencies See telemetry face sheet for immediately available ER MD   ? Location [facility]-Cardiac & Pulmonary Rehab   ? Staff Present Renita Papa, RN BSN;Joseph Hood, RCP,RRT,BSRT;Laureen Dennison, [place], RRT, CPFT   ? Virtual Visit No   ? Medication changes reported     No   ? Fall or balance concerns reported    No   ? Warm-up and Cool-down Performed on first and last piece of equipment   ? Resistance Training Performed Yes   ? VAD Patient? No   ? PAD/SET Patient? No   ?  ? Pain Assessment  ? Currently in Pain? No/denies   ? ?  ?  ? ?  ? ? ? ? ? ?Social History  ? ?Tobacco Use  ?Smoking Status Never  ?Smokeless Tobacco Never  ? ? ?Goals Met:  ?Independence with exercise equipment ?Exercise tolerated well ?No report of concerns or symptoms today ?Strength training completed today ? ?Goals Unmet:  ?Not Applicable ? ?Comments: Pt able to follow exercise prescription today without complaint.  Will continue to monitor for progression. ? ? ? ?Dr. Emily Filbert is Medical Director for [place].  ?Dr. Ottie Glazier is Medical Director for [facility] Pulmonary Rehabilitation. ?

## 2021-12-05 DIAGNOSIS — I214 Non-ST elevation (NSTEMI) myocardial infarction: Secondary | ICD-10-CM

## 2021-12-05 DIAGNOSIS — Z955 Presence of coronary angioplasty implant and graft: Secondary | ICD-10-CM | POA: Diagnosis not present

## 2021-12-05 NOTE — Progress Notes (Signed)
Daily Session Note ? ?Patient Details  ?Name: Gwendolyn King ?MRN: [id number] ?Date of Birth: March 27, 1941 ?Referring Provider:   ?Flowsheet Row Cardiac Rehab from 11/17/2021 in [facility] Cardiac and Pulmonary Rehab  ?Referring Provider End, Harrell Gave MD  ? ?  ? ? ?Encounter Date: 12/05/2021 ? ?Check In: ? Session Check In - 12/05/21 1446   ? ?  ? Check-In  ? Supervising physician immediately available to respond to emergencies See telemetry face sheet for immediately available ER MD   ? Location [facility]-Cardiac & Pulmonary Rehab   ? Staff Present Birdie Sons, MPA, Nino Glow, MS, ASCM CEP, Exercise Physiologist;Joseph [place], [place]   ? Virtual Visit No   ? Medication changes reported     No   ? Fall or balance concerns reported    No   ? Tobacco Cessation No Change   ? Warm-up and Cool-down Performed on first and last piece of equipment   ? Resistance Training Performed Yes   ? VAD Patient? No   ? PAD/SET Patient? No   ?  ? Pain Assessment  ? Currently in Pain? No/denies   ? ?  ?  ? ?  ? ? ? ? ? ?Social History  ? ?Tobacco Use  ?Smoking Status Never  ?Smokeless Tobacco Never  ? ? ?Goals Met:  ?Independence with exercise equipment ?Exercise tolerated well ?No report of concerns or symptoms today ?Strength training completed today ? ?Goals Unmet:  ?Not Applicable ? ?Comments:Pt able to follow exercise prescription today without complaint.  Will continue to monitor for progression.  ? ? ?Dr. Emily Filbert is Medical Director for [place].  ?Dr. Ottie Glazier is Medical Director for [facility] Pulmonary Rehabilitation. ?

## 2021-12-06 DIAGNOSIS — I214 Non-ST elevation (NSTEMI) myocardial infarction: Secondary | ICD-10-CM

## 2021-12-06 NOTE — Progress Notes (Signed)
Completed initial RD consultation ?

## 2021-12-07 ENCOUNTER — Encounter: Payer: Self-pay | Admitting: *Deleted

## 2021-12-07 DIAGNOSIS — I214 Non-ST elevation (NSTEMI) myocardial infarction: Secondary | ICD-10-CM

## 2021-12-07 DIAGNOSIS — Z955 Presence of coronary angioplasty implant and graft: Secondary | ICD-10-CM

## 2021-12-07 NOTE — Progress Notes (Signed)
Cardiac Individual Treatment Plan ? ?Patient Details  ?Name: Gwendolyn King ?MRN: [id number] ?Date of Birth: March 21, 1941 ?Referring Provider:   ?Flowsheet Row Cardiac Rehab from 11/17/2021 in [facility] Cardiac and Pulmonary Rehab  ?Referring Provider End, Harrell Gave MD  ? ?  ? ? ?Initial Encounter Date:  ?Flowsheet Row Cardiac Rehab from 11/17/2021 in [facility] Cardiac and Pulmonary Rehab  ?Date 11/17/21  ? ?  ? ? ?Visit Diagnosis: NSTEMI (non-ST elevated myocardial infarction) ([place]) ? ?S/P coronary artery stent placement ? ?Patient's Home Medications on Admission: ? ?Current Outpatient Medications:  ?  acetaminophen (TYLENOL) 500 MG tablet, Take 1,000 mg by mouth at bedtime., Disp: , Rfl:  ?  aspirin EC 81 MG tablet, Take 81 mg by mouth at bedtime. , Disp: , Rfl:  ?  AZELASTINE HCL OP, Place 1 drop into both eyes 3 (three) times daily as needed (dry/irritated eyes.)., Disp: , Rfl:  ?  Biotin 5 MG TABS, Take 5 mg by mouth daily., Disp: , Rfl:  ?  calcium-vitamin D (OSCAL WITH D) 500-200 MG-UNIT tablet, Take 1 tablet by mouth daily. , Disp: , Rfl:  ?  carvedilol (COREG) 6.25 MG tablet, Take 1 tablet (6.25 mg total) by mouth 2 (two) times daily., Disp: 180 tablet, Rfl: 0 ?  [START ON 02/24/2022] carvedilol (COREG) 6.25 MG tablet, Take 1 tablet (6.25 mg total) by mouth 2 (two) times daily., Disp: 180 tablet, Rfl: 3 ?  cholecalciferol (VITAMIN D) 25 MCG (1000 UNIT) tablet, Take 1,000 Units by mouth every evening., Disp: , Rfl:  ?  clopidogrel (PLAVIX) 75 MG tablet, Take 1 tablet (75 mg total) by mouth daily with breakfast., Disp: 90 tablet, Rfl: 3 ?  EPINEPHrine 0.3 mg/0.3 mL IJ SOAJ injection, Inject 0.3 mg into the muscle as needed for anaphylaxis., Disp: , Rfl:  ?  Evolocumab (REPATHA) 140 MG/ML SOSY, Inject 140 mg into the skin every 14 (fourteen) days., Disp: 6 mL, Rfl: 1 ?  Krill Oil 500 MG CAPS, Take 500 mg by mouth daily., Disp: , Rfl:  ?  letrozole (FEMARA) 2.5 MG tablet, Take 1 tablet (2.5 mg total) by mouth daily., Disp: 90  tablet, Rfl: 0 ?  levocetirizine (XYZAL) 5 MG tablet, Take 5 mg by mouth every evening. , Disp: , Rfl: 3 ?  LEVOXYL 75 MCG tablet, Take 1 tablet by mouth once daily, Disp: 90 tablet, Rfl: 2 ?  Multiple Vitamin (MULTIVITAMIN WITH MINERALS) TABS tablet, Take 1 tablet by mouth daily. Adult 50+, Disp: , Rfl:  ?  polyethylene glycol powder (GLYCOLAX/MIRALAX) powder, Take 17 g by mouth daily. With coffee, Disp: , Rfl:  ?  vitamin B-12 (CYANOCOBALAMIN) 1000 MCG tablet, Take 1,000 mcg by mouth daily., Disp: , Rfl:  ? ?Past Medical History: ?Past Medical History:  ?Diagnosis Date  ? Allergy   ? hay fever, fall allergies  ? Arthritis   ? Asthma   ? as a child, dad was smoker  ? Breast cancer ([place]) 05/08/2017  ? 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  ? CAD (coronary artery disease)   ? a. 10/2021 NSTEMI/PCI: LM 31m/d, LAD 55ost, 99/ (2.5x15 Onyx Frontier DES), D2 90, LCX 80p/m (2.5x30 Onyx Frontier DES), RCA 50ost, 60p, 20m (nl iFR throughout). LVEDP 20-20mmHg.  ? Cataract   ? Bil  ? Chicken pox   ? Chronic HFrEF (heart failure with reduced ejection fraction) ([place])   ? a. 10/2021 Echo: EF 35-40%, mod asymm LVH w/ sev mid-apical  ant, apical HK. GrI DD. Nl RV fxn.  ? Chronic kidney disease   ? stage 3  ? Colon polyp   ? Constipation   ? Hyperlipidemia   ? Hypothyroidism   ? Ischemic cardiomyopathy   ? a. 10/2021 Echo: EF 35-40%.  ? Palpitations   ? over 20 yeras ago  ? Personal history of radiation therapy   ? Thyroid disease   ? ? ?Tobacco Use: ?Social History  ? ?Tobacco Use  ?Smoking Status Never  ?Smokeless Tobacco Never  ? ? ?Labs: ?Review Flowsheet   ? ?  ?  Latest Ref Rng & Units 05/03/2020 05/04/2021 06/03/2021 08/08/2021  ?Labs for [facility] Cardiac and Pulmonary Rehab  ?Cholestrol 0 - 200 mg/dL 200   203      ?LDL (calc) 0 - 99 mg/dL 120   119      ?Direct LDL mg/dL   132.0   127.0    ?HDL-C >40 mg/dL 42.10   44.50      ?Trlycerides <150 mg/dL 191.0   199.0       ?Hemoglobin A1c 4.8 - 5.6 %      ? ?  10/25/2021  ?Labs for [facility] Cardiac and Pulmonary Rehab  ?Cholestrol 177    ?LDL (calc) 108    ?Direct LDL   ?HDL-C 40    ?Trlycerides 143    ?Hemoglobin A1c 5.2    ?  ? ? Multiple values from one day are sorted in reverse-chronological order  ?  ?  ? ? ? ?Exercise Target Goals: ?Exercise Program Goal: ?Individual exercise prescription set using results from initial 6 min walk test and THRR while considering  patient?s activity barriers and safety.  ? ?Exercise Prescription Goal: ?Initial exercise prescription builds to 30-45 minutes a day of aerobic activity, 2-3 days per week.  Home exercise guidelines will be given to patient during program as part of exercise prescription that the participant will acknowledge. ? ? ?Education: Aerobic Exercise: ?- Group verbal and visual presentation on the components of exercise prescription. Introduces F.I.T.T principle from ACSM for exercise prescriptions.  Reviews F.I.T.T. principles of aerobic exercise including progression. Written material given at graduation. ? ? ?Education: Resistance Exercise: ?- Group verbal and visual presentation on the components of exercise prescription. Introduces F.I.T.T principle from ACSM for exercise prescriptions  Reviews F.I.T.T. principles of resistance exercise including progression. Written material given at graduation. ? ?  ?Education: Exercise & Equipment Safety: ?- Individual verbal instruction and demonstration of equipment use and safety with use of the equipment. ?Flowsheet Row Cardiac Rehab from 12/07/2021 in [facility] Cardiac and Pulmonary Rehab  ?Date 11/10/21  ?Educator [facility]  ?Instruction Review Code 1- Verbalizes Understanding  ? ?  ? ? ?Education: Exercise Physiology & General Exercise Guidelines: ?- Group verbal and written instruction with models to review the exercise physiology of the cardiovascular system and associated critical values. Provides general exercise guidelines with specific  guidelines to those with heart or lung disease.  ? ? ?Education: Flexibility, Balance, Mind/Body Relaxation: ?- Group verbal and visual presentation with interactive activity on the components of exercise prescription. Introduces F.I.T.T principle from ACSM for exercise prescriptions. Reviews F.I.T.T. principles of flexibility and balance exercise training including progression. Also discusses the mind body connection.  Reviews various relaxation techniques to help reduce and manage stress (i.e. Deep breathing, progressive muscle relaxation, and visualization). Balance handout provided to take home. Written material given at graduation. ? ? ?Activity Barriers & Risk Stratification: ? Activity Barriers & Cardiac Risk  Stratification - 11/17/21 1714   ? ?  ? Activity Barriers & Cardiac Risk Stratification  ? Activity Barriers Arthritis;Deconditioning;Muscular Weakness;Other (comment);Right Knee Replacement   ? Comments Left hip pain   ? Cardiac Risk Stratification Moderate   ? ?  ?  ? ?  ? ? ?6 Minute Walk: ? 6 Minute Walk   ? ? [place] Name 11/17/21 1706  ?  ?  ?  ? 6 Minute Walk  ? Phase Initial    ? Distance 1140 feet    ? Walk Time 6 minutes    ? # of Rest Breaks 0    ? MPH 2.15    ? METS 1.96    ? RPE 13    ? Perceived Dyspnea  1    ? VO2 Peak 6.88    ? Symptoms Yes (comment)    ? Comments Left hip pain 1/10    ? Resting HR 62 bpm    ? Resting BP 128/72    ? Resting Oxygen Saturation  100 %    ? Exercise Oxygen Saturation  during 6 min walk 98 %    ? Max Ex. HR 86 bpm    ? Max Ex. BP 160/66    ? 2 Minute Post BP 130/68    ? ?  ?  ? ?  ? ? ?Oxygen Initial Assessment: ? ? ?Oxygen Re-Evaluation: ? ? ?Oxygen Discharge (Final Oxygen Re-Evaluation): ? ? ?Initial Exercise Prescription: ? Initial Exercise Prescription - 11/17/21 1700   ? ?  ? Date of Initial Exercise RX and Referring Provider  ? Date 11/17/21   ? Referring Provider End, Harrell Gave MD   ?  ? Oxygen  ? Maintain Oxygen Saturation 88% or higher   ?  ? Treadmill   ? MPH 2   ? Grade 0   ? Minutes 15   ? METs 2.53   ?  ? Recumbant Bike  ? Level 1   ? RPM 60   ? Watts 20   ? Minutes 15   ? METs 1.9   ?  ? NuStep  ? Level 2   ? SPM 80   ? Minutes 15   ? METs 1.9   ?  ? REL-XR  ? [place]

## 2021-12-07 NOTE — Progress Notes (Signed)
Daily Session Note ? ?Patient Details  ?Name: LILLYMAE DUET ?MRN: [id number] ?Date of Birth: December 13, 1940 ?Referring Provider:   ?Flowsheet Row Cardiac Rehab from 11/17/2021 in [facility] Cardiac and Pulmonary Rehab  ?Referring Provider End, Harrell Gave MD  ? ?  ? ? ?Encounter Date: 12/07/2021 ? ?Check In: ? Session Check In - 12/07/21 0924   ? ?  ? Check-In  ? Supervising physician immediately available to respond to emergencies See telemetry face sheet for immediately available ER MD   ? Location [facility]-Cardiac & Pulmonary Rehab   ? Staff Present Birdie Sons, MPA, RN;Joseph [place], RCP,RRT,BSRT;Amanda Sommer, BA, ACSM CEP, Exercise Physiologist   ? Virtual Visit No   ? Medication changes reported     No   ? Fall or balance concerns reported    No   ? Tobacco Cessation No Change   ? Warm-up and Cool-down Performed on first and last piece of equipment   ? Resistance Training Performed Yes   ? VAD Patient? No   ? PAD/SET Patient? No   ?  ? Pain Assessment  ? Currently in Pain? No/denies   ? ?  ?  ? ?  ? ? ? ? ? ?Social History  ? ?Tobacco Use  ?Smoking Status Never  ?Smokeless Tobacco Never  ? ? ?Goals Met:  ?Independence with exercise equipment ?Exercise tolerated well ?No report of concerns or symptoms today ?Strength training completed today ? ?Goals Unmet:  ?Not Applicable ? ?Comments: Pt able to follow exercise prescription today without complaint.  Will continue to monitor for progression. ? ? ? ?Dr. Emily Filbert is Medical Director for [place].  ?Dr. Ottie Glazier is Medical Director for [facility] Pulmonary Rehabilitation. ?

## 2021-12-08 ENCOUNTER — Encounter: Payer: PPO | Admitting: *Deleted

## 2021-12-08 DIAGNOSIS — Z955 Presence of coronary angioplasty implant and graft: Secondary | ICD-10-CM | POA: Diagnosis not present

## 2021-12-08 DIAGNOSIS — I214 Non-ST elevation (NSTEMI) myocardial infarction: Secondary | ICD-10-CM

## 2021-12-08 DIAGNOSIS — J3089 Other allergic rhinitis: Secondary | ICD-10-CM | POA: Diagnosis not present

## 2021-12-08 DIAGNOSIS — J3081 Allergic rhinitis due to animal (cat) (dog) hair and dander: Secondary | ICD-10-CM | POA: Diagnosis not present

## 2021-12-08 NOTE — Progress Notes (Signed)
Daily Session Note ? ?Patient Details  ?Name: Gwendolyn King ?MRN: [id number] ?Date of Birth: 1940/11/27 ?Referring Provider:   ?Flowsheet Row Cardiac Rehab from 11/17/2021 in [facility] Cardiac and Pulmonary Rehab  ?Referring Provider End, Harrell Gave MD  ? ?  ? ? ?Encounter Date: 12/08/2021 ? ?Check In: ? Session Check In - 12/08/21 1403   ? ?  ? Check-In  ? Supervising physician immediately available to respond to emergencies See telemetry face sheet for immediately available ER MD   ? Location [facility]-Cardiac & Pulmonary Rehab   ? Staff Present Renita Papa, RN BSN;Joseph [place], RCP,RRT,BSRT;Jessica [place], [place], [place], [place], CCET   ? Virtual Visit No   ? Medication changes reported     No   ? Fall or balance concerns reported    No   ? Warm-up and Cool-down Performed on first and last piece of equipment   ? Resistance Training Performed Yes   ? VAD Patient? No   ? PAD/SET Patient? No   ?  ? Pain Assessment  ? Currently in Pain? No/denies   ? ?  ?  ? ?  ? ? ? ? ? ?Social History  ? ?Tobacco Use  ?Smoking Status Never  ?Smokeless Tobacco Never  ? ? ?Goals Met:  ?Independence with exercise equipment ?Exercise tolerated well ?No report of concerns or symptoms today ?Strength training completed today ? ?Goals Unmet:  ?Not Applicable ? ?Comments: Pt able to follow exercise prescription today without complaint.  Will continue to monitor for progression. ? ? ? ?Dr. Emily Filbert is Medical Director for [place].  ?Dr. Ottie Glazier is Medical Director for [facility] Pulmonary Rehabilitation. ?

## 2021-12-09 ENCOUNTER — Other Ambulatory Visit (INDEPENDENT_AMBULATORY_CARE_PROVIDER_SITE_OTHER): Payer: PPO

## 2021-12-09 DIAGNOSIS — E782 Mixed hyperlipidemia: Secondary | ICD-10-CM | POA: Diagnosis not present

## 2021-12-09 DIAGNOSIS — I639 Cerebral infarction, unspecified: Secondary | ICD-10-CM

## 2021-12-09 LAB — HEPATIC FUNCTION PANEL
ALT: 19 U/L (ref 0–35)
AST: 22 U/L (ref 0–37)
Albumin: 4.3 g/dL (ref 3.5–5.2)
Alkaline Phosphatase: 26 U/L — ABNORMAL LOW (ref 39–117)
Bilirubin, Direct: 0.1 mg/dL (ref 0.0–0.3)
Total Bilirubin: 0.6 mg/dL (ref 0.2–1.2)
Total Protein: 6.4 g/dL (ref 6.0–8.3)

## 2021-12-09 LAB — LIPID PANEL
Cholesterol: 114 mg/dL (ref 0–200)
HDL: 47.2 mg/dL
LDL Cholesterol: 37 mg/dL (ref 0–99)
NonHDL: 66.57
Total CHOL/HDL Ratio: 2
Triglycerides: 148 mg/dL (ref 0.0–149.0)
VLDL: 29.6 mg/dL (ref 0.0–40.0)

## 2021-12-09 LAB — HEMOGLOBIN A1C: Hgb A1c MFr Bld: 5.1 % (ref 4.6–6.5)

## 2021-12-12 DIAGNOSIS — Z955 Presence of coronary angioplasty implant and graft: Secondary | ICD-10-CM | POA: Diagnosis not present

## 2021-12-12 DIAGNOSIS — I214 Non-ST elevation (NSTEMI) myocardial infarction: Secondary | ICD-10-CM

## 2021-12-12 NOTE — Progress Notes (Signed)
Daily Session Note ? ?Patient Details  ?Name: Gwendolyn King ?MRN: [id number] ?Date of Birth: 08/17/1941 ?Referring Provider:   ?Flowsheet Row Cardiac Rehab from 11/17/2021 in [facility] Cardiac and Pulmonary Rehab  ?Referring Provider End, Harrell Gave MD  ? ?  ? ? ?Encounter Date: 12/12/2021 ? ?Check In: ? Session Check In - 12/12/21 1418   ? ?  ? Check-In  ? Supervising physician immediately available to respond to emergencies See telemetry face sheet for immediately available ER MD   ? Location [facility]-Cardiac & Pulmonary Rehab   ? Staff Present Birdie Sons, MPA, RN;Joseph [place], RCP,RRT,BSRT;Kara [place], MS, ASCM CEP, Exercise Physiologist   ? Virtual Visit No   ? Medication changes reported     No   ? Fall or balance concerns reported    No   ? Tobacco Cessation No Change   ? Warm-up and Cool-down Performed on first and last piece of equipment   ? Resistance Training Performed Yes   ? VAD Patient? No   ? PAD/SET Patient? No   ?  ? Pain Assessment  ? Currently in Pain? No/denies   ? ?  ?  ? ?  ? ? ? ? ? ?Social History  ? ?Tobacco Use  ?Smoking Status Never  ?Smokeless Tobacco Never  ? ? ?Goals Met:  ?Independence with exercise equipment ?Exercise tolerated well ?No report of concerns or symptoms today ?Strength training completed today ? ?Goals Unmet:  ?Not Applicable ? ?Comments: Pt able to follow exercise prescription today without complaint.  Will continue to monitor for progression. ? ? ? ?Dr. Emily Filbert is Medical Director for [place].  ?Dr. Ottie Glazier is Medical Director for [facility] Pulmonary Rehabilitation. ?

## 2021-12-14 DIAGNOSIS — Z955 Presence of coronary angioplasty implant and graft: Secondary | ICD-10-CM | POA: Diagnosis not present

## 2021-12-14 DIAGNOSIS — I214 Non-ST elevation (NSTEMI) myocardial infarction: Secondary | ICD-10-CM

## 2021-12-14 NOTE — Progress Notes (Signed)
Daily Session Note ? ?Patient Details  ?Name: Gwendolyn King ?MRN: [id number] ?Date of Birth: [birth year] ?Referring Provider:   ?Flowsheet Row Cardiac Rehab from 11/17/2021 in [facility] Cardiac and Pulmonary Rehab  ?Referring Provider End, Harrell Gave MD  ? ?  ? ? ?Encounter Date: 12/14/2021 ? ?Check In: ? Session Check In - 12/14/21 1402   ? ?  ? Check-In  ? Supervising physician immediately available to respond to emergencies See telemetry face sheet for immediately available ER MD   ? Location [facility]-Cardiac & Pulmonary Rehab   ? Staff Present Birdie Sons, MPA, Nino Glow, MS, ASCM CEP, Exercise Physiologist;Joseph [place], [place]   ? Virtual Visit No   ? Medication changes reported     No   ? Fall or balance concerns reported    No   ? Tobacco Cessation No Change   ? Warm-up and Cool-down Performed on first and last piece of equipment   ? Resistance Training Performed Yes   ? VAD Patient? No   ? PAD/SET Patient? No   ?  ? Pain Assessment  ? Currently in Pain? No/denies   ? ?  ?  ? ?  ? ? ? ? ? ?Social History  ? ?Tobacco Use  ?Smoking Status Never  ?Smokeless Tobacco Never  ? ? ?Goals Met:  ?Independence with exercise equipment ?Exercise tolerated well ?No report of concerns or symptoms today ?Strength training completed today ? ?Goals Unmet:  ?Not Applicable ? ?Comments: Pt able to follow exercise prescription today without complaint.  Will continue to monitor for progression. ? ? ? ?Dr. Emily Filbert is Medical Director for [place].  ?Dr. Ottie Glazier is Medical Director for [facility] Pulmonary Rehabilitation. ?

## 2021-12-15 DIAGNOSIS — I214 Non-ST elevation (NSTEMI) myocardial infarction: Secondary | ICD-10-CM

## 2021-12-15 DIAGNOSIS — J301 Allergic rhinitis due to pollen: Secondary | ICD-10-CM | POA: Diagnosis not present

## 2021-12-15 DIAGNOSIS — J3081 Allergic rhinitis due to animal (cat) (dog) hair and dander: Secondary | ICD-10-CM | POA: Diagnosis not present

## 2021-12-15 DIAGNOSIS — J3089 Other allergic rhinitis: Secondary | ICD-10-CM | POA: Diagnosis not present

## 2021-12-15 DIAGNOSIS — Z955 Presence of coronary angioplasty implant and graft: Secondary | ICD-10-CM | POA: Diagnosis not present

## 2021-12-15 NOTE — Progress Notes (Signed)
Daily Session Note ? ?Patient Details  ?Name: Gwendolyn King ?MRN: [id number] ?Date of Birth: [birth year] ?Referring Provider:   ?Flowsheet Row Cardiac Rehab from 11/17/2021 in [facility] Cardiac and Pulmonary Rehab  ?Referring Provider End, Harrell Gave MD  ? ?  ? ? ?Encounter Date: 12/15/2021 ? ?Check In: ? Session Check In - 12/15/21 1424   ? ?  ? Check-In  ? Supervising physician immediately available to respond to emergencies See telemetry face sheet for immediately available ER MD   ? Location [facility]-Cardiac & Pulmonary Rehab   ? Staff Present Alberteen Sam, MA, RCEP, CCRP, CCET;Kripa Foskey, RN, BSN;Joseph [place], [place]   ? Virtual Visit No   ? Medication changes reported     No   ? Fall or balance concerns reported    No   ? Tobacco Cessation No Change   ? Warm-up and Cool-down Performed on first and last piece of equipment   ? Resistance Training Performed Yes   ? VAD Patient? No   ? PAD/SET Patient? No   ?  ? Pain Assessment  ? Currently in Pain? No/denies   ? ?  ?  ? ?  ? ? ? ? ? ?Social History  ? ?Tobacco Use  ?Smoking Status Never  ?Smokeless Tobacco Never  ? ? ?Goals Met:  ?Independence with exercise equipment ?Exercise tolerated well ?No report of concerns or symptoms today ?Strength training completed today ? ?Goals Unmet:  ?Not Applicable ? ?Comments: Pt able to follow exercise prescription today without complaint.  Will continue to monitor for progression. ? ? ?Dr. Emily Filbert is Medical Director for [place].  ?Dr. Ottie Glazier is Medical Director for [facility] Pulmonary Rehabilitation. ?

## 2021-12-19 ENCOUNTER — Encounter: Payer: PPO | Attending: Internal Medicine

## 2021-12-19 DIAGNOSIS — Z955 Presence of coronary angioplasty implant and graft: Secondary | ICD-10-CM | POA: Diagnosis present

## 2021-12-19 DIAGNOSIS — I214 Non-ST elevation (NSTEMI) myocardial infarction: Secondary | ICD-10-CM

## 2021-12-19 DIAGNOSIS — I252 Old myocardial infarction: Secondary | ICD-10-CM | POA: Diagnosis present

## 2021-12-19 NOTE — Progress Notes (Signed)
Daily Session Note ? ?Patient Details  ?Name: Gwendolyn King ?MRN: [id number] ?Date of Birth: 06-Jul-1941 ?Referring Provider:   ?Flowsheet Row Cardiac Rehab from 11/17/2021 in [facility] Cardiac and Pulmonary Rehab  ?Referring Provider End, Harrell Gave MD  ? ?  ? ? ?Encounter Date: 12/19/2021 ? ?Check In: ? Session Check In - 12/19/21 1411   ? ?  ? Check-In  ? Supervising physician immediately available to respond to emergencies See telemetry face sheet for immediately available ER MD   ? Location [facility]-Cardiac & Pulmonary Rehab   ? Staff Present Birdie Sons, MPA, RN;Joseph [place], [place]ren [place], MS, ASCM CEP, Exercise Physiologist;Jessica Luan [place], MA, RCEP, CCRP, CCET   ? Virtual Visit No   ? Medication changes reported     No   ? Fall or balance concerns reported    No   ? Tobacco Cessation No Change   ? Warm-up and Cool-down Performed on first and last piece of equipment   ? Resistance Training Performed Yes   ? VAD Patient? No   ? PAD/SET Patient? No   ?  ? Pain Assessment  ? Currently in Pain? No/denies   ? ?  ?  ? ?  ? ? ? ? ? ?Social History  ? ?Tobacco Use  ?Smoking Status Never  ?Smokeless Tobacco Never  ? ? ?Goals Met:  ?Independence with exercise equipment ?Exercise tolerated well ?No report of concerns or symptoms today ?Strength training completed today ? ?Goals Unmet:  ?Not Applicable ? ?Comments: Pt able to follow exercise prescription today without complaint.  Will continue to monitor for progression. ? ? ? ? ? ?Dr. Emily Filbert is Medical Director for [place].  ?Dr. Ottie Glazier is Medical Director for [facility] Pulmonary Rehabilitation. ?

## 2021-12-21 DIAGNOSIS — I252 Old myocardial infarction: Secondary | ICD-10-CM | POA: Diagnosis not present

## 2021-12-21 DIAGNOSIS — I214 Non-ST elevation (NSTEMI) myocardial infarction: Secondary | ICD-10-CM

## 2021-12-21 DIAGNOSIS — Z955 Presence of coronary angioplasty implant and graft: Secondary | ICD-10-CM

## 2021-12-21 NOTE — Progress Notes (Signed)
 Cardiology Office Note  Date:  12/23/2021   ID:  Gwendolyn King, DOB 14-Jul-1941, MRN [id number]  PCP:  Maribeth Camellia MATSU, MD   Chief Complaint  Patient presents with   1 month follow up     Doing well. Medications reviewed by the patient verbally.     HPI:  Gwendolyn King is a 81 year-old woman with long history of palpitations,  hyperlipidemia,  Aortic atherosclerosis on CT scan 2012 leg edema treated with Lasix  as needed,  CAD, Stent placed to left circumflex and LAD Ejection fraction 35 to 40% October 24, 2021 who presents for follow-up of her palpitations, non-STEMI, stent placement  Last seen by myself in clinic September 2018 Seen by one of our providers November 18, 2021 Amlodipine  held, carvedilol  started, cardiac rehab ordered following non-STEMI  Recent hospital records and procedures reviewed Non-STEMI October 24, 2021 Echo showed an EF of 35 to 40% with grade 1 diastolic dysfunction, and severe hypokinesis of the left ventricular, mid-apical anterior, and apical walls, concerning for LAD infarct.   Stent placed to left circumflex and LAD  Diagnostic catheterization was carried out and showed 99% stenosis in the mid LAD with a 90% stenosis in the second diagonal.  She also had an 80% stenosis in the proximal to mid left circumflex with abnormal fractional flow reserve.  She had nonobstructive disease throughout the right coronary.  She underwent drug-eluting stent placement to the mid LAD and proximal/mid left circumflex.     Post cath, creatinine rose to 1.22 and ACEi/ARB/arni/mra were avoided.  History of statin and Zetia  intolerance On Repatha  Left thalamic stroke history, on aspirin  Plavix  Repatha   Lab work reviewed A1c 5.1, total cholesterol 114 LDL 37  Doing cardiac rehab, I am doing ok Denies shortness of breath or chest pain on exertion  Husband with early dementia, unmotivated to exercise, legs getting weaker  EKG personally reviewed by myself on todays  visit NSR rate 65 no ST or T wave changes  Other past medical history simvastatin and had myalgias    PMH:   has a past medical history of Allergy, Arthritis, Asthma, Breast cancer (HCC) (05/08/2017), CAD (coronary artery disease), Cataract, Chicken pox, Chronic HFrEF (heart failure with reduced ejection fraction) (HCC), Chronic kidney disease, Colon polyp, Constipation, Hyperlipidemia, Hypothyroidism, Ischemic cardiomyopathy, Palpitations, Personal history of radiation therapy, and Thyroid  disease.  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 2 microscopic foci lateral inferior margins. Patient elected not to proceed to reexcision.   COLONOSCOPY     COLONOSCOPY W/ POLYPECTOMY     CORONARY STENT INTERVENTION N/A 10/25/2021   Procedure: CORONARY STENT INTERVENTION;  Surgeon: Mady Bruckner, MD;  Location: [facility];  Service: Cardiovascular;  Laterality:  N/A;   CYSTOCELE REPAIR N/A 09/11/2019   Procedure: ANTERIOR COLPORRHAPHY;  Surgeon: Arloa Lamar SQUIBB, MD;  Location: [facility] ORS;  Service: Gynecology;  Laterality: N/A;   FINGER FRACTURE SURGERY Right    5th   HAMMER TOE SURGERY  09/20/2017   INTRAVASCULAR PRESSURE WIRE/FFR STUDY N/A  10/25/2021   Procedure: INTRAVASCULAR PRESSURE WIRE/FFR STUDY;  Surgeon: Mady Bruckner, MD;  Location: [facility];  Service: Cardiovascular;  Laterality: N/A;   JOINT REPLACEMENT     right knee   LEFT HEART CATH AND CORONARY ANGIOGRAPHY N/A 10/25/2021   Procedure: LEFT HEART CATH AND CORONARY ANGIOGRAPHY;  Surgeon: Mady Bruckner, MD;  Location: [facility];  Service: Cardiovascular;  Laterality: N/A;   ROTATOR CUFF REPAIR Right    TONSILLECTOMY AND ADENOIDECTOMY  1963   TOTAL KNEE ARTHROPLASTY Right 10/17/2016   Procedure: TOTAL KNEE ARTHROPLASTY;  Surgeon: Maude Herald, MD;  Location: [facility];  Service: Orthopedics;  Laterality: Right;   VAGINAL DELIVERY     3   Current Outpatient Medications on File Prior to Visit  Medication Sig Dispense Refill   acetaminophen  (TYLENOL ) 500 MG tablet Take 1,000 mg by mouth at bedtime.     aspirin  EC 81 MG tablet Take 81 mg by mouth at bedtime.      AZELASTINE HCL OP Place 1 drop into both eyes 3 (three) times daily as needed (dry/irritated eyes.).     Biotin 5 MG TABS Take 5 mg by mouth daily.     calcium -vitamin D  (OSCAL WITH D) 500-200 MG-UNIT tablet Take 1 tablet by mouth daily.      carvedilol  (COREG ) 6.25 MG tablet Take 1 tablet (6.25 mg total) by mouth 2 (two) times daily. 180 tablet 0   cholecalciferol (VITAMIN D ) 25 MCG (1000 UNIT) tablet Take 1,000 Units by mouth every evening.     clopidogrel  (PLAVIX ) 75 MG tablet Take 75 mg by mouth daily.     Evolocumab  (REPATHA ) 140 MG/ML SOSY Inject 140 mg into the skin every 14 (fourteen) days. 6 mL 1   Krill Oil 500 MG CAPS Take 500 mg by mouth daily.     letrozole  (FEMARA ) 2.5 MG tablet Take 1 tablet (2.5 mg total) by mouth daily. 90 tablet 0   levocetirizine (XYZAL ) 5 MG tablet Take 5 mg by mouth every evening.   3   LEVOXYL  75 MCG tablet Take 1 tablet by mouth once daily 90 tablet 2   Multiple Vitamin (MULTIVITAMIN WITH MINERALS) TABS tablet Take 1 tablet by mouth daily. Adult 50+      polyethylene glycol powder (GLYCOLAX /MIRALAX ) powder Take 17 g by mouth daily. With coffee     vitamin B-12 (CYANOCOBALAMIN ) 1000 MCG tablet Take 1,000 mcg by mouth daily.     EPINEPHrine  0.3 mg/0.3 mL IJ SOAJ injection Inject 0.3 mg into the muscle as needed for anaphylaxis. (Patient not taking: Reported on 12/23/2021)     No current facility-administered medications on file prior to visit.     Allergies:   Lipitor [atorvastatin ] (palpitations) and Simvastatin   Social History:  The patient  reports that she has never smoked. She has never used smokeless tobacco. She reports current alcohol use. She reports that she does not use drugs.   Family History:   family history includes Arthritis in her father and mother; Cancer in her maternal aunt; Diabetes in her paternal aunt and another family member; Glaucoma in her brother; Heart attack in her father; Heart disease in her father and  mother; Hyperlipidemia in her maternal aunt and another family member; Hypertension in her father and mother; Stroke in her maternal grandmother.    Review of Systems: Review of Systems  Constitutional: Negative.   Respiratory: Negative.    Cardiovascular:  Positive for palpitations.  Gastrointestinal: Negative.   Musculoskeletal:  Positive for joint pain.  Neurological: Negative.   Psychiatric/Behavioral: Negative.    All other systems reviewed and are negative.   PHYSICAL EXAM: VS:  BP 140/62 (BP Location: Left Arm, Patient Position: Sitting, Cuff Size: Normal)   Pulse 65   Ht 5' (1.524 m)   Wt 154 lb 8 oz (70.1 kg)   SpO2 98%   BMI 30.17 kg/m  , BMI Body mass index is 30.17 kg/m. Constitutional:  oriented to person, place, and time. No distress.  HENT:  Head: Grossly normal Eyes:  no discharge. No scleral icterus.  Neck: No JVD, no carotid bruits  Cardiovascular: Regular rate and rhythm, no murmurs appreciated Pulmonary/Chest: Clear to auscultation bilaterally, no wheezes or  rails Abdominal: Soft.  no distension.  no tenderness.  Musculoskeletal: Normal range of motion Neurological:  normal muscle tone. Coordination normal. No atrophy Skin: Skin warm and dry Psychiatric: normal affect, pleasant   Recent Labs: 05/04/2021: TSH 2.15 10/24/2021: B Natriuretic Peptide 76.6; Magnesium 2.0 10/26/2021: Hemoglobin 14.9; Platelets 143 11/18/2021: BUN 28; Creatinine, Ser 0.97; Potassium 4.3; Sodium 140 12/09/2021: ALT 19    Lipid Panel Lab Results  Component Value Date   CHOL 114 12/09/2021   HDL 47.20 12/09/2021   LDLCALC 37 12/09/2021   TRIG 148.0 12/09/2021      Wt Readings from Last 3 Encounters:  12/23/21 154 lb 8 oz (70.1 kg)  11/18/21 155 lb (70.3 kg)  11/17/21 156 lb (70.8 kg)      ASSESSMENT AND PLAN:  Coronary artery disease with stable angina Currently with no symptoms of angina. No further workup at this time. Continue current medication regimen. Cholesterol at goal, A1c at goal, non-smoker  Ischemic cardiomyopathy Prior ejection fraction 35% in March 2023 On carvedilol  We will add Entresto  24/26 mg twice a day. Recommendation made to monitor blood pressure at home and call us  with some numbers.  Follow-up BMP in 2 weeks time  Essential hypertension - Plan: EKG 12-Lead Add Entresto  as above  Palpitations - Plan: EKG 12-Lead No significant symptoms, on low-dose beta-blocker, Coreg   Hyperlipidemia Statin intolerance Doing well on Repatha , cholesterol at goal  Obesity We have encouraged continued exercise, careful diet management in an effort to lose weight.  Aortic atherosclerosis Seen on CT scan, images  Cholesterol at goal   Total encounter time more than 30 minutes  Greater than 50% was spent in counseling and coordination of care with the patient    No orders of the defined types were placed in this encounter.    Signed, Velinda Lunger, M.D., Ph.D. 12/23/2021  [facility] [place],  [place] [phone]

## 2021-12-21 NOTE — Progress Notes (Signed)
Daily Session Note ? ?Patient Details  ?Name: Gwendolyn King ?MRN: [id number] ?Date of Birth: May 21, 1941 ?Referring Provider:   ?Flowsheet Row Cardiac Rehab from 11/17/2021 in [facility] Cardiac and Pulmonary Rehab  ?Referring Provider End, Harrell Gave MD  ? ?  ? ? ?Encounter Date: 12/21/2021 ? ?Check In: ? Session Check In - 12/21/21 1403   ? ?  ? Check-In  ? Supervising physician immediately available to respond to emergencies See telemetry face sheet for immediately available ER MD   ? Location [facility]-Cardiac & Pulmonary Rehab   ? Staff Present Birdie Sons, MPA, RN;Joseph Grayson, RCP,RRT,BSRT;Melissa [place], RDN, LDN   ? Virtual Visit No   ? Medication changes reported     No   ? Fall or balance concerns reported    No   ? Tobacco Cessation No Change   ? Warm-up and Cool-down Performed on first and last piece of equipment   ? Resistance Training Performed Yes   ? VAD Patient? No   ? PAD/SET Patient? No   ?  ? Pain Assessment  ? Currently in Pain? No/denies   ? ?  ?  ? ?  ? ? ? ? ? ?Social History  ? ?Tobacco Use  ?Smoking Status Never  ?Smokeless Tobacco Never  ? ? ?Goals Met:  ?Independence with exercise equipment ?Exercise tolerated well ?No report of concerns or symptoms today ?Strength training completed today ? ?Goals Unmet:  ?Not Applicable ? ?Comments: Pt able to follow exercise prescription today without complaint.  Will continue to monitor for progression. ? ? ? ?Dr. Emily Filbert is Medical Director for [place].  ?Dr. Ottie Glazier is Medical Director for [facility] Pulmonary Rehabilitation. ?

## 2021-12-22 ENCOUNTER — Encounter: Payer: PPO | Admitting: *Deleted

## 2021-12-22 DIAGNOSIS — I214 Non-ST elevation (NSTEMI) myocardial infarction: Secondary | ICD-10-CM

## 2021-12-22 DIAGNOSIS — J3081 Allergic rhinitis due to animal (cat) (dog) hair and dander: Secondary | ICD-10-CM | POA: Diagnosis not present

## 2021-12-22 DIAGNOSIS — J3089 Other allergic rhinitis: Secondary | ICD-10-CM | POA: Diagnosis not present

## 2021-12-22 DIAGNOSIS — J301 Allergic rhinitis due to pollen: Secondary | ICD-10-CM | POA: Diagnosis not present

## 2021-12-22 DIAGNOSIS — I252 Old myocardial infarction: Secondary | ICD-10-CM | POA: Diagnosis not present

## 2021-12-22 DIAGNOSIS — Z955 Presence of coronary angioplasty implant and graft: Secondary | ICD-10-CM

## 2021-12-22 NOTE — Progress Notes (Signed)
Daily Session Note ? ?Patient Details  ?Name: Gwendolyn King ?MRN: [id number] ?Date of Birth: Oct 27, 1940 ?Referring Provider:   ?Flowsheet Row Cardiac Rehab from 11/17/2021 in [facility] Cardiac and Pulmonary Rehab  ?Referring Provider End, Harrell Gave MD  ? ?  ? ? ?Encounter Date: 12/22/2021 ? ?Check In: ? Session Check In - 12/22/21 1454   ? ?  ? Check-In  ? Supervising physician immediately available to respond to emergencies See telemetry face sheet for immediately available ER MD   ? Location [facility]-Card[facility] & Pulmonary Rehab   ? Staff Present Heath Lark, RN, BSN, CCRP;Melissa Eva, RDN, LDN;Joseph [place], [place], [place], [place], [place], CCET   ? Virtual Visit No   ? Medication changes reported     No   ? Fall or balance concerns reported    No   ? Warm-up and Cool-down Performed on first and last piece of equipment   ? Resistance Training Performed Yes   ? VAD Patient? No   ? PAD/SET Patient? No   ?  ? Pain Assessment  ? Currently in Pain? No/denies   ? ?  ?  ? ?  ? ? ? ? ? ?Social History  ? ?Tobacco Use  ?Smoking Status Never  ?Smokeless Tobacco Never  ? ? ?Goals Met:  ?Independence with exercise equipment ?Exercise tolerated well ?No report of concerns or symptoms today ? ?Goals Unmet:  ?Not Applicable ? ?Comments: Pt able to follow exercise prescription today without complaint.  Will continue to monitor for progression. ? ? ? ?Dr. Emily Filbert is Medical Director for [place].  ?Dr. Ottie Glazier is Medical Director for [facility] Pulmonary Rehabilitation. ?

## 2021-12-23 ENCOUNTER — Ambulatory Visit: Payer: PPO | Admitting: Cardiovascular Disease

## 2021-12-23 ENCOUNTER — Encounter: Payer: Self-pay | Admitting: Nurse Practitioner

## 2021-12-23 ENCOUNTER — Encounter: Payer: Self-pay | Admitting: Cardiovascular Disease

## 2021-12-23 ENCOUNTER — Telehealth: Payer: Self-pay | Admitting: Cardiovascular Disease

## 2021-12-23 VITALS — BP 140/62 | HR 65 | Ht 60.0 in | Wt 154.5 lb

## 2021-12-23 DIAGNOSIS — M791 Myalgia, unspecified site: Secondary | ICD-10-CM

## 2021-12-23 DIAGNOSIS — E782 Mixed hyperlipidemia: Secondary | ICD-10-CM | POA: Diagnosis not present

## 2021-12-23 DIAGNOSIS — I2510 Atherosclerotic heart disease of native coronary artery without angina pectoris: Secondary | ICD-10-CM

## 2021-12-23 DIAGNOSIS — I255 Ischemic cardiomyopathy: Secondary | ICD-10-CM | POA: Diagnosis not present

## 2021-12-23 DIAGNOSIS — I214 Non-ST elevation (NSTEMI) myocardial infarction: Secondary | ICD-10-CM | POA: Diagnosis not present

## 2021-12-23 DIAGNOSIS — I10 Essential (primary) hypertension: Secondary | ICD-10-CM | POA: Diagnosis not present

## 2021-12-23 DIAGNOSIS — N183 Chronic kidney disease, stage 3 unspecified: Secondary | ICD-10-CM

## 2021-12-23 DIAGNOSIS — T466X5A Adverse effect of antihyperlipidemic and antiarteriosclerotic drugs, initial encounter: Secondary | ICD-10-CM

## 2021-12-23 DIAGNOSIS — E785 Hyperlipidemia, unspecified: Secondary | ICD-10-CM

## 2021-12-23 MED ORDER — ENTRESTO 24-26 MG PO TABS: 1.0000 | ORAL_TABLET | Freq: Two times a day (BID) | ORAL | 3 refills | Status: DC

## 2021-12-23 NOTE — Telephone Encounter (Signed)
Attempted to schedule no ans no vm ? ? ?Needs 3 m fu Gollan or APP per checkout 12-23-21 [facility] ?

## 2021-12-23 NOTE — Patient Instructions (Addendum)
Medication Instructions:  ?Please start entresto 24/26 mg twice a day ? ?Monitor blood pressure at home, call the office with numbers ?We might increase the mg of the medications ? ?If you need a refill on your cardiac medications before your next appointment, please call your pharmacy.  ? ?Lab work: ?CMP in 2-3 weeks after starting entresto ? ?[facility] Entr[facility] at [facility] ?1st desk on the right to check in, past the screening table ?Lab hours: Monday- Friday (7:30 am- 5:30 pm)  ? ?Testing/Procedures: ?No new testing needed ? ?Follow-Up: ?At [facility], you and your health needs are our priority.  As part of our continuing mission to provide you with exceptional heart care, we have created designated Provider Care Teams.  These Care Teams include your primary Cardiologist (physician) and Advanced Practice Providers (APPs -  Physician Assistants and Nurse Practitioners) who all work together to provide you with the care you need, when you need it. ? ?You will need a follow up appointment in 3 months, app or gollan ? ?Providers on your designated Care Team:   ?Murray Hodgkins, NP ?Christell Faith, PA-C ?Cadence Kathlen Mody, PA-C ? ?COVID-19 Vaccine Information can be found at: ShippingScam.co.uk For questions related to vaccine distribution or appointments, please email [email or web address] or call [phone].  ? ?

## 2021-12-26 ENCOUNTER — Other Ambulatory Visit: Payer: Self-pay | Admitting: Emergency Medicine

## 2021-12-26 DIAGNOSIS — M810 Age-related osteoporosis without current pathological fracture: Secondary | ICD-10-CM

## 2021-12-26 DIAGNOSIS — I214 Non-ST elevation (NSTEMI) myocardial infarction: Secondary | ICD-10-CM

## 2021-12-26 DIAGNOSIS — I252 Old myocardial infarction: Secondary | ICD-10-CM | POA: Diagnosis not present

## 2021-12-26 DIAGNOSIS — Z955 Presence of coronary angioplasty implant and graft: Secondary | ICD-10-CM

## 2021-12-26 NOTE — Progress Notes (Signed)
Daily Session Note ? ?Patient Details  ?Name: Gwendolyn King ?MRN: [id number] ?Date of Birth: 05-Sep-1940 ?Referring Provider:   ?Flowsheet Row Cardiac Rehab from 11/17/2021 in [facility] Cardiac and Pulmonary Rehab  ?Referring Provider End, Harrell Gave MD  ? ?  ? ? ?Encounter Date: 12/26/2021 ? ?Check In: ? Session Check In - 12/26/21 1404   ? ?  ? Check-In  ? Supervising physician immediately available to respond to emergencies See telemetry face sheet for immediately available ER MD   ? Location [facility]-Cardiac & Pulmonary Rehab   ? Staff Present Birdie Sons, MPA, RN;Jessica [place], MA, RCEP, CCRP, Marylynn Pearson, MS, ASCM CEP, Exercise Physiologist;Melissa [place], RDN, LDN   ? Virtual Visit No   ? Medication changes reported     Yes   ? Comments added entresto   ? Fall or balance concerns reported    No   ? Tobacco Cessation No Change   ? Warm-up and Cool-down Performed on first and last piece of equipment   ? Resistance Training Performed Yes   ? VAD Patient? No   ? PAD/SET Patient? No   ?  ? Pain Assessment  ? Currently in Pain? No/denies   ? ?  ?  ? ?  ? ? ? ? ? ?Social History  ? ?Tobacco Use  ?Smoking Status Never  ?Smokeless Tobacco Never  ? ? ?Goals Met:  ?Independence with exercise equipment ?Exercise tolerated well ?No report of concerns or symptoms today ?Strength training completed today ? ?Goals Unmet:  ?Not Applicable ? ?Comments: Pt able to follow exercise prescription today without complaint.  Will continue to monitor for progression. ? ? ? ?Dr. Emily Filbert is Medical Director for [place].  ?Dr. Ottie Glazier is Medical Director for [facility] Pulmonary Rehabilitation. ?

## 2021-12-28 DIAGNOSIS — I214 Non-ST elevation (NSTEMI) myocardial infarction: Secondary | ICD-10-CM

## 2021-12-28 DIAGNOSIS — I252 Old myocardial infarction: Secondary | ICD-10-CM | POA: Diagnosis not present

## 2021-12-28 DIAGNOSIS — J3081 Allergic rhinitis due to animal (cat) (dog) hair and dander: Secondary | ICD-10-CM | POA: Diagnosis not present

## 2021-12-28 DIAGNOSIS — J3089 Other allergic rhinitis: Secondary | ICD-10-CM | POA: Diagnosis not present

## 2021-12-28 DIAGNOSIS — Z955 Presence of coronary angioplasty implant and graft: Secondary | ICD-10-CM

## 2021-12-28 DIAGNOSIS — J301 Allergic rhinitis due to pollen: Secondary | ICD-10-CM | POA: Diagnosis not present

## 2021-12-28 NOTE — Progress Notes (Signed)
Daily Session Note ? ?Patient Details  ?Name: Gwendolyn King ?MRN: [id number] ?Date of Birth: 21-Mar-1941 ?Referring Provider:   ?Flowsheet Row Cardiac Rehab from 11/17/2021 in [facility] Cardiac and Pulmonary Rehab  ?Referring Provider End, Harrell Gave MD  ? ?  ? ? ?Encounter Date: 12/28/2021 ? ?Check In: ? Session Check In - 12/28/21 1405   ? ?  ? Check-In  ? Supervising physician immediately available to respond to emergencies See telemetry face sheet for immediately available ER MD   ? Location [facility]-Cardiac & Pulmonary Rehab   ? Staff Present Birdie Sons, MPA, RN;Melissa [place], RDN, LDN;Joseph [place], RCP,RRT,BSRT   ? Virtual Visit No   ? Medication changes reported     No   ? Fall or balance concerns reported    No   ? Tobacco Cessation No Change   ? Warm-up and Cool-down Performed on first and last piece of equipment   ? Resistance Training Performed Yes   ? VAD Patient? No   ? PAD/SET Patient? No   ?  ? Pain Assessment  ? Currently in Pain? No/denies   ? ?  ?  ? ?  ? ? ? ? ? ?Social History  ? ?Tobacco Use  ?Smoking Status Never  ?Smokeless Tobacco Never  ? ? ?Goals Met:  ?Independence with exercise equipment ?Exercise tolerated well ?No report of concerns or symptoms today ?Strength training completed today ? ?Goals Unmet:  ?Not Applicable ? ?Comments: Pt able to follow exercise prescription today without complaint.  Will continue to monitor for progression. ? ? ? ?Dr. Emily Filbert is Medical Director for [place].  ?Dr. Ottie Glazier is Medical Director for [facility] Pulmonary Rehabilitation. ?

## 2021-12-29 DIAGNOSIS — J3089 Other allergic rhinitis: Secondary | ICD-10-CM | POA: Diagnosis not present

## 2021-12-29 DIAGNOSIS — I252 Old myocardial infarction: Secondary | ICD-10-CM | POA: Diagnosis not present

## 2021-12-29 DIAGNOSIS — J3081 Allergic rhinitis due to animal (cat) (dog) hair and dander: Secondary | ICD-10-CM | POA: Diagnosis not present

## 2021-12-29 DIAGNOSIS — I214 Non-ST elevation (NSTEMI) myocardial infarction: Secondary | ICD-10-CM

## 2021-12-29 DIAGNOSIS — J301 Allergic rhinitis due to pollen: Secondary | ICD-10-CM | POA: Diagnosis not present

## 2021-12-29 DIAGNOSIS — Z955 Presence of coronary angioplasty implant and graft: Secondary | ICD-10-CM

## 2021-12-29 NOTE — Progress Notes (Signed)
Daily Session Note ? ?Patient Details  ?Name: Gwendolyn King ?MRN: [id number] ?Date of Birth: Jul 19, 1941 ?Referring Provider:   ?Flowsheet Row Cardiac Rehab from 11/17/2021 in [facility] Cardiac and Pulmonary Rehab  ?Referring Provider End, Harrell Gave MD  ? ?  ? ? ?Encounter Date: 12/29/2021 ? ?Check In: ? Session Check In - 12/29/21 1409   ? ?  ? Check-In  ? Supervising physician immediately available to respond to emergencies See telemetry face sheet for immediately available ER MD   ? Location [facility]-Cardiac & Pulmonary Rehab   ? Staff Present [place] Sam, MA, RCEP, CCRP, CCET;Darly Fails, RN, BSN;Joseph [place], [place]   ? Virtual Visit No   ? Medication changes reported     No   ? Fall or balance concerns reported    No   ? Tobacco Cessation No Change   ? Warm-up and Cool-down Performed on first and last piece of equipment   ? Resistance Training Performed Yes   ? VAD Patient? No   ? PAD/SET Patient? No   ?  ? Pain Assessment  ? Currently in Pain? No/denies   ? ?  ?  ? ?  ? ? ? ? ? ?Social History  ? ?Tobacco Use  ?Smoking Status Never  ?Smokeless Tobacco Never  ? ? ?Goals Met:  ?Independence with exercise equipment ?Exercise tolerated well ?No report of concerns or symptoms today ?Strength training completed today ? ?Goals Unmet:  ?Not Applicable ? ?Comments: Pt able to follow exercise prescription today without complaint.  Will continue to monitor for progression.. ? ?Dr. Emily Filbert is Medical Director for [place].  ?Dr. Ottie Glazier is Medical Director for [facility] Pulmonary Rehabilitation. ?

## 2022-01-02 DIAGNOSIS — I214 Non-ST elevation (NSTEMI) myocardial infarction: Secondary | ICD-10-CM

## 2022-01-02 DIAGNOSIS — I252 Old myocardial infarction: Secondary | ICD-10-CM | POA: Diagnosis not present

## 2022-01-02 DIAGNOSIS — Z955 Presence of coronary angioplasty implant and graft: Secondary | ICD-10-CM

## 2022-01-02 NOTE — Progress Notes (Signed)
Daily Session Note ? ?Patient Details  ?Name: Gwendolyn King ?MRN: [id number] ?Date of Birth: 05-13-1941 ?Referring Provider:   ?Flowsheet Row Cardiac Rehab from 11/17/2021 in [facility] Cardiac and Pulmonary Rehab  ?Referring Provider End, Harrell Gave MD  ? ?  ? ? ?Encounter Date: 01/02/2022 ? ?Check In: ? Session Check In - 01/02/22 1404   ? ?  ? Check-In  ? Supervising physician immediately available to respond to emergencies See telemetry face sheet for immediately available ER MD   ? Location [facility]-Cardiac & Pulmonary Rehab   ? Staff Present Birdie Sons, MPA, RN;Joseph [place], RCP,RRT,BSRT;Kara [place], MS, ASCM CEP, Exercise Physiologist   ? Virtual Visit No   ? Medication changes reported     No   ? Fall or balance concerns reported    No   ? Tobacco Cessation No Change   ? Warm-up and Cool-down Performed on first and last piece of equipment   ? Resistance Training Performed Yes   ? VAD Patient? No   ? PAD/SET Patient? No   ?  ? Pain Assessment  ? Currently in Pain? No/denies   ? ?  ?  ? ?  ? ? ? ? ? ?Social History  ? ?Tobacco Use  ?Smoking Status Never  ?Smokeless Tobacco Never  ? ? ?Goals Met:  ?Independence with exercise equipment ?Exercise tolerated well ?No report of concerns or symptoms today ?Strength training completed today ? ?Goals Unmet:  ?Not Applicable ? ?Comments: Pt able to follow exercise prescription today without complaint.  Will continue to monitor for progression. ? ? ? ?Dr. Emily Filbert is Medical Director for [place].  ?Dr. Ottie Glazier is Medical Director for [facility] Pulmonary Rehabilitation. ?

## 2022-01-03 DIAGNOSIS — J3081 Allergic rhinitis due to animal (cat) (dog) hair and dander: Secondary | ICD-10-CM | POA: Diagnosis not present

## 2022-01-03 DIAGNOSIS — J301 Allergic rhinitis due to pollen: Secondary | ICD-10-CM | POA: Diagnosis not present

## 2022-01-03 DIAGNOSIS — J3089 Other allergic rhinitis: Secondary | ICD-10-CM | POA: Diagnosis not present

## 2022-01-04 ENCOUNTER — Encounter: Payer: Self-pay | Admitting: *Deleted

## 2022-01-04 ENCOUNTER — Encounter: Payer: Self-pay | Admitting: Cardiovascular Disease

## 2022-01-04 DIAGNOSIS — I252 Old myocardial infarction: Secondary | ICD-10-CM | POA: Diagnosis not present

## 2022-01-04 DIAGNOSIS — I214 Non-ST elevation (NSTEMI) myocardial infarction: Secondary | ICD-10-CM

## 2022-01-04 NOTE — Progress Notes (Signed)
Daily Session Note ? ?Patient Details  ?Name: POET HINEMAN ?MRN: [id number] ?Date of Birth: Jun 07, 1941 ?Referring Provider:   ?Flowsheet Row Cardiac Rehab from 11/17/2021 in [facility] Cardiac and Pulmonary Rehab  ?Referring Provider End, Harrell Gave MD  ? ?  ? ? ?Encounter Date: 01/04/2022 ? ?Check In: ? Session Check In - 01/04/22 1401   ? ?  ? Check-In  ? Supervising physician immediately available to respond to emergencies See telemetry face sheet for immediately available ER MD   ? Location [facility]-Cardiac & Pulmonary Rehab   ? Staff Present Birdie Sons, MPA, RN;Melissa [place], RDN, LDN;Joseph [place], RCP,RRT,BSRT   ? Virtual Visit No   ? Medication changes reported     No   ? Fall or balance concerns reported    No   ? Tobacco Cessation No Change   ? Warm-up and Cool-down Performed on first and last piece of equipment   ? Resistance Training Performed Yes   ? VAD Patient? No   ? PAD/SET Patient? No   ?  ? Pain Assessment  ? Currently in Pain? No/denies   ? ?  ?  ? ?  ? ? ? ? ? ?Social History  ? ?Tobacco Use  ?Smoking Status Never  ?Smokeless Tobacco Never  ? ? ?Goals Met:  ?Independence with exercise equipment ?Exercise tolerated well ?No report of concerns or symptoms today ?Strength training completed today ? ?Goals Unmet:  ?Not Applicable ? ?Comments: Pt able to follow exercise prescription today without complaint.  Will continue to monitor for progression. ? ? ? ?Dr. Emily Filbert is Medical Director for [place].  ?Dr. Ottie Glazier is Medical Director for [facility] Pulmonary Rehabilitation. ?

## 2022-01-04 NOTE — Progress Notes (Signed)
Cardiac Individual Treatment Plan ? ?Patient Details  ?Name: Gwendolyn King ?MRN: [id number] ?Date of Birth: 07-13-1941 ?Referring Provider:   ?Flowsheet Row Cardiac Rehab from 11/17/2021 in [facility] Cardiac and Pulmonary Rehab  ?Referring Provider End, Harrell Gave MD  ? ?  ? ? ?Initial Encounter Date:  ?Flowsheet Row Cardiac Rehab from 11/17/2021 in [facility] Cardiac and Pulmonary Rehab  ?Date 11/17/21  ? ?  ? ? ?Visit Diagnosis: NSTEMI (non-ST elevated myocardial infarction) ([place]) ? ?Patient's Home Medications on Admission: ? ?Current Outpatient Medications:  ?  acetaminophen (TYLENOL) 500 MG tablet, Take 1,000 mg by mouth at bedtime., Disp: , Rfl:  ?  aspirin EC 81 MG tablet, Take 81 mg by mouth at bedtime. , Disp: , Rfl:  ?  AZELASTINE HCL OP, Place 1 drop into both eyes 3 (three) times daily as needed (dry/irritated eyes.)., Disp: , Rfl:  ?  Biotin 5 MG TABS, Take 5 mg by mouth daily., Disp: , Rfl:  ?  calcium-vitamin D (OSCAL WITH D) 500-200 MG-UNIT tablet, Take 1 tablet by mouth daily. , Disp: , Rfl:  ?  carvedilol (COREG) 6.25 MG tablet, Take 1 tablet (6.25 mg total) by mouth 2 (two) times daily., Disp: 180 tablet, Rfl: 0 ?  cholecalciferol (VITAMIN D) 25 MCG (1000 UNIT) tablet, Take 1,000 Units by mouth every evening., Disp: , Rfl:  ?  clopidogrel (PLAVIX) 75 MG tablet, Take 75 mg by mouth daily., Disp: , Rfl:  ?  EPINEPHrine 0.3 mg/0.3 mL IJ SOAJ injection, Inject 0.3 mg into the muscle as needed for anaphylaxis. (Patient not taking: Reported on 12/23/2021), Disp: , Rfl:  ?  Evolocumab (REPATHA) 140 MG/ML SOSY, Inject 140 mg into the skin every 14 (fourteen) days., Disp: 6 mL, Rfl: 1 ?  Krill Oil 500 MG CAPS, Take 500 mg by mouth daily., Disp: , Rfl:  ?  letrozole (FEMARA) 2.5 MG tablet, Take 1 tablet (2.5 mg total) by mouth daily., Disp: 90 tablet, Rfl: 0 ?  levocetirizine (XYZAL) 5 MG tablet, Take 5 mg by mouth every evening. , Disp: , Rfl: 3 ?  LEVOXYL 75 MCG tablet, Take 1 tablet by mouth once daily, Disp: 90 tablet,  Rfl: 2 ?  Multiple Vitamin (MULTIVITAMIN WITH MINERALS) TABS tablet, Take 1 tablet by mouth daily. Adult 50+, Disp: , Rfl:  ?  polyethylene glycol powder (GLYCOLAX/MIRALAX) powder, Take 17 g by mouth daily. With coffee, Disp: , Rfl:  ?  sacubitril-valsartan (ENTRESTO) 24-26 MG, Take 1 tablet by mouth 2 (two) times daily., Disp: 60 tablet, Rfl: 3 ?  vitamin B-12 (CYANOCOBALAMIN) 1000 MCG tablet, Take 1,000 mcg by mouth daily., Disp: , Rfl:  ? ?Past Medical History: ?Past Medical History:  ?Diagnosis Date  ? Allergy   ? hay fever, fall allergies  ? Arthritis   ? Asthma   ? as a child, dad was smoker  ? 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  ? CAD (coronary artery disease)   ? a. 10/2021 NSTEMI/PCI: LM 57md, LAD 55ost, 99/ (2.5x15 Onyx Frontier DES), D2 90, LCX 80p/m (2.5x30 Onyx Frontier DES), RCA 50ost, 60p, 518mnl iFR throughout). LVEDP 20-2555m.  ? Cataract   ? Bil  ? Chicken pox   ? Chronic HFrEF (heart failure with reduced ejection fraction) [place] ? a. 10/2021 Echo: EF 35-40%, mod asymm LVH w/ sev mid-apical ant, apical HK. GrI DD. Nl RV fxn.  ? Chronic kidney disease   ?  stage 3  ? Colon polyp   ? Constipation   ? Hyperlipidemia   ? Hypothyroidism   ? Ischemic cardiomyopathy   ? a. 10/2021 Echo: EF 35-40%.  ? Palpitations   ? over 20 yeras ago  ? Personal history of radiation therapy   ? Thyroid disease   ? ? ?Tobacco Use: ?Social History  ? ?Tobacco Use  ?Smoking Status Never  ?Smokeless Tobacco Never  ? ? ?Labs: ?Review Flowsheet   ? ?  ?  Latest Ref Rng & Units 05/04/2021 06/03/2021 08/08/2021 10/25/2021  ?Labs for ITP Cardiac and Pulmonary Rehab  ?Cholestrol 0 - 200 mg/dL 203     177    ?LDL (calc) 0 - 99 mg/dL 119     108    ?Direct LDL mg/dL  132.0   127.0     ?HDL-C >39.00 mg/dL 44.50     40    ?Trlycerides 0.0 - 149.0 mg/dL 199.0     143    ?Hemoglobin A1c 4.6 - 6.5 %    5.2    ? ?  12/09/2021  ?Labs for ITP Cardiac  and Pulmonary Rehab  ?Cholestrol 114    ?LDL (calc) 37    ?Direct LDL   ?HDL-C 47.20    ?Trlycerides 148.0    ?Hemoglobin A1c 5.1    ?  ?  ?  ? ? ? ?Exercise Target Goals: ?Exercise Program Goal: ?Individual exercise prescription set using results from initial 6 min walk test and THRR while considering  patient?s activity barriers and safety.  ? ?Exercise Prescription Goal: ?Initial exercise prescription builds to 30-45 minutes a day of aerobic activity, 2-3 days per week.  Home exercise guidelines will be given to patient during program as part of exercise prescription that the participant will acknowledge. ? ? ?Education: Aerobic Exercise: ?- Group verbal and visual presentation on the components of exercise prescription. Introduces F.I.T.T principle from ACSM for exercise prescriptions.  Reviews F.I.T.T. principles of aerobic exercise including progression. Written material given at graduation. ? ? ?Education: Resistance Exercise: ?- Group verbal and visual presentation on the components of exercise prescription. Introduces F.I.T.T principle from ACSM for exercise prescriptions  Reviews F.I.T.T. principles of resistance exercise including progression. Written material given at graduation. ?Flowsheet Row Cardiac Rehab from 12/28/2021 in [facility] Cardiac and Pulmonary Rehab  ?Date 12/28/21  ?Educator [facility]  ?Instruction Review Code 1- Verbalizes Understanding  ? ?  ? ?  ?Education: Exercise & Equipment Safety: ?- Individual verbal instruction and demonstration of equipment use and safety with use of the equipment. ?Flowsheet Row Cardiac Rehab from 12/28/2021 in [facility] Cardiac and Pulmonary Rehab  ?Date 11/10/21  ?Educator [facility]  ?Instruction Review Code 1- Verbalizes Understanding  ? ?  ? ? ?Education: Exercise Physiology & General Exercise Guidelines: ?- Group verbal and written instruction with models to review the exercise physiology of the cardiovascular system and associated critical values. Provides general exercise  guidelines with specific guidelines to those with heart or lung disease.  ?Flowsheet Row Cardiac Rehab from 12/28/2021 in [facility] Cardiac and Pulmonary Rehab  ?Date 12/14/21  ?Educator [facility]  ?Instruction Review Code 1- Verbalizes Understanding  ? ?  ? ? ?Education: Flexibility, Balance, Mind/Body Relaxation: ?- Group verbal and visual presentation with interactive activity on the components of exercise prescription. Introduces F.I.T.T principle from ACSM for exercise prescriptions. Reviews F.I.T.T. principles of flexibility and balance exercise training including progression. Also discusses the mind body connection.  Reviews various relaxation techniques to help reduce and manage  stress (i.e. Deep breathing, progressive muscle relaxation, and visualization). Balance handout provided to take home. Written material given at graduation. ? ? ?Activity Barriers & Risk Stratification: ? Activity Barriers & Cardiac Risk Stratification - 11/17/21 1714   ? ?  ? Activity Barriers & Cardiac Risk Stratification  ? Activity Barriers Arthritis;Deconditioning;Muscular Weakness;Other (comment);Right Knee Replacement   ? Comments Left hip pain   ? Cardiac Risk Stratification Moderate   ? ?  ?  ? ?  ? ? ?6 Minute Walk: ? 6 Minute Walk   ? ? [place] Name 11/17/21 1706  ?  ?  ?  ? 6 Minute Walk  ? Phase Initial    ? Distance 1140 feet    ? Walk Time 6 minutes    ? # of Rest Breaks 0    ? MPH 2.15    ? METS 1.96    ? RPE 13    ? Perceived Dyspnea  1    ? VO2 Peak 6.88    ? Symptoms Yes (comment)    ? Comments Left hip pain 1/10    ? Resting HR 62 bpm    ? Resting BP 128/72    ? Resting Oxygen Saturation  100 %    ? Exercise Oxygen Saturation  during 6 min walk 98 %    ? Max Ex. HR 86 bpm    ? Max Ex. BP 160/66    ? 2 Minute Post BP 130/68    ? ?  ?  ? ?  ? ? ?Oxygen Initial Assessment: ? ? ?Oxygen Re-Evaluation: ? ? ?Oxygen Discharge (Final Oxygen Re-Evaluation): ? ? ?Initial Exercise Prescription: ? Initial Exercise Prescription - 11/17/21 1700    ? ?  ? Date of Initial Exercise RX and Referring Provider  ? Date 11/17/21   ? Referring Provider End, Harrell Gave MD   ?  ? Oxygen  ? Maintain Oxygen Saturation 88% or higher   ?  ? Treadmill  ? MPH 2

## 2022-01-05 ENCOUNTER — Encounter: Payer: Self-pay | Admitting: Nurse Practitioner

## 2022-01-05 ENCOUNTER — Inpatient Hospital Stay: Payer: PPO | Attending: Nurse Practitioner

## 2022-01-05 ENCOUNTER — Inpatient Hospital Stay: Payer: PPO

## 2022-01-05 ENCOUNTER — Inpatient Hospital Stay (HOSPITAL_BASED_OUTPATIENT_CLINIC_OR_DEPARTMENT_OTHER): Payer: PPO | Admitting: Nurse Practitioner

## 2022-01-05 ENCOUNTER — Other Ambulatory Visit: Payer: Self-pay

## 2022-01-05 VITALS — BP 115/49 | HR 57 | Temp 98.2°F | Resp 16 | Wt 154.5 lb

## 2022-01-05 DIAGNOSIS — D0511 Intraductal carcinoma in situ of right breast: Secondary | ICD-10-CM | POA: Insufficient documentation

## 2022-01-05 DIAGNOSIS — M810 Age-related osteoporosis without current pathological fracture: Secondary | ICD-10-CM

## 2022-01-05 LAB — CBC WITH DIFFERENTIAL/PLATELET
Abs Immature Granulocytes: 0.01 10*3/uL (ref 0.00–0.07)
Basophils Absolute: 0 10*3/uL (ref 0.0–0.1)
Basophils Relative: 1 %
Eosinophils Absolute: 0.4 10*3/uL (ref 0.0–0.5)
Eosinophils Relative: 8 %
HCT: 41.1 % (ref 36.0–46.0)
Hemoglobin: 13.9 g/dL (ref 12.0–15.0)
Immature Granulocytes: 0 %
Lymphocytes Relative: 34 %
Lymphs Abs: 1.6 10*3/uL (ref 0.7–4.0)
MCH: 29.4 pg (ref 26.0–34.0)
MCHC: 33.8 g/dL (ref 30.0–36.0)
MCV: 87.1 fL (ref 80.0–100.0)
Monocytes Absolute: 0.6 10*3/uL (ref 0.1–1.0)
Monocytes Relative: 12 %
Neutro Abs: 2.2 10*3/uL (ref 1.7–7.7)
Neutrophils Relative %: 45 %
Platelets: 90 10*3/uL — ABNORMAL LOW (ref 150–400)
RBC: 4.72 MIL/uL (ref 3.87–5.11)
RDW: 12.8 % (ref 11.5–15.5)
WBC: 4.8 10*3/uL (ref 4.0–10.5)
nRBC: 0 % (ref 0.0–0.2)

## 2022-01-05 LAB — COMPREHENSIVE METABOLIC PANEL WITH GFR
ALT: 18 U/L (ref 0–44)
AST: 20 U/L (ref 15–41)
Albumin: 3.8 g/dL (ref 3.5–5.0)
Alkaline Phosphatase: 34 U/L — ABNORMAL LOW (ref 38–126)
Anion gap: 6 (ref 5–15)
BUN: 25 mg/dL — ABNORMAL HIGH (ref 8–23)
CO2: 26 mmol/L (ref 22–32)
Calcium: 9.5 mg/dL (ref 8.9–10.3)
Chloride: 107 mmol/L (ref 98–111)
Creatinine, Ser: 1.16 mg/dL — ABNORMAL HIGH (ref 0.44–1.00)
GFR, Estimated: 48 mL/min — ABNORMAL LOW
Glucose, Bld: 89 mg/dL (ref 70–99)
Potassium: 3.8 mmol/L (ref 3.5–5.1)
Sodium: 139 mmol/L (ref 135–145)
Total Bilirubin: 0.8 mg/dL (ref 0.3–1.2)
Total Protein: 6.2 g/dL — ABNORMAL LOW (ref 6.5–8.1)

## 2022-01-05 MED ORDER — DENOSUMAB 60 MG/ML ~~LOC~~ SOSY
60.0000 mg | PREFILLED_SYRINGE | Freq: Once | SUBCUTANEOUS | Status: AC
  Administered 2022-01-05: 60 mg via SUBCUTANEOUS
  Filled 2022-01-05: qty 1

## 2022-01-05 MED ORDER — SODIUM CHLORIDE 0.9 % IV SOLN
Freq: Once | INTRAVENOUS | Status: DC
  Filled 2022-01-05: qty 250

## 2022-01-05 NOTE — Progress Notes (Signed)
[place]  Telephone:(336) [phone] Fax:(336) [phone]  ID: Peggye Fothergill OB: 1941/08/20  MR#: [id number]  BZJ#:[id number]  Patient Care Team: Leone Haven, MD as PCP - General (Family Medicine) Rockey Situ Kathlene November, MD as PCP - Cardiology (Cardiology) Minna Merritts, MD as Consulting Physician (Cardiology) Bary Castilla, Forest Gleason, MD (General Surgery)   CHIEF COMPLAINT: DCIS, right breast.  INTERVAL HISTORY: Patient returns to clinic today for routine 8-month evaluation and continuation of Prolia.  She continues to feel well and remains asymptomatic.  She is tolerating letrozole without significant side effects.  She has no neurologic complaints.  She denies any recent fevers or illnesses.  She has a good appetite and denies weight loss. She denies any chest pain, shortness of breath, cough, or hemoptysis.  She denies any nausea, vomiting, constipation, or diarrhea.  She has no urinary complaints.  Patient offers no specific complaints today.  REVIEW OF SYSTEMS:   Review of Systems  Constitutional: Negative.  Negative for diaphoresis, fever, malaise/fatigue and weight loss.  Respiratory: Negative.  Negative for cough and shortness of breath.   Cardiovascular: Negative.  Negative for chest pain and leg swelling.  Gastrointestinal: Negative.  Negative for abdominal pain and constipation.  Genitourinary: Negative.  Negative for dysuria.  Musculoskeletal: Negative.  Negative for back pain.  Skin: Negative.  Negative for itching and rash.  Neurological: Negative.  Negative for sensory change, focal weakness and weakness.  Psychiatric/Behavioral: Negative.  The patient is not nervous/anxious.    As per HPI. Otherwise, a complete review of systems is negative.  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   CAD (coronary artery disease)    a. 10/2021 NSTEMI/PCI: LM 7m/d, LAD 55ost, 99/ (2.5x15 Onyx Frontier DES), D2 90, LCX 80p/m (2.5x30 Onyx Frontier DES), RCA 50ost, 60p, 71m (nl iFR throughout). LVEDP 20-101mmHg.   Cataract    Bil   Chicken pox    Chronic HFrEF (heart failure with reduced ejection fraction) ([place])    a. 10/2021 Echo: EF 35-40%, mod asymm LVH w/ sev mid-apical ant, apical HK. GrI DD. Nl RV fxn.   Chronic kidney disease    stage 3   Colon polyp    Constipation    Hyperlipidemia    Hypothyroidism    Ischemic cardiomyopathy    a. 10/2021 Echo: EF 35-40%.   Palpitations    over 20 yeras ago   Personal history of radiation therapy    Thyroid disease     PAST SURGICAL HISTORY: Past Surgical History:  Procedure Laterality Date   ABDOMINAL HYSTERECTOMY  1981   APPENDECTOMY  2012   BREAST BIOPSY Right 05/08/2017   Affirm Bx-DUCTAL CARCINOMA IN SITU (DCIS) WITH ONE FOCUS SUSPICIOUS FOR invasive   BREAST BIOPSY Right 05/17/2017   [place] bx done at DR. Brynetts office, papillary carcinoma   BREAST BIOPSY Right 06/18/2019   Stereo bc calcs COMPATIBLE WITH PRIOR SURGICAL SITE CHANGES   BREAST LUMPECTOMY Right 06/07/2017   DUCTAL CARCINOMA IN SITU (DCIS) with microinvasion at 7:00 5 cmfn   BREAST LUMPECTOMY Right 06/07/2017   PAPILLARY LESION CONSISTENT WITH PAPILLARY CARCINOMA at retroaerolar 7:00 1.5 cmfn   BREAST LUMPECTOMY WITH SENTINEL LYMPH NODE BIOPSY Right 06/07/2017  Wide excision of 2 foci of high-grade DCIS, microinvasion noted only on core biopsy. 2 microscopic foci lateral inferior margins. Patient elected not to proceed to reexcision.   COLONOSCOPY     COLONOSCOPY W/ POLYPECTOMY     CORONARY STENT INTERVENTION N/A 10/25/2021   Procedure: CORONARY STENT INTERVENTION;  Surgeon: Nelva Bush, MD;  Location: [place] CV [facility];  Service: Cardiovascular;  Laterality: N/A;   CYSTOCELE  REPAIR N/A 09/11/2019   Procedure: ANTERIOR COLPORRHAPHY;  Surgeon: Gae Dry, MD;  Location: [facility] OR[facility];  Service: Gynecology;  Laterality: N/A;   FINGER FRACTURE SURGERY Right    5th   HAMMER TOE SURGERY  09/20/2017   INTRAVASCULAR PRESSURE WIRE/FFR STUDY N/A 10/25/2021   Procedure: INTRAVASCULAR PRESSURE WIRE/FFR STUDY;  Surgeon: Nelva Bush, MD;  Location: [place] CV LAB;  Service: Cardiovascular;  Laterality: N/A;   JOINT REPLACEMENT     right knee   LEFT HEART CATH AND CORONARY ANGIOGRAPHY N/A 10/25/2021   Procedure: LEFT HEART CATH AND CORONARY ANGIOGRAPHY;  Surgeon: Nelva Bush, MD;  Location: [place] CV LAB;  Service: Cardiovascular;  Laterality: N/A;   ROTATOR CUFF REPAIR Right    TONSILLECTOMY AND ADENOIDECTOMY  1963   TOTAL KNEE ARTHROPLASTY Right 10/17/2016   Procedure: TOTAL KNEE ARTHROPLASTY;  Surgeon: Melrose Nakayama, MD;  Location: [place];  Service: Orthopedics;  Laterality: Right;   VAGINAL DELIVERY     3    FAMILY HISTORY: Family History  Problem Relation Age of Onset   Arthritis Mother    Heart disease Mother    Hypertension Mother    Arthritis Father    Heart disease Father    Hypertension Father    Heart attack Father    Stroke Maternal Grandmother    Cancer Maternal Aunt        abdominal?   Hyperlipidemia Maternal Aunt    Diabetes Paternal Aunt    Glaucoma Brother    Hyperlipidemia Other    Diabetes Other    Breast cancer Neg Hx     ADVANCED DIRECTIVES (Y/N):  N  HEALTH MAINTENANCE: Social History   Tobacco Use   Smoking status: Never   Smokeless tobacco: Never  Vaping Use   Vaping Use: Never used  Substance Use Topics   Alcohol use: Yes    Comment: wine occ   Drug use: No     Colonoscopy:  PAP:  Bone density:  Lipid panel:  Allergies  Allergen Reactions   Singulair [Montelukast] Itching   Lipitor [Atorvastatin] Other (See Comments)    Myalgia, arthralgia   Simvastatin Other (See Comments)    myalgia     Current Outpatient Medications  Medication Sig Dispense Refill   acetaminophen (TYLENOL) 500 MG tablet Take 1,000 mg by mouth at bedtime.     aspirin EC 81 MG tablet Take 81 mg by mouth at bedtime.      Biotin 5 MG TABS Take 5 mg by mouth daily.     calcium-vitamin D (OSCAL WITH D) 500-200 MG-UNIT tablet Take 1 tablet by mouth daily.      carvedilol (COREG) 6.25 MG tablet Take 1 tablet (6.25 mg total) by mouth 2 (two) times daily. 180 tablet 0   cholecalciferol (VITAMIN D) 25 MCG (1000 UNIT) tablet Take 1,000 Units by mouth every evening.     clopidogrel (PLAVIX) 75 MG tablet Take 75 mg by mouth daily.     Evolocumab (REPATHA) 140 MG/ML SOSY Inject 140 mg into the skin every 14 (fourteen) days. 6  mL 1   Krill Oil 500 MG CAPS Take 500 mg by mouth daily.     letrozole (FEMARA) 2.5 MG tablet Take 1 tablet (2.5 mg total) by mouth daily. 90 tablet 0   levocetirizine (XYZAL) 5 MG tablet Take 5 mg by mouth every evening.   3   LEVOXYL 75 MCG tablet Take 1 tablet by mouth once daily 90 tablet 2   Multiple Vitamin (MULTIVITAMIN WITH MINERALS) TABS tablet Take 1 tablet by mouth daily. Adult 50+     polyethylene glycol powder (GLYCOLAX/MIRALAX) powder Take 17 g by mouth daily. With coffee     sacubitril-valsartan (ENTRESTO) 24-26 MG Take 1 tablet by mouth 2 (two) times daily. 60 tablet 3   vitamin B-12 (CYANOCOBALAMIN) 1000 MCG tablet Take 1,000 mcg by mouth daily.     AZELASTINE HCL OP Place 1 drop into both eyes 3 (three) times daily as needed (dry/irritated eyes.). (Patient not taking: Reported on 01/05/2022)     EPINEPHrine 0.3 mg/0.3 mL IJ SOAJ injection Inject 0.3 mg into the muscle as needed for anaphylaxis. (Patient not taking: Reported on 12/23/2021)     No current facility-administered medications for this visit.    OBJECTIVE: Vitals:   01/05/22 1045  BP: (!) 115/49  Pulse: (!) 57  Resp: 16  Temp: 98.2 F (36.8 C)  SpO2: 98%     Body mass index is 30.17 kg/m.    ECOG FS:0 -  Asymptomatic  General: Well-developed, well-nourished, no acute distress. Eyes: Pink conjunctiva, anicteric sclera. HEENT: Normocephalic, moist mucous membranes. Breast: Exam deferred today. Lungs: No audible wheezing or coughing. Heart: Regular rate and rhythm. Abdomen: Soft, nontender, no obvious distention. Musculoskeletal: No edema, cyanosis, or clubbing. Neuro: Alert, answering all questions appropriately. Cranial nerves grossly intact. Skin: No rashes or petechiae noted. Psych: Normal affect.   LAB RESULTS:  Lab Results  Component Value Date   NA 139 01/05/2022   K 3.8 01/05/2022   CL 107 01/05/2022   CO2 26 01/05/2022   GLUCOSE 89 01/05/2022   BUN 25 (H) 01/05/2022   CREATININE 1.16 (H) 01/05/2022   CALCIUM 9.5 01/05/2022   PROT 6.2 (L) 01/05/2022   ALBUMIN 3.8 01/05/2022   AST 20 01/05/2022   ALT 18 01/05/2022   ALKPHOS 34 (L) 01/05/2022   BILITOT 0.8 01/05/2022   GFRNONAA 48 (L) 01/05/2022   GFRAA 49 (L) 06/01/2017    Lab Results  Component Value Date   WBC 4.8 01/05/2022   NEUTROABS 2.2 01/05/2022   HGB 13.9 01/05/2022   HCT 41.1 01/05/2022   MCV 87.1 01/05/2022   PLT 90 (L) 01/05/2022     STUDIES: No results found.   ASSESSMENT: DCIS, right breast  PLAN:    1. DCIS, right breast: Patient underwent lumpectomy on June 07, 2017.  Final pathology was reviewed and no invasive component was noted, therefore patient did not require chemotherapy.  She completed adjuvant XRT in January 2019.  Because of patient's persistent hot flashes, tamoxifen was discontinued and patient was initiated on letrozole.  Patient expressed understanding that although tamoxifen is the recommended treatment, there is still significant benefit with aromatase inhibitors. Continue treatment for a total of 5 years completing in January 2024. Her most recent mammogram on June 13, 2021 was reported as BI-RADS 2, repeat in October 2023.  Return to clinic in 6 months for routine  evaluation.    2.  Thrombocytopenia: Chronic and unchanged. Her most recent platelet count was 90. Asymptomatic. If platelet count continues  to decline, consider additional workup.   3.  Osteoporosis: Patient's most recent bone mineral density on June 14, 2021 was reported -2.7 which is significantly improved from 1 year prior where her T score was reported -3.1.  Patient did not receive benefit from Fosamax and was switched to Prolia.  Her calcium levels are 9.5 and adequate to proceed with treatment.  Continue calcium and vitamin D supplementation.  Repeat bone mineral density in October 2023.  Return to clinic in 6 months for repeat laboratory work and continuation of treatment.     I spent a total of 30 minutes reviewing chart data, face-to-face evaluation with the patient, counseling and coordination of care as detailed above.  Patient expressed understanding and was in agreement with this plan. She also understands that She can call clinic at any time with any questions, concerns, or complaints.    Cancer Staging  Ductal carcinoma in situ (DCIS) of right breast Staging form: Breast, AJCC 8th Edition - Clinical stage from 08/22/2017: Stage 0 (cTis (DCIS), cN0, cM0, ER+, PR-, HER2-) - Signed by Lloyd Huger, MD on 08/22/2017 Laterality: Right   Verlon Au, NP 01/05/2022

## 2022-01-09 DIAGNOSIS — I252 Old myocardial infarction: Secondary | ICD-10-CM | POA: Diagnosis not present

## 2022-01-09 DIAGNOSIS — I214 Non-ST elevation (NSTEMI) myocardial infarction: Secondary | ICD-10-CM

## 2022-01-09 NOTE — Progress Notes (Signed)
Daily Session Note  Patient Details  Name: Gwendolyn King MRN: [id number] Date of Birth: [birth year] Referring Provider:   Flowsheet Row Cardiac Rehab from 11/17/2021 in [facility] Cardiac and Pulmonary Rehab  Referring Provider End, Harrell Gave MD       Encounter Date: 01/09/2022  Check In:  Session Check In - 01/09/22 1407       Check-In   Supervising physician immediately available to respond to emergencies See telemetry face sheet for immediately available ER MD    Location [facility]-Cardiac & Pulmonary Rehab    Staff Present Birdie Sons, MPA, Nino Glow, MS, ASCM CEP, Exercise Physiologist;Joseph Tessie Fass, [place]    Virtual Visit No    Medication changes reported     No    Fall or balance concerns reported    No    Tobacco Cessation No Change    Warm-up and Cool-down Performed on first and last piece of equipment    Resistance Training Performed Yes    VAD Patient? No    PAD/SET Patient? No      Pain Assessment   Currently in Pain? No/denies                Social History   Tobacco Use  Smoking Status Never  Smokeless Tobacco Never    Goals Met:  Independence with exercise equipment Exercise tolerated well No report of concerns or symptoms today Strength training completed today  Goals Unmet:  Not Applicable  Comments: Pt able to follow exercise prescription today without complaint.  Will continue to monitor for progression.    Dr. Emily Filbert is Medical Director for [place].  Dr. Ottie Glazier is Medical Director for [facility] Pulmonary Rehabilitation.

## 2022-01-11 DIAGNOSIS — I252 Old myocardial infarction: Secondary | ICD-10-CM | POA: Diagnosis not present

## 2022-01-11 DIAGNOSIS — I214 Non-ST elevation (NSTEMI) myocardial infarction: Secondary | ICD-10-CM

## 2022-01-11 NOTE — Progress Notes (Signed)
Daily Session Note  Patient Details  Name: Gwendolyn King MRN: [id number] Date of Birth: Jul 20, 1941 Referring Provider:   Flowsheet Row Cardiac Rehab from 11/17/2021 in [facility] Cardiac and Pulmonary Rehab  Referring Provider End, Harrell Gave MD       Encounter Date: 01/11/2022  Check In:  Session Check In - 01/11/22 1352       Check-In   Supervising physician immediately available to respond to emergencies See telemetry face sheet for immediately available ER MD    Location [facility]-Cardiac & Pulmonary Rehab    Staff Present Birdie Sons, MPA, RN;Melissa Disputanta, RDN, LDN;Joseph [place], Sharren [place], MS, ASCM CEP, Exercise Physiologist    Virtual Visit No    Medication changes reported     No    Fall or balance concerns reported    No    Tobacco Cessation No Change    Warm-up and Cool-down Performed on first and last piece of equipment    Resistance Training Performed Yes    VAD Patient? No    PAD/SET Patient? No      Pain Assessment   Currently in Pain? No/denies                Social History   Tobacco Use  Smoking Status Never  Smokeless Tobacco Never    Goals Met:  Independence with exercise equipment Exercise tolerated well No report of concerns or symptoms today Strength training completed today  Goals Unmet:  Not Applicable  Comments: Pt able to follow exercise prescription today without complaint.  Will continue to monitor for progression.    Dr. Emily Filbert is Medical Director for [place].  Dr. Ottie Glazier is Medical Director for [facility] Pulmonary Rehabilitation.

## 2022-01-12 ENCOUNTER — Encounter: Payer: PPO | Admitting: *Deleted

## 2022-01-12 DIAGNOSIS — J3089 Other allergic rhinitis: Secondary | ICD-10-CM | POA: Diagnosis not present

## 2022-01-12 DIAGNOSIS — J301 Allergic rhinitis due to pollen: Secondary | ICD-10-CM | POA: Diagnosis not present

## 2022-01-12 DIAGNOSIS — I214 Non-ST elevation (NSTEMI) myocardial infarction: Secondary | ICD-10-CM

## 2022-01-12 DIAGNOSIS — Z955 Presence of coronary angioplasty implant and graft: Secondary | ICD-10-CM

## 2022-01-12 DIAGNOSIS — J3081 Allergic rhinitis due to animal (cat) (dog) hair and dander: Secondary | ICD-10-CM | POA: Diagnosis not present

## 2022-01-12 DIAGNOSIS — I252 Old myocardial infarction: Secondary | ICD-10-CM | POA: Diagnosis not present

## 2022-01-12 NOTE — Progress Notes (Signed)
Daily Session Note  Patient Details  Name: Gwendolyn King MRN: [id number] Date of Birth: [birth year] Referring Provider:   Flowsheet Row Cardiac Rehab from 11/17/2021 in [facility] Cardiac and Pulmonary Rehab  Referring Provider End, Harrell Gave MD       Encounter Date: 01/12/2022  Check In:  Session Check In - 01/12/22 1359       Check-In   Supervising physician immediately available to respond to emergencies See telemetry face sheet for immediately available ER MD    Location [facility]-Cardiac & Pulmonary Rehab    Staff Present Nyoka Cowden, RN, BSN, Lauretta Grill, [place], [place], RCEP, CCRP, CCET    Virtual Visit No    Medication changes reported     No    Fall or balance concerns reported    No    Tobacco Cessation No Change    Warm-up and Cool-down Performed on first and last piece of equipment    Resistance Training Performed Yes    VAD Patient? No    PAD/SET Patient? No      Pain Assessment   Currently in Pain? No/denies                Social History   Tobacco Use  Smoking Status Never  Smokeless Tobacco Never    Goals Met:  Independence with exercise equipment Personal goals reviewed No report of concerns or symptoms today  Goals Unmet:  Not Applicable  Comments: Pt able to follow exercise prescription today without complaint.  Will continue to monitor for progression.    Dr. Emily Filbert is Medical Director for [place].  Dr. Ottie Glazier is Medical Director for [facility] Pulmonary Rehabilitation.

## 2022-01-17 ENCOUNTER — Encounter: Payer: Self-pay | Admitting: Cardiovascular Disease

## 2022-01-18 ENCOUNTER — Encounter: Payer: PPO | Admitting: *Deleted

## 2022-01-18 VITALS — Ht 60.25 in | Wt 152.1 lb

## 2022-01-18 DIAGNOSIS — Z955 Presence of coronary angioplasty implant and graft: Secondary | ICD-10-CM

## 2022-01-18 DIAGNOSIS — I252 Old myocardial infarction: Secondary | ICD-10-CM | POA: Diagnosis not present

## 2022-01-18 DIAGNOSIS — I214 Non-ST elevation (NSTEMI) myocardial infarction: Secondary | ICD-10-CM

## 2022-01-18 NOTE — Progress Notes (Signed)
Daily Session Note  Patient Details  Name: LEEAN AMEZCUA MRN: [id number] Date of Birth: 19-May-1941 Referring Provider:   Flowsheet Row Cardiac Rehab from 11/17/2021 in [facility] Cardiac and Pulmonary Rehab  Referring Provider End, Harrell Gave MD       Encounter Date: 01/18/2022  Check In:  Session Check In - 01/18/22 1420       Check-In   Supervising physician immediately available to respond to emergencies See telemetry face sheet for immediately available ER MD    Location [facility]-Cardiac & Pulmonary Rehab    Staff Present Nyoka Cowden, RN, BSN, Tyna Jaksch, MS, ASCM CEP, Exercise Physiologist;Joseph Tessie Fass, [place]    Virtual Visit No    Medication changes reported     No    Fall or balance concerns reported    No    Tobacco Cessation No Change    Warm-up and Cool-down Performed on first and last piece of equipment    Resistance Training Performed Yes    VAD Patient? No    PAD/SET Patient? No      Pain Assessment   Currently in Pain? No/denies                Social History   Tobacco Use  Smoking Status Never  Smokeless Tobacco Never    Goals Met:  Independence with exercise equipment Exercise tolerated well No report of concerns or symptoms today  Goals Unmet:  Not Applicable  Comments: .exgoo   Dr. Emily Filbert is Medical Director for [facility].  Dr. Ottie Glazier is Medical Director for [facility] Pulmonary Rehabilitation.

## 2022-01-19 ENCOUNTER — Encounter: Payer: PPO | Attending: Internal Medicine

## 2022-01-19 DIAGNOSIS — I214 Non-ST elevation (NSTEMI) myocardial infarction: Secondary | ICD-10-CM | POA: Insufficient documentation

## 2022-01-19 DIAGNOSIS — Z955 Presence of coronary angioplasty implant and graft: Secondary | ICD-10-CM | POA: Insufficient documentation

## 2022-01-19 DIAGNOSIS — J3089 Other allergic rhinitis: Secondary | ICD-10-CM | POA: Diagnosis not present

## 2022-01-19 DIAGNOSIS — J3081 Allergic rhinitis due to animal (cat) (dog) hair and dander: Secondary | ICD-10-CM | POA: Diagnosis not present

## 2022-01-19 DIAGNOSIS — J301 Allergic rhinitis due to pollen: Secondary | ICD-10-CM | POA: Diagnosis not present

## 2022-01-19 MED ORDER — ENTRESTO 24-26 MG PO TABS: 1.0000 | ORAL_TABLET | Freq: Two times a day (BID) | ORAL | 3 refills | Status: DC

## 2022-01-19 NOTE — Patient Instructions (Signed)
Discharge Patient Instructions  Patient Details  Name: Gwendolyn King MRN: [id number] Date of Birth: 13-Jan-1941 Referring Provider:  Leone Haven, MD   Number of Visits: 74  Reason for Discharge:  Patient reached a stable level of exercise. Patient independent in their exercise. Patient has met program and personal goals.  Smoking History:  Social History   Tobacco Use  Smoking Status Never  Smokeless Tobacco Never    Diagnosis:  NSTEMI (non-ST elevated myocardial infarction) ([place])  S/P coronary artery stent placement  Initial Exercise Prescription:  Initial Exercise Prescription - 11/17/21 1700       Date of Initial Exercise RX and Referring Provider   Date 11/17/21    Referring Provider End, Harrell Gave MD      Oxygen   Maintain Oxygen Saturation 88% or higher      Treadmill   MPH 2    Grade 0    Minutes 15    METs 2.53      Recumbant Bike   Level 1    RPM 60    Watts 20    Minutes 15    METs 1.9      NuStep   Level 2    SPM 80    Minutes 15    METs 1.9      REL-XR   Level 1    Speed 50    Minutes 15    METs 1.9      T5 Nustep   Level 1    SPM 80    Minutes 15    METs 1.9      Prescription Details   Frequency (times per week) 3    Duration Progress to 30 minutes of continuous aerobic without signs/symptoms of physical distress      Intensity   THRR 40-80% of Max Heartrate 94- 126    Ratings of Perceived Exertion 11-13    Perceived Dyspnea 0-4      Progression   Progression Continue to progress workloads to maintain intensity without signs/symptoms of physical distress.      Resistance Training   Training Prescription Yes    Weight 3 lb    Reps 10-15             Discharge Exercise Prescription (Final Exercise Prescription Changes):  Exercise Prescription Changes - 01/11/22 1500       Home Exercise Plan   Plans to continue exercise at Home (comment)   walking, [employer] staff videos   Frequency Add 2 additional days  to program exercise sessions.    Initial Home Exercises Provided 01/11/22      Oxygen   Maintain Oxygen Saturation 88% or higher             Functional Capacity:  6 Minute Walk     Row Name 11/17/21 1706 01/18/22 1510       6 Minute Walk   Phase Initial Discharge    Distance 1140 feet 1380 feet    Distance % Change -- 21 %    Distance Feet Change -- 240 ft    Walk Time 6 minutes 6 minutes    # of Rest Breaks 0 0    MPH 2.15 2.15    METS 1.96 2.31    RPE 13 12    Perceived Dyspnea  1 1    VO2 Peak 6.88 8.09    Symptoms Yes (comment) No    Comments Left hip pain 1/10 --    Resting HR 62  bpm 60 bpm    Resting BP 128/72 110/60    Resting Oxygen Saturation  100 % 97 %    Exercise Oxygen Saturation  during 6 min walk 98 % 98 %    Max Ex. HR 86 bpm 89 bpm    Max Ex. BP 160/66 138/60    2 Minute Post BP 130/68 --               Nutrition & Weight - Outcomes:  Pre Biometrics - 11/17/21 1703       Pre Biometrics   Height 5' 0.25" (1.53 m)    Weight 156 lb (70.8 kg)    BMI (Calculated) 30.23    Single Leg Stand 10 seconds             Post Biometrics - 01/18/22 1514        Post  Biometrics   Height 5' 0.25" (1.53 m)    Weight 152 lb 1.6 oz (69 kg)    BMI (Calculated) 29.47             Nutrition:  Nutrition Therapy & Goals - 12/06/21 1301       Nutrition Therapy   Diet Heart healthy, low Na    Protein (specify units) 55g    Fiber 25 grams    Whole Grain Foods 3 servings    Saturated Fats 12 max. grams    Fruits and Vegetables 8 servings/day    Sodium 2 grams      Personal Nutrition Goals   Nutrition Goal ST: practice MyPlate LT: limit saturated fat <12g/day, limit Na <2g/day, eat at least 8 fruit/vegetable servings per day    Comments 81 y.o. F admitted to cardiac rehab for NSTEMI. PMHx includes HTN, hypothyroidism, CKD stg 3, HLD, aortic atherosclerosis, breast cancer (s/p radiation therapy), stroke. Relevant medications include biotin,  Ca-vit D, coreg, krill oil, femara, MVI, mitalax, B-12. PYP Score: 55. Vegetables & Fruits 6/12. Breads, Grains & Cereals 6/12. Red & Processed Meat 8/12. Poultry 2/2. Fish & Shellfish 1/4. Beans, Nuts & Seeds 3/4. Milk & Dairy Foods 2/6. Toppings, Oils, Seasonings & Salt 10/20. Sweets, Snacks & Restaurant Food 7/14. Beverages 10/10.  She reports her husband is picky. She does what she can with cooking. B: mid morning: old fashioned oatmeal with 1/4 tsp butter and coffee (no sugar creamer). L: mid afternoon: after class with get sub from [employer] or something similar (removes some of bread, meat and cheese) D: chicken breast, vegetables, potatoes. She will sometimes have a salad by herself as her husband won't eat it. She uses olive oil or spray, she only will add some salt to oatmeal - she enjoys pepper. Drinks: water with mio flavoring or [employer] flavoring or trulemon. She reports trying to eat less bread. Discussed heart healthy diet.      Intervention Plan   Intervention Prescribe, educate and counsel regarding individualized specific dietary modifications aiming towards targeted core components such as weight, hypertension, lipid management, diabetes, heart failure and other comorbidities.    Expected Outcomes Short Term Goal: Understand basic principles of dietary content, such as calories, fat, sodium, cholesterol and nutrients.;Short Term Goal: A plan has been developed with personal nutrition goals set during dietitian appointment.;Long Term Goal: Adherence to prescribed nutrition plan.              Goals reviewed with patient; copy given to patient.

## 2022-01-19 NOTE — Progress Notes (Signed)
Daily Session Note  Patient Details  Name: Gwendolyn King MRN: [id number] Date of Birth: 04/12/1941 Referring Provider:   Flowsheet Row Cardiac Rehab from 11/17/2021 in [facility] Cardiac and Pulmonary Rehab  Referring Provider End, Christopher MD       Encounter Date: 01/19/2022  Check In:  Session Check In - 01/19/22 1424       Check-In   Supervising physician immediately available to respond to emergencies See telemetry face sheet for immediately available ER MD    Location [facility]    Staff Present Leslie Castrejon, RN, BSN;Joseph Hood, RCP,RRT,BSRT;Jessica Hawkins, MA, RCEP, CCRP, CCET    Virtual Visit No    Medication changes reported     No    Fall or balance concerns reported    No    Tobacco Cessation No Change    Warm-up and Cool-down Performed on first and last piece of equipment    Resistance Training Performed Yes    VAD Patient? No    PAD/SET Patient? No      Pain Assessment   Currently in Pain? No/denies                Social History   Tobacco Use  Smoking Status Never  Smokeless Tobacco Never    Goals Met:  Proper associated with RPD/PD & O2 Sat Independence with exercise equipment Exercise tolerated well No report of concerns or symptoms today Strength training completed today  Goals Unmet:  Not Applicable  Comments: Pt able to follow exercise prescription today without complaint.  Will continue to monitor for progression.   Dr. Mark Miller is Medical Director for [facility].  Dr. Fuad Aleskerov is Medical Director for [facility] Pulmonary Rehabilitation. 

## 2022-01-23 DIAGNOSIS — I214 Non-ST elevation (NSTEMI) myocardial infarction: Secondary | ICD-10-CM | POA: Diagnosis not present

## 2022-01-23 NOTE — Progress Notes (Signed)
Daily Session Note  Patient Details  Name: TOREE EDLING MRN: [id number] Date of Birth: [birth year] Referring Provider:   Flowsheet Row Cardiac Rehab from 11/17/2021 in [facility] Cardiac and Pulmonary Rehab  Referring Provider End, Harrell Gave MD       Encounter Date: 01/23/2022  Check In:  Session Check In - 01/23/22 1405       Check-In   Supervising physician immediately available to respond to emergencies See telemetry face sheet for immediately available ER MD    Location [facility]    Staff Present Birdie Sons, MPA, Nino Glow, MS, ASCM CEP, Exercise Physiologist;Joseph Tessie Fass, [place]    Virtual Visit No    Medication changes reported     No    Fall or balance concerns reported    No    Tobacco Cessation No Change    Warm-up and Cool-down Performed on first and last piece of equipment    Resistance Training Performed Yes    VAD Patient? No    PAD/SET Patient? No      Pain Assessment   Currently in Pain? No/denies                Social History   Tobacco Use  Smoking Status Never  Smokeless Tobacco Never    Goals Met:  Independence with exercise equipment Exercise tolerated well No report of concerns or symptoms today Strength training completed today  Goals Unmet:  Not Applicable  Comments: Pt able to follow exercise prescription today without complaint.  Will continue to monitor for progression.    Dr. Emily Filbert is Medical Director for [facility].  Dr. Ottie Glazier is Medical Director for [facility] Pulmonary Rehabilitation.

## 2022-01-24 MED ORDER — ENTRESTO 24-26 MG PO TABS: 1.0000 | ORAL_TABLET | Freq: Two times a day (BID) | ORAL | 3 refills | Status: DC

## 2022-01-25 ENCOUNTER — Encounter: Payer: PPO | Admitting: *Deleted

## 2022-01-25 DIAGNOSIS — I214 Non-ST elevation (NSTEMI) myocardial infarction: Secondary | ICD-10-CM

## 2022-01-25 DIAGNOSIS — Z955 Presence of coronary angioplasty implant and graft: Secondary | ICD-10-CM

## 2022-01-25 NOTE — Progress Notes (Signed)
Daily Session Note  Patient Details  Name: Gwendolyn King MRN: [id number] Date of Birth: 02/25/1941 Referring Provider:   Flowsheet Row Cardiac Rehab from 11/17/2021 in [facility] Cardiac and Pulmonary Rehab  Referring Provider End, Harrell Gave MD       Encounter Date: 01/25/2022  Check In:  Session Check In - 01/25/22 1419       Check-In   Supervising physician immediately available to respond to emergencies See telemetry face sheet for immediately available ER MD    Location [facility]    Staff Present Birdie Sons, MPA, RN;Dann Galicia, RN, BSN, CCRP;Joseph Hood, RCP,RRT,BSRT    Virtual Visit No    Medication changes reported     No    Fall or balance concerns reported    No    Warm-up and Cool-down Performed on first and last piece of equipment    Resistance Training Performed Yes    VAD Patient? No    PAD/SET Patient? No      Pain Assessment   Currently in Pain? No/denies                Social History   Tobacco Use  Smoking Status Never  Smokeless Tobacco Never    Goals Met:  Independence with exercise equipment Exercise tolerated well No report of concerns or symptoms today  Goals Unmet:  Not Applicable  Comments: Pt able to follow exercise prescription today without complaint.  Will continue to monitor for progression.    Dr. Emily Filbert is Medical Director for [place].  Dr. Ottie Glazier is Medical Director for [facility] Pulmonary Rehabilitation.

## 2022-01-26 DIAGNOSIS — J301 Allergic rhinitis due to pollen: Secondary | ICD-10-CM | POA: Diagnosis not present

## 2022-01-26 DIAGNOSIS — I214 Non-ST elevation (NSTEMI) myocardial infarction: Secondary | ICD-10-CM | POA: Diagnosis not present

## 2022-01-26 DIAGNOSIS — Z955 Presence of coronary angioplasty implant and graft: Secondary | ICD-10-CM

## 2022-01-26 DIAGNOSIS — J3081 Allergic rhinitis due to animal (cat) (dog) hair and dander: Secondary | ICD-10-CM | POA: Diagnosis not present

## 2022-01-26 DIAGNOSIS — J3089 Other allergic rhinitis: Secondary | ICD-10-CM | POA: Diagnosis not present

## 2022-01-26 NOTE — Progress Notes (Signed)
Daily Session Note  Patient Details  Name: Gwendolyn King MRN: [id number] Date of Birth: [birth year] Referring Provider:   Flowsheet Row Cardiac Rehab from 11/17/2021 in [facility] Cardiac and Pulmonary Rehab  Referring Provider End, Harrell Gave MD       Encounter Date: 01/26/2022  Check In:  Session Check In - 01/26/22 1408       Check-In   Supervising physician immediately available to respond to emergencies See telemetry face sheet for immediately available ER MD    Location [facility] Pulmonary Re[facility]    Staff Present Justin Mend, Lorre Nick, MA, RCEP, CCRP, [place], MPA, RN    Virtual Visit No    Medication changes reported     No    Fall or balance concerns reported    No    Tobacco Cessation No Change    Warm-up and Cool-down Performed on first and last piece of equipment    Resistance Training Performed Yes    VAD Patient? No    PAD/SET Patient? No      Pain Assessment   Currently in Pain? No/denies                Social History   Tobacco Use  Smoking Status Never  Smokeless Tobacco Never    Goals Met:  Independence with exercise equipment Exercise tolerated well No report of concerns or symptoms today Strength training completed today  Goals Unmet:  Not Applicable  Comments: Pt able to follow exercise prescription today without complaint.  Will continue to monitor for progression.    Dr. Emily Filbert is Medical Director for [place].  Dr. Ottie Glazier is Medical Director for [facility] Pulmonary Rehabilitation.

## 2022-01-27 ENCOUNTER — Other Ambulatory Visit: Payer: Self-pay | Admitting: *Deleted

## 2022-01-27 DIAGNOSIS — D0511 Intraductal carcinoma in situ of right breast: Secondary | ICD-10-CM

## 2022-01-27 MED ORDER — LETROZOLE 2.5 MG PO TABS: 2.5000 mg | ORAL_TABLET | Freq: Every day | ORAL | 1 refills | Status: DC

## 2022-01-30 ENCOUNTER — Encounter: Payer: PPO | Admitting: *Deleted

## 2022-01-30 DIAGNOSIS — I214 Non-ST elevation (NSTEMI) myocardial infarction: Secondary | ICD-10-CM | POA: Diagnosis not present

## 2022-01-30 NOTE — Progress Notes (Signed)
Gwendolyn King speaks on questionnaire about back problems and her husband's poor health

## 2022-01-30 NOTE — Progress Notes (Signed)
Daily Session Note  Patient Details  Name: Gwendolyn King MRN: [id number] Date of Birth: 03-11-1941 Referring Provider:   Flowsheet Row Cardiac Rehab from 11/17/2021 in [facility] Cardiac and Pulmonary Rehab  Referring Provider End, Harrell Gave MD       Encounter Date: 01/30/2022  Check In:  Session Check In - 01/30/22 1449       Check-In   Supervising physician immediately available to respond to emergencies See telemetry face sheet for immediately available ER MD    Location [facility] Pul[facility] Re[facility]    Staff Present Heath Lark, RN, BSN, CCRP;Joseph [place], RCP,RRT,BSRT;Jessica [place], [place], [place], CCRP, CCET    Virtual Visit No    Medication changes reported     No    Fall or balance concerns reported    No    Warm-up and Cool-down Performed on first and last piece of equipment    Resistance Training Performed Yes    VAD Patient? No    PAD/SET Patient? No      Pain Assessment   Currently in Pain? No/denies                Social History   Tobacco Use  Smoking Status Never  Smokeless Tobacco Never    Goals Met:  Independence with exercise equipment Exercise tolerated well No report of concerns or symptoms today  Goals Unmet:  Not Applicable  Comments: Pt able to follow exercise prescription today without complaint.  Will continue to monitor for progression.    Dr. Emily Filbert is Medical Director for [place].  Dr. Ottie Glazier is Medical Director for [facility] Pulmonary Rehabilitation.

## 2022-02-01 ENCOUNTER — Encounter: Payer: PPO | Admitting: *Deleted

## 2022-02-01 ENCOUNTER — Encounter: Payer: Self-pay | Admitting: *Deleted

## 2022-02-01 DIAGNOSIS — I214 Non-ST elevation (NSTEMI) myocardial infarction: Secondary | ICD-10-CM | POA: Diagnosis not present

## 2022-02-01 DIAGNOSIS — Z955 Presence of coronary angioplasty implant and graft: Secondary | ICD-10-CM

## 2022-02-01 NOTE — Progress Notes (Signed)
Discharge NOTE Gwendolyn King DOG 04/15/2041   Gwendolyn King graduated today from  rehab with 36 sessions completed.  Details of the patient's exercise prescription and what She needs to do in order to continue the prescription and progress were discussed with patient.  Patient was given a copy of prescription and goals.  Patient verbalized understanding.  Wallace plans to continue to exercise by using exercise equipment at home.   [place] Name 11/17/21 1706 01/18/22 1510       6 Minute Walk   Phase Initial Discharge    Distance 1140 feet 1380 feet    Distance % Change -- 21 %    Distance Feet Change -- 240 ft    Walk Time 6 minutes 6 minutes    # of Rest Breaks 0 0    MPH 2.15 2.15    METS 1.96 2.31    RPE 13 12    Perceived Dyspnea  1 1    VO2 Peak 6.88 8.09    Symptoms Yes (comment) No    Comments Left hip pain 1/10 --    Resting HR 62 bpm 60 bpm    Resting BP 128/72 110/60    Resting Oxygen Saturation  100 % 97 %    Exercise Oxygen Saturation  during 6 min walk 98 % 98 %    Max Ex. HR 86 bpm 89 bpm    Max Ex. BP 160/66 138/60    2 Minute Post BP 130/68 --            Thank you for the referral. We enjoyed working with Gwendolyn King

## 2022-02-01 NOTE — Progress Notes (Signed)
Cardiac Individual Treatment Plan  Patient Details  Name: Gwendolyn King MRN: [id number] Date of Birth: 07/30/1941 Referring Provider:   Flowsheet Row Cardiac Rehab from 11/17/2021 in [facility] Cardiac and Pulmonary Rehab  Referring Provider End, Christopher MD       Initial Encounter Date:  Flowsheet Row Cardiac Rehab from 11/17/2021 in [facility] Cardiac and Pulmonary Rehab  Date 11/17/21       Visit Diagnosis: NSTEMI (non-ST elevated myocardial infarction) [facility])  Patient's Home Medications on Admission:  Current Outpatient Medications:    acetaminophen (TYLENOL) 500 MG tablet, Take 1,000 mg by mouth at bedtime., Disp: , Rfl:    aspirin EC 81 MG tablet, Take 81 mg by mouth at bedtime. , Disp: , Rfl:    AZELASTINE HCL OP, Place 1 drop into both eyes 3 (three) times daily as needed (dry/irritated eyes.). (Patient not taking: Reported on 01/05/2022), Disp: , Rfl:    Biotin 5 MG TABS, Take 5 mg by mouth daily., Disp: , Rfl:    calcium-vitamin D (OSCAL WITH D) 500-200 MG-UNIT tablet, Take 1 tablet by mouth daily. , Disp: , Rfl:    carvedilol (COREG) 6.25 MG tablet, Take 1 tablet (6.25 mg total) by mouth 2 (two) times daily., Disp: 180 tablet, Rfl: 0   cholecalciferol (VITAMIN D) 25 MCG (1000 UNIT) tablet, Take 1,000 Units by mouth every evening., Disp: , Rfl:    clopidogrel (PLAVIX) 75 MG tablet, Take 75 mg by mouth daily., Disp: , Rfl:    EPINEPHrine 0.3 mg/0.3 mL IJ SOAJ injection, Inject 0.3 mg into the muscle as needed for anaphylaxis. (Patient not taking: Reported on 12/23/2021), Disp: , Rfl:    Evolocumab (REPATHA) 140 MG/ML SOSY, Inject 140 mg into the skin every 14 (fourteen) days., Disp: 6 mL, Rfl: 1   Krill Oil 500 MG CAPS, Take 500 mg by mouth daily., Disp: , Rfl:    letrozole (FEMARA) 2.5 MG tablet, Take 1 tablet (2.5 mg total) by mouth daily., Disp: 90 tablet, Rfl: 1   levocetirizine (XYZAL) 5 MG tablet, Take 5 mg by mouth every evening. , Disp: , Rfl: 3   LEVOXYL 75 MCG tablet, Take 1  tablet by mouth once daily, Disp: 90 tablet, Rfl: 2   Multiple Vitamin (MULTIVITAMIN WITH MINERALS) TABS tablet, Take 1 tablet by mouth daily. Adult 50+, Disp: , Rfl:    polyethylene glycol powder (GLYCOLAX/MIRALAX) powder, Take 17 g by mouth daily. 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   CAD (coronary artery disease)    a. 10/2021 NSTEMI/PCI: LM 53m/d, LAD 55ost, 99/ (2.5x15 Onyx Frontier DES), D2 90, LCX 80p/m (2.5x30 Onyx Frontier DES), RCA 50ost, 60p, 78m (nl iFR throughout). LVEDP 20-83mmHg.   Cataract    Bil   Chicken pox    Chronic HFrEF (heart failure with reduced ejection fraction) ([place])    a. 10/2021 Echo: EF 35-40%, mod asymm LVH w/ sev mid-apical ant, apical HK. GrI DD. Nl RV  fxn.   Chronic kidney disease    stage 3   Colon polyp    Constipation    Hyperlipidemia    Hypothyroidism    Ischemic cardiomyopathy    a. 10/2021 Echo: EF 35-40%.   Palpitations    over 20 yeras ago   Personal history of radiation therapy    Thyroid disease     Tobacco Use: Social History   Tobacco Use  Smoking Status Never  Smokeless Tobacco Never    Labs: Review Flowsheet  More data exists      Latest Ref Rng & Units 05/04/2021 06/03/2021 08/08/2021 10/25/2021  Labs for ITP Cardiac and Pulmonary Rehab  Cholestrol 0 - 200 mg/dL 203  - - 177   LDL (calc) 0 - 99 mg/dL 119  - - 108   Direct LDL mg/dL - 132.0  127.0  -  HDL-C >39.00 mg/dL 44.50  - - 40   Trlycerides 0.0 - 149.0 mg/dL 199.0  - - 143   Hemoglobin  A1c 4.6 - 6.5 % - - - 5.2       12/09/2021  Labs for ITP Cardiac and Pulmonary Rehab  Cholestrol 114   LDL (calc) 37   Direct LDL -  HDL-C 47.20   Trlycerides 148.0   Hemoglobin A1c 5.1      Exercise Target Goals: Exercise Program Goal: Individual exercise prescription set using results from initial 6 min walk test and THRR while considering  patient's activity barriers and safety.   Exercise Prescription Goal: Initial exercise prescription builds to 30-45 minutes a day of aerobic activity, 2-3 days per week.  Home exercise guidelines will be given to patient during program as part of exercise prescription that the participant will acknowledge.   Education: Aerobic Exercise: - Group verbal and visual presentation on the components of exercise prescription. Introduces F.I.T.T principle from ACSM for exercise prescriptions.  Reviews F.I.T.T. principles of aerobic exercise including progression. Written material given at graduation.   Education: Resistance Exercise: - Group verbal and visual presentation on the components of exercise prescription. Introduces F.I.T.T principle from ACSM for exercise prescriptions  Reviews F.I.T.T. principles of resistance exercise including progression. Written material given at graduation. Flowsheet Row Cardiac Rehab from 01/18/2022 in [facility] Cardiac and Pulmonary Rehab  Date 12/28/21  Educator [facility]  Instruction Review Code 1- Verbalizes Understanding        Education: Exercise & Equipment Safety: - Individual verbal instruction and demonstration of equipment use and safety with use of the equipment. Flowsheet Row Cardiac Rehab from 01/18/2022 in [facility] Cardiac and Pulmonary Rehab  Date 11/10/21  Educator [facility]  Instruction Review Code 1- Verbalizes Understanding       Education: Exercise Physiology & General Exercise Guidelines: - Group verbal and written instruction with models to review the exercise physiology of the cardiovascular system and  associated critical values. Provides general exercise guidelines with specific guidelines to those with heart or lung disease.  Flowsheet Row Cardiac Rehab from 01/18/2022 in [facility] Cardiac and Pulmonary Rehab  Date 12/14/21  Educator [place]  Instruction Review Code 1- Verbalizes Understanding       Education: Flexibility, Balance, Mind/Body Relaxation: - Group verbal and visual presentation with interactive activity on the components of exercise prescription. Introduces F.I.T.T principle from ACSM for exercise prescriptions. Reviews F.I.T.T. principles of flexibility and balance exercise training including progression. Also discusses the mind body connection.  Reviews various relaxation techniques to help reduce and manage stress (i.e. Deep breathing, progressive muscle relaxation, and visualization). Balance  handout provided to take home. Written material given at graduation. Flowsheet Row Cardiac Rehab from 01/18/2022 in [facility] Cardiac and Pulmonary Rehab  Date 01/04/22  Educator [place]  Instruction Review Code 1- Verbalizes Understanding       Activity Barriers & Risk Stratification:  Activity Barriers & Cardiac Risk Stratification - 11/17/21 1714       Activity Barriers & Cardiac Risk Stratification   Activity Barriers Arthritis;Deconditioning;Muscular Weakness;Other (comment);Right Knee Replacement    Comments Left hip pain    Cardiac Risk Stratification Moderate             6 Minute Walk:  6 Minute Walk     Row Name 11/17/21 1706 01/18/22 1510       6 Minute Walk   Phase Initial Discharge    Distance 1140 feet 1380 feet    Distance % Change -- 21 %    Distance Feet Change -- 240 ft    Walk Time 6 minutes 6 minutes    # of Rest Breaks 0 0    MPH 2.15 2.15    METS 1.96 2.31    RPE 13 12    Perceived Dyspnea  1 1    VO2 Peak 6.88 8.09    Symptoms Yes (comment) No    Comments Left hip pain 1/10 --    Resting HR 62 bpm 60 bpm    Resting BP 128/72 110/60    Resting Oxygen  Saturation  100 % 97 %    Exercise Oxygen Saturation  during 6 min walk 98 % 98 %    Max Ex. HR 86 bpm 89 bpm    Max Ex. BP 160/66 138/60    2 Minute Post BP 130/68 --             Oxygen Initial Assessment:   Oxygen Re-Evaluation:   Oxygen Discharge (Final Oxygen Re-Evaluation):   Initial Exercise Prescription:  Initial Exercise Prescription - 11/17/21 1700       Date of Initial Exercise RX and Referring Provider   Date 11/17/21    Referring Provider End, Cristal Deer MD      Oxygen   Maintain Oxygen Saturation 88% or higher      Treadmill   MPH 2    Grade 0    Minutes 15    METs 2.53      Recumbant Bike   Level 1    RPM 60    Watts 20    Minutes 15    METs 1.9      NuStep   Level 2    SPM 80    Minutes 15    METs 1.9      REL-XR   Level 1    Speed 50    Minutes 15    METs 1.9      T5 Nustep   Level 1    SPM 80    Minutes 15    METs 1.9      Prescription Details   Frequency (times per week) 3    Duration Progress to 30 minutes of continuous aerobic without signs/symptoms of physical distress      Intensity   THRR 40-80% of Max Heartrate 94- 126    Ratings of Perceived Exertion 11-13    Perceived Dyspnea 0-4      Progression   Progression Continue to progress workloads to maintain intensity without signs/symptoms of physical distress.      Resistance Training   Training Prescription Yes  Weight 3 lb    Reps 10-15             Perform Capillary Blood Glucose checks as needed.  Exercise Prescription Changes:   Exercise Prescription Changes     Row Name 11/17/21 1700 11/29/21 1200 12/15/21 0700 12/28/21 1600 01/10/22 1500     Response to Exercise   Blood Pressure (Admit) 128/72 112/60 118/62 136/70 122/60   Blood Pressure (Exercise) 160/66 128/64 160/64 -- --   Blood Pressure (Exit) 130/68 110/60 112/60 118/64 118/62   Heart Rate (Admit) 62 bpm 62 bpm 72 bpm 60 bpm 61 bpm   Heart Rate (Exercise) 86 bpm 107 bpm 102 bpm 76  bpm 94 bpm   Heart Rate (Exit) 67 bpm 68 bpm 78 bpm 58 bpm 75 bpm   Oxygen Saturation (Admit) 100 % -- -- -- 96 %   Oxygen Saturation (Exercise) 98 % -- -- -- 95 %   Oxygen Saturation (Exit) -- -- -- -- 96 %   Rating of Perceived Exertion (Exercise) 13 13 12 13 13    Perceived Dyspnea (Exercise) 1 -- -- -- --   Symptoms Left hip pain 1/10 -- none none none   Comments walk test results second day -- -- --   Duration -- Progress to 30 minutes of  aerobic without signs/symptoms of physical distress Continue with 30 min of aerobic exercise without signs/symptoms of physical distress. Continue with 30 min of aerobic exercise without signs/symptoms of physical distress. Continue with 30 min of aerobic exercise without signs/symptoms of physical distress.   Intensity -- THRR unchanged THRR unchanged THRR unchanged THRR unchanged     Progression   Progression -- Continue to progress workloads to maintain intensity without signs/symptoms of physical distress. Continue to progress workloads to maintain intensity without signs/symptoms of physical distress. Continue to progress workloads to maintain intensity without signs/symptoms of physical distress. Continue to progress workloads to maintain intensity without signs/symptoms of physical distress.   Average METs -- 2.5 3.1 3.3 2.76     Resistance Training   Training Prescription -- Yes Yes Yes Yes   Weight -- 3 lb 3 lb 3 lb 3 lb   Reps -- 10-15 10-15 10-15 10-15     Interval Training   Interval Training -- No No No No     Treadmill   MPH -- -- -- 2 2.2   Grade -- -- -- 0 0   Minutes -- -- -- 15 15   METs -- -- -- 2.53 2.68     Recumbant Bike   Level -- -- 4 4 5    Watts -- -- -- 29 23   Minutes -- -- 15 15 15    METs -- -- -- 3.1 3.12     NuStep   Level -- 3 4 -- --   Minutes -- 15 15 -- --   METs -- 3 3.2 -- --     REL-XR   Level -- -- 4 5 5    Minutes -- -- 12 15 15      T5 Nustep   Level -- -- 4 5 5    Minutes -- -- 15 15 15     METs -- -- 2 2.1 2.2     Biostep-RELP   Level -- 2 4 -- --   Minutes -- 15 15 -- --   METs -- 2 3 -- --     Track   Laps -- 9 -- -- --   Minutes -- 15 -- -- --  METs -- 1.49 -- -- --     Oxygen   Maintain Oxygen Saturation -- -- 88% or higher 88% or higher 88% or higher    Row Name 01/11/22 1500 01/24/22 1500           Response to Exercise   Blood Pressure (Admit) -- 112/62      Blood Pressure (Exit) -- 102/60      Heart Rate (Admit) -- 62 bpm      Heart Rate (Exercise) -- 84 bpm      Heart Rate (Exit) -- 75 bpm      Oxygen Saturation (Admit) -- 98 %      Oxygen Saturation (Exercise) -- 95 %      Oxygen Saturation (Exit) -- 97 %      Rating of Perceived Exertion (Exercise) -- 13      Symptoms -- none      Duration -- Continue with 30 min of aerobic exercise without signs/symptoms of physical distress.      Intensity -- THRR unchanged        Progression   Progression -- Continue to progress workloads to maintain intensity without signs/symptoms of physical distress.      Average METs -- 2.99        Resistance Training   Training Prescription -- Yes      Weight -- 3 lb      Reps -- 10-15        Interval Training   Interval Training -- No        Treadmill   MPH -- 2.4      Grade -- 0.5      Minutes -- 15      METs -- 3        Recumbant Bike   Level -- 4      Watts -- 25      Minutes -- 15      METs -- 3.14        NuStep   Level -- 5      Minutes -- 15      METs -- 3        T5 Nustep   Level -- 1      Minutes -- 15        Home Exercise Plan   Plans to continue exercise at Home (comment)  walking, [employer] (comment)  walking, [employer] staff videos      Frequency Add 2 additional days to program exercise sessions. Add 2 additional days to program exercise sessions.      Initial Home Exercises Provided 01/11/22 01/11/22        Oxygen   Maintain Oxygen Saturation 88% or higher 88% or higher               Exercise Comments:    Exercise Comments     Row Name 11/23/21 1428 02/01/22 1459         Exercise Comments First full day of exercise!  Patient was oriented to gym and equipment including functions, settings, policies, and procedures.  Patient's individual exercise prescription and treatment plan were reviewed.  All starting workloads were established based on the results of the 6 minute walk test done at initial orientation visit.  The plan for exercise progression was also introduced and progression will be customized based on patient's performance and goals. Kewanna graduated today from  rehab with 36 sessions completed.  Details of the patient's exercise prescription and what She  needs to do in order to continue the prescription and progress were discussed with patient.  Patient was given a copy of prescription and goals.  Patient verbalized understanding.  [place]nequa plans to continue to exercise by using exercise equipment at home.               Exercise Goals and Review:   Exercise Goals     Row Name 11/17/21 1717             Exercise Goals   Increase Physical Activity Yes       Intervention Provide advice, education, support and counseling about physical activity/exercise needs.;Develop an individualized exercise prescription for aerobic and resistive training based on initial evaluation findings, risk stratification, comorbidities and participant's personal goals.       Expected Outcomes Short Term: Attend rehab on a regular basis to increase amount of physical activity.;Long Term: Add in home exercise to make exercise part of routine and to increase amount of physical activity.;Long Term: Exercising regularly at least 3-5 days a week.       Increase Strength and Stamina Yes       Intervention Develop an individualized exercise prescription for aerobic and resistive training based on initial evaluation findings, risk stratification, comorbidities and participant's personal goals.;Provide advice,  education, support and counseling about physical activity/exercise needs.       Expected Outcomes Short Term: Increase workloads from initial exercise prescription for resistance, speed, and METs.;Short Term: Perform resistance training exercises routinely during rehab and add in resistance training at home;Long Term: Improve cardiorespiratory fitness, muscular endurance and strength as measured by increased METs and functional capacity (6MWT)       Able to understand and use rate of perceived exertion (RPE) scale Yes       Intervention Provide education and explanation on how to use RPE scale       Expected Outcomes Short Term: Able to use RPE daily in rehab to express subjective intensity level;Long Term:  Able to use RPE to guide intensity level when exercising independently       Able to understand and use Dyspnea scale Yes       Intervention Provide education and explanation on how to use Dyspnea scale       Expected Outcomes Short Term: Able to use Dyspnea scale daily in rehab to express subjective sense of shortness of breath during exertion;Long Term: Able to use Dyspnea scale to guide intensity level when exercising independently       Knowledge and understanding of Target Heart Rate Range (THRR) Yes       Intervention Provide education and explanation of THRR including how the numbers were predicted and where they are located for reference       Expected Outcomes Short Term: Able to state/look up THRR;Long Term: Able to use THRR to govern intensity when exercising independently;Short Term: Able to use daily as guideline for intensity in rehab       Able to check pulse independently Yes       Intervention Provide education and demonstration on how to check pulse in carotid and radial arteries.;Review the importance of being able to check your own pulse for safety during independent exercise       Expected Outcomes Short Term: Able to explain why pulse checking is important during independent  exercise;Long Term: Able to check pulse independently and accurately       Understanding of Exercise Prescription Yes       Intervention Provide  education, explanation, and written materials on patient's individual exercise prescription       Expected Outcomes Short Term: Able to explain program exercise prescription;Long Term: Able to explain home exercise prescription to exercise independently                Exercise Goals Re-Evaluation :  Exercise Goals Re-Evaluation     Row Name 11/23/21 1428 11/29/21 1301 12/15/21 0740 12/22/21 1420 12/28/21 1608     Exercise Goal Re-Evaluation   Exercise Goals Review Able to understand and use rate of perceived exertion (RPE) scale;Knowledge and understanding of Target Heart Rate Range (THRR);Understanding of Exercise Prescription;Increase Physical Activity;Increase Strength and Stamina;Able to understand and use Dyspnea scale;Able to check pulse independently Increase Physical Activity;Increase Strength and Stamina Increase Physical Activity;Increase Strength and Stamina;Understanding of Exercise Prescription Increase Physical Activity;Increase Strength and Stamina;Understanding of Exercise Prescription Increase Physical Activity;Increase Strength and Stamina;Understanding of Exercise Prescription   Comments Reviewed RPE and dyspnea scales, THR and program prescription with pt today.  Pt voiced understanding and was given a copy of goals to take home. Kyelle has tolerated exercise well in her first 2 days.   BP, HR all in normal limits.  Staff will monitor. Sarayu is doing well in rehab.  She is up to level 4 on the on the NuStep.  We will continue to montior her progress. Dinisha reports having a stationary bike, but it can pop her hip and she is going to talk to her PT regarding this. She has not gone over home exercise with EP yet. Aiesha continues to do well in rehab. She has increased to level 5 on both the XR and T5 Nustep. She would benefit from adding  in an incline to her treadmill workload. She also would benefit from increasing to 4lb handweights. Will continue to monitor.   Expected Outcomes Short: Use RPE daily to regulate intensity. Long: Follow program prescription in THR. Short:  attend consistently Long:  improve overall stamina Short: Try 4 lb weights Long: Continue to improve stamina ST: EP to go over home exercise LT: Continue to improve stamina Short: Add incline to treadmill Long: Continue to increase overall MET level    Row Name 01/10/22 1543 01/11/22 1511 01/24/22 1513         Exercise Goal Re-Evaluation   Exercise Goals Review Increase Physical Activity;Increase Strength and Stamina;Understanding of Exercise Prescription Increase Physical Activity;Increase Strength and Stamina;Understanding of Exercise Prescription Increase Physical Activity;Increase Strength and Stamina;Understanding of Exercise Prescription     Comments Alandria has been doing well in rehab.  She is up to level 5 on bike!  We will continue to monitor her progress. Reviewed home exercise with pt today.  Pt plans to walk ar home and do staff Youtube videos for exercise.  Patient is limited going somewhere to exercise as her husband has dementia and someone needs to be with him. We talked about using the [employer] if she is able to either bring him with her of if he is willing to exercise along with. Reviewed THR, pulse, RPE, sign and symptoms, pulse oximetery and when to call 911 or MD.  Also discussed weather considerations and indoor options.  Pt voiced understanding. Raiana is doing well in rehab and is almost ready to graduate. She had a 21% increase on her post 6 minute walk test! She also improved to level 5 on the T4 NuStep. She increased her speed on the treadmill to 2.4 mph and 0.5 % incline. We will continue  to monitor her progress until she graduates.     Expected Outcomes Short: Review home exercise guidelines Long: Continue to improve stamina Short: Start  checking HR during exercise Long: continue to exercise independently at home at appropriate prescription Short: Graduate Long: Continue to exercise independently.              Discharge Exercise Prescription (Final Exercise Prescription Changes):  Exercise Prescription Changes - 01/24/22 1500       Response to Exercise   Blood Pressure (Admit) 112/62    Blood Pressure (Exit) 102/60    Heart Rate (Admit) 62 bpm    Heart Rate (Exercise) 84 bpm    Heart Rate (Exit) 75 bpm    Oxygen Saturation (Admit) 98 %    Oxygen Saturation (Exercise) 95 %    Oxygen Saturation (Exit) 97 %    Rating of Perceived Exertion (Exercise) 13    Symptoms none    Duration Continue with 30 min of aerobic exercise without signs/symptoms of physical distress.    Intensity THRR unchanged      Progression   Progression Continue to progress workloads to maintain intensity without signs/symptoms of physical distress.    Average METs 2.99      Resistance Training   Training Prescription Yes    Weight 3 lb    Reps 10-15      Interval Training   Interval Training No      Treadmill   MPH 2.4    Grade 0.5    Minutes 15    METs 3      Recumbant Bike   Level 4    Watts 25    Minutes 15    METs 3.14      NuStep   Level 5    Minutes 15    METs 3      T5 Nustep   Level 1    Minutes 15      Home Exercise Plan   Plans to continue exercise at Home (comment)   walking, [employer] staff videos   Frequency Add 2 additional days to program exercise sessions.    Initial Home Exercises Provided 01/11/22      Oxygen   Maintain Oxygen Saturation 88% or higher             Nutrition:  Target Goals: Understanding of nutrition guidelines, daily intake of sodium '1500mg'$ , cholesterol '200mg'$ , calories 30% from fat and 7% or less from saturated fats, daily to have 5 or more servings of fruits and vegetables.  Education: All About Nutrition: -Group instruction provided by verbal, written material,  interactive activities, discussions, models, and posters to present general guidelines for heart healthy nutrition including fat, fiber, MyPlate, the role of sodium in heart healthy nutrition, utilization of the nutrition label, and utilization of this knowledge for meal planning. Follow up email sent as well. Written material given at graduation. Flowsheet Row Cardiac Rehab from 01/18/2022 in [facility] Cardiac and Pulmonary Rehab  Date 01/11/22  Educator [facility]  Instruction Review Code 1- Verbalizes Understanding       Biometrics:  Pre Biometrics - 11/17/21 1703       Pre Biometrics   Height 5' 0.25" (1.53 m)    Weight 156 lb (70.8 kg)    BMI (Calculated) 30.23    Single Leg Stand 10 seconds             Post Biometrics - 01/18/22 [place]  Height 5' 0.25" (1.53 m)    Weight 152 lb 1.6 oz (69 kg)    BMI (Calculated) 29.47             Nutrition Therapy Plan and Nutrition Goals:  Nutrition Therapy & Goals - 12/06/21 1301       Nutrition Therapy   Diet Heart healthy, low Na    Protein (specify units) 55g    Fiber 25 grams    Whole Grain Foods 3 servings    Saturated Fats 12 max. grams    Fruits and Vegetables 8 servings/day    Sodium 2 grams      Personal Nutrition Goals   Nutrition Goal ST: practice MyPlate LT: limit saturated fat <12g/day, limit Na <2g/day, eat at least 8 fruit/vegetable servings per day    Comments 81 y.o. F admitted to cardiac rehab for NSTEMI. PMHx includes HTN, hypothyroidism, CKD stg 3, HLD, aortic atherosclerosis, breast cancer (s/p radiation therapy), stroke. Relevant medications include biotin, Ca-vit D, coreg, krill oil, femara, MVI, mitalax, B-12. PYP Score: 55. Vegetables & Fruits 6/12. Breads, Grains & Cereals 6/12. Red & Processed Meat 8/12. Poultry 2/2. Fish & Shellfish 1/4. Beans, Nuts & Seeds 3/4. Milk & Dairy Foods 2/6. Toppings, Oils, Seasonings & Salt 10/20. Sweets, Snacks & Restaurant Food 7/14. Beverages 10/10.  She  reports her husband is picky. She does what she can with cooking. B: mid morning: old fashioned oatmeal with 1/4 tsp butter and coffee (no sugar creamer). L: mid afternoon: after class with get sub from [employer] or something similar (removes some of bread, meat and cheese) D: chicken breast, vegetables, potatoes. She will sometimes have a salad by herself as her husband won't eat it. She uses olive oil or spray, she only will add some salt to oatmeal - she enjoys pepper. Drinks: water with mio flavoring or [employer] flavoring or trulemon. She reports trying to eat less bread. Discussed heart healthy diet.      Intervention Plan   Intervention Prescribe, educate and counsel regarding individualized specific dietary modifications aiming towards targeted core components such as weight, hypertension, lipid management, diabetes, heart failure and other comorbidities.    Expected Outcomes Short Term Goal: Understand basic principles of dietary content, such as calories, fat, sodium, cholesterol and nutrients.;Short Term Goal: A plan has been developed with personal nutrition goals set during dietitian appointment.;Long Term Goal: Adherence to prescribed nutrition plan.             Nutrition Assessments:  MEDIFICTS Score Key: ?70 Need to make dietary changes  40-70 Heart Healthy Diet ? 40 Therapeutic Level Cholesterol Diet  Flowsheet Row Cardiac Rehab from 01/30/2022 in [facility] Cardiac and Pulmonary Rehab  Picture Your Plate Total Score on Admission 55  Picture Your Plate Total Score on Discharge 70      Picture Your Plate Scores: <29 Unhealthy dietary pattern with much room for improvement. 41-50 Dietary pattern unlikely to meet recommendations for good health and room for improvement. 51-60 More healthful dietary pattern, with some room for improvement.  >60 Healthy dietary pattern, although there may be some specific behaviors that could be improved.    Nutrition Goals Re-Evaluation:  Nutrition  Goals Re-Evaluation     [place] Name 12/22/21 1407 01/12/22 1414           Goals   Current Weight -- 153 lb (69.4 kg)      Nutrition Goal ST: make sure every meal has some vegetables - even when her husband wont eat  them  LT: limit saturated fat <12g/day, limit Na <2g/day, eat at least 8 fruit/vegetable servings per day Eat more vegetables and Reduce sweets intake.      Comment She reports her diet has been fair. She is still eating mostly what her husband eats. She is practicing MyPlate structure - she reports not being as good with the colors on her plate. She would like to focus on keeping the "right foods" in her meals - sometimes she will give her husband what he wants and then she will have a salad. Discussed frozen vegetables as an easy side if she wants to include a larger variety of vegetables easily. Barbette Or states that sweets are part of her vices.She is going to try to make more vegetables and eat them since her husband will not eat them.      Expected Outcome ST: make sure every meal has some vegetables - even when her husband wont eat them  LT: limit saturated fat <12g/day, limit Na <2g/day, eat at least 8 fruit/vegetable servings per day Short: eat more vegetables and reduce sweets. Long: adhere to a diet that pertains to her.               Nutrition Goals Discharge (Final Nutrition Goals Re-Evaluation):  Nutrition Goals Re-Evaluation - 01/12/22 1414       Goals   Current Weight 153 lb (69.4 kg)    Nutrition Goal Eat more vegetables and Reduce sweets intake.    Comment Barbette Or states that sweets are part of her vices.She is going to try to make more vegetables and eat them since her husband will not eat them.    Expected Outcome Short: eat more vegetables and reduce sweets. Long: adhere to a diet that pertains to her.             Psychosocial: Target Goals: Acknowledge presence or absence of significant depression and/or stress, maximize coping skills, provide positive  support system. Participant is able to verbalize types and ability to use techniques and skills needed for reducing stress and depression.   Education: Stress, Anxiety, and Depression - Group verbal and visual presentation to define topics covered.  Reviews how body is impacted by stress, anxiety, and depression.  Also discusses healthy ways to reduce stress and to treat/manage anxiety and depression.  Written material given at graduation. Flowsheet Row Cardiac Rehab from 01/18/2022 in [facility] Cardiac and Pulmonary Rehab  Date 12/07/21  Educator [facility]  Instruction Review Code 1- [employer] Understanding       Education: Sleep Hygiene -Provides group verbal and written instruction about how sleep can affect your health.  Define sleep hygiene, discuss sleep cycles and impact of sleep habits. Review good sleep hygiene tips.    Initial Review & Psychosocial Screening:  Initial Psych Review & Screening - 11/10/21 1009       Initial Review   Current issues with None Identified;Current Stress Concerns    Comments Arvilla thinks her husband has dementia and is on of her stressors.      Family Dynamics   Good Support System? Yes    Concerns Recent loss of significant other    Comments Patient has three children andher husband she can look to for support.      Barriers   Psychosocial barriers to participate in program The patient should benefit from training in stress management and relaxation.;There are no identifiable barriers or psychosocial needs.      Screening Interventions   Interventions Encouraged to exercise;To provide  support and resources with identified psychosocial needs;Provide feedback about the scores to participant    Expected Outcomes Short Term goal: Utilizing psychosocial counselor, staff and physician to assist with identification of specific Stressors or current issues interfering with healing process. Setting desired goal for each stressor or current issue identified.;Long  Term Goal: Stressors or current issues are controlled or eliminated.;Short Term goal: Identification and review with participant of any Quality of Life or Depression concerns found by scoring the questionnaire.;Long Term goal: The participant improves quality of Life and PHQ9 Scores as seen by post scores and/or verbalization of changes             Quality of Life Scores:   Quality of Life - 01/30/22 1447       Quality of Life Scores   Health/Function Pre 23.54 %    Health/Function Post 20.89 %    Health/Function % Change -11.26 %    Socioeconomic Pre 27.36 %    Socioeconomic Post 25.64 %    Socioeconomic % Change  -6.29 %    Psych/Spiritual Pre 25 %    Psych/Spiritual Post 24.21 %    Psych/Spiritual % Change -3.16 %    Family Pre 21.6 %    Family Post 18.8 %    Family % Change -12.96 %    GLOBAL Pre 24.37 %    GLOBAL Post 22.29 %    GLOBAL % Change -8.54 %            Scores of 19 and below usually indicate a poorer quality of life in these areas.  A difference of  2-3 points is a clinically meaningful difference.  A difference of 2-3 points in the total score of the Quality of Life Index has been associated with significant improvement in overall quality of life, self-image, physical symptoms, and general health in studies assessing change in quality of life.  PHQ-9: Review Flowsheet  More data exists      01/30/2022 11/23/2021 08/31/2021 05/04/2021 05/18/2020  Depression screen PHQ 2/9  Decreased Interest 0 0 0 0 0  Down, Depressed, Hopeless 0 0 0 0 0  PHQ - 2 Score 0 0 0 0 0  Altered sleeping 1 0 - - -  Tired, decreased energy 0 0 - - -  Change in appetite 0 0 - - -  Feeling bad or failure about yourself  1 0 - - -  Trouble concentrating 0 0 - - -  Moving slowly or fidgety/restless 2 0 - - -  Suicidal thoughts 0 0 - - -  PHQ-9 Score 4 0 - - -  Difficult doing work/chores Somewhat difficult Not difficult at all - - -   Interpretation of Total Score  Total Score  Depression Severity:  1-4 = Minimal depression, 5-9 = Mild depression, 10-14 = Moderate depression, 15-19 = Moderately severe depression, 20-27 = Severe depression   Psychosocial Evaluation and Intervention:  Psychosocial Evaluation - 11/10/21 1010       Psychosocial Evaluation & Interventions   Interventions Encouraged to exercise with the program and follow exercise prescription;Relaxation education;Stress management education    Comments Odalys thinks her husband has dementia and is on of her stressors.Patient has three children andher husband she can look to for support.    Expected Outcomes Short: Start [facility] to help with mood. Long: Maintain a healthy mental state    Continue Psychosocial Services  Follow up required by staff  Psychosocial Re-Evaluation:  Psychosocial Re-Evaluation     [place] Name 12/22/21 1414 01/12/22 1406           Psychosocial Re-Evaluation   Current issues with Current Stress Concerns Current Stress Concerns      Comments She reports some stress due to her husbands early dementia and slowing down. She relies her daughters for support. She likes sudoku, word searches, and she reads to help to reduce stress. She reports having a hard time getting to sleep and she feels this is due to the books she reads, but aside from that she sleeps well. Patient reports no issues with their current mental states, sleep, stress, depression or anxiety. Will follow up with patient in a few weeks for any changes. Her main stressor is dealing with her husbands early onset dementia.      Expected Outcomes ST: continue to do stress reducing activites  LT: maintain positive attitude Short: Continue to exercise regularly to support mental health and notify staff of any changes. Long: maintain mental health and well being through teaching of rehab or prescribed medications independently.      Interventions Encouraged to attend Cardiac Rehabilitation for the exercise  Encouraged to attend Cardiac Rehabilitation for the exercise      Continue Psychosocial Services  Follow up required by staff Follow up required by staff        Initial Review   Source of Stress Concerns Family --               Psychosocial Discharge (Final Psychosocial Re-Evaluation):  Psychosocial Re-Evaluation - 01/12/22 1406       Psychosocial Re-Evaluation   Current issues with Current Stress Concerns    Comments Patient reports no issues with their current mental states, sleep, stress, depression or anxiety. Will follow up with patient in a few weeks for any changes. Her main stressor is dealing with her husbands early onset dementia.    Expected Outcomes Short: Continue to exercise regularly to support mental health and notify staff of any changes. Long: maintain mental health and well being through teaching of rehab or prescribed medications independently.    Interventions Encouraged to attend Cardiac Rehabilitation for the exercise    Continue Psychosocial Services  Follow up required by staff             Vocational Rehabilitation: Provide vocational rehab assistance to qualifying candidates.   Vocational Rehab Evaluation & Intervention:  Vocational Rehab - 01/30/22 1451       Discharge Vocational Rehab   Discharge Vocational Rehabilitation retired             Education: Education Goals: Education classes will be provided on a variety of topics geared toward better understanding of heart health and risk factor modification. Participant will state understanding/return demonstration of topics presented as noted by education test scores.  Learning Barriers/Preferences:  Learning Barriers/Preferences - 11/10/21 1008       Learning Barriers/Preferences   Learning Barriers None    Learning Preferences None             General Cardiac Education Topics:  AED/CPR: - Group verbal and written instruction with the use of models to demonstrate the basic  use of the AED with the basic ABC's of resuscitation.   Anatomy and Cardiac Procedures: - Group verbal and visual presentation and models provide information about basic cardiac anatomy and function. Reviews the testing methods done to diagnose heart disease and the outcomes of the test results. Describes the  treatment choices: Medical Management, Angioplasty, or Coronary Bypass Surgery for treating various heart conditions including Myocardial Infarction, Angina, Valve Disease, and Cardiac Arrhythmias.  Written material given at graduation. Flowsheet Row Cardiac Rehab from 01/18/2022 in [facility] Cardiac and Pulmonary Rehab  Date 12/28/21  Educator SB  Instruction Review Code 1- Verbalizes Understanding       Medication Safety: - Group verbal and visual instruction to review commonly prescribed medications for heart and lung disease. Reviews the medication, class of the drug, and side effects. Includes the steps to properly store meds and maintain the prescription regimen.  Written material given at graduation. Flowsheet Row Cardiac Rehab from 01/18/2022 in [facility] Cardiac and Pulmonary Rehab  Date 01/18/22  Educator KB  Instruction Review Code 1- Verbalizes Understanding       Intimacy: - Group verbal instruction through game format to discuss how heart and lung disease can affect sexual intimacy. Written material given at graduation..   Know Your Numbers and Heart Failure: - Group verbal and visual instruction to discuss disease risk factors for cardiac and pulmonary disease and treatment options.  Reviews associated critical values for Overweight/Obesity, Hypertension, Cholesterol, and Diabetes.  Discusses basics of heart failure: signs/symptoms and treatments.  Introduces Heart Failure Zone chart for action plan for heart failure.  Written material given at graduation. Flowsheet Row Cardiac Rehab from 01/18/2022 in [facility] Cardiac and Pulmonary Rehab  Date 11/23/21  Educator [facility]  Instruction  Review Code 1- Verbalizes Understanding       Infection Prevention: - Provides verbal and written material to individual with discussion of infection control including proper hand washing and proper equipment cleaning during exercise session. Flowsheet Row Cardiac Rehab from 01/18/2022 in [facility] Cardiac and Pulmonary Rehab  Date 11/10/21  Educator [facility]  Instruction Review Code 1- Verbalizes Understanding       Falls Prevention: - Provides verbal and written material to individual with discussion of falls prevention and safety. Flowsheet Row Cardiac Rehab from 01/18/2022 in [facility] Cardiac and Pulmonary Rehab  Date 11/10/21  Educator [facility]  Instruction Review Code 1- Verbalizes Understanding       Other: -Provides group and verbal instruction on various topics (see comments)   Knowledge Questionnaire Score:  Knowledge Questionnaire Score - 01/30/22 1451       Knowledge Questionnaire Score   Pre Score 23/26: Angina, Nutrition    Post Score 26/26             Core Components/Risk Factors/Patient Goals at Admission:  Personal Goals and Risk Factors at Admission - 11/17/21 1718       Core Components/Risk Factors/Patient Goals on Admission    Weight Management Yes;Weight Loss    Intervention Weight Management: Develop a combined nutrition and exercise program designed to reach desired caloric intake, while maintaining appropriate intake of nutrient and fiber, sodium and fats, and appropriate energy expenditure required for the weight goal.;Weight Management: Provide education and appropriate resources to help participant work on and attain dietary goals.;Weight Management/Obesity: Establish reasonable short term and long term weight goals.;Obesity: Provide education and appropriate resources to help participant work on and attain dietary goals.    Admit Weight 156 lb (70.8 kg)    Goal Weight: Short Term 152 lb (68.9 kg)    Goal Weight: Long Term 146 lb (66.2 kg)    Expected Outcomes  Short Term: Continue to assess and modify interventions until short term weight is achieved;Long Term: Adherence to nutrition and physical activity/exercise program aimed toward attainment of established weight goal;Weight  Loss: Understanding of general recommendations for a balanced deficit meal plan, which promotes 1-2 lb weight loss per week and includes a negative energy balance of [phone] kcal/d;Understanding recommendations for meals to include 15-35% energy as protein, 25-35% energy from fat, 35-60% energy from carbohydrates, less than $RemoveB'200mg'pKOEMxsG$  of dietary cholesterol, 20-35 gm of total fiber daily;Understanding of distribution of calorie intake throughout the day with the consumption of 4-5 meals/snacks    Hypertension Yes    Intervention Provide education on lifestyle modifcations including regular physical activity/exercise, weight management, moderate sodium restriction and increased consumption of fresh fruit, vegetables, and low fat dairy, alcohol moderation, and smoking cessation.;Monitor prescription use compliance.    Expected Outcomes Short Term: Continued assessment and intervention until BP is < 140/76mm HG in hypertensive participants. < 130/10mm HG in hypertensive participants with diabetes, heart failure or chronic kidney disease.;Long Term: Maintenance of blood pressure at goal levels.    Lipids Yes    Intervention Provide education and support for participant on nutrition & aerobic/resistive exercise along with prescribed medications to achieve LDL '70mg'$ , HDL >$Remo'40mg'xmSec$ .    Expected Outcomes Short Term: Participant states understanding of desired cholesterol values and is compliant with medications prescribed. Participant is following exercise prescription and nutrition guidelines.;Long Term: Cholesterol controlled with medications as prescribed, with individualized exercise RX and with personalized nutrition plan. Value goals: LDL < $Rem'70mg'ThNr$ , HDL > 40 mg.             Education:Diabetes -  Individual verbal and written instruction to review signs/symptoms of diabetes, desired ranges of glucose level fasting, after meals and with exercise. Acknowledge that pre and post exercise glucose checks will be done for 3 sessions at entry of program.   Core Components/Risk Factors/Patient Goals Review:   Goals and Risk Factor Review     Row Name 12/22/21 1410 01/12/22 1409           Core Components/Risk Factors/Patient Goals Review   Personal Goals Review Hypertension;Lipids;Weight Management/Obesity Weight Management/Obesity;Hypertension      Review She will check her BP at home every once in a while - encouraged her to take her BP when she is not at rehab. She reports that since starting rehab she has lost 5 lbs. She takes her medications as prescribed with no issues. She continues to work on diet and exercise. Barbette Or has been checking her blood pressure at home. She states that her readings have been good at home. Her readings have been doing well and withun normal limits.She would be interested in lseing. She wants to reach a weigth goal of 140lbs.      Expected Outcomes ST: check BP when not at rehab LT: continue to monitor risk factors Short: lose 5 pounds in the next couple of weeks. Long: Reach weight goal of 140lbs.               Core Components/Risk Factors/Patient Goals at Discharge (Final Review):   Goals and Risk Factor Review - 01/12/22 1409       Core Components/Risk Factors/Patient Goals Review   Personal Goals Review Weight Management/Obesity;Hypertension    Review Barbette Or has been checking her blood pressure at home. She states that her readings have been good at home. Her readings have been doing well and withun normal limits.She would be interested in lseing. She wants to reach a weigth goal of 140lbs.    Expected Outcomes Short: lose 5 pounds in the next couple of weeks. Long: Reach weight goal of 140lbs.  ITP Comments:  ITP Comments     Row  Name 11/10/21 1006 11/17/21 1652 11/23/21 1428 12/06/21 1343 12/07/21 1330   ITP Comments Virtual Visit completed. Patient informed on EP and RD appointment and 6 Minute walk test. Patient also informed of patient health questionnaires on My Chart. Patient Verbalizes understanding. Visit diagnosis can be found in [facility] 10/24/2021. Completed 6MWT and gym orientation. Initial ITP created and sent for review to Dr. Emily Filbert, Medical Director. First full day of exercise!  Patient was oriented to gym and equipment including functions, settings, policies, and procedures.  Patient's individual exercise prescription and treatment plan were reviewed.  All starting workloads were established based on the results of the 6 minute walk test done at initial orientation visit.  The plan for exercise progression was also introduced and progression will be customized based on patient's performance and goals. Completed initial RD consultation 30 Day review completed. Medical Director ITP review done, changes made as directed, and signed approval by Medical Director.    [place] Name 01/04/22 0840 02/01/22 1235 02/01/22 1458       ITP Comments 30 Day review completed. Medical Director ITP review done, changes made as directed, and signed approval by Medical Director. 30 Day review completed. Medical Director ITP review done, changes made as directed, and signed approval by Medical Director. Jeidi graduated today from  rehab with 36 sessions completed.  Details of the patient's exercise prescription and what She needs to do in order to continue the prescription and progress were discussed with patient.  Patient was given a copy of prescription and goals.  Patient verbalized understanding.  Zakari plans to continue to exercise by using exercise equipment at home.              Comments: Discharge ITP

## 2022-02-01 NOTE — Progress Notes (Signed)
Daily Session Note  Patient Details  Name: Gwendolyn King MRN: [id number] Date of Birth: [birth year] Referring Provider:   Flowsheet Row Cardiac Rehab from 11/17/2021 in [facility]  Referring Provider End, Harrell Gave MD       Encounter Date: 02/01/2022  Check In:  Session Check In - 02/01/22 1457       Check-In   Supervising physician immediately available to respond to emergencies See telemetry face sheet for immediately available ER MD    Location [facility]-Cardiac & Pul[facility]    Staff Present Heath Lark, RN, BSN, CCRP;Melissa Bigelow, RDN, Tawanna Solo, MS, ASCM CEP, Exercise Physiologist    Virtual Visit No    Medication changes reported     No    Fall or balance concerns reported    No    Warm-up and Cool-down Performed on first and last piece of equipment    Resistance Training Performed Yes    VAD Patient? No    PAD/SET Patient? No      Pain Assessment   Currently in Pain? No/denies                Social History   Tobacco Use  Smoking Status Never  Smokeless Tobacco Never    Goals Met:  Independence with exercise equipment Exercise tolerated well No report of concerns or symptoms today  Goals Unmet:  Not Applicable  Comments:  [name] graduated today from  rehab with 36 sessions completed.  Details of the patient's exercise prescription and what She needs to do in order to continue the prescription and progress were discussed with patient.  Patient was given a copy of prescription and goals.  Patient verbalized understanding.  [name] plans to continue to exercise by using exercise equipment at home.    Dr. Emily Filbert is Medical Director for [place].  Dr. Ottie Glazier is Medical Director for [facility].

## 2022-02-01 NOTE — Progress Notes (Signed)
Cardiac Individual Treatment Plan  Patient Details  Name: Gwendolyn King MRN: [id number] Date of Birth: Jun 02, 1941 Referring Provider:   Flowsheet Row Cardiac Rehab from 11/17/2021 in [facility] Cardiac and Pulmonary Rehab  Referring Provider End, Harrell Gave MD       Initial Encounter Date:  Flowsheet Row Cardiac Rehab from 11/17/2021 in [facility] Cardiac and Pulmonary Rehab  Date 11/17/21       Visit Diagnosis: NSTEMI (non-ST elevated myocardial infarction) [facility])  S/P coronary artery stent placement  Patient's Home Medications on Admission:  Current Outpatient Medications:    acetaminophen (TYLENOL) 500 MG tablet, Take 1,000 mg by mouth at bedtime., Disp: , Rfl:    aspirin EC 81 MG tablet, Take 81 mg by mouth at bedtime. , Disp: , Rfl:    AZELASTINE HCL OP, Place 1 drop into both eyes 3 (three) times daily as needed (dry/irritated eyes.). (Patient not taking: Reported on 01/05/2022), Disp: , Rfl:    Biotin 5 MG TABS, Take 5 mg by mouth daily., Disp: , Rfl:    calcium-vitamin D (OSCAL WITH D) 500-200 MG-UNIT tablet, Take 1 tablet by mouth daily. , Disp: , Rfl:    carvedilol (COREG) 6.25 MG tablet, Take 1 tablet (6.25 mg total) by mouth 2 (two) times daily., Disp: 180 tablet, Rfl: 0   cholecalciferol (VITAMIN D) 25 MCG (1000 UNIT) tablet, Take 1,000 Units by mouth every evening., Disp: , Rfl:    clopidogrel (PLAVIX) 75 MG tablet, Take 75 mg by mouth daily., Disp: , Rfl:    EPINEPHrine 0.3 mg/0.3 mL IJ SOAJ injection, Inject 0.3 mg into the muscle as needed for anaphylaxis. (Patient not taking: Reported on 12/23/2021), Disp: , Rfl:    Evolocumab (REPATHA) 140 MG/ML SOSY, Inject 140 mg into the skin every 14 (fourteen) days., Disp: 6 mL, Rfl: 1   Krill Oil 500 MG CAPS, Take 500 mg by mouth daily., Disp: , Rfl:    letrozole (FEMARA) 2.5 MG tablet, Take 1 tablet (2.5 mg total) by mouth daily., Disp: 90 tablet, Rfl: 1   levocetirizine (XYZAL) 5 MG tablet, Take 5 mg by mouth every evening. , Disp: ,  Rfl: 3   LEVOXYL 75 MCG tablet, Take 1 tablet by mouth once daily, Disp: 90 tablet, Rfl: 2   Multiple Vitamin (MULTIVITAMIN WITH MINERALS) TABS tablet, Take 1 tablet by mouth daily. Adult 50+, Disp: , Rfl:    polyethylene glycol powder (GLYCOLAX/MIRALAX) powder, Take 17 g by mouth daily. 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   CAD (coronary artery disease)    a. 10/2021 NSTEMI/PCI: LM 65m/d, LAD 55ost, 99/ (2.5x15 Onyx Frontier DES), D2 90, LCX 80p/m (2.5x30 Onyx Frontier DES), RCA 50ost, 60p, 30m (nl iFR throughout). LVEDP 20-38mmHg.   Cataract    Bil   Chicken pox    Chronic HFrEF (heart failure with reduced ejection fraction) ([place])    a. 10/2021 Echo: EF 35-40%, mod asymm LVH w/ sev mid-apical ant,  apical HK. GrI DD. Nl RV fxn.   Chronic kidney disease    stage 3   Colon polyp    Constipation    Hyperlipidemia    Hypothyroidism    Ischemic cardiomyopathy    a. 10/2021 Echo: EF 35-40%.   Palpitations    over 20 yeras ago   Personal history of radiation therapy    Thyroid disease     Tobacco Use: Social History   Tobacco Use  Smoking Status Never  Smokeless Tobacco Never    Labs: Review Flowsheet  More data exists      Latest Ref Rng & Units 05/04/2021 06/03/2021 08/08/2021 10/25/2021  Labs for ITP Cardiac and Pulmonary Rehab  Cholestrol 0 - 200 mg/dL 203  - - 177   LDL (calc) 0 - 99 mg/dL 119  - - 108   Direct LDL mg/dL - 132.0  127.0  -  HDL-C >39.00 mg/dL 44.50  - - 40   Trlycerides 0.0 -  149.0 mg/dL 199.0  - - 143   Hemoglobin A1c 4.6 - 6.5 % - - - 5.2       12/09/2021  Labs for ITP Cardiac and Pulmonary Rehab  Cholestrol 114   LDL (calc) 37   Direct LDL -  HDL-C 47.20   Trlycerides 148.0   Hemoglobin A1c 5.1      Exercise Target Goals: Exercise Program Goal: Individual exercise prescription set using results from initial 6 min walk test and THRR while considering  patient's activity barriers and safety.   Exercise Prescription Goal: Initial exercise prescription builds to 30-45 minutes a day of aerobic activity, 2-3 days per week.  Home exercise guidelines will be given to patient during program as part of exercise prescription that the participant will acknowledge.   Education: Aerobic Exercise: - Group verbal and visual presentation on the components of exercise prescription. Introduces F.I.T.T principle from ACSM for exercise prescriptions.  Reviews F.I.T.T. principles of aerobic exercise including progression. Written material given at graduation.   Education: Resistance Exercise: - Group verbal and visual presentation on the components of exercise prescription. Introduces F.I.T.T principle from ACSM for exercise prescriptions  Reviews F.I.T.T. principles of resistance exercise including progression. Written material given at graduation. Flowsheet Row Cardiac Rehab from 01/18/2022 in [facility] Cardiac and Pulmonary Rehab  Date 12/28/21  Educator [facility]  Instruction Review Code 1- Verbalizes Understanding        Education: Exercise & Equipment Safety: - Individual verbal instruction and demonstration of equipment use and safety with use of the equipment. Flowsheet Row Cardiac Rehab from 01/18/2022 in [facility] Cardiac and Pulmonary Rehab  Date 11/10/21  Educator [facility]  Instruction Review Code 1- Verbalizes Understanding       Education: Exercise Physiology & General Exercise Guidelines: - Group verbal and written instruction with models to review the exercise  physiology of the cardiovascular system and associated critical values. Provides general exercise guidelines with specific guidelines to those with heart King lung disease.  Flowsheet Row Cardiac Rehab from 01/18/2022 in [facility] Cardiac and Pulmonary Rehab  Date 12/14/21  Educator [place]  Instruction Review Code 1- Verbalizes Understanding       Education: Flexibility, Balance, Mind/Body Relaxation: - Group verbal and visual presentation with interactive activity on the components of exercise prescription. Introduces F.I.T.T principle from ACSM for exercise prescriptions. Reviews F.I.T.T. principles of flexibility and balance exercise training including progression. Also discusses the mind body connection.  Reviews various relaxation techniques to help reduce and manage stress (i.e. Deep breathing,  progressive muscle relaxation, and visualization). Balance handout provided to take home. Written material given at graduation. Flowsheet Row Cardiac Rehab from 01/18/2022 in [facility] Cardiac and Pulmonary Rehab  Date 01/04/22  Educator [place]  Instruction Review Code 1- Verbalizes Understanding       Activity Barriers & Risk Stratification:  Activity Barriers & Cardiac Risk Stratification - 11/17/21 1714       Activity Barriers & Cardiac Risk Stratification   Activity Barriers Arthritis;Deconditioning;Muscular Weakness;Other (comment);Right Knee Replacement    Comments Left hip pain    Cardiac Risk Stratification Moderate             6 Minute Walk:  6 Minute Walk     Row Name 11/17/21 1706 01/18/22 1510       6 Minute Walk   Phase Initial Discharge    Distance 1140 feet 1380 feet    Distance % Change -- 21 %    Distance Feet Change -- 240 ft    Walk Time 6 minutes 6 minutes    # of Rest Breaks 0 0    MPH 2.15 2.15    METS 1.96 2.31    RPE 13 12    Perceived Dyspnea  1 1    VO2 Peak 6.88 8.09    Symptoms Yes (comment) No    Comments Left hip pain 1/10 --    Resting HR 62 bpm 60 bpm     Resting BP 128/72 110/60    Resting Oxygen Saturation  100 % 97 %    Exercise Oxygen Saturation  during 6 min walk 98 % 98 %    Max Ex. HR 86 bpm 89 bpm    Max Ex. BP 160/66 138/60    2 Minute Post BP 130/68 --             Oxygen Initial Assessment:   Oxygen Re-Evaluation:   Oxygen Discharge (Final Oxygen Re-Evaluation):   Initial Exercise Prescription:  Initial Exercise Prescription - 11/17/21 1700       Date of Initial Exercise RX and Referring Provider   Date 11/17/21    Referring Provider End, Harrell Gave MD      Oxygen   Maintain Oxygen Saturation 88% King higher      Treadmill   MPH 2    Grade 0    Minutes 15    METs 2.53      Recumbant Bike   Level 1    RPM 60    Watts 20    Minutes 15    METs 1.9      NuStep   Level 2    SPM 80    Minutes 15    METs 1.9      REL-XR   Level 1    Speed 50    Minutes 15    METs 1.9      T5 Nustep   Level 1    SPM 80    Minutes 15    METs 1.9      Prescription Details   Frequency (times per week) 3    Duration Progress to 30 minutes of continuous aerobic without signs/symptoms of physical distress      Intensity   THRR 40-80% of Max Heartrate 94- 126    Ratings of Perceived Exertion 11-13    Perceived Dyspnea 0-4      Progression   Progression Continue to progress workloads to maintain intensity without signs/symptoms of physical distress.      Resistance Training  Training Prescription Yes    Weight 3 lb    Reps 10-15             Perform Capillary Blood Glucose checks as needed.  Exercise Prescription Changes:   Exercise Prescription Changes     Row Name 11/17/21 1700 11/29/21 1200 12/15/21 0700 12/28/21 1600 01/10/22 1500     Response to Exercise   Blood Pressure (Admit) 128/72 112/60 118/62 136/70 122/60   Blood Pressure (Exercise) 160/66 128/64 160/64 -- --   Blood Pressure (Exit) 130/68 110/60 112/60 118/64 118/62   Heart Rate (Admit) 62 bpm 62 bpm 72 bpm 60 bpm 61 bpm   Heart  Rate (Exercise) 86 bpm 107 bpm 102 bpm 76 bpm 94 bpm   Heart Rate (Exit) 67 bpm 68 bpm 78 bpm 58 bpm 75 bpm   Oxygen Saturation (Admit) 100 % -- -- -- 96 %   Oxygen Saturation (Exercise) 98 % -- -- -- 95 %   Oxygen Saturation (Exit) -- -- -- -- 96 %   Rating of Perceived Exertion (Exercise) 13 13 12 13 13    Perceived Dyspnea (Exercise) 1 -- -- -- --   Symptoms Left hip pain 1/10 -- none none none   Comments walk test results second day -- -- --   Duration -- Progress to 30 minutes of  aerobic without signs/symptoms of physical distress Continue with 30 min of aerobic exercise without signs/symptoms of physical distress. Continue with 30 min of aerobic exercise without signs/symptoms of physical distress. Continue with 30 min of aerobic exercise without signs/symptoms of physical distress.   Intensity -- THRR unchanged THRR unchanged THRR unchanged THRR unchanged     Progression   Progression -- Continue to progress workloads to maintain intensity without signs/symptoms of physical distress. Continue to progress workloads to maintain intensity without signs/symptoms of physical distress. Continue to progress workloads to maintain intensity without signs/symptoms of physical distress. Continue to progress workloads to maintain intensity without signs/symptoms of physical distress.   Average METs -- 2.5 3.1 3.3 2.76     Resistance Training   Training Prescription -- Yes Yes Yes Yes   Weight -- 3 lb 3 lb 3 lb 3 lb   Reps -- 10-15 10-15 10-15 10-15     Interval Training   Interval Training -- No No No No     Treadmill   MPH -- -- -- 2 2.2   Grade -- -- -- 0 0   Minutes -- -- -- 15 15   METs -- -- -- 2.53 2.68     Recumbant Bike   Level -- -- 4 4 5    Watts -- -- -- 29 23   Minutes -- -- 15 15 15    METs -- -- -- 3.1 3.12     NuStep   Level -- 3 4 -- --   Minutes -- 15 15 -- --   METs -- 3 3.2 -- --     REL-XR   Level -- -- 4 5 5    Minutes -- -- 12 15 15      T5 Nustep   Level --  -- 4 5 5    Minutes -- -- 15 15 15    METs -- -- 2 2.1 2.2     Biostep-RELP   Level -- 2 4 -- --   Minutes -- 15 15 -- --   METs -- 2 3 -- --     Track   Laps -- 9 -- -- --  Minutes -- 15 -- -- --   METs -- 1.49 -- -- --     Oxygen   Maintain Oxygen Saturation -- -- 88% King higher 88% King higher 88% King higher    Row Name 01/11/22 1500 01/24/22 1500           Response to Exercise   Blood Pressure (Admit) -- 112/62      Blood Pressure (Exit) -- 102/60      Heart Rate (Admit) -- 62 bpm      Heart Rate (Exercise) -- 84 bpm      Heart Rate (Exit) -- 75 bpm      Oxygen Saturation (Admit) -- 98 %      Oxygen Saturation (Exercise) -- 95 %      Oxygen Saturation (Exit) -- 97 %      Rating of Perceived Exertion (Exercise) -- 13      Symptoms -- none      Duration -- Continue with 30 min of aerobic exercise without signs/symptoms of physical distress.      Intensity -- THRR unchanged        Progression   Progression -- Continue to progress workloads to maintain intensity without signs/symptoms of physical distress.      Average METs -- 2.99        Resistance Training   Training Prescription -- Yes      Weight -- 3 lb      Reps -- 10-15        Interval Training   Interval Training -- No        Treadmill   MPH -- 2.4      Grade -- 0.5      Minutes -- 15      METs -- 3        Recumbant Bike   Level -- 4      Watts -- 25      Minutes -- 15      METs -- 3.14        NuStep   Level -- 5      Minutes -- 15      METs -- 3        T5 Nustep   Level -- 1      Minutes -- 15        Home Exercise Plan   Plans to continue exercise at Home (comment)  walking, [employer] (comment)  walking, [employer] staff videos      Frequency Add 2 additional days to program exercise sessions. Add 2 additional days to program exercise sessions.      Initial Home Exercises Provided 01/11/22 01/11/22        Oxygen   Maintain Oxygen Saturation 88% King higher 88% King higher                Exercise Comments:   Exercise Comments     Row Name 11/23/21 1428           Exercise Comments First full day of exercise!  Patient was oriented to gym and equipment including functions, settings, policies, and procedures.  Patient's individual exercise prescription and treatment plan were reviewed.  All starting workloads were established based on the results of the 6 minute walk test done at initial orientation visit.  The plan for exercise progression was also introduced and progression will be customized based on patient's performance and goals.  Exercise Goals and Review:   Exercise Goals     Row Name 11/17/21 1717             Exercise Goals   Increase Physical Activity Yes       Intervention Provide advice, education, support and counseling about physical activity/exercise needs.;Develop an individualized exercise prescription for aerobic and resistive training based on initial evaluation findings, risk stratification, comorbidities and participant's personal goals.       Expected Outcomes Short Term: Attend rehab on a regular basis to increase amount of physical activity.;Long Term: Add in home exercise to make exercise part of routine and to increase amount of physical activity.;Long Term: Exercising regularly at least 3-5 days a week.       Increase Strength and Stamina Yes       Intervention Develop an individualized exercise prescription for aerobic and resistive training based on initial evaluation findings, risk stratification, comorbidities and participant's personal goals.;Provide advice, education, support and counseling about physical activity/exercise needs.       Expected Outcomes Short Term: Increase workloads from initial exercise prescription for resistance, speed, and METs.;Short Term: Perform resistance training exercises routinely during rehab and add in resistance training at home;Long Term: Improve cardiorespiratory fitness,  muscular endurance and strength as measured by increased METs and functional capacity (6MWT)       Able to understand and use rate of perceived exertion (RPE) scale Yes       Intervention Provide education and explanation on how to use RPE scale       Expected Outcomes Short Term: Able to use RPE daily in rehab to express subjective intensity level;Long Term:  Able to use RPE to guide intensity level when exercising independently       Able to understand and use Dyspnea scale Yes       Intervention Provide education and explanation on how to use Dyspnea scale       Expected Outcomes Short Term: Able to use Dyspnea scale daily in rehab to express subjective sense of shortness of breath during exertion;Long Term: Able to use Dyspnea scale to guide intensity level when exercising independently       Knowledge and understanding of Target Heart Rate Range (THRR) Yes       Intervention Provide education and explanation of THRR including how the numbers were predicted and where they are located for reference       Expected Outcomes Short Term: Able to state/look up THRR;Long Term: Able to use THRR to govern intensity when exercising independently;Short Term: Able to use daily as guideline for intensity in rehab       Able to check pulse independently Yes       Intervention Provide education and demonstration on how to check pulse in carotid and radial arteries.;Review the importance of being able to check your own pulse for safety during independent exercise       Expected Outcomes Short Term: Able to explain why pulse checking is important during independent exercise;Long Term: Able to check pulse independently and accurately       Understanding of Exercise Prescription Yes       Intervention Provide education, explanation, and written materials on patient's individual exercise prescription       Expected Outcomes Short Term: Able to explain program exercise prescription;Long Term: Able to explain home  exercise prescription to exercise independently                Exercise Goals Re-Evaluation :  Exercise Goals Re-Evaluation     Row Name 11/23/21 1428 11/29/21 1301 12/15/21 0740 12/22/21 1420 12/28/21 1608     Exercise Goal Re-Evaluation   Exercise Goals Review Able to understand and use rate of perceived exertion (RPE) scale;Knowledge and understanding of Target Heart Rate Range (THRR);Understanding of Exercise Prescription;Increase Physical Activity;Increase Strength and Stamina;Able to understand and use Dyspnea scale;Able to check pulse independently Increase Physical Activity;Increase Strength and Stamina Increase Physical Activity;Increase Strength and Stamina;Understanding of Exercise Prescription Increase Physical Activity;Increase Strength and Stamina;Understanding of Exercise Prescription Increase Physical Activity;Increase Strength and Stamina;Understanding of Exercise Prescription   Comments Reviewed RPE and dyspnea scales, THR and program prescription with pt today.  Pt voiced understanding and was given a copy of goals to take home. Gwendolyn King has tolerated exercise well in her first 2 days.   BP, HR all in normal limits.  Staff will monitor. Gwendolyn King is doing well in rehab.  She is up to level 4 on the on the NuStep.  We will continue to montior her progress. Gwendolyn King reports having a stationary bike, but it can pop her hip and she is going to talk to her PT regarding this. She has not gone over home exercise with EP yet. Gwendolyn King continues to do well in rehab. She has increased to level 5 on both the XR and T5 Nustep. She would benefit from adding in an incline to her treadmill workload. She also would benefit from increasing to 4lb handweights. Will continue to monitor.   Expected Outcomes Short: Use RPE daily to regulate intensity. Long: Follow program prescription in THR. Short:  attend consistently Long:  improve overall stamina Short: Try 4 lb weights Long: Continue to improve stamina ST:  EP to go over home exercise LT: Continue to improve stamina Short: Add incline to treadmill Long: Continue to increase overall MET level    Row Name 01/10/22 1543 01/11/22 1511 01/24/22 1513         Exercise Goal Re-Evaluation   Exercise Goals Review Increase Physical Activity;Increase Strength and Stamina;Understanding of Exercise Prescription Increase Physical Activity;Increase Strength and Stamina;Understanding of Exercise Prescription Increase Physical Activity;Increase Strength and Stamina;Understanding of Exercise Prescription     Comments Gwendolyn King has been doing well in rehab.  She is up to level 5 on bike!  We will continue to monitor her progress. Reviewed home exercise with pt today.  Pt plans to walk ar home and do staff Youtube videos for exercise.  Patient is limited going somewhere to exercise as her husband has dementia and someone needs to be with him. We talked about using the [employer] if she is able to either bring him with her of if he is willing to exercise along with. Reviewed THR, pulse, RPE, sign and symptoms, pulse oximetery and when to call 911 or MD.  Also discussed weather considerations and indoor options.  Pt voiced understanding. Gwendolyn King is doing well in rehab and is almost ready to graduate. She had a 21% increase on her post 6 minute walk test! She also improved to level 5 on the T4 NuStep. She increased her speed on the treadmill to 2.4 mph and 0.5 % incline. We will continue to monitor her progress until she graduates.     Expected Outcomes Short: Review home exercise guidelines Long: Continue to improve stamina Short: Start checking HR during exercise Long: continue to exercise independently at home at appropriate prescription Short: Graduate Long: Continue to exercise independently.  Discharge Exercise Prescription (Final Exercise Prescription Changes):  Exercise Prescription Changes - 01/24/22 1500       Response to Exercise   Blood Pressure (Admit)  112/62    Blood Pressure (Exit) 102/60    Heart Rate (Admit) 62 bpm    Heart Rate (Exercise) 84 bpm    Heart Rate (Exit) 75 bpm    Oxygen Saturation (Admit) 98 %    Oxygen Saturation (Exercise) 95 %    Oxygen Saturation (Exit) 97 %    Rating of Perceived Exertion (Exercise) 13    Symptoms none    Duration Continue with 30 min of aerobic exercise without signs/symptoms of physical distress.    Intensity THRR unchanged      Progression   Progression Continue to progress workloads to maintain intensity without signs/symptoms of physical distress.    Average METs 2.99      Resistance Training   Training Prescription Yes    Weight 3 lb    Reps 10-15      Interval Training   Interval Training No      Treadmill   MPH 2.4    Grade 0.5    Minutes 15    METs 3      Recumbant Bike   Level 4    Watts 25    Minutes 15    METs 3.14      NuStep   Level 5    Minutes 15    METs 3      T5 Nustep   Level 1    Minutes 15      Home Exercise Plan   Plans to continue exercise at Home (comment)   walking, [employer] staff videos   Frequency Add 2 additional days to program exercise sessions.    Initial Home Exercises Provided 01/11/22      Oxygen   Maintain Oxygen Saturation 88% King higher             Nutrition:  Target Goals: Understanding of nutrition guidelines, daily intake of sodium '1500mg'$ , cholesterol '200mg'$ , calories 30% from fat and 7% King less from saturated fats, daily to have 5 King more servings of fruits and vegetables.  Education: All About Nutrition: -Group instruction provided by verbal, written material, interactive activities, discussions, models, and posters to present general guidelines for heart healthy nutrition including fat, fiber, MyPlate, the role of sodium in heart healthy nutrition, utilization of the nutrition label, and utilization of this knowledge for meal planning. Follow up email sent as well. Written material given at graduation. Flowsheet Row  Cardiac Rehab from 01/18/2022 in [facility] Cardiac and Pulmonary Rehab  Date 01/11/22  Educator [facility]  Instruction Review Code 1- Verbalizes Understanding       Biometrics:  Pre Biometrics - 11/17/21 1703       Pre Biometrics   Height 5' 0.25" (1.53 m)    Weight 156 lb (70.8 kg)    BMI (Calculated) 30.23    Single Leg Stand 10 seconds             Post Biometrics - 01/18/22 1514        Post  Biometrics   Height 5' 0.25" (1.53 m)    Weight 152 lb 1.6 oz (69 kg)    BMI (Calculated) 29.47             Nutrition Therapy Plan and Nutrition Goals:  Nutrition Therapy & Goals - 12/06/21 1301       Nutrition Therapy   Diet  Heart healthy, low Na    Protein (specify units) 55g    Fiber 25 grams    [employer] 3 servings    Saturated Fats 12 max. grams    Fruits and Vegetables 8 servings/day    Sodium 2 grams      Personal Nutrition Goals   Nutrition Goal ST: practice MyPlate LT: limit saturated fat <12g/day, limit Na <2g/day, eat at least 8 fruit/vegetable servings per day    Comments 81 y.o. F admitted to cardiac rehab for NSTEMI. PMHx includes HTN, hypothyroidism, CKD stg 3, HLD, aortic atherosclerosis, breast cancer (s/p radiation therapy), stroke. Relevant medications include biotin, Ca-vit D, coreg, krill oil, femara, MVI, mitalax, B-12. PYP Score: 55. Vegetables & Fruits 6/12. Breads, Grains & Cereals 6/12. Red & Processed Meat 8/12. Poultry 2/2. Fish & Shellfish 1/4. Beans, Nuts & Seeds 3/4. Milk & Dairy Foods 2/6. Toppings, Oils, Seasonings & Salt 10/20. Sweets, Snacks & Restaurant Food 7/14. Beverages 10/10.  She reports her husband is picky. She does what she can with cooking. B: mid morning: old fashioned oatmeal with 1/4 tsp butter and coffee (no sugar creamer). L: mid afternoon: after class with get sub from [employer] King something similar (removes some of bread, meat and cheese) D: chicken breast, vegetables, potatoes. She will sometimes have a salad by herself as her  husband won't eat it. She uses olive oil King spray, she only will add some salt to oatmeal - she enjoys pepper. Drinks: water with mio flavoring King [employer] flavoring King trulemon. She reports trying to eat less bread. Discussed heart healthy diet.      Intervention Plan   Intervention Prescribe, educate and counsel regarding individualized specific dietary modifications aiming towards targeted core components such as weight, hypertension, lipid management, diabetes, heart failure and other comorbidities.    Expected Outcomes Short Term Goal: Understand basic principles of dietary content, such as calories, fat, sodium, cholesterol and nutrients.;Short Term Goal: A plan has been developed with personal nutrition goals set during dietitian appointment.;Long Term Goal: Adherence to prescribed nutrition plan.             Nutrition Assessments:  MEDIFICTS Score Key: ?70 Need to make dietary changes  40-70 Heart Healthy Diet ? 40 Therapeutic Level Cholesterol Diet  Flowsheet Row Cardiac Rehab from 01/30/2022 in [facility] Cardiac [facility]  Picture Your Plate Total Score on Admission 55  Picture Your Plate Total Score on Discharge 70      Picture Your Plate Scores: <79 Unhealthy dietary pattern with much room for improvement. 41-50 Dietary pattern unlikely to meet recommendations for good health and room for improvement. 51-60 More healthful dietary pattern, with some room for improvement.  >60 Healthy dietary pattern, although there may be some specific behaviors that could be improved.    Nutrition Goals Re-Evaluation:  Nutrition Goals Re-Evaluation     Gwendolyn King Name 12/22/21 1407 01/12/22 1414           Goals   Current Weight -- 153 lb (69.4 kg)      Nutrition Goal ST: make sure every meal has some vegetables - even when her husband wont eat them  LT: limit saturated fat <12g/day, limit Na <2g/day, eat at least 8 fruit/vegetable servings per day Eat more vegetables and Reduce  sweets intake.      Comment She reports her diet has been fair. She is still eating mostly what her husband eats. She is practicing MyPlate structure - she reports not being as  good with the colors on her plate. She would like to focus on keeping the "right foods" in her meals - sometimes she will give her husband what he wants and then she will have a salad. Discussed frozen vegetables as an easy side if she wants to include a larger variety of vegetables easily. Gwendolyn King states that sweets are part of her vices.She is going to try to make more vegetables and eat them since her husband will not eat them.      Expected Outcome ST: make sure every meal has some vegetables - even when her husband wont eat them  LT: limit saturated fat <12g/day, limit Na <2g/day, eat at least 8 fruit/vegetable servings per day Short: eat more vegetables and reduce sweets. Long: adhere to a diet that pertains to her.               Nutrition Goals Discharge (Final Nutrition Goals Re-Evaluation):  Nutrition Goals Re-Evaluation - 01/12/22 1414       Goals   Current Weight 153 lb (69.4 kg)    Nutrition Goal Eat more vegetables and Reduce sweets intake.    Comment Gwendolyn King states that sweets are part of her vices.She is going to try to make more vegetables and eat them since her husband will not eat them.    Expected Outcome Short: eat more vegetables and reduce sweets. Long: adhere to a diet that pertains to her.             Psychosocial: Target Goals: Acknowledge presence King absence of significant depression and/King stress, maximize coping skills, provide positive support system. Participant is able to verbalize types and ability to use techniques and skills needed for reducing stress and depression.   Education: Stress, Anxiety, and Depression - Group verbal and visual presentation to define topics covered.  Reviews how body is impacted by stress, anxiety, and depression.  Also discusses healthy ways to reduce  stress and to treat/manage anxiety and depression.  Written material given at graduation. Flowsheet Row Cardiac Rehab from 01/18/2022 in [facility] Cardiac and Pulmonary Rehab  Date 12/07/21  Educator [facility]  Instruction Review Code 1- [employer] Understanding       Education: Sleep Hygiene -Provides group verbal and written instruction about how sleep can affect your health.  Define sleep hygiene, discuss sleep cycles and impact of sleep habits. Review good sleep hygiene tips.    Initial Review & Psychosocial Screening:  Initial Psych Review & Screening - 11/10/21 1009       Initial Review   Current issues with None Identified;Current Stress Concerns    Comments Daphna thinks her husband has dementia and is on of her stressors.      Family Dynamics   Good Support System? Yes    Concerns Recent loss of significant other    Comments Patient has three children andher husband she can look to for support.      Barriers   Psychosocial barriers to participate in program The patient should benefit from training in stress management and relaxation.;There are no identifiable barriers King psychosocial needs.      Screening Interventions   Interventions Encouraged to exercise;To provide support and resources with identified psychosocial needs;Provide feedback about the scores to participant    Expected Outcomes Short Term goal: Utilizing psychosocial counselor, staff and physician to assist with identification of specific Stressors King current issues interfering with healing process. Setting desired goal for each stressor King current issue identified.;Long Term Goal: Stressors King current issues are  controlled King eliminated.;Short Term goal: Identification and review with participant of any Quality of Life King Depression concerns found by scoring the questionnaire.;Long Term goal: The participant improves quality of Life and PHQ9 Scores as seen by post scores and/King verbalization of changes              Quality of Life Scores:   Quality of Life - 01/30/22 1447       Quality of Life Scores   Health/Function Pre 23.54 %    Health/Function Post 20.89 %    Health/Function % Change -11.26 %    Socioeconomic Pre 27.36 %    Socioeconomic Post 25.64 %    Socioeconomic % Change  -6.29 %    Psych/Spiritual Pre 25 %    Psych/Spiritual Post 24.21 %    Psych/Spiritual % Change -3.16 %    Family Pre 21.6 %    Family Post 18.8 %    Family % Change -12.96 %    GLOBAL Pre 24.37 %    GLOBAL Post 22.29 %    GLOBAL % Change -8.54 %            Scores of 19 and below usually indicate a poorer quality of life in these areas.  A difference of  2-3 points is a clinically meaningful difference.  A difference of 2-3 points in the total score of the Quality of Life Index has been associated with significant improvement in overall quality of life, self-image, physical symptoms, and general health in studies assessing change in quality of life.  PHQ-9: Review Flowsheet  More data exists      01/30/2022 11/23/2021 08/31/2021 05/04/2021 05/18/2020  Depression screen PHQ 2/9  Decreased Interest 0 0 0 0 0  Down, Depressed, Hopeless 0 0 0 0 0  PHQ - 2 Score 0 0 0 0 0  Altered sleeping 1 0 - - -  Tired, decreased energy 0 0 - - -  Change in appetite 0 0 - - -  Feeling bad King failure about yourself  1 0 - - -  Trouble concentrating 0 0 - - -  Moving slowly King fidgety/restless 2 0 - - -  Suicidal thoughts 0 0 - - -  PHQ-9 Score 4 0 - - -  Difficult doing work/chores Somewhat difficult Not difficult at all - - -   Interpretation of Total Score  Total Score Depression Severity:  1-4 = Minimal depression, 5-9 = Mild depression, 10-14 = Moderate depression, 15-19 = Moderately severe depression, 20-27 = Severe depression   Psychosocial Evaluation and Intervention:  Psychosocial Evaluation - 11/10/21 1010       Psychosocial Evaluation & Interventions   Interventions Encouraged to exercise with the  program and follow exercise prescription;Relaxation education;Stress management education    Comments Adonis thinks her husband has dementia and is on of her stressors.Patient has three children andher husband she can look to for support.    Expected Outcomes Short: Start HeartTrack to help with mood. Long: Maintain a healthy mental state    Continue Psychosocial Services  Follow up required by staff             Psychosocial Re-Evaluation:  Psychosocial Re-Evaluation     [place] Name 12/22/21 1414 01/12/22 1406           Psychosocial Re-Evaluation   Current issues with Current Stress Concerns Current Stress Concerns      Comments She reports some stress due to her husbands early dementia and slowing  down. She relies her daughters for support. She likes sudoku, word searches, and she reads to help to reduce stress. She reports having a hard time getting to sleep and she feels this is due to the books she reads, but aside from that she sleeps well. Patient reports no issues with their current mental states, sleep, stress, depression King anxiety. Will follow up with patient in a few weeks for any changes. Her main stressor is dealing with her husbands early onset dementia.      Expected Outcomes ST: continue to do stress reducing activites  LT: maintain positive attitude Short: Continue to exercise regularly to support mental health and notify staff of any changes. Long: maintain mental health and well being through teaching of rehab King prescribed medications independently.      Interventions Encouraged to attend Cardiac Rehabilitation for the exercise Encouraged to attend Cardiac Rehabilitation for the exercise      Continue Psychosocial Services  Follow up required by staff Follow up required by staff        Initial Review   Source of Stress Concerns Family --               Psychosocial Discharge (Final Psychosocial Re-Evaluation):  Psychosocial Re-Evaluation - 01/12/22 1406        Psychosocial Re-Evaluation   Current issues with Current Stress Concerns    Comments Patient reports no issues with their current mental states, sleep, stress, depression King anxiety. Will follow up with patient in a few weeks for any changes. Her main stressor is dealing with her husbands early onset dementia.    Expected Outcomes Short: Continue to exercise regularly to support mental health and notify staff of any changes. Long: maintain mental health and well being through teaching of rehab King prescribed medications independently.    Interventions Encouraged to attend Cardiac Rehabilitation for the exercise    Continue Psychosocial Services  Follow up required by staff             Vocational Rehabilitation: Provide vocational rehab assistance to qualifying candidates.   Vocational Rehab Evaluation & Intervention:  Vocational Rehab - 01/30/22 1451       Discharge Vocational Rehab   Discharge Vocational Rehabilitation retired             Education: Education Goals: Education classes will be provided on a variety of topics geared toward better understanding of heart health and risk factor modification. Participant will state understanding/return demonstration of topics presented as noted by education test scores.  Learning Barriers/Preferences:  Learning Barriers/Preferences - 11/10/21 1008       Learning Barriers/Preferences   Learning Barriers None    Learning Preferences None             General Cardiac Education Topics:  AED/CPR: - Group verbal and written instruction with the use of models to demonstrate the basic use of the AED with the basic ABC's of resuscitation.   Anatomy and Cardiac Procedures: - Group verbal and visual presentation and models provide information about basic cardiac anatomy and function. Reviews the testing methods done to diagnose heart disease and the outcomes of the test results. Describes the treatment choices: Medical Management,  Angioplasty, King Coronary Bypass Surgery for treating various heart conditions including Myocardial Infarction, Angina, Valve Disease, and Cardiac Arrhythmias.  Written material given at graduation. Flowsheet Row Cardiac Rehab from 01/18/2022 in [facility] Cardiac and Pulmonary Rehab  Date 12/28/21  Educator SB  Instruction Review Code 1- Verbalizes Understanding  Medication Safety: - Group verbal and visual instruction to review commonly prescribed medications for heart and lung disease. Reviews the medication, class of the drug, and side effects. Includes the steps to properly store meds and maintain the prescription regimen.  Written material given at graduation. Flowsheet Row Cardiac Rehab from 01/18/2022 in [facility] Cardiac and Pulmonary Rehab  Date 01/18/22  Educator KB  Instruction Review Code 1- Verbalizes Understanding       Intimacy: - Group verbal instruction through game format to discuss how heart and lung disease can affect sexual intimacy. Written material given at graduation..   Know Your Numbers and Heart Failure: - Group verbal and visual instruction to discuss disease risk factors for cardiac and pulmonary disease and treatment options.  Reviews associated critical values for Overweight/Obesity, Hypertension, Cholesterol, and Diabetes.  Discusses basics of heart failure: signs/symptoms and treatments.  Introduces Heart Failure Zone chart for action plan for heart failure.  Written material given at graduation. Flowsheet Row Cardiac Rehab from 01/18/2022 in [facility] Cardiac and Pulmonary Rehab  Date 11/23/21  Educator [facility]  Instruction Review Code 1- Verbalizes Understanding       Infection Prevention: - Provides verbal and written material to individual with discussion of infection control including proper hand washing and proper equipment cleaning during exercise session. Flowsheet Row Cardiac Rehab from 01/18/2022 in [facility] Cardiac and Pulmonary Rehab  Date 11/10/21  Educator  [facility]  Instruction Review Code 1- Verbalizes Understanding       Falls Prevention: - Provides verbal and written material to individual with discussion of falls prevention and safety. Flowsheet Row Cardiac Rehab from 01/18/2022 in [facility] Cardiac and Pulmonary Rehab  Date 11/10/21  Educator [facility]  Instruction Review Code 1- Verbalizes Understanding       Other: -Provides group and verbal instruction on various topics (see comments)   Knowledge Questionnaire Score:  Knowledge Questionnaire Score - 01/30/22 1451       Knowledge Questionnaire Score   Pre Score 23/26: Angina, Nutrition    Post Score 26/26             Core Components/Risk Factors/Patient Goals at Admission:  Personal Goals and Risk Factors at Admission - 11/17/21 1718       Core Components/Risk Factors/Patient Goals on Admission    Weight Management Yes;Weight Loss    Intervention Weight Management: Develop a combined nutrition and exercise program designed to reach desired caloric intake, while maintaining appropriate intake of nutrient and fiber, sodium and fats, and appropriate energy expenditure required for the weight goal.;Weight Management: Provide education and appropriate resources to help participant work on and attain dietary goals.;Weight Management/Obesity: Establish reasonable short term and long term weight goals.;Obesity: Provide education and appropriate resources to help participant work on and attain dietary goals.    Admit Weight 156 lb (70.8 kg)    Goal Weight: Short Term 152 lb (68.9 kg)    Goal Weight: Long Term 146 lb (66.2 kg)    Expected Outcomes Short Term: Continue to assess and modify interventions until short term weight is achieved;Long Term: Adherence to nutrition and physical activity/exercise program aimed toward attainment of established weight goal;Weight Loss: Understanding of general recommendations for a balanced deficit meal plan, which promotes 1-2 lb weight loss per week and  includes a negative energy balance of [phone] kcal/d;Understanding recommendations for meals to include 15-35% energy as protein, 25-35% energy from fat, 35-60% energy from carbohydrates, less than $RemoveB'200mg'qdkncuzO$  of dietary cholesterol, 20-35 gm of total fiber daily;Understanding of distribution of  calorie intake throughout the day with the consumption of 4-5 meals/snacks    Hypertension Yes    Intervention Provide education on lifestyle modifcations including regular physical activity/exercise, weight management, moderate sodium restriction and increased consumption of fresh fruit, vegetables, and low fat dairy, alcohol moderation, and smoking cessation.;Monitor prescription use compliance.    Expected Outcomes Short Term: Continued assessment and intervention until BP is < 140/2mm HG in hypertensive participants. < 130/22mm HG in hypertensive participants with diabetes, heart failure King chronic kidney disease.;Long Term: Maintenance of blood pressure at goal levels.    Lipids Yes    Intervention Provide education and support for participant on nutrition & aerobic/resistive exercise along with prescribed medications to achieve LDL '70mg'$ , HDL >$Remo'40mg'qmdsu$ .    Expected Outcomes Short Term: Participant states understanding of desired cholesterol values and is compliant with medications prescribed. Participant is following exercise prescription and nutrition guidelines.;Long Term: Cholesterol controlled with medications as prescribed, with individualized exercise RX and with personalized nutrition plan. Value goals: LDL < $Rem'70mg'DYNX$ , HDL > 40 mg.             Education:Diabetes - Individual verbal and written instruction to review signs/symptoms of diabetes, desired ranges of glucose level fasting, after meals and with exercise. Acknowledge that pre and post exercise glucose checks will be done for 3 sessions at entry of program.   Core Components/Risk Factors/Patient Goals Review:   Goals and Risk Factor Review      Row Name 12/22/21 1410 01/12/22 1409           Core Components/Risk Factors/Patient Goals Review   Personal Goals Review Hypertension;Lipids;Weight Management/Obesity Weight Management/Obesity;Hypertension      Review She will check her BP at home every once in a while - encouraged her to take her BP when she is not at rehab. She reports that since starting rehab she has lost 5 lbs. She takes her medications as prescribed with no issues. She continues to work on diet and exercise. Gwendolyn King has been checking her blood pressure at home. She states that her readings have been good at home. Her readings have been doing well and withun normal limits.She would be interested in lseing. She wants to reach a weigth goal of 140lbs.      Expected Outcomes ST: check BP when not at rehab LT: continue to monitor risk factors Short: lose 5 pounds in the next couple of weeks. Long: Reach weight goal of 140lbs.               Core Components/Risk Factors/Patient Goals at Discharge (Final Review):   Goals and Risk Factor Review - 01/12/22 1409       Core Components/Risk Factors/Patient Goals Review   Personal Goals Review Weight Management/Obesity;Hypertension    Review Gwendolyn King has been checking her blood pressure at home. She states that her readings have been good at home. Her readings have been doing well and withun normal limits.She would be interested in lseing. She wants to reach a weigth goal of 140lbs.    Expected Outcomes Short: lose 5 pounds in the next couple of weeks. Long: Reach weight goal of 140lbs.             ITP Comments:  ITP Comments     Row Name 11/10/21 1006 11/17/21 1652 11/23/21 1428 12/06/21 1343 12/07/21 1330   ITP Comments Virtual Visit completed. Patient informed on EP and RD appointment and 6 Minute walk test. Patient also informed of patient health questionnaires on My Chart. Patient [employer]  understanding. Visit diagnosis can be found in [facility] 10/24/2021. Completed 6MWT and  gym orientation. Initial ITP created and sent for review to Dr. Emily Filbert, Medical Director. First full day of exercise!  Patient was oriented to gym and equipment including functions, settings, policies, and procedures.  Patient's individual exercise prescription and treatment plan were reviewed.  All starting workloads were established based on the results of the 6 minute walk test done at initial orientation visit.  The plan for exercise progression was also introduced and progression will be customized based on patient's performance and goals. Completed initial RD consultation 30 Day review completed. Medical Director ITP review done, changes made as directed, and signed approval by Medical Director.    [place] Name 01/04/22 0840 02/01/22 1235         ITP Comments 30 Day review completed. Medical Director ITP review done, changes made as directed, and signed approval by Medical Director. 30 Day review completed. Medical Director ITP review done, changes made as directed, and signed approval by Medical Director.               Comments:

## 2022-02-06 ENCOUNTER — Encounter: Payer: Self-pay | Admitting: Family Medicine

## 2022-02-07 DIAGNOSIS — J3081 Allergic rhinitis due to animal (cat) (dog) hair and dander: Secondary | ICD-10-CM | POA: Diagnosis not present

## 2022-02-07 DIAGNOSIS — H2513 Age-related nuclear cataract, bilateral: Secondary | ICD-10-CM | POA: Diagnosis not present

## 2022-02-07 DIAGNOSIS — J3089 Other allergic rhinitis: Secondary | ICD-10-CM | POA: Diagnosis not present

## 2022-02-07 DIAGNOSIS — J301 Allergic rhinitis due to pollen: Secondary | ICD-10-CM | POA: Diagnosis not present

## 2022-02-08 ENCOUNTER — Ambulatory Visit: Payer: PPO | Admitting: Family Medicine

## 2022-02-14 DIAGNOSIS — J3089 Other allergic rhinitis: Secondary | ICD-10-CM | POA: Diagnosis not present

## 2022-02-14 DIAGNOSIS — J301 Allergic rhinitis due to pollen: Secondary | ICD-10-CM | POA: Diagnosis not present

## 2022-02-14 DIAGNOSIS — J3081 Allergic rhinitis due to animal (cat) (dog) hair and dander: Secondary | ICD-10-CM | POA: Diagnosis not present

## 2022-02-23 DIAGNOSIS — H2513 Age-related nuclear cataract, bilateral: Secondary | ICD-10-CM | POA: Diagnosis not present

## 2022-02-23 DIAGNOSIS — H43813 Vitreous degeneration, bilateral: Secondary | ICD-10-CM | POA: Diagnosis not present

## 2022-02-24 DIAGNOSIS — J3081 Allergic rhinitis due to animal (cat) (dog) hair and dander: Secondary | ICD-10-CM | POA: Diagnosis not present

## 2022-02-24 DIAGNOSIS — J301 Allergic rhinitis due to pollen: Secondary | ICD-10-CM | POA: Diagnosis not present

## 2022-02-24 DIAGNOSIS — J3089 Other allergic rhinitis: Secondary | ICD-10-CM | POA: Diagnosis not present

## 2022-03-02 DIAGNOSIS — J301 Allergic rhinitis due to pollen: Secondary | ICD-10-CM | POA: Diagnosis not present

## 2022-03-02 DIAGNOSIS — J3089 Other allergic rhinitis: Secondary | ICD-10-CM | POA: Diagnosis not present

## 2022-03-02 DIAGNOSIS — J3081 Allergic rhinitis due to animal (cat) (dog) hair and dander: Secondary | ICD-10-CM | POA: Diagnosis not present

## 2022-03-08 ENCOUNTER — Encounter: Payer: Self-pay | Admitting: Family Medicine

## 2022-03-08 ENCOUNTER — Ambulatory Visit (INDEPENDENT_AMBULATORY_CARE_PROVIDER_SITE_OTHER): Payer: PPO | Admitting: Family Medicine

## 2022-03-08 DIAGNOSIS — Z8673 Personal history of transient ischemic attack (TIA), and cerebral infarction without residual deficits: Secondary | ICD-10-CM | POA: Diagnosis not present

## 2022-03-08 DIAGNOSIS — I255 Ischemic cardiomyopathy: Secondary | ICD-10-CM

## 2022-03-08 DIAGNOSIS — G479 Sleep disorder, unspecified: Secondary | ICD-10-CM | POA: Insufficient documentation

## 2022-03-08 DIAGNOSIS — H2511 Age-related nuclear cataract, right eye: Secondary | ICD-10-CM | POA: Diagnosis not present

## 2022-03-08 DIAGNOSIS — M25511 Pain in right shoulder: Secondary | ICD-10-CM | POA: Insufficient documentation

## 2022-03-08 DIAGNOSIS — I639 Cerebral infarction, unspecified: Secondary | ICD-10-CM

## 2022-03-08 NOTE — Assessment & Plan Note (Signed)
Asymptomatic.  She will continue on Entresto 1 tablet twice daily and carvedilol 6.25 mg twice daily.

## 2022-03-08 NOTE — Patient Instructions (Addendum)
Nice to see you. Please try the shoulder exercises several days a week.  If they make your pain worse please discontinue them and let us know.  If you would like to do physical therapy or see sports medicine in the future please let me know. Please pick a bedtime and stick with it.  Please develop a bedtime routine as well.  Shoulder Impingement Syndrome Rehab Ask your health care provider which exercises are safe for you. Do exercises exactly as told by your health care provider and adjust them as directed. It is normal to feel mild stretching, pulling, tightness, or discomfort as you do these exercises. Stop right away if you feel sudden pain or your pain gets worse. Do not begin these exercises until told by your health care provider. Stretching and range-of-motion exercise This exercise warms up your muscles and joints and improves the movement and flexibility of your shoulder. This exercise also helps to relieve pain and stiffness. Passive horizontal adduction In passive adduction, you use your other hand to move the injured arm toward your body. The injured arm does not move on its own. In this movement, your arm is moved across your body in the horizontal plane (horizontal adduction). Sit or stand and pull your left / right elbow across your chest, toward your other shoulder. Stop when you feel a gentle stretch in the back of your shoulder and upper arm. Keep your arm at shoulder height. Keep your arm as close to your body as you comfortably can. Hold for __________ seconds. Slowly return to the starting position. Repeat __________ times. Complete this exercise __________ times a day. Strengthening exercises These exercises build strength and endurance in your shoulder. Endurance is the ability to use your muscles for a long time, even after they get tired. External rotation, isometric This is an exercise in which you press the back of your wrist against a door frame without moving your  shoulder joint (isometric). Stand or sit in a doorway, facing the door frame. Bend your left / right elbow and place the back of your wrist against the door frame. Only the back of your wrist should be touching the frame. Keep your upper arm at your side. Gently press your wrist against the door frame, as if you are trying to push your arm away from your abdomen (external rotation). Press as hard as you are able without pain. Avoid shrugging your shoulder while you press your wrist against the door frame. Keep your shoulder blade tucked down toward the middle of your back. Hold for __________ seconds. Slowly release the tension, and relax your muscles completely before you repeat the exercise. Repeat __________ times. Complete this exercise __________ times a day. Internal rotation, isometric This is an exercise in which you press your palm against a door frame without moving your shoulder joint (isometric). Stand or sit in a doorway, facing the door frame. Bend your left / right elbow and place the palm of your hand against the door frame. Only your palm should be touching the frame. Keep your upper arm at your side. Gently press your hand against the door frame, as if you are trying to push your arm toward your abdomen (internal rotation). Press as hard as you are able without pain. Avoid shrugging your shoulder while you press your hand against the door frame. Keep your shoulder blade tucked down toward the middle of your back. Hold for __________ seconds. Slowly release the tension, and relax your muscles completely before  you repeat the exercise. Repeat __________ times. Complete this exercise __________ times a day. Scapular protraction, supine  Lie on your back on a firm surface (supine position). Hold a __________ weight in your left / right hand. Raise your left / right arm straight into the air so your hand is directly above your shoulder joint. Push the weight into the air so your  shoulder (scapula) lifts off the surface that you are lying on. The scapula will push up or forward (protraction). Do not move your head, neck, or back. Hold for __________ seconds. Slowly return to the starting position. Let your muscles relax completely before you repeat this exercise. Repeat __________ times. Complete this exercise __________ times a day. Scapular retraction  Sit in a stable chair without armrests, or stand up. Secure an exercise band to a stable object in front of you so the band is at shoulder height. Hold one end of the exercise band in each hand. Your palms should face down. Squeeze your shoulder blades together (retraction) and move your elbows slightly behind you. Do not shrug your shoulders upward while you do this. Hold for __________ seconds. Slowly return to the starting position. Repeat __________ times. Complete this exercise __________ times a day. Shoulder extension  Sit in a stable chair without armrests, or stand up. Secure an exercise band to a stable object in front of you so the band is above shoulder height. Hold one end of the exercise band in each hand. Straighten your elbows and lift your hands up to shoulder height. Squeeze your shoulder blades together and pull your hands down to the sides of your thighs (extension). Stop when your hands are straight down by your sides. Do not let your hands go behind your body. Hold for __________ seconds. Slowly return to the starting position. Repeat __________ times. Complete this exercise __________ times a day. This information is not intended to replace advice given to you by your health care provider. Make sure you discuss any questions you have with your health care provider. Document Revised: 11/29/2018 Document Reviewed: 09/02/2018 Elsevier Patient Education  [place].

## 2022-03-08 NOTE — Assessment & Plan Note (Signed)
History of this in the recent past.  She will remain on Plavix 75 mg daily and continue with adequate cholesterol control.

## 2022-03-08 NOTE — Progress Notes (Signed)
Tommi Rumps, MD Phone: [phone]  Gwendolyn King is a 81 y.o. female who presents today for follow-up.  Ischemic cardiomyopathy: Patient is on Entresto and carvedilol.  No chest pain, shortness of breath, orthopnea, or PND.  She had minimal swelling yesterday though this resolved today.  History of CVA: Patient remains on Plavix and Repatha.  No numbness, weakness, or vision changes.  Sleeping difficulty: Patient notes having trouble falling asleep 2 nights a week.  She does not have a set bedtime and goes to bed anywhere between 12 AM and 2 AM.  She typically gets up between 8 and 9 AM.  She typically is reading a book when she is up that late.  No alcohol intake.  No caffeine later in the day.  Right arm pain: Patient reports right upper arm pain has been going on for several weeks.  It has started to improve recently.  She notes it hurts if she reaches out for the toilet paper.  She has not tried any medications for this.  Social History   Tobacco Use  Smoking Status Never  Smokeless Tobacco Never    Current Outpatient Medications on File Prior to Visit  Medication Sig Dispense Refill   acetaminophen (TYLENOL) 500 MG tablet Take 1,000 mg by mouth at bedtime.     aspirin EC 81 MG tablet Take 81 mg by mouth at bedtime.      AZELASTINE HCL OP Place 1 drop into both eyes 3 (three) times daily as needed (dry/irritated eyes.).     Biotin 5 MG TABS Take 5 mg by mouth daily.     calcium-vitamin D (OSCAL WITH D) 500-200 MG-UNIT tablet Take 1 tablet by mouth daily.      cholecalciferol (VITAMIN D) 25 MCG (1000 UNIT) tablet Take 1,000 Units by mouth every evening.     clopidogrel (PLAVIX) 75 MG tablet Take 75 mg by mouth daily.     EPINEPHrine 0.3 mg/0.3 mL IJ SOAJ injection Inject 0.3 mg into the muscle as needed for anaphylaxis.     Evolocumab (REPATHA) 140 MG/ML SOSY Inject 140 mg into the skin every 14 (fourteen) days. 6 mL 1   Krill Oil 500 MG CAPS Take 500 mg by mouth daily.      letrozole (FEMARA) 2.5 MG tablet Take 1 tablet (2.5 mg total) by mouth daily. 90 tablet 1   levocetirizine (XYZAL) 5 MG tablet Take 5 mg by mouth every evening.   3   LEVOXYL 75 MCG tablet Take 1 tablet by mouth once daily 90 tablet 2   Multiple Vitamin (MULTIVITAMIN WITH MINERALS) TABS tablet Take 1 tablet by mouth daily. Adult 50+     polyethylene glycol powder (GLYCOLAX/MIRALAX) powder Take 17 g by mouth daily. With coffee     sacubitril-valsartan (ENTRESTO) 24-26 MG Take 1 tablet by mouth 2 (two) times daily. 180 tablet 3   vitamin B-12 (CYANOCOBALAMIN) 1000 MCG tablet Take 1,000 mcg by mouth daily.     carvedilol (COREG) 6.25 MG tablet Take 1 tablet (6.25 mg total) by mouth 2 (two) times daily. 180 tablet 0   No current facility-administered medications on file prior to visit.     ROS see history of present illness  Objective  Physical Exam Vitals:   03/08/22 1448  BP: 118/80  Pulse: 62  Temp: 98.1 F (36.7 C)  SpO2: 97%    BP Readings from Last 3 Encounters:  03/08/22 118/80  01/05/22 (!) 115/49  12/23/21 140/62   Wt Readings from  Last 3 Encounters:  03/08/22 151 lb 12.8 oz (68.9 kg)  01/18/22 152 lb 1.6 oz (69 kg)  01/05/22 154 lb 8 oz (70.1 kg)    Physical Exam Constitutional:      General: She is not in acute distress.    Appearance: She is not diaphoretic.  Cardiovascular:     Rate and Rhythm: Normal rate and regular rhythm.     Heart sounds: Normal heart sounds.  Pulmonary:     Effort: Pulmonary effort is normal.     Breath sounds: Normal breath sounds.  Musculoskeletal:     Comments: Right shoulder with no tenderness, warmth, or swelling, she has full active and passive range of motion of the right shoulder, full active range of motion of the left shoulder, there is discomfort on active and passive external rotation and abduction of the right shoulder, positive empty can and speeds on the right,, negative empty can and speeds on the left  Skin:     General: Skin is warm and dry.  Neurological:     Mental Status: She is alert.      Assessment/Plan: Please see individual problem list.  Problem List Items Addressed This Visit     Ischemic cardiomyopathy (Chronic)    Asymptomatic.  She will continue on Entresto 1 tablet twice daily and carvedilol 6.25 mg twice daily.      CVA (cerebral vascular accident) ([place])    History of this in the recent past.  She will remain on Plavix 75 mg daily and continue with adequate cholesterol control.      Right shoulder pain    I suspect this is related to a combination of her rotator cuff tendinitis and biceps tendinitis given her exam.  Given that she has been improving I discussed the option of continuing to monitor to see if this resolves.  Discussed potential for PT or seeing sports medicine to consider injections.  I will give her exercises to complete at home.  If these cause more discomfort she will discontinue them.  If this is not improving over the next several weeks she will let me know so we can get her referred for PT or sports medicine.      Sleeping difficulty    I suspect this is related to poor sleep hygiene surrounding her bedtime.  Discussed sticking with a consistent bedtime and developing a bedtime routine.        Return in about 3 months (around 06/08/2022).   Tommi Rumps, MD [facility]

## 2022-03-08 NOTE — Assessment & Plan Note (Signed)
I suspect this is related to poor sleep hygiene surrounding her bedtime.  Discussed sticking with a consistent bedtime and developing a bedtime routine.

## 2022-03-08 NOTE — Assessment & Plan Note (Signed)
I suspect this is related to a combination of her rotator cuff tendinitis and biceps tendinitis given her exam.  Given that she has been improving I discussed the option of continuing to monitor to see if this resolves.  Discussed potential for PT or seeing sports medicine to consider injections.  I will give her exercises to complete at home.  If these cause more discomfort she will discontinue them.  If this is not improving over the next several weeks she will let me know so we can get her referred for PT or sports medicine.

## 2022-03-09 DIAGNOSIS — J3081 Allergic rhinitis due to animal (cat) (dog) hair and dander: Secondary | ICD-10-CM | POA: Diagnosis not present

## 2022-03-09 DIAGNOSIS — J301 Allergic rhinitis due to pollen: Secondary | ICD-10-CM | POA: Diagnosis not present

## 2022-03-09 DIAGNOSIS — J3089 Other allergic rhinitis: Secondary | ICD-10-CM | POA: Diagnosis not present

## 2022-03-16 NOTE — Anesthesia Preprocedure Evaluation (Addendum)
Anesthesia Evaluation  Patient identified by MRN, date of birth, ID band Patient awake    Reviewed: Allergy & Precautions, NPO status , Patient's Chart, lab work & pertinent test results  History of Anesthesia Complications Negative for: history of anesthetic complications  Airway Mallampati: II   Neck ROM: Full    Dental no notable dental hx.    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 foot numbness)    GI/Hepatic negative GI ROS,   Endo/Other  Hypothyroidism   Renal/GU Renal disease (stage III CKD)     Musculoskeletal  (+) Arthritis ,   Abdominal   Peds  Hematology Breast CA   Anesthesia Other Findings   Reproductive/Obstetrics                            Anesthesia Physical Anesthesia Plan  ASA: 3  Anesthesia Plan: MAC   Post-op Pain Management:    Induction: Intravenous  PONV Risk Score and Plan: 2 and TIVA, Midazolam and Treatment may vary due to age or medical condition  Airway Management Planned: Natural Airway  Additional Equipment:   Intra-op Plan:   Post-operative Plan:   Informed Consent: I have reviewed the patients History and Physical, chart, labs and discussed the procedure including the risks, benefits and alternatives for the proposed anesthesia with the patient or authorized representative who has indicated his/her understanding and acceptance.       Plan Discussed with: CRNA  Anesthesia Plan Comments: (LMA/GETA backup discussed.  Patient consented for risks of anesthesia including but not limited to:  - adverse reactions to medications - damage to eyes, teeth, lips or other oral mucosa - nerve damage due to positioning  -  sore throat or hoarseness - damage to heart, brain, nerves, lungs, other parts of body or loss of life  Informed patient about role of CRNA in peri- and intra-operative care.  Patient voiced understanding.)       Anesthesia Quick Evaluation

## 2022-03-17 DIAGNOSIS — J3089 Other allergic rhinitis: Secondary | ICD-10-CM | POA: Diagnosis not present

## 2022-03-17 DIAGNOSIS — J3081 Allergic rhinitis due to animal (cat) (dog) hair and dander: Secondary | ICD-10-CM | POA: Diagnosis not present

## 2022-03-17 DIAGNOSIS — J301 Allergic rhinitis due to pollen: Secondary | ICD-10-CM | POA: Diagnosis not present

## 2022-03-20 ENCOUNTER — Encounter: Payer: Self-pay | Admitting: Ophthalmology

## 2022-03-22 NOTE — Discharge Instructions (Signed)

## 2022-03-23 DIAGNOSIS — J3089 Other allergic rhinitis: Secondary | ICD-10-CM | POA: Diagnosis not present

## 2022-03-23 DIAGNOSIS — J301 Allergic rhinitis due to pollen: Secondary | ICD-10-CM | POA: Diagnosis not present

## 2022-03-23 DIAGNOSIS — J3081 Allergic rhinitis due to animal (cat) (dog) hair and dander: Secondary | ICD-10-CM | POA: Diagnosis not present

## 2022-03-27 ENCOUNTER — Ambulatory Visit (AMBULATORY_SURGERY_CENTER): Payer: PPO | Admitting: Anesthesiology

## 2022-03-27 ENCOUNTER — Encounter
Admission: RE | Disposition: A | Discharge status: Home / Self Care | Payer: Self-pay | Source: Home / Self Care | Attending: Ophthalmology

## 2022-03-27 ENCOUNTER — Other Ambulatory Visit: Payer: Self-pay

## 2022-03-27 ENCOUNTER — Ambulatory Visit: Payer: PPO | Admitting: Anesthesiology

## 2022-03-27 ENCOUNTER — Ambulatory Visit
Admission: RE | Admit: 2022-03-27 | Discharge: 2022-03-27 | Disposition: A | Discharge status: Home / Self Care | Payer: PPO | Attending: Ophthalmology | Admitting: Ophthalmology

## 2022-03-27 ENCOUNTER — Encounter: Payer: Self-pay | Admitting: Ophthalmology

## 2022-03-27 DIAGNOSIS — I130 Hypertensive heart and chronic kidney disease with heart failure and stage 1 through stage 4 chronic kidney disease, or unspecified chronic kidney disease: Secondary | ICD-10-CM | POA: Diagnosis not present

## 2022-03-27 DIAGNOSIS — Z8673 Personal history of transient ischemic attack (TIA), and cerebral infarction without residual deficits: Secondary | ICD-10-CM | POA: Insufficient documentation

## 2022-03-27 DIAGNOSIS — I255 Ischemic cardiomyopathy: Secondary | ICD-10-CM | POA: Insufficient documentation

## 2022-03-27 DIAGNOSIS — Z8249 Family history of ischemic heart disease and other diseases of the circulatory system: Secondary | ICD-10-CM | POA: Insufficient documentation

## 2022-03-27 DIAGNOSIS — M1990 Unspecified osteoarthritis, unspecified site: Secondary | ICD-10-CM | POA: Diagnosis not present

## 2022-03-27 DIAGNOSIS — H2511 Age-related nuclear cataract, right eye: Secondary | ICD-10-CM

## 2022-03-27 DIAGNOSIS — I2510 Atherosclerotic heart disease of native coronary artery without angina pectoris: Secondary | ICD-10-CM | POA: Diagnosis not present

## 2022-03-27 DIAGNOSIS — E039 Hypothyroidism, unspecified: Secondary | ICD-10-CM | POA: Diagnosis not present

## 2022-03-27 DIAGNOSIS — Z8261 Family history of arthritis: Secondary | ICD-10-CM | POA: Diagnosis not present

## 2022-03-27 DIAGNOSIS — Z7902 Long term (current) use of antithrombotics/antiplatelets: Secondary | ICD-10-CM | POA: Insufficient documentation

## 2022-03-27 DIAGNOSIS — I5022 Chronic systolic (congestive) heart failure: Secondary | ICD-10-CM | POA: Insufficient documentation

## 2022-03-27 DIAGNOSIS — N183 Chronic kidney disease, stage 3 unspecified: Secondary | ICD-10-CM | POA: Insufficient documentation

## 2022-03-27 DIAGNOSIS — Z853 Personal history of malignant neoplasm of breast: Secondary | ICD-10-CM | POA: Diagnosis not present

## 2022-03-27 DIAGNOSIS — J45909 Unspecified asthma, uncomplicated: Secondary | ICD-10-CM | POA: Insufficient documentation

## 2022-03-27 DIAGNOSIS — Z955 Presence of coronary angioplasty implant and graft: Secondary | ICD-10-CM | POA: Diagnosis not present

## 2022-03-27 DIAGNOSIS — I509 Heart failure, unspecified: Secondary | ICD-10-CM

## 2022-03-27 DIAGNOSIS — Z823 Family history of stroke: Secondary | ICD-10-CM | POA: Insufficient documentation

## 2022-03-27 HISTORY — PX: CATARACT EXTRACTION W/PHACO: SHX586

## 2022-03-27 HISTORY — DX: Presence of external hearing-aid: Z97.4

## 2022-03-27 SURGERY — CATARACT EXTRACTION PHACO AND INTRAOCULAR LENS PLACEMENT (IOC)
Anesthesia: Monitor Anesthesia Care | Site: Eye | Laterality: Right

## 2022-03-27 MED ORDER — SIGHTPATH DOSE#1 NA HYALUR & NA CHOND-NA HYALUR IO KIT
PACK | INTRAOCULAR | Status: DC | PRN
  Administered 2022-03-27: 1 via OPHTHALMIC

## 2022-03-27 MED ORDER — SIGHTPATH DOSE#1 BSS IO SOLN
INTRAOCULAR | Status: DC | PRN
  Administered 2022-03-27: 93 mL via OPHTHALMIC

## 2022-03-27 MED ORDER — MIDAZOLAM HCL 2 MG/2ML IJ SOLN
INTRAMUSCULAR | Status: DC | PRN
  Administered 2022-03-27: 1 mg via INTRAVENOUS

## 2022-03-27 MED ORDER — FENTANYL CITRATE (PF) 100 MCG/2ML IJ SOLN
INTRAMUSCULAR | Status: DC | PRN
  Administered 2022-03-27 (×2): 50 ug via INTRAVENOUS

## 2022-03-27 MED ORDER — ACETAMINOPHEN 160 MG/5ML PO SOLN: 325.0000 mg | ORAL | Status: DC | PRN

## 2022-03-27 MED ORDER — LACTATED RINGERS IV SOLN: INTRAVENOUS | Status: DC

## 2022-03-27 MED ORDER — ACETAMINOPHEN 325 MG PO TABS: 650.0000 mg | ORAL_TABLET | Freq: Once | ORAL | Status: DC | PRN

## 2022-03-27 MED ORDER — SIGHTPATH DOSE#1 BSS IO SOLN
INTRAOCULAR | Status: DC | PRN
  Administered 2022-03-27: 15 mL

## 2022-03-27 MED ORDER — TETRACAINE HCL 0.5 % OP SOLN
1.0000 [drp] | OPHTHALMIC | Status: DC | PRN
  Administered 2022-03-27 (×3): 1 [drp] via OPHTHALMIC

## 2022-03-27 MED ORDER — LIDOCAINE HCL (PF) 2 % IJ SOLN
INTRAOCULAR | Status: DC | PRN
  Administered 2022-03-27: 1 mL via INTRAOCULAR

## 2022-03-27 MED ORDER — ONDANSETRON HCL 4 MG/2ML IJ SOLN: 4.0000 mg | Freq: Once | INTRAMUSCULAR | Status: DC | PRN

## 2022-03-27 MED ORDER — ARMC OPHTHALMIC DILATING DROPS
1.0000 | OPHTHALMIC | Status: DC | PRN
  Administered 2022-03-27 (×3): 1 via OPHTHALMIC

## 2022-03-27 MED ORDER — MOXIFLOXACIN HCL 0.5 % OP SOLN
OPHTHALMIC | Status: DC | PRN
  Administered 2022-03-27: 0.2 mL via OPHTHALMIC

## 2022-03-27 SURGICAL SUPPLY — 13 items
CATARACT SUITE SIGHTPATH (MISCELLANEOUS) ×2 IMPLANT
DISSECTOR HYDRO NUCLEUS 50X22 (MISCELLANEOUS) ×2
GLOVE SURG GAMMEX PI TX LF 7.5 (GLOVE) ×2
GLOVE SURG SYN 8.5  E (GLOVE) ×2
GLOVE SURG SYN 8.5 E (GLOVE) ×1 IMPLANT
LENS IOL ACRYSOF VIVITY 21.5 ×2 IMPLANT
LENS IOL VIVITY 015 21.5 ×1 IMPLANT
NDL FILTER BLUNT 18X1 1/2 (NEEDLE) ×1 IMPLANT
NEEDLE FILTER BLUNT 18X 1/2SAF (NEEDLE) ×1
NEEDLE FILTER BLUNT 18X1 1/2 (NEEDLE) ×1
SYR 3ML LL SCALE MARK (SYRINGE) ×2
SYR 5ML LL (SYRINGE) ×2
WATER STERILE IRR 250ML POUR (IV SOLUTION) ×2

## 2022-03-27 NOTE — H&P (Signed)
[facility]   Primary Care Physician:  Leone Haven, MD Ophthalmologist: Dr. Benay Pillow  Pre-Procedure History & Physical: HPI:  Gwendolyn King is a 81 y.o. female here for cataract surgery.   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   CAD (coronary artery disease)    a. 10/2021 NSTEMI/PCI: LM 99md, LAD 55ost, 99/ (2.5x15 Onyx Frontier DES), D2 90, LCX 80p/m (2.5x30 Onyx Frontier DES), RCA 50ost, 60p, 545mnl iFR throughout). LVEDP 20-2566m.   Cataract    Bil   Chicken pox    Chronic HFrEF (heart failure with reduced ejection fraction) [place]  a. 10/2021 Echo: EF 35-40%, mod asymm LVH w/ sev mid-apical ant, apical HK. GrI DD. Nl RV fxn.   Chronic kidney disease    stage 3   Colon polyp    Constipation    Hyperlipidemia    Hypothyroidism    Ischemic cardiomyopathy    a. 10/2021 Echo: EF 35-40%.   [place]STEMI (non-ST elevated myocardial infarction) [place]   Palpitations    over 20 yeras ago   Personal history of radiation therapy    Thyroid disease    Wears hearing aid in both ears     Past Surgical History:  Procedure Laterality Date   ABDOMINAL HYSTERECTOMY  1981   APPENDECTOMY  2012   BREAST BIOPSY Right 05/08/2017   Affirm Bx-DUCTAL CARCINOMA IN SITU (DCIS) WITH ONE FOCUS SUSPICIOUS FOR invasive   BREAST BIOPSY Right 05/17/2017   us US done at DR. Brynetts office, papillary carcinoma   BREAST BIOPSY Right 06/18/2019   Stereo bc calcs COMPATIBLE WITH PRIOR SURGICAL SITE CHANGES   BREAST LUMPECTOMY Right 06/07/2017   DUCTAL CARCINOMA IN SITU (DCIS) with microinvasion at 7:00 5 cmfn   BREAST LUMPECTOMY Right 06/07/2017   PAPILLARY LESION CONSISTENT WITH PAPILLARY CARCINOMA at retroaerolar 7:00 1.5 cmfn   BREAST LUMPECTOMY WITH  SENTINEL LYMPH NODE BIOPSY Right 06/07/2017   Wide excision of 2 foci of high-grade DCIS, microinvasion noted only on core biopsy. 2 microscopic foci lateral inferior margins. Patient elected not to proceed to reexcision.   COLONOSCOPY     COLONOSCOPY W/ POLYPECTOMY     CORONARY STENT INTERVENTION N/A 10/25/2021   Procedure: CORONARY STENT INTERVENTION;  Surgeon: EndNelva BushD;  [facility];  Service: Cardiovascular;  Laterality: N/A;   CYSTOCELE REPAIR N/A 09/11/2019   Procedure: ANTERIOR COLPORRHAPHY;  Surgeon: HarGae DryD;  Location: [facility];  Service: Gynecology;  Laterality: N/A;   FINGER FRACTURE SURGERY Right    5th   HAMMER TOE SURGERY  09/20/2017   INTRAVASCULAR PRESSURE WIRE/FFR STUDY N/A 10/25/2021   Procedure: INTRAVASCULAR PRESSURE WIRE/FFR STUDY;  Surgeon: EndNelva BushD;  [facility];  Service: Cardiovascular;  Laterality: N/A;   JOINT REPLACEMENT     right knee   LEFT HEART CATH AND CORONARY ANGIOGRAPHY N/A 10/25/2021   Procedure: LEFT HEART CATH AND CORONARY ANGIOGRAPHY;  Surgeon: EndNelva BushD;  [facility];  Service: Cardiovascular;  Laterality: N/A;   ROTATOR CUFF REPAIR Right    TONSILLECTOMY AND ADENOIDECTOMY  1963   TOTAL KNEE ARTHROPLASTY Right 10/17/2016   Procedure: TOTAL KNEE ARTHROPLASTY;  Surgeon: Melrose Nakayama, MD;  Location: [facility];  Service: Orthopedics;  Laterality: Right;   VAGINAL DELIVERY     3    Prior to Admission medications   Medication Sig Start Date End Date Taking? Authorizing Provider  acetaminophen (TYLENOL) 500 MG tablet Take 1,000 mg by mouth at bedtime.   Yes [provider]  aspirin EC 81 MG tablet Take 81 mg by mouth at bedtime.    Yes [provider]  AZELASTINE HCL OP Place 1 drop into both eyes 3 (three) times daily as needed (dry/irritated eyes.).   Yes [provider]  Biotin 5 MG TABS Take 5 mg by mouth daily.   Yes [provider]  calcium-vitamin D (OSCAL WITH D) 500-200 MG-UNIT tablet Take 1 tablet by mouth daily.    Yes [provider]  carvedilol (COREG) 6.25 MG tablet Take 1 tablet (6.25 mg total) by mouth 2 (two) times daily. 11/28/21 03/20/22 Yes Theora Gianotti, NP  cholecalciferol (VITAMIN D) 25 MCG (1000 UNIT) tablet Take 1,000 Units by mouth every evening.   Yes [provider]  clopidogrel (PLAVIX) 75 MG tablet Take 75 mg by mouth daily.   Yes [provider]  denosumab (PROLIA) 60 MG/ML SOSY injection Inject 60 mg into the skin every 6 (six) months.   Yes [provider]  EPINEPHrine 0.3 mg/0.3 mL IJ SOAJ injection Inject 0.3 mg into the muscle as needed for anaphylaxis.   Yes [provider]  Evolocumab (REPATHA) 140 MG/ML SOSY Inject 140 mg into the skin every 14 (fourteen) days. 11/15/21  Yes Leone Haven, MD  fluticasone [facility]) 50 MCG/ACT nasal spray Place into both nostrils daily as needed for allergies or rhinitis.   Yes [provider]  Javier Docker Oil 500 MG CAPS Take 500 mg by mouth daily.   Yes [provider]  letrozole (FEMARA) 2.5 MG tablet Take 1 tablet (2.5 mg total) by mouth daily. 01/27/22  Yes Lloyd Huger, MD  levocetirizine (XYZAL) 5 MG tablet Take 5 mg by mouth every evening.  05/09/18  Yes [provider]  LEVOXYL 75 MCG tablet Take 1 tablet by mouth once daily 06/14/21  Yes Leone Haven, MD  Multiple Vitamin (MULTIVITAMIN WITH MINERALS) TABS tablet Take 1 tablet by mouth daily. Adult 50+   Yes [provider]  polyethylene glycol powder (GLYCOLAX/MIRALAX) powder Take 17 g by mouth daily. With coffee   Yes [provider]  sacubitril-valsartan (ENTRESTO) 24-26 MG Take 1 tablet by mouth 2 (two) times daily. 01/24/22  Yes Gollan, Kathlene November, MD  vitamin B-12 (CYANOCOBALAMIN) 1000 MCG tablet Take 1,000 mcg by mouth daily.   Yes [provider]    Allergies as of  02/27/2022 - Review Complete 01/05/2022  Allergen Reaction Noted   Singulair [montelukast] Itching 05/03/2020   Lipitor [atorvastatin] Other (See Comments) 12/29/2015   Simvastatin Other (See Comments) 12/29/2015    Family History  Problem Relation Age of Onset   Arthritis Mother    Heart disease Mother    Hypertension Mother    Arthritis Father    Heart disease Father    Hypertension Father    Heart attack Father    Stroke Maternal Grandmother    Cancer Maternal Aunt        abdominal?   Hyperlipidemia Maternal Aunt    Diabetes Paternal Aunt    Glaucoma Brother    Hyperlipidemia Other    Diabetes Other    Breast cancer  Neg Hx     Social History   Socioeconomic History   Marital status: Married    Spouse name: Not on file   Number of children: Not on file   Years of education: Not on file   Highest education level: Not on file  Occupational History   Not on file  Tobacco Use   Smoking status: Never   Smokeless tobacco: Never  Vaping Use   Vaping Use: Never used  Substance and Sexual Activity   Alcohol use: Yes    Comment: wine occ   Drug use: No   Sexual activity: Not Currently    Birth control/protection: Post-menopausal  Other Topics Concern   Not on file  Social History Narrative   Lives with husband in [place]. 3 children.      Work - retired, Pharmacist, community, Pharmacist, hospital      Diet - regular, limits sugar, eats small meals   Exercise - Jazzercise 4 days per week   Social Determinants of Health   Financial Resource Strain: Low Risk  (08/31/2021)   Overall Financial Resource Strain (CARDIA)    Difficulty of Paying Living Expenses: Not hard at all  Food Insecurity: No Food Insecurity (08/31/2021)   Hunger Vital Sign    Worried About Running Out of Food in the Last Year: Never true    Ran Out of Food in the Last Year: Never true  Transportation Needs: No Transportation Needs (08/31/2021)   PRAPARE - Hydrologist (Medical): No     Lack of Transportation (Non-Medical): No  Physical Activity: Sufficiently Active (04/18/2018)   Exercise Vital Sign    Days of Exercise per Week: 4 days    Minutes of Exercise per Session: 60 min  Stress: No Stress Concern Present (08/31/2021)   [place]    Feeling of Stress : Not at all  Social Connections: [place] (08/31/2021)   Social Connection and Isolation Panel [NHANES]    Frequency of Communication with Friends and Family: More than three times a week    Frequency of Social Gatherings with Friends and Family: Not on file    Attends Religious Services: More than 4 times per year    Active Member of [employer] or Organizations: Yes    Attends Archivist Meetings: Not on file    Marital Status: Married  Intimate Partner Violence: Not At Risk (08/31/2021)   Humiliation, Afraid, Rape, and Kick questionnaire    Fear of Current or Ex-Partner: No    Emotionally Abused: No    Physically Abused: No    Sexually Abused: No    Review of Systems: See HPI, otherwise negative ROS  Physical Exam: Ht 5' (1.524 m)   Wt 69.4 kg   BMI 29.88 kg/m  General:   Alert, cooperative in NAD Head:  Normocephalic and atraumatic. Respiratory:  Normal work of breathing. Cardiovascular:  RRR  Impression/Plan: Gwendolyn King is here for cataract surgery.  Risks, benefits, limitations, and alternatives regarding cataract surgery have been reviewed with the patient.  Questions have been answered.  All parties agreeable.   Benay Pillow, MD  03/27/2022, 8:34 AM

## 2022-03-27 NOTE — Anesthesia Postprocedure Evaluation (Signed)
Anesthesia Post Note  Patient: Gwendolyn King  Procedure(s) Performed: CATARACT EXTRACTION PHACO AND INTRAOCULAR LENS PLACEMENT (IOC) RIGHT VIVITY LENS (Right: Eye)     Patient location during evaluation: PACU Anesthesia Type: MAC Level of consciousness: awake and alert, oriented and patient cooperative Pain management: pain level controlled Vital Signs Assessment: post-procedure vital signs reviewed and stable Respiratory status: spontaneous breathing, nonlabored ventilation and respiratory function stable Cardiovascular status: blood pressure returned to baseline and stable Postop Assessment: adequate PO intake Anesthetic complications: no   No notable events documented.  Darrin Nipper

## 2022-03-27 NOTE — Transfer of Care (Signed)
Immediate Anesthesia Transfer of Care Note  Patient: Gwendolyn King  Procedure(s) Performed: CATARACT EXTRACTION PHACO AND INTRAOCULAR LENS PLACEMENT (IOC) RIGHT VIVITY LENS (Right: Eye)  Patient Location: PACU  Anesthesia Type:MAC  Level of Consciousness: awake, alert  and oriented  Airway & Oxygen Therapy: Patient Spontanous Breathing  Post-op Assessment: Report given to RN and Post -op Vital signs reviewed and stable  Post vital signs: Reviewed and stable  Last Vitals:  Vitals Value Taken Time  BP 131/58 03/27/22 0908  Temp 36.1 C 03/27/22 0908  Pulse 55 03/27/22 0911  Resp 14 03/27/22 0911  SpO2 96 % 03/27/22 0911  Vitals shown include unvalidated device data.  Last Pain:  Vitals:   03/27/22 0908  PainSc: 0-No pain         Complications: No notable events documented.

## 2022-03-27 NOTE — Op Note (Signed)
OPERATIVE NOTE  Gwendolyn King [id number] 03/27/2022   PREOPERATIVE DIAGNOSIS:  Nuclear sclerotic cataract right eye.  H25.11   POSTOPERATIVE DIAGNOSIS:    Nuclear sclerotic cataract right eye.     PROCEDURE:  Phacoemusification with posterior chamber intraocular lens placement of the right eye   LENS:   Implant Name Type Inv. Item Serial No. Manufacturer Lot No. LRB No. Used Action  LENS IOL ACRYSOF VIVITY 21.5 - [id number]  LENS IOL ACRYSOF VIVITY 21.5 [id number] [employer]  Right 1 Implanted       Procedure(s) with comments: CATARACT EXTRACTION PHACO AND INTRAOCULAR LENS PLACEMENT (IOC) RIGHT VIVITY LENS (Right) - 5.49 00:39.1  DFT015 +21.5   ULTRASOUND TIME: 0 minutes 39 seconds.  CDE 5.49   SURGEON:  Benay Pillow, MD, MPH  ANESTHESIOLOGIST: Anesthesiologist: Darrin Nipper, MD CRNA: Jonna Clark, CRNA   ANESTHESIA:  Topical with tetracaine drops augmented with 1% preservative-free intracameral lidocaine.  ESTIMATED BLOOD LOSS: less than 1 mL.   COMPLICATIONS:  None.   DESCRIPTION OF PROCEDURE:  The patient was identified in the holding room and transported to the operating room and placed in the supine position under the operating microscope.  The right eye was identified as the operative eye and it was prepped and draped in the usual sterile ophthalmic fashion.   A 1.0 millimeter clear-corneal paracentesis was made at the 10:30 position. 0.5 ml of preservative-free 1% lidocaine with epinephrine was injected into the anterior chamber.  The anterior chamber was filled with viscoelastic.  A 2.4 millimeter keratome was used to make a near-clear corneal incision at the 8:00 position.  A curvilinear capsulorrhexis was made with a cystotome and capsulorrhexis forceps.  Balanced salt solution was used to hydrodissect and hydrodelineate the nucleus.   Phacoemulsification was then used in stop and chop fashion to remove the lens nucleus and epinucleus.  The remaining cortex  was then removed using the irrigation and aspiration handpiece. Viscoelastic was then placed into the capsular bag to distend it for lens placement.  A lens was then injected into the capsular bag.  The remaining viscoelastic was aspirated.   Wounds were hydrated with balanced salt solution.  The anterior chamber was inflated to a physiologic pressure with balanced salt solution.   The lens was well centered.  Intracameral vigamox 0.1 mL undiluted was injected into the eye and a drop placed onto the ocular surface.  No wound leaks were noted.  The patient was taken to the recovery room in stable condition without complications of anesthesia or surgery  Benay Pillow 03/27/2022, 9:06 AM

## 2022-03-28 ENCOUNTER — Other Ambulatory Visit: Payer: Self-pay

## 2022-03-28 ENCOUNTER — Encounter: Payer: Self-pay | Admitting: Ophthalmology

## 2022-03-31 DIAGNOSIS — J301 Allergic rhinitis due to pollen: Secondary | ICD-10-CM | POA: Diagnosis not present

## 2022-03-31 DIAGNOSIS — J3081 Allergic rhinitis due to animal (cat) (dog) hair and dander: Secondary | ICD-10-CM | POA: Diagnosis not present

## 2022-03-31 DIAGNOSIS — J3089 Other allergic rhinitis: Secondary | ICD-10-CM | POA: Diagnosis not present

## 2022-04-05 DIAGNOSIS — H2512 Age-related nuclear cataract, left eye: Secondary | ICD-10-CM | POA: Diagnosis not present

## 2022-04-06 DIAGNOSIS — J301 Allergic rhinitis due to pollen: Secondary | ICD-10-CM | POA: Diagnosis not present

## 2022-04-06 DIAGNOSIS — J3089 Other allergic rhinitis: Secondary | ICD-10-CM | POA: Diagnosis not present

## 2022-04-06 DIAGNOSIS — J3081 Allergic rhinitis due to animal (cat) (dog) hair and dander: Secondary | ICD-10-CM | POA: Diagnosis not present

## 2022-04-06 NOTE — Discharge Instructions (Signed)

## 2022-04-10 ENCOUNTER — Other Ambulatory Visit: Payer: Self-pay

## 2022-04-10 ENCOUNTER — Ambulatory Visit
Admission: RE | Admit: 2022-04-10 | Discharge: 2022-04-10 | Disposition: A | Discharge status: Home / Self Care | Payer: PPO | Attending: Ophthalmology | Admitting: Ophthalmology

## 2022-04-10 ENCOUNTER — Encounter
Admission: RE | Disposition: A | Discharge status: Home / Self Care | Payer: Self-pay | Source: Home / Self Care | Attending: Ophthalmology

## 2022-04-10 ENCOUNTER — Ambulatory Visit (AMBULATORY_SURGERY_CENTER): Payer: PPO | Admitting: Anesthesiology

## 2022-04-10 ENCOUNTER — Encounter: Payer: Self-pay | Admitting: Ophthalmology

## 2022-04-10 ENCOUNTER — Ambulatory Visit: Payer: PPO | Admitting: Anesthesiology

## 2022-04-10 DIAGNOSIS — J45909 Unspecified asthma, uncomplicated: Secondary | ICD-10-CM | POA: Diagnosis not present

## 2022-04-10 DIAGNOSIS — I2510 Atherosclerotic heart disease of native coronary artery without angina pectoris: Secondary | ICD-10-CM

## 2022-04-10 DIAGNOSIS — E119 Type 2 diabetes mellitus without complications: Secondary | ICD-10-CM | POA: Diagnosis not present

## 2022-04-10 DIAGNOSIS — Z79899 Other long term (current) drug therapy: Secondary | ICD-10-CM | POA: Diagnosis not present

## 2022-04-10 DIAGNOSIS — I10 Essential (primary) hypertension: Secondary | ICD-10-CM | POA: Diagnosis not present

## 2022-04-10 DIAGNOSIS — E1136 Type 2 diabetes mellitus with diabetic cataract: Secondary | ICD-10-CM | POA: Diagnosis not present

## 2022-04-10 DIAGNOSIS — I252 Old myocardial infarction: Secondary | ICD-10-CM

## 2022-04-10 DIAGNOSIS — N183 Chronic kidney disease, stage 3 unspecified: Secondary | ICD-10-CM | POA: Insufficient documentation

## 2022-04-10 DIAGNOSIS — E1122 Type 2 diabetes mellitus with diabetic chronic kidney disease: Secondary | ICD-10-CM | POA: Diagnosis not present

## 2022-04-10 DIAGNOSIS — H2512 Age-related nuclear cataract, left eye: Secondary | ICD-10-CM | POA: Diagnosis present

## 2022-04-10 DIAGNOSIS — I129 Hypertensive chronic kidney disease with stage 1 through stage 4 chronic kidney disease, or unspecified chronic kidney disease: Secondary | ICD-10-CM | POA: Insufficient documentation

## 2022-04-10 DIAGNOSIS — E039 Hypothyroidism, unspecified: Secondary | ICD-10-CM | POA: Diagnosis not present

## 2022-04-10 HISTORY — PX: CATARACT EXTRACTION W/PHACO: SHX586

## 2022-04-10 SURGERY — CATARACT EXTRACTION PHACO AND INTRAOCULAR LENS PLACEMENT (IOC)
Anesthesia: Monitor Anesthesia Care | Site: Eye | Laterality: Left

## 2022-04-10 MED ORDER — FENTANYL CITRATE (PF) 100 MCG/2ML IJ SOLN
INTRAMUSCULAR | Status: DC | PRN
  Administered 2022-04-10: 50 ug via INTRAVENOUS

## 2022-04-10 MED ORDER — EPHEDRINE SULFATE (PRESSORS) 50 MG/ML IJ SOLN
INTRAMUSCULAR | Status: DC | PRN
  Administered 2022-04-10: 10 mg via INTRAVENOUS

## 2022-04-10 MED ORDER — SIGHTPATH DOSE#1 NA HYALUR & NA CHOND-NA HYALUR IO KIT
PACK | INTRAOCULAR | Status: DC | PRN
  Administered 2022-04-10: 1 via OPHTHALMIC

## 2022-04-10 MED ORDER — SIGHTPATH DOSE#1 BSS IO SOLN
INTRAOCULAR | Status: DC | PRN
  Administered 2022-04-10: 15 mL

## 2022-04-10 MED ORDER — LACTATED RINGERS IV SOLN: INTRAVENOUS | Status: DC

## 2022-04-10 MED ORDER — MIDAZOLAM HCL 2 MG/2ML IJ SOLN
INTRAMUSCULAR | Status: DC | PRN
  Administered 2022-04-10 (×2): 1 mg via INTRAVENOUS

## 2022-04-10 MED ORDER — ARMC OPHTHALMIC DILATING DROPS
1.0000 | OPHTHALMIC | Status: DC | PRN
  Administered 2022-04-10 (×3): 1 via OPHTHALMIC

## 2022-04-10 MED ORDER — ONDANSETRON HCL 4 MG/2ML IJ SOLN: 4.0000 mg | Freq: Once | INTRAMUSCULAR | Status: DC | PRN

## 2022-04-10 MED ORDER — FENTANYL CITRATE PF 50 MCG/ML IJ SOSY: 25.0000 ug | PREFILLED_SYRINGE | INTRAMUSCULAR | Status: DC | PRN

## 2022-04-10 MED ORDER — TETRACAINE HCL 0.5 % OP SOLN
1.0000 [drp] | OPHTHALMIC | Status: DC | PRN
  Administered 2022-04-10 (×3): 1 [drp] via OPHTHALMIC

## 2022-04-10 MED ORDER — LIDOCAINE HCL (PF) 2 % IJ SOLN
INTRAOCULAR | Status: DC | PRN
  Administered 2022-04-10: 1 mL via INTRAOCULAR

## 2022-04-10 MED ORDER — SIGHTPATH DOSE#1 BSS IO SOLN
INTRAOCULAR | Status: DC | PRN
  Administered 2022-04-10: 98 mL via OPHTHALMIC

## 2022-04-10 MED ORDER — MOXIFLOXACIN HCL 0.5 % OP SOLN
OPHTHALMIC | Status: DC | PRN
  Administered 2022-04-10: 0.2 mL via OPHTHALMIC

## 2022-04-10 SURGICAL SUPPLY — 13 items
CATARACT SUITE SIGHTPATH (MISCELLANEOUS) ×1 IMPLANT
DISSECTOR HYDRO NUCLEUS 50X22 (MISCELLANEOUS) ×1
GLOVE SURG GAMMEX PI TX LF 7.5 (GLOVE) ×1
GLOVE SURG SYN 8.5  E (GLOVE) ×1
GLOVE SURG SYN 8.5 E (GLOVE) ×1 IMPLANT
LENS IOL ACRYSOF VIVITY 21.5 ×1 IMPLANT
LENS IOL VIVITY 015 21.5 ×1 IMPLANT
NDL FILTER BLUNT 18X1 1/2 (NEEDLE) ×1 IMPLANT
NEEDLE FILTER BLUNT 18X 1/2SAF (NEEDLE) ×1
NEEDLE FILTER BLUNT 18X1 1/2 (NEEDLE) ×1
SYR 3ML LL SCALE MARK (SYRINGE) ×1
SYR 5ML LL (SYRINGE) ×1
WATER STERILE IRR 250ML POUR (IV SOLUTION) ×1

## 2022-04-10 NOTE — Op Note (Signed)
OPERATIVE NOTE  Gwendolyn King [id number] 04/10/2022   PREOPERATIVE DIAGNOSIS:  Nuclear sclerotic cataract left eye.  H25.12   POSTOPERATIVE DIAGNOSIS:    Nuclear sclerotic cataract left eye.     PROCEDURE:  Phacoemusification with posterior chamber intraocular lens placement of the left eye   LENS:   Implant Name Type Inv. Item Serial No. Manufacturer Lot No. LRB No. Used Action  LENS IOL ACRYSOF VIVITY 21.5 - [id number]  LENS IOL ACRYSOF VIVITY 21.5 [id number] [employer]  Left 1 Implanted      Procedure(s): CATARACT EXTRACTION PHACO AND INTRAOCULAR LENS PLACEMENT (IOC) LEFT VIVITY LENS 3.92 00:32.4 (Left)  DFT015 +21.5   ULTRASOUND TIME: 0 minutes 32 seconds.  CDE 3.92   SURGEON:  Benay Pillow, MD, MPH   ANESTHESIA:  Topical with tetracaine drops augmented with 1% preservative-free intracameral lidocaine.  ESTIMATED BLOOD LOSS: <1 mL   COMPLICATIONS:  None.   DESCRIPTION OF PROCEDURE:  The patient was identified in the holding room and transported to the operating room and placed in the supine position under the operating microscope.  The left eye was identified as the operative eye and it was prepped and draped in the usual sterile ophthalmic fashion.   A 1.0 millimeter clear-corneal paracentesis was made at the 5:00 position. 0.5 ml of preservative-free 1% lidocaine with epinephrine was injected into the anterior chamber.  The anterior chamber was filled with viscoelastic.  A 2.4 millimeter keratome was used to make a near-clear corneal incision at the 2:00 position.  A curvilinear capsulorrhexis was made with a cystotome and capsulorrhexis forceps.  Balanced salt solution was used to hydrodissect and hydrodelineate the nucleus.   Phacoemulsification was then used in stop and chop fashion to remove the lens nucleus and epinucleus.  The remaining cortex was then removed using the irrigation and aspiration handpiece. Viscoelastic was then placed into the capsular bag to distend  it for lens placement.  A lens was then injected into the capsular bag.  The remaining viscoelastic was aspirated.   Wounds were hydrated with balanced salt solution.  The anterior chamber was inflated to a physiologic pressure with balanced salt solution.  Intracameral vigamox 0.1 mL undiltued was injected into the eye and a drop placed onto the ocular surface.  No wound leaks were noted.  The patient was taken to the recovery room in stable condition without complications of anesthesia or surgery  Benay Pillow 04/10/2022, 10:09 AM

## 2022-04-10 NOTE — Anesthesia Postprocedure Evaluation (Signed)
Anesthesia Post Note  Patient: Gwendolyn King  Procedure(s) Performed: CATARACT EXTRACTION PHACO AND INTRAOCULAR LENS PLACEMENT (IOC) LEFT VIVITY LENS 3.92 00:32.4 (Left: Eye)     Patient location during evaluation: PACU Anesthesia Type: MAC Level of consciousness: awake and alert Pain management: pain level controlled Vital Signs Assessment: post-procedure vital signs reviewed and stable Respiratory status: spontaneous breathing, nonlabored ventilation, respiratory function stable and patient connected to nasal cannula oxygen Cardiovascular status: stable and blood pressure returned to baseline Postop Assessment: no apparent nausea or vomiting Anesthetic complications: no   No notable events documented.  Molli Barrows

## 2022-04-10 NOTE — Transfer of Care (Signed)
Immediate Anesthesia Transfer of Care Note  Patient: Gwendolyn King  Procedure(s) Performed: CATARACT EXTRACTION PHACO AND INTRAOCULAR LENS PLACEMENT (IOC) LEFT VIVITY LENS 3.92 00:32.4 (Left: Eye)  Patient Location: PACU  Anesthesia Type: MAC  Level of Consciousness: awake, alert  and patient cooperative  Airway and Oxygen Therapy: Patient Spontanous Breathing   Post-op Assessment: Post-op Vital signs reviewed, Patient's Cardiovascular Status Stable, Respiratory Function Stable, Patent Airway and No signs of Nausea or vomiting  Post-op Vital Signs: Reviewed and stable  Complications: No notable events documented.

## 2022-04-10 NOTE — H&P (Signed)
[facility]   Primary Care Physician:  Leone Haven, MD Ophthalmologist: Dr. Benay Pillow  Pre-Procedure History & Physical: HPI:  Gwendolyn King is a 81 y.o. female here for cataract surgery.   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   CAD (coronary artery disease)    a. 10/2021 NSTEMI/PCI: LM 72md, LAD 55ost, 99/ (2.5x15 Onyx Frontier DES), D2 90, LCX 80p/m (2.5x30 Onyx Frontier DES), RCA 50ost, 60p, 549mnl iFR throughout). LVEDP 20-2520m.   Cataract    Bil   Chicken pox    Chronic HFrEF (heart failure with reduced ejection fraction) ([facility]  a. 10/2021 Echo: EF 35-40%, mod asymm LVH w/ sev mid-apical ant, apical HK. GrI DD. Nl RV fxn.   Chronic kidney disease    stage 3   Colon polyp    Constipation    Hyperlipidemia    Hypothyroidism    Ischemic cardiomyopathy    a. 10/2021 Echo: EF 35-40%.   NSTEMI (non-ST elevated myocardial infarction) [place]   Palpitations    over 20 yeras ago   Personal history of radiation therapy    Thyroid disease    Wears hearing aid in both ears     Past Surgical History:  Procedure Laterality Date   ABDOMINAL HYSTERECTOMY  1981   APPENDECTOMY  2012   BREAST BIOPSY Right 05/08/2017   Affirm Bx-DUCTAL CARCINOMA IN SITU (DCIS) WITH ONE FOCUS SUSPICIOUS FOR invasive   BREAST BIOPSY Right 05/17/2017   us US done at DR. Brynetts office, papillary carcinoma   BREAST BIOPSY Right 06/18/2019   Stereo bc calcs COMPATIBLE WITH PRIOR SURGICAL SITE CHANGES   BREAST LUMPECTOMY Right 06/07/2017   DUCTAL CARCINOMA IN SITU (DCIS) with microinvasion at 7:00 5 cmfn   BREAST LUMPECTOMY Right 06/07/2017   PAPILLARY LESION CONSISTENT WITH PAPILLARY CARCINOMA at retroaerolar 7:00 1.5 cmfn   BREAST LUMPECTOMY WITH  SENTINEL LYMPH NODE BIOPSY Right 06/07/2017   Wide excision of 2 foci of high-grade DCIS, microinvasion noted only on core biopsy. 2 microscopic foci lateral inferior margins. Patient elected not to proceed to reexcision.   CATARACT EXTRACTION W/PHACO Right 03/27/2022   Procedure: CATARACT EXTRACTION PHACO AND INTRAOCULAR LENS PLACEMENT ([place]OC[place]al CityIGHT VIVITY LENS;  Surgeon: KinEulogio BearD;  [facility] [place];  Laterality: Right;  5.49 00:39.1   COLONOSCOPY     COLONOSCOPY W/ POLYPECTOMY     CORONARY STENT INTERVENTION N/A 10/25/2021   Procedure: CORONARY STENT INTERVENTION;  Surgeon: EndNelva BushD;  [facility];  Service: Cardiovascular;  Laterality: N/A;   CYSTOCELE REPAIR N/A 09/11/2019   Procedure: ANTERIOR COLPORRHAPHY;  Surgeon: HarGae DryD;  Location: [facility];  Service: Gynecology;  Laterality: N/A;   FINGER FRACTURE SURGERY Right    5th   HAMMER TOE SURGERY  09/20/2017   INTRAVASCULAR PRESSURE WIRE/FFR STUDY N/A 10/25/2021   Procedure: INTRAVASCULAR PRESSURE WIRE/FFR STUDY;  Surgeon: EndNelva BushD;  [facility];  Service: Cardiovascular;  Laterality: N/A;   JOINT REPLACEMENT     right knee   LEFT HEART CATH AND CORONARY ANGIOGRAPHY N/A 10/25/2021   Procedure: LEFT HEART CATH AND CORONARY ANGIOGRAPHY;  Surgeon: End,  Harrell Gave, MD;  Location: [place] [facility];  Service: Cardiovascular;  Laterality: N/A;   ROTATOR CUFF REPAIR Right    TONSILLECTOMY AND ADENOIDECTOMY  1963   TOTAL KNEE ARTHROPLASTY Right 10/17/2016   Procedure: TOTAL KNEE ARTHROPLASTY;  Surgeon: Melrose Nakayama, MD;  Location: [facility];  Service: Orthopedics;  Laterality: Right;   VAGINAL DELIVERY     3    Prior to Admission medications   Medication Sig Start Date End Date Taking? Authorizing Provider  acetaminophen (TYLENOL) 500 MG tablet Take 1,000 mg by mouth at bedtime.   Yes [provider]  aspirin EC 81 MG  tablet Take 81 mg by mouth at bedtime.    Yes [provider]  AZELASTINE HCL OP Place 1 drop into both eyes 3 (three) times daily as needed (dry/irritated eyes.).   Yes [provider]  Biotin 5 MG TABS Take 5 mg by mouth daily.   Yes [provider]  calcium-vitamin D (OSCAL WITH D) 500-200 MG-UNIT tablet Take 1 tablet by mouth daily.    Yes [provider]  carvedilol (COREG) 6.25 MG tablet Take 1 tablet (6.25 mg total) by mouth 2 (two) times daily. 11/28/21 04/10/22 Yes Theora Gianotti, NP  cholecalciferol (VITAMIN D) 25 MCG (1000 UNIT) tablet Take 1,000 Units by mouth every evening.   Yes [provider]  clopidogrel (PLAVIX) 75 MG tablet Take 75 mg by mouth daily.   Yes [provider]  denosumab (PROLIA) 60 MG/ML SOSY injection Inject 60 mg into the skin every 6 (six) months.   Yes [provider]  Evolocumab (REPATHA) 140 MG/ML SOSY Inject 140 mg into the skin every 14 (fourteen) days. 11/15/21  Yes Leone Haven, MD  fluticasone [facility]) 50 MCG/ACT nasal spray Place into both nostrils daily as needed for allergies or rhinitis.   Yes [provider]  Javier Docker Oil 500 MG CAPS Take 500 mg by mouth daily.   Yes [provider]  letrozole (FEMARA) 2.5 MG tablet Take 1 tablet (2.5 mg total) by mouth daily. 01/27/22  Yes Lloyd Huger, MD  levocetirizine (XYZAL) 5 MG tablet Take 5 mg by mouth every evening.  05/09/18  Yes [provider]  LEVOXYL 75 MCG tablet Take 1 tablet by mouth once daily 06/14/21  Yes Leone Haven, MD  Multiple Vitamin (MULTIVITAMIN WITH MINERALS) TABS tablet Take 1 tablet by mouth daily. Adult 50+   Yes [provider]  sacubitril-valsartan (ENTRESTO) 24-26 MG Take 1 tablet by mouth 2 (two) times daily. 01/24/22  Yes Gollan, Kathlene November, MD  vitamin B-12 (CYANOCOBALAMIN) 1000 MCG tablet Take 1,000 mcg by mouth daily.   Yes [provider]  EPINEPHrine  0.3 mg/0.3 mL IJ SOAJ injection Inject 0.3 mg into the muscle as needed for anaphylaxis.    [provider]  polyethylene glycol powder (GLYCOLAX/MIRALAX) powder Take 17 g by mouth daily. With coffee    [provider]    Allergies as of 02/27/2022 - Review Complete 01/05/2022  Allergen Reaction Noted   Singulair [montelukast] Itching 05/03/2020   Lipitor [atorvastatin] Other (See Comments) 12/29/2015   Simvastatin Other (See Comments) 12/29/2015    Family History  Problem Relation Age of Onset   Arthritis Mother    Heart disease Mother    Hypertension Mother    Arthritis Father    Heart disease Father    Hypertension Father    Heart attack Father    Stroke Maternal Grandmother  Cancer Maternal Aunt        abdominal?   Hyperlipidemia Maternal Aunt    Diabetes Paternal Aunt    Glaucoma Brother    Hyperlipidemia Other    Diabetes Other    Breast cancer Neg Hx     Social History   Socioeconomic History   Marital status: Married    Spouse name: Not on file   Number of children: Not on file   Years of education: Not on file   Highest education level: Not on file  Occupational History   Not on file  Tobacco Use   Smoking status: Never   Smokeless tobacco: Never  Vaping Use   Vaping Use: Never used  Substance and Sexual Activity   Alcohol use: Yes    Comment: wine occ   Drug use: No   Sexual activity: Not Currently    Birth control/protection: Post-menopausal  Other Topics Concern   Not on file  Social History Narrative   Lives with husband in [place]. 3 children.      Work - retired, Pharmacist, community, Pharmacist, hospital      Diet - regular, limits sugar, eats small meals   Exercise - Jazzercise 4 days per week   Social Determinants of Health   Financial Resource Strain: Low Risk  (08/31/2021)   Overall Financial Resource Strain (CARDIA)    Difficulty of Paying Living Expenses: Not hard at all  Food Insecurity: No Food Insecurity (08/31/2021)    Hunger Vital Sign    Worried About Running Out of Food in the Last Year: Never true    Ran Out of Food in the Last Year: Never true  Transportation Needs: No Transportation Needs (08/31/2021)   PRAPARE - Hydrologist (Medical): No    Lack of Transportation (Non-Medical): No  Physical Activity: Sufficiently Active (04/18/2018)   Exercise Vital Sign    Days of Exercise per Week: 4 days    Minutes of Exercise per Session: 60 min  Stress: No Stress Concern Present (08/31/2021)   [place]    Feeling of Stress : Not at all  Social Connections: [place] (08/31/2021)   Social Connection and Isolation Panel [NHANES]    Frequency of Communication with Friends and Family: More than three times a week    Frequency of Social Gatherings with Friends and Family: Not on file    Attends Religious Services: More than 4 times per year    Active Member of [employer] or Organizations: Yes    Attends Archivist Meetings: Not on file    Marital Status: Married  Intimate Partner Violence: Not At Risk (08/31/2021)   Humiliation, Afraid, Rape, and Kick questionnaire    Fear of Current or Ex-Partner: No    Emotionally Abused: No    Physically Abused: No    Sexually Abused: No    Review of Systems: See HPI, otherwise negative ROS  Physical Exam: BP (!) 148/53   Pulse (!) 59   Temp 97.7 F (36.5 C)   Wt 68 kg   SpO2 98%   BMI 29.29 kg/m  General:   Alert, cooperative in NAD Head:  Normocephalic and atraumatic. Respiratory:  Normal work of breathing. Cardiovascular:  RRR  Impression/Plan: Peggye Fothergill is here for cataract surgery.  Risks, benefits, limitations, and alternatives regarding cataract surgery have been reviewed with the patient.  Questions have been answered.  All parties agreeable.   Benay Pillow,  MD  04/10/2022, 9:40 AM

## 2022-04-10 NOTE — Anesthesia Preprocedure Evaluation (Signed)
Anesthesia Evaluation  Patient identified by MRN, date of birth, ID band Patient awake    Reviewed: Allergy & Precautions, H&P , NPO status , Patient's Chart, lab work & pertinent test results, reviewed documented beta blocker date and time   Airway Mallampati: II  TM Distance: >3 FB Neck ROM: full    Dental no notable dental hx. (+) Teeth Intact   Pulmonary asthma ,    Pulmonary exam normal breath sounds clear to auscultation       Cardiovascular Exercise Tolerance: Poor hypertension, On Medications + CAD and + Past MI   Rhythm:regular Rate:Normal     Neuro/Psych CVA negative psych ROS   GI/Hepatic negative GI ROS, Neg liver ROS,   Endo/Other  diabetesHypothyroidism   Renal/GU Renal disease     Musculoskeletal   Abdominal   Peds  Hematology negative hematology ROS (+)   Anesthesia Other Findings   Reproductive/Obstetrics negative OB ROS                             Anesthesia Physical Anesthesia Plan  ASA: 3  Anesthesia Plan: MAC   Post-op Pain Management:    Induction:   PONV Risk Score and Plan:   Airway Management Planned:   Additional Equipment:   Intra-op Plan:   Post-operative Plan:   Informed Consent: I have reviewed the patients History and Physical, chart, labs and discussed the procedure including the risks, benefits and alternatives for the proposed anesthesia with the patient or authorized representative who has indicated his/her understanding and acceptance.       Plan Discussed with: CRNA  Anesthesia Plan Comments:         Anesthesia Quick Evaluation

## 2022-04-11 ENCOUNTER — Encounter: Payer: Self-pay | Admitting: Ophthalmology

## 2022-04-14 ENCOUNTER — Other Ambulatory Visit: Payer: Self-pay

## 2022-04-14 DIAGNOSIS — I639 Cerebral infarction, unspecified: Secondary | ICD-10-CM

## 2022-04-14 DIAGNOSIS — E039 Hypothyroidism, unspecified: Secondary | ICD-10-CM

## 2022-04-14 DIAGNOSIS — E782 Mixed hyperlipidemia: Secondary | ICD-10-CM

## 2022-04-14 DIAGNOSIS — J301 Allergic rhinitis due to pollen: Secondary | ICD-10-CM | POA: Diagnosis not present

## 2022-04-14 DIAGNOSIS — J3081 Allergic rhinitis due to animal (cat) (dog) hair and dander: Secondary | ICD-10-CM | POA: Diagnosis not present

## 2022-04-14 DIAGNOSIS — J3089 Other allergic rhinitis: Secondary | ICD-10-CM | POA: Diagnosis not present

## 2022-04-14 MED ORDER — REPATHA 140 MG/ML ~~LOC~~ SOSY: 140.0000 mg | PREFILLED_SYRINGE | SUBCUTANEOUS | 1 refills | Status: DC

## 2022-04-14 MED ORDER — LEVOXYL 75 MCG PO TABS: ORAL_TABLET | ORAL | 2 refills | Status: DC

## 2022-04-15 NOTE — Progress Notes (Unsigned)
Cardiology Office Note  Date:  04/18/2022   ID:  Nickcole, Bralley Oct 23, 1940, MRN [id number]  PCP:  Leone Haven, MD   Chief Complaint  Patient presents with   3 month follow up     "Doing well." Medications reviewed by the patient verbally.     HPI:  Gwendolyn King is a 81 year-old woman with long history of palpitations,  hyperlipidemia,  Aortic atherosclerosis on CT scan 2012 leg edema treated with Lasix as needed,  CAD, Stent placed to left circumflex and LAD Ejection fraction 35 to 40% October 24, 2021 who presents for follow-up of her palpitations, non-STEMI, stent placement  Seen by one of our providers November 18, 2021 Amlodipine held, carvedilol started, cardiac rehab ordered following non-STEMI Last seen by myself in clinic 5/23  On today's visit, doing well Additional stress, husband with medical issues, diabetes, recent hospitalization  early dementia, unmotivated to exercise, legs getting weaker  Overall feeling well, denies chest pain or shortness of breath on exertion  Completed cardiac rehab  Repatha and entreso expensive, in the donut Discussed alternative medications, side effects on statins, joint problem,  BP at home 453 systolic Has not been checking on a regular basis  EKG personally reviewed by myself on todays visit Sinus bradycardia rate 54 bpm no significant ST-T wave changes  Other past medical history reviewed Non-STEMI October 24, 2021 Echo showed an EF of 35 to 40% with grade 1 diastolic dysfunction, and severe hypokinesis of the left ventricular, mid-apical anterior, and apical walls, concerning for LAD infarct.   Stent placed to left circumflex and LAD  Diagnostic catheterization was carried out and showed 99% stenosis in the mid LAD with a 90% stenosis in the second diagonal.  She also had an 80% stenosis in the proximal to mid left circumflex with abnormal fractional flow reserve.  She had nonobstructive disease throughout the right coronary.   She underwent drug-eluting stent placement to the mid LAD and proximal/mid left circumflex.    Post cath, creatinine rose to 1.22 and ACEi/ARB/arni/mra were avoided.  History of statin and Zetia intolerance On Repatha Left thalamic stroke history, on aspirin Plavix Repatha  Lab work reviewed A1c 5.1, total cholesterol 114 LDL 37  Simvastatin,  had myalgias    PMH:   has a past medical history of Allergy, Arthritis, Asthma, Breast cancer ([place]) (05/08/2017), CAD (coronary artery disease), Cataract, Chicken pox, Chronic HFrEF (heart failure with reduced ejection fraction) ([place]), Chronic kidney disease, Colon polyp, Constipation, Hyperlipidemia, Hypothyroidism, Ischemic cardiomyopathy, NSTEMI (non-ST elevated myocardial infarction) ([place]) (10/24/2021), Palpitations, Personal history of radiation therapy, Thyroid disease, and Wears hearing aid in both ears.  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 2 microscopic foci  lateral inferior margins. Patient elected not to proceed to reexcision.   CATARACT EXTRACTION W/PHACO Right 03/27/2022   Procedure: CATARACT EXTRACTION PHACO AND INTRAOCULAR LENS PLACEMENT ([place]) RIGHT VIVITY LENS;  Surgeon: Eulogio Bear, MD;  Location: [facility];  Service: Ophthalmology;  Laterality: Right;  5.49 00:39.1   CATARACT EXTRACTION W/PHACO Left 04/10/2022   Procedure: CATARACT EXTRACTION PHACO AND INTRAOCULAR LENS PLACEMENT ([facility]) LEFT VIVITY LENS 3.92 00:32.4;  Surgeon: Eulogio Bear, MD;  Location: [facility];  Service: Ophthalmology;  Laterality: Left;   COLONOSCOPY     COLONOSCOPY W/ POLYPECTOMY     CORONARY STENT INTERVENTION N/A 10/25/2021   Procedure: CORONARY STENT INTERVENTION;  Surgeon: Nelva Bush, MD;  Location: [facility];  Service: Cardiovascular;  Laterality: N/A;   CYSTOCELE REPAIR N/A 09/11/2019   Procedure: ANTERIOR COLPORRHAPHY;  Surgeon: Gae Dry, MD;  Location: [facility];  Service: Gynecology;  Laterality: N/A;   FINGER FRACTURE SURGERY Right    5th   HAMMER TOE SURGERY  09/20/2017   INTRAVASCULAR PRESSURE WIRE/FFR STUDY N/A 10/25/2021   Procedure: INTRAVASCULAR PRESSURE WIRE/FFR STUDY;  Surgeon: Nelva Bush, MD;  Location: [facility];  Service: Cardiovascular;  Laterality: N/A;   JOINT REPLACEMENT     right knee   LEFT HEART CATH AND CORONARY ANGIOGRAPHY N/A 10/25/2021   Procedure: LEFT HEART CATH AND CORONARY ANGIOGRAPHY;  Surgeon: Nelva Bush, MD;  Location: [facility];  Service: Cardiovascular;  Laterality: N/A;   ROTATOR CUFF REPAIR Right    TONSILLECTOMY AND ADENOIDECTOMY  1963   TOTAL KNEE ARTHROPLASTY Right 10/17/2016   Procedure: TOTAL KNEE ARTHROPLASTY;  Surgeon: Melrose Nakayama, MD;  Location: [facility];  Service: Orthopedics;  Laterality: Right;   VAGINAL DELIVERY     3   Current Outpatient Medications on File Prior to Visit  Medication Sig Dispense Refill   acetaminophen (TYLENOL) 500 MG tablet Take 1,000 mg by mouth at bedtime.     aspirin EC 81 MG tablet Take 81 mg by mouth at bedtime.      AZELASTINE HCL OP Place 1 drop into both eyes 3 (three) times daily as needed (dry/irritated eyes.).     Biotin 5 MG  TABS Take 5 mg by mouth daily.     calcium-vitamin D (OSCAL WITH D) 500-200 MG-UNIT tablet Take 1 tablet by mouth daily.      carvedilol (COREG) 6.25 MG tablet Take 1 tablet (6.25 mg total) by mouth 2 (two) times daily. 180 tablet 0   cholecalciferol (VITAMIN D) 25 MCG (1000 UNIT) tablet Take 1,000 Units by mouth every evening.     clopidogrel (PLAVIX) 75 MG tablet Take 75 mg by mouth daily.     denosumab (PROLIA) 60 MG/ML SOSY injection Inject 60 mg into the skin every 6 (six) months.     EPINEPHrine 0.3 mg/0.3 mL IJ SOAJ injection Inject 0.3 mg into the muscle as needed for anaphylaxis.     Evolocumab (REPATHA) 140 MG/ML SOSY Inject 140 mg into the skin every 14 (fourteen) days. 6 mL 1   fluticasone (FLONASE) 50 MCG/ACT nasal spray Place into both nostrils daily as needed for allergies or rhinitis.     Krill Oil 500 MG CAPS Take 500 mg by mouth daily.     letrozole (FEMARA) 2.5 MG tablet Take 1 tablet (2.5 mg total) by mouth daily. 90 tablet 1   levocetirizine (XYZAL) 5 MG tablet Take 5 mg by mouth every evening.   3  LEVOXYL 75 MCG tablet Take 1 tablet by mouth once daily 90 tablet 2   Multiple Vitamin (MULTIVITAMIN WITH MINERALS) TABS tablet Take 1 tablet by mouth daily. Adult 50+     polyethylene glycol powder (GLYCOLAX/MIRALAX) powder Take 17 g by mouth daily. With coffee     sacubitril-valsartan (ENTRESTO) 24-26 MG Take 1 tablet by mouth 2 (two) times daily. 180 tablet 3   vitamin B-12 (CYANOCOBALAMIN) 1000 MCG tablet Take 1,000 mcg by mouth daily.     No current facility-administered medications on file prior to visit.    Allergies:   Lipitor [atorvastatin] (palpitations) and Simvastatin   Social History:  The patient  reports that she has never smoked. She has never used smokeless tobacco. She reports current alcohol use. She reports that she does not use drugs.   Family History:   family history includes Arthritis in her father and mother; Cancer in her maternal aunt; Diabetes in  her paternal aunt and another family member; Glaucoma in her brother; Heart attack in her father; Heart disease in her father and mother; Hyperlipidemia in her maternal aunt and another family member; Hypertension in her father and mother; Stroke in her maternal grandmother.    Review of Systems: Review of Systems  Constitutional: Negative.   HENT: Negative.    Respiratory: Negative.    Cardiovascular: Negative.   Gastrointestinal: Negative.   Musculoskeletal: Negative.   Neurological: Negative.   Psychiatric/Behavioral: Negative.    All other systems reviewed and are negative.    PHYSICAL EXAM: VS:  BP (!) 140/60 (BP Location: Left Arm, Patient Position: Sitting, Cuff Size: Normal)   Pulse (!) 54   Ht 5' (1.524 m)   Wt 150 lb 6 oz (68.2 kg)   SpO2 99%   BMI 29.37 kg/m  , BMI Body mass index is 29.37 kg/m. Constitutional:  oriented to person, place, and time. No distress.  HENT:  Head: Grossly normal Eyes:  no discharge. No scleral icterus.  Neck: No JVD, no carotid bruits  Cardiovascular: Regular rate and rhythm, no murmurs appreciated Pulmonary/Chest: Clear to auscultation bilaterally, no wheezes or rails Abdominal: Soft.  no distension.  no tenderness.  Musculoskeletal: Normal range of motion Neurological:  normal muscle tone. Coordination normal. No atrophy Skin: Skin warm and dry Psychiatric: normal affect, pleasant  Recent Labs: 05/04/2021: TSH 2.15 10/24/2021: B Natriuretic Peptide 76.6; Magnesium 2.0 01/05/2022: ALT 18; BUN 25; Creatinine, Ser 1.16; Hemoglobin 13.9; Platelets 90; Potassium 3.8; Sodium 139    Lipid Panel Lab Results  Component Value Date   CHOL 114 12/09/2021   HDL 47.20 12/09/2021   LDLCALC 37 12/09/2021   TRIG 148.0 12/09/2021    Wt Readings from Last 3 Encounters:  04/18/22 150 lb 6 oz (68.2 kg)  04/10/22 150 lb (68 kg)  03/20/22 153 lb (69.4 kg)     ASSESSMENT AND PLAN:  Coronary artery disease with stable angina Currently with no  symptoms of angina. No further workup at this time. Continue current medication regimen.  Ischemic cardiomyopathy Prior ejection fraction 35% in March 2023 On carvedilol 6.25 twice daily, Entresto 24/26 mg twice a day. We will add spironolactone 25 mg daily, will need BMP in 1 month, prefers to have this done with Dr. Caryl Bis Recommend she check blood pressure.  If enough room on her blood pressure we could increase dose of Entresto  Essential hypertension - Plan: EKG 12-Lead Add spironolactone as above, continue Coreg and Entresto  Palpitations - Plan: EKG 12-Lead on low-dose beta-blocker,  Coreg Symptoms well controlled  Hyperlipidemia Statin intolerance Doing well on Repatha, cholesterol at goal Does not feel she would qualify for patient assistance  Obesity We have encouraged continued exercise, careful diet management in an effort to lose weight.  Aortic atherosclerosis Seen on CT scan, images  Cholesterol at goal   Total encounter time more than 30 minutes  Greater than 50% was spent in counseling and coordination of care with the patient    No orders of the defined types were placed in this encounter.    Signed, Esmond Plants, M.D., Ph.D. 04/18/2022  [place], [place]

## 2022-04-18 ENCOUNTER — Telehealth: Payer: Self-pay | Admitting: Family Medicine

## 2022-04-18 ENCOUNTER — Ambulatory Visit: Payer: PPO | Attending: Cardiovascular Disease | Admitting: Cardiovascular Disease

## 2022-04-18 ENCOUNTER — Encounter: Payer: Self-pay | Admitting: Cardiovascular Disease

## 2022-04-18 VITALS — BP 140/60 | HR 54 | Ht 60.0 in | Wt 150.4 lb

## 2022-04-18 DIAGNOSIS — I255 Ischemic cardiomyopathy: Secondary | ICD-10-CM | POA: Diagnosis not present

## 2022-04-18 DIAGNOSIS — I872 Venous insufficiency (chronic) (peripheral): Secondary | ICD-10-CM

## 2022-04-18 DIAGNOSIS — M791 Myalgia, unspecified site: Secondary | ICD-10-CM | POA: Diagnosis not present

## 2022-04-18 DIAGNOSIS — T466X5A Adverse effect of antihyperlipidemic and antiarteriosclerotic drugs, initial encounter: Secondary | ICD-10-CM

## 2022-04-18 DIAGNOSIS — I10 Essential (primary) hypertension: Secondary | ICD-10-CM

## 2022-04-18 DIAGNOSIS — E785 Hyperlipidemia, unspecified: Secondary | ICD-10-CM

## 2022-04-18 DIAGNOSIS — N183 Chronic kidney disease, stage 3 unspecified: Secondary | ICD-10-CM

## 2022-04-18 DIAGNOSIS — I2510 Atherosclerotic heart disease of native coronary artery without angina pectoris: Secondary | ICD-10-CM | POA: Diagnosis not present

## 2022-04-18 MED ORDER — SPIRONOLACTONE 25 MG PO TABS: 25.0000 mg | ORAL_TABLET | Freq: Every day | ORAL | 3 refills | Status: DC

## 2022-04-18 NOTE — Telephone Encounter (Signed)
I called and spoke with the patient and she is scheduled for labs  in 1 month.  Marshawn Normoyle,cma

## 2022-04-18 NOTE — Telephone Encounter (Signed)
-----   Message from Minna Merritts, MD sent at 04/18/2022 12:56 PM EDT ----- Saw Ms. [facility], she is doing well.  Started spironolactone 25 mg daily . sorry to put this on your shoulders but she was hoping you would be able to do a BMP in 4 weeks time.  She prefers to have it done through your office than through [facility].  Would you mind helping me? Thx TG

## 2022-04-18 NOTE — Patient Instructions (Addendum)
Medication Instructions:  Please start spironolactone 25 mg daily  If you need a refill on your cardiac medications before your next appointment, please call your pharmacy.   Lab work: [employer] with Dr. Caryl Bis  Testing/Procedures: No new testing needed  Follow-Up: At [facility], you and your health needs are our priority.  As part of our continuing mission to provide you with exceptional heart care, we have created designated Provider Care Teams.  These Care Teams include your primary Cardiologist (physician) and Advanced Practice Providers (APPs -  Physician Assistants and Nurse Practitioners) who all work together to provide you with the care you need, when you need it.  You will need a follow up appointment in 6 months  Providers on your designated Care Team:   Murray Hodgkins, NP Christell Faith, PA-C Cadence Kathlen Mody, [place]  COVID-19 Vaccine Information can be found at: [email or web address].uk For questions related to vaccine distribution or appointments, please email [email or web address] or call [phone].

## 2022-04-18 NOTE — Telephone Encounter (Signed)
Please get the patient set up with lab work in 4 weeks.  Her cardiologist noted the patient wanted this done through our office.  Orders placed.

## 2022-04-20 DIAGNOSIS — J3081 Allergic rhinitis due to animal (cat) (dog) hair and dander: Secondary | ICD-10-CM | POA: Diagnosis not present

## 2022-04-20 DIAGNOSIS — J3089 Other allergic rhinitis: Secondary | ICD-10-CM | POA: Diagnosis not present

## 2022-04-20 DIAGNOSIS — J301 Allergic rhinitis due to pollen: Secondary | ICD-10-CM | POA: Diagnosis not present

## 2022-04-26 NOTE — Addendum Note (Signed)
Addended by: Anselm Pancoast on: 04/26/2022 10:54 AM   Modules accepted: Orders

## 2022-04-27 DIAGNOSIS — J3081 Allergic rhinitis due to animal (cat) (dog) hair and dander: Secondary | ICD-10-CM | POA: Diagnosis not present

## 2022-04-27 DIAGNOSIS — J301 Allergic rhinitis due to pollen: Secondary | ICD-10-CM | POA: Diagnosis not present

## 2022-04-27 DIAGNOSIS — J3089 Other allergic rhinitis: Secondary | ICD-10-CM | POA: Diagnosis not present

## 2022-05-03 DIAGNOSIS — Z0100 Encounter for examination of eyes and vision without abnormal findings: Secondary | ICD-10-CM | POA: Diagnosis not present

## 2022-05-04 DIAGNOSIS — J301 Allergic rhinitis due to pollen: Secondary | ICD-10-CM | POA: Diagnosis not present

## 2022-05-04 DIAGNOSIS — J3081 Allergic rhinitis due to animal (cat) (dog) hair and dander: Secondary | ICD-10-CM | POA: Diagnosis not present

## 2022-05-04 DIAGNOSIS — J3089 Other allergic rhinitis: Secondary | ICD-10-CM | POA: Diagnosis not present

## 2022-05-11 ENCOUNTER — Encounter: Payer: Self-pay | Admitting: Family Medicine

## 2022-05-11 DIAGNOSIS — I679 Cerebrovascular disease, unspecified: Secondary | ICD-10-CM

## 2022-05-11 DIAGNOSIS — J301 Allergic rhinitis due to pollen: Secondary | ICD-10-CM | POA: Diagnosis not present

## 2022-05-11 DIAGNOSIS — J3081 Allergic rhinitis due to animal (cat) (dog) hair and dander: Secondary | ICD-10-CM | POA: Diagnosis not present

## 2022-05-11 DIAGNOSIS — J3089 Other allergic rhinitis: Secondary | ICD-10-CM | POA: Diagnosis not present

## 2022-05-16 DIAGNOSIS — J3081 Allergic rhinitis due to animal (cat) (dog) hair and dander: Secondary | ICD-10-CM | POA: Diagnosis not present

## 2022-05-16 DIAGNOSIS — J3089 Other allergic rhinitis: Secondary | ICD-10-CM | POA: Diagnosis not present

## 2022-05-16 DIAGNOSIS — J301 Allergic rhinitis due to pollen: Secondary | ICD-10-CM | POA: Diagnosis not present

## 2022-05-19 ENCOUNTER — Other Ambulatory Visit (INDEPENDENT_AMBULATORY_CARE_PROVIDER_SITE_OTHER): Payer: PPO

## 2022-05-19 DIAGNOSIS — I255 Ischemic cardiomyopathy: Secondary | ICD-10-CM

## 2022-05-19 LAB — BASIC METABOLIC PANEL WITH GFR
BUN: 23 mg/dL (ref 6–23)
CO2: 31 meq/L (ref 19–32)
Calcium: 10.3 mg/dL (ref 8.4–10.5)
Chloride: 105 meq/L (ref 96–112)
Creatinine, Ser: 1.12 mg/dL (ref 0.40–1.20)
GFR: 46.82 mL/min — ABNORMAL LOW
Glucose, Bld: 98 mg/dL (ref 70–99)
Potassium: 4.2 meq/L (ref 3.5–5.1)
Sodium: 142 meq/L (ref 135–145)

## 2022-05-23 DIAGNOSIS — J301 Allergic rhinitis due to pollen: Secondary | ICD-10-CM | POA: Diagnosis not present

## 2022-05-23 DIAGNOSIS — J3081 Allergic rhinitis due to animal (cat) (dog) hair and dander: Secondary | ICD-10-CM | POA: Diagnosis not present

## 2022-05-23 DIAGNOSIS — J3089 Other allergic rhinitis: Secondary | ICD-10-CM | POA: Diagnosis not present

## 2022-05-30 DIAGNOSIS — J3081 Allergic rhinitis due to animal (cat) (dog) hair and dander: Secondary | ICD-10-CM | POA: Diagnosis not present

## 2022-05-30 DIAGNOSIS — J301 Allergic rhinitis due to pollen: Secondary | ICD-10-CM | POA: Diagnosis not present

## 2022-05-30 DIAGNOSIS — J3089 Other allergic rhinitis: Secondary | ICD-10-CM | POA: Diagnosis not present

## 2022-06-09 ENCOUNTER — Ambulatory Visit (INDEPENDENT_AMBULATORY_CARE_PROVIDER_SITE_OTHER): Payer: PPO | Admitting: Family Medicine

## 2022-06-09 ENCOUNTER — Encounter: Payer: Self-pay | Admitting: Family Medicine

## 2022-06-09 DIAGNOSIS — G8929 Other chronic pain: Secondary | ICD-10-CM | POA: Diagnosis not present

## 2022-06-09 DIAGNOSIS — M545 Low back pain, unspecified: Secondary | ICD-10-CM

## 2022-06-09 DIAGNOSIS — E782 Mixed hyperlipidemia: Secondary | ICD-10-CM | POA: Diagnosis not present

## 2022-06-09 DIAGNOSIS — E039 Hypothyroidism, unspecified: Secondary | ICD-10-CM | POA: Diagnosis not present

## 2022-06-09 DIAGNOSIS — I10 Essential (primary) hypertension: Secondary | ICD-10-CM | POA: Diagnosis not present

## 2022-06-09 DIAGNOSIS — I255 Ischemic cardiomyopathy: Secondary | ICD-10-CM

## 2022-06-09 NOTE — Assessment & Plan Note (Signed)
Some mild swelling for the last few weeks.  Otherwise seems euvolemic.  We will check a BNP.  She will continue Entresto 1 tablet twice daily, spironolactone 25 mg daily, and carvedilol 6.25 mg twice daily.

## 2022-06-09 NOTE — Patient Instructions (Signed)
Nice to see you. We will contact you with your lab results. 

## 2022-06-09 NOTE — Assessment & Plan Note (Signed)
Check TSH.  Continue Levoxyl 75 mcg daily.

## 2022-06-09 NOTE — Assessment & Plan Note (Signed)
Improving.  She will monitor.

## 2022-06-09 NOTE — Assessment & Plan Note (Signed)
Well-controlled.  She will continue carvedilol 6.25 mg twice daily, spironolactone 25 mg daily, and Entresto 1 tablet twice daily.  Check BMP today.

## 2022-06-09 NOTE — Progress Notes (Signed)
Tommi Rumps, MD Phone: [phone]  Gwendolyn King is a 81 y.o. female who presents today for f/u.  HYPERTENSION/ischemic cardiomyopathy Disease Monitoring Home BP Monitoring does not remember numbers Chest pain- no    Dyspnea- no Orthopnea-no  PND-no Medications Compliance-  taking coreg, entresto, spironolactone.  Edema- mild in lower extremities for a few weeks BMET    Component Value Date/Time   NA 142 05/19/2022 1058   K 4.2 05/19/2022 1058   CL 105 05/19/2022 1058   CO2 31 05/19/2022 1058   GLUCOSE 98 05/19/2022 1058   BUN 23 05/19/2022 1058   CREATININE 1.12 05/19/2022 1058   CREATININE 1.28 (H) 07/05/2012 1559   CALCIUM 10.3 05/19/2022 1058   GFRNONAA 48 (L) 01/05/2022 1026   GFRAA 49 (L) 06/01/2017 0851   Hyperlipidemia: Taking Repatha.  No right upper quadrant pain or myalgias.  HYPOTHYROIDISM Disease Monitoring Weight changes: no  Skin Changes: no Heat/Cold intolerance: some cold intolerance  Medication Monitoring Compliance:  levoxyl   Last TSH:   Lab Results  Component Value Date   TSH 2.15 05/04/2021   Chronic back pain: Patient notes this has improved since her last visit.  She did have a day in September where she had sciatica symptoms though that has not recurred.  Social History   Tobacco Use  Smoking Status Never  Smokeless Tobacco Never    Current Outpatient Medications on File Prior to Visit  Medication Sig Dispense Refill   acetaminophen (TYLENOL) 500 MG tablet Take 1,000 mg by mouth at bedtime.     aspirin EC 81 MG tablet Take 81 mg by mouth at bedtime.      Biotin 5 MG TABS Take 5 mg by mouth daily.     calcium-vitamin D (OSCAL WITH D) 500-200 MG-UNIT tablet Take 1 tablet by mouth daily.      cholecalciferol (VITAMIN D) 25 MCG (1000 UNIT) tablet Take 1,000 Units by mouth every evening.     clopidogrel (PLAVIX) 75 MG tablet Take 75 mg by mouth daily.     denosumab (PROLIA) 60 MG/ML SOSY injection Inject 60 mg into the skin every 6  (six) months.     EPINEPHrine 0.3 mg/0.3 mL IJ SOAJ injection Inject 0.3 mg into the muscle as needed for anaphylaxis.     Evolocumab (REPATHA) 140 MG/ML SOSY Inject 140 mg into the skin every 14 (fourteen) days. 6 mL 1   fluticasone (FLONASE) 50 MCG/ACT nasal spray Place into both nostrils daily as needed for allergies or rhinitis.     Krill Oil 500 MG CAPS Take 500 mg by mouth daily.     letrozole (FEMARA) 2.5 MG tablet Take 1 tablet (2.5 mg total) by mouth daily. 90 tablet 1   levocetirizine (XYZAL) 5 MG tablet Take 5 mg by mouth every evening.   3   LEVOXYL 75 MCG tablet Take 1 tablet by mouth once daily 90 tablet 2   Multiple Vitamin (MULTIVITAMIN WITH MINERALS) TABS tablet Take 1 tablet by mouth daily. Adult 50+     polyethylene glycol powder (GLYCOLAX/MIRALAX) powder Take 17 g by mouth daily. With coffee     sacubitril-valsartan (ENTRESTO) 24-26 MG Take 1 tablet by mouth 2 (two) times daily. 180 tablet 3   spironolactone (ALDACTONE) 25 MG tablet Take 1 tablet (25 mg total) by mouth daily. 90 tablet 3   vitamin B-12 (CYANOCOBALAMIN) 1000 MCG tablet Take 1,000 mcg by mouth daily.     carvedilol (COREG) 6.25 MG tablet Take 1 tablet (6.25  mg total) by mouth 2 (two) times daily. 180 tablet 0   No current facility-administered medications on file prior to visit.     ROS see history of present illness  Objective  Physical Exam Vitals:   06/09/22 1401  BP: 120/70  Pulse: 74  Temp: 97.6 F (36.4 C)  SpO2: 98%    BP Readings from Last 3 Encounters:  06/09/22 120/70  04/18/22 (!) 140/60  04/10/22 (!) 118/56   Wt Readings from Last 3 Encounters:  06/09/22 151 lb 3.2 oz (68.6 kg)  04/18/22 150 lb 6 oz (68.2 kg)  04/10/22 150 lb (68 kg)    Physical Exam Constitutional:      General: She is not in acute distress.    Appearance: She is not diaphoretic.  Cardiovascular:     Rate and Rhythm: Normal rate and regular rhythm.     Heart sounds: Normal heart sounds.  Pulmonary:      Effort: Pulmonary effort is normal.     Breath sounds: Normal breath sounds.  Musculoskeletal:     Comments: No midline spine tenderness, no midline spine step-off, no muscular back tenderness  Skin:    General: Skin is warm and dry.  Neurological:     Mental Status: She is alert.     Comments: 5/5 strength bilateral quads, hamstrings, plantarflexion, and dorsiflexion, sensation to light touch intact bilateral lower extremities      Assessment/Plan: Please see individual problem list.  Problem List Items Addressed This Visit     Chronic low back pain (Chronic)    Improving.  She will monitor.      Hyperlipidemia (Chronic)    Well-controlled on most recent check.  She will continue Repatha 140 mg every 14 days.      Hypertension (Chronic)    Well-controlled.  She will continue carvedilol 6.25 mg twice daily, spironolactone 25 mg daily, and Entresto 1 tablet twice daily.  Check BMP today.      Relevant Orders   Basic Metabolic Panel (BMET)   Hypothyroidism (Chronic)    Check TSH.  Continue Levoxyl 75 mcg daily.      Relevant Orders   TSH   Ischemic cardiomyopathy (Chronic)    Some mild swelling for the last few weeks.  Otherwise seems euvolemic.  We will check a BNP.  She will continue Entresto 1 tablet twice daily, spironolactone 25 mg daily, and carvedilol 6.25 mg twice daily.      Relevant Orders   B Nat Peptide   Basic Metabolic Panel (BMET)     Health Maintenance: encouraged to get the RSV vaccine at the pharmacy.   Return in about 1 week (around 06/16/2022) for Nurse visit for flu vaccine and check of blood pressure with comparison of home cuff, 74-monthPCP.   ETommi Rumps MD [place]

## 2022-06-09 NOTE — Assessment & Plan Note (Signed)
Well-controlled on most recent check.  She will continue Repatha 140 mg every 14 days.

## 2022-06-10 LAB — TSH: TSH: 1.22 m[IU]/L (ref 0.40–4.50)

## 2022-06-10 LAB — BRAIN NATRIURETIC PEPTIDE: Brain Natriuretic Peptide: 64 pg/mL

## 2022-06-10 LAB — BASIC METABOLIC PANEL WITH GFR
BUN/Creatinine Ratio: 24 (calc) — ABNORMAL HIGH (ref 6–22)
BUN: 26 mg/dL — ABNORMAL HIGH (ref 7–25)
CO2: 26 mmol/L (ref 20–32)
Calcium: 10.2 mg/dL (ref 8.6–10.4)
Chloride: 105 mmol/L (ref 98–110)
Creat: 1.09 mg/dL — ABNORMAL HIGH (ref 0.60–1.00)
Glucose, Bld: 92 mg/dL (ref 65–99)
Potassium: 4.1 mmol/L (ref 3.5–5.3)
Sodium: 141 mmol/L (ref 135–146)

## 2022-06-13 DIAGNOSIS — J3089 Other allergic rhinitis: Secondary | ICD-10-CM | POA: Diagnosis not present

## 2022-06-13 DIAGNOSIS — J3081 Allergic rhinitis due to animal (cat) (dog) hair and dander: Secondary | ICD-10-CM | POA: Diagnosis not present

## 2022-06-13 DIAGNOSIS — J301 Allergic rhinitis due to pollen: Secondary | ICD-10-CM | POA: Diagnosis not present

## 2022-06-14 ENCOUNTER — Ambulatory Visit
Admission: RE | Admit: 2022-06-14 | Discharge: 2022-06-14 | Disposition: A | Discharge status: Home / Self Care | Payer: PPO | Source: Ambulatory Visit | Attending: Oncology | Admitting: Oncology

## 2022-06-14 DIAGNOSIS — Z1231 Encounter for screening mammogram for malignant neoplasm of breast: Secondary | ICD-10-CM | POA: Insufficient documentation

## 2022-06-14 DIAGNOSIS — D0511 Intraductal carcinoma in situ of right breast: Secondary | ICD-10-CM

## 2022-06-14 DIAGNOSIS — M810 Age-related osteoporosis without current pathological fracture: Secondary | ICD-10-CM | POA: Diagnosis not present

## 2022-06-14 DIAGNOSIS — Z13820 Encounter for screening for osteoporosis: Secondary | ICD-10-CM | POA: Insufficient documentation

## 2022-06-14 DIAGNOSIS — E039 Hypothyroidism, unspecified: Secondary | ICD-10-CM | POA: Insufficient documentation

## 2022-06-14 DIAGNOSIS — Z923 Personal history of irradiation: Secondary | ICD-10-CM | POA: Insufficient documentation

## 2022-06-14 DIAGNOSIS — Z86000 Personal history of in-situ neoplasm of breast: Secondary | ICD-10-CM | POA: Insufficient documentation

## 2022-06-14 DIAGNOSIS — Z780 Asymptomatic menopausal state: Secondary | ICD-10-CM | POA: Insufficient documentation

## 2022-06-15 ENCOUNTER — Ambulatory Visit (INDEPENDENT_AMBULATORY_CARE_PROVIDER_SITE_OTHER): Payer: PPO

## 2022-06-15 VITALS — BP 110/60 | HR 60

## 2022-06-15 DIAGNOSIS — Z23 Encounter for immunization: Secondary | ICD-10-CM | POA: Diagnosis not present

## 2022-06-15 DIAGNOSIS — I10 Essential (primary) hypertension: Secondary | ICD-10-CM | POA: Diagnosis not present

## 2022-06-15 NOTE — Progress Notes (Signed)
Patient here for nurse visit BP check per order from OV 06/09/22.   Patient reports compliance with prescribed BP medications: yes, yes, Carvedilol 6.25 mg BID once daily, Spironolactone 25 mg once daily,Entresto 1 tab my mouth twice daily   Last dose of BP medication: This morning '@11'$ :30 am  BP Readings from Last 3 Encounters:  06/15/22 110/60  06/09/22 120/70  04/18/22 (!) 140/60   Pulse Readings from Last 3 Encounters:  06/15/22 60  06/09/22 74  04/18/22 (!) 54  Pt arrived for BP check manually & with pt's machine automatically.  I had pt sit & relax for about 10-15 mins due to first readings being elevated. Went back & retook BP in L arm once more.  2nd reading: L arm (Manually):110/60 L arm (Auto):101/56 PR:60    PR:59 O2:98  Informed pt that readings would be sent to PCP for review & CMA would be reaching out to pt in regards to next steps & or change in therapy.   Patient verbalized understanding of instructions.   Elita Quick Dimas,CMA

## 2022-06-20 DIAGNOSIS — J301 Allergic rhinitis due to pollen: Secondary | ICD-10-CM | POA: Diagnosis not present

## 2022-06-20 DIAGNOSIS — J3081 Allergic rhinitis due to animal (cat) (dog) hair and dander: Secondary | ICD-10-CM | POA: Diagnosis not present

## 2022-06-20 DIAGNOSIS — J3089 Other allergic rhinitis: Secondary | ICD-10-CM | POA: Diagnosis not present

## 2022-06-22 DIAGNOSIS — J3081 Allergic rhinitis due to animal (cat) (dog) hair and dander: Secondary | ICD-10-CM | POA: Diagnosis not present

## 2022-06-22 DIAGNOSIS — J3089 Other allergic rhinitis: Secondary | ICD-10-CM | POA: Diagnosis not present

## 2022-06-22 DIAGNOSIS — J301 Allergic rhinitis due to pollen: Secondary | ICD-10-CM | POA: Diagnosis not present

## 2022-06-27 DIAGNOSIS — J3081 Allergic rhinitis due to animal (cat) (dog) hair and dander: Secondary | ICD-10-CM | POA: Diagnosis not present

## 2022-06-27 DIAGNOSIS — J301 Allergic rhinitis due to pollen: Secondary | ICD-10-CM | POA: Diagnosis not present

## 2022-06-27 DIAGNOSIS — D0581 Other specified type of carcinoma in situ of right breast: Secondary | ICD-10-CM | POA: Diagnosis not present

## 2022-06-27 DIAGNOSIS — J3089 Other allergic rhinitis: Secondary | ICD-10-CM | POA: Diagnosis not present

## 2022-06-28 ENCOUNTER — Other Ambulatory Visit (INDEPENDENT_AMBULATORY_CARE_PROVIDER_SITE_OTHER): Payer: Self-pay | Admitting: Vascular Surgery

## 2022-06-28 DIAGNOSIS — I10 Essential (primary) hypertension: Secondary | ICD-10-CM

## 2022-07-04 ENCOUNTER — Ambulatory Visit (INDEPENDENT_AMBULATORY_CARE_PROVIDER_SITE_OTHER): Payer: PPO | Admitting: Vascular Surgery

## 2022-07-04 ENCOUNTER — Encounter (INDEPENDENT_AMBULATORY_CARE_PROVIDER_SITE_OTHER): Payer: Self-pay | Admitting: Vascular Surgery

## 2022-07-04 ENCOUNTER — Ambulatory Visit (INDEPENDENT_AMBULATORY_CARE_PROVIDER_SITE_OTHER): Payer: PPO

## 2022-07-04 VITALS — BP 113/53 | HR 55 | Resp 16 | Wt 152.0 lb

## 2022-07-04 DIAGNOSIS — J301 Allergic rhinitis due to pollen: Secondary | ICD-10-CM | POA: Diagnosis not present

## 2022-07-04 DIAGNOSIS — J3081 Allergic rhinitis due to animal (cat) (dog) hair and dander: Secondary | ICD-10-CM | POA: Diagnosis not present

## 2022-07-04 DIAGNOSIS — E782 Mixed hyperlipidemia: Secondary | ICD-10-CM | POA: Diagnosis not present

## 2022-07-04 DIAGNOSIS — I6529 Occlusion and stenosis of unspecified carotid artery: Secondary | ICD-10-CM | POA: Insufficient documentation

## 2022-07-04 DIAGNOSIS — N1831 Chronic kidney disease, stage 3a: Secondary | ICD-10-CM

## 2022-07-04 DIAGNOSIS — I6523 Occlusion and stenosis of bilateral carotid arteries: Secondary | ICD-10-CM | POA: Diagnosis not present

## 2022-07-04 DIAGNOSIS — I10 Essential (primary) hypertension: Secondary | ICD-10-CM

## 2022-07-04 DIAGNOSIS — J3089 Other allergic rhinitis: Secondary | ICD-10-CM | POA: Diagnosis not present

## 2022-07-04 NOTE — Assessment & Plan Note (Signed)
We performed a renal artery duplex today which showed no obvious hemodynamically significant stenosis in either renal artery.  Both kidney sizes were a little on the small side at 8.7 cm on the right and 9.4 cm on the left indicative of chronic kidney disease.  It does not appear as if any renal artery intervention would be of benefit.  Avoid contrast unless absolutely necessary.

## 2022-07-04 NOTE — Assessment & Plan Note (Signed)
lipid control important in reducing the progression of atherosclerotic disease. Continue Repatha. Intolerant to statins.

## 2022-07-04 NOTE — Assessment & Plan Note (Signed)
Had a CTA of the carotid arteries earlier this year which I have reviewed.  Has very mild carotid disease and we can do an ultrasound in the future to follow this and likely would be following this annually.  She has some intracranial disease that I would not recommend any intervention on.

## 2022-07-04 NOTE — Progress Notes (Signed)
Patient ID: Gwendolyn King, female   DOB: Dec 16, 1940, 81 y.o.   MRN: [id number]  Chief Complaint  Patient presents with   New Patient (Initial Visit)    Ref Caryl Bis consult intracranial vascular stenosis    HPI Gwendolyn King is a 81 y.o. female.  I am asked to see the patient by Dr. Caryl Bis for evaluation of atherosclerotic issues potentially contributing to her hypertension and chronic kidney disease as well as a finding on a previous scan of some intracranial vascular disease.  She had a heart attack earlier this year.  She is largely the caregiver for her husband and remains very active and vigorous.  She does not have significant lower extremity claudication symptoms.  Her blood pressure has come under good control with medications over the past several months.  She had longstanding stage III chronic kidney disease.  We performed a renal artery duplex today which showed no obvious hemodynamically significant stenosis in either renal artery.  Both kidney sizes were a little on the small side at 8.7 cm on the right and 9.4 cm on the left indicative of chronic kidney disease.     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   CAD (coronary artery disease)    a. 10/2021 NSTEMI/PCI: LM 2md, LAD 55ost, 99/ (2.5x15 Onyx Frontier DES), D2 90, LCX 80p/m (2.5x30 Onyx Frontier DES), RCA 50ost, 60p, 555mnl iFR throughout). LVEDP 20-2565m.   Cataract    Bil   Chicken pox    Chronic HFrEF (heart failure with reduced ejection fraction) [place]  a. 10/2021 Echo: EF 35-40%, mod asymm LVH w/ sev mid-apical ant, apical HK. GrI DD. Nl RV fxn.   Chronic kidney disease    stage 3   Colon polyp    Constipation    Hyperlipidemia    Hypothyroidism    Ischemic cardiomyopathy    a.  10/2021 Echo: EF 35-40%.   NSTEMI (non-ST elevated myocardial infarction) [place]   Palpitations    over 20 yeras ago   Personal history of radiation therapy    Thyroid disease    Wears hearing aid in both ears     Past Surgical History:  Procedure Laterality Date   ABDOMINAL HYSTERECTOMY  1981   APPENDECTOMY  2012   BREAST BIOPSY Right 05/08/2017   Affirm Bx-DUCTAL CARCINOMA IN SITU (DCIS) WITH ONE FOCUS SUSPICIOUS FOR invasive   BREAST BIOPSY Right 05/17/2017   us US done at DR. 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 2 microscopic foci lateral inferior margins. Patient elected not to proceed to reexcision.   CATARACT EXTRACTION W/PHACO Right 03/27/2022   Procedure: CATARACT EXTRACTION PHACO AND INTRAOCULAR LENS PLACEMENT ([place] VIVITY LENS;  Surgeon: KinEulogio BearD;  Location: [place];  Service: Ophthalmology;  Laterality: Right;  5.49 00:39.1   CATARACT EXTRACTION W/PHACO Left 04/10/2022   Procedure: CATARACT EXTRACTION PHACO AND INTRAOCULAR LENS PLACEMENT (IOC) LEFT VIVITY LENS 3.92 00:32.4;  Surgeon: Eulogio Bear, MD;  Location: [place];  Service: Ophthalmology;  Laterality: Left;   COLONOSCOPY     COLONOSCOPY W/ POLYPECTOMY     CORONARY STENT INTERVENTION N/A 10/25/2021   Procedure: CORONARY STENT INTERVENTION;  Surgeon: Nelva Bush, MD;  Location: [place] [facility];  Service: Cardiovascular;  Laterality: N/A;   CYSTOCELE REPAIR N/A 09/11/2019   Procedure:  ANTERIOR COLPORRHAPHY;  Surgeon: Gae Dry, MD;  Location: [facility];  Service: Gynecology;  Laterality: N/A;   FINGER FRACTURE SURGERY Right    5th   HAMMER TOE SURGERY  09/20/2017   INTRAVASCULAR PRESSURE WIRE/FFR STUDY N/A 10/25/2021   Procedure: INTRAVASCULAR PRESSURE WIRE/FFR STUDY;  Surgeon: Nelva Bush, MD;  Location: [place] [facility];  Service: Cardiovascular;  Laterality: N/A;   JOINT REPLACEMENT     right knee   LEFT HEART CATH AND CORONARY ANGIOGRAPHY N/A 10/25/2021   Procedure: LEFT HEART CATH AND CORONARY ANGIOGRAPHY;  Surgeon: Nelva Bush, MD;  Location: [place] [facility];  Service: Cardiovascular;  Laterality: N/A;   ROTATOR CUFF REPAIR Right    TONSILLECTOMY AND ADENOIDECTOMY  1963   TOTAL KNEE ARTHROPLASTY Right 10/17/2016   Procedure: TOTAL KNEE ARTHROPLASTY;  Surgeon: Melrose Nakayama, MD;  Location: [place];  Service: Orthopedics;  Laterality: Right;   VAGINAL DELIVERY     3     Family History  Problem Relation Age of Onset   Arthritis Mother    Heart disease Mother    Hypertension Mother    Arthritis Father    Heart disease Father    Hypertension Father    Heart attack Father    Stroke Maternal Grandmother    Cancer Maternal Aunt        abdominal?   Hyperlipidemia Maternal Aunt    Diabetes Paternal Aunt    Glaucoma Brother    Hyperlipidemia Other    Diabetes Other    Breast cancer Neg Hx       Social History   Tobacco Use   Smoking status: Never   Smokeless tobacco: Never  Vaping Use   Vaping Use: Never used  Substance Use Topics   Alcohol use: Yes    Comment: wine occ   Drug use: No     Allergies  Allergen Reactions   Singulair [Montelukast] Itching   Lipitor [Atorvastatin] Other (See Comments)    Myalgia, arthralgia   Simvastatin Other (See Comments)    myalgia    Current Outpatient Medications  Medication Sig Dispense Refill   acetaminophen (TYLENOL) 500 MG tablet Take 1,000 mg by mouth at bedtime.     aspirin EC  81 MG tablet Take 81 mg by mouth at bedtime.      Biotin 5 MG TABS Take 5 mg by mouth daily.     calcium-vitamin D (OSCAL WITH D) 500-200 MG-UNIT tablet Take 1 tablet by mouth daily.      carvedilol (COREG) 6.25 MG tablet Take 1 tablet (6.25 mg total) by mouth 2 (two) times daily. 180 tablet 0   cholecalciferol (VITAMIN D) 25 MCG (1000 UNIT) tablet Take 1,000 Units by mouth every evening.     clopidogrel (PLAVIX) 75 MG tablet Take 75 mg by mouth daily.     denosumab (PROLIA) 60 MG/ML SOSY injection Inject  60 mg into the skin every 6 (six) months.     EPINEPHrine 0.3 mg/0.3 mL IJ SOAJ injection Inject 0.3 mg into the muscle as needed for anaphylaxis.     Evolocumab (REPATHA) 140 MG/ML SOSY Inject 140 mg into the skin every 14 (fourteen) days. 6 mL 1   fluticasone (FLONASE) 50 MCG/ACT nasal spray Place into both nostrils daily as needed for allergies or rhinitis.     Krill Oil 500 MG CAPS Take 500 mg by mouth daily.     letrozole (FEMARA) 2.5 MG tablet Take 1 tablet (2.5 mg total) by mouth daily. 90 tablet 1   levocetirizine (XYZAL) 5 MG tablet Take 5 mg by mouth every evening.   3   LEVOXYL 75 MCG tablet Take 1 tablet by mouth once daily 90 tablet 2   Multiple Vitamin (MULTIVITAMIN WITH MINERALS) TABS tablet Take 1 tablet by mouth daily. Adult 50+     polyethylene glycol powder (GLYCOLAX/MIRALAX) powder Take 17 g by mouth daily. With coffee     sacubitril-valsartan (ENTRESTO) 24-26 MG Take 1 tablet by mouth 2 (two) times daily. 180 tablet 3   spironolactone (ALDACTONE) 25 MG tablet Take 1 tablet (25 mg total) by mouth daily. 90 tablet 3   vitamin B-12 (CYANOCOBALAMIN) 1000 MCG tablet Take 1,000 mcg by mouth daily.     No current facility-administered medications for this visit.      REVIEW OF SYSTEMS (Negative unless checked)  Constitutional: []Weight loss  []Fever  []Chills Cardiac: []Chest pain   []Chest pressure   [x]Palpitations   []Shortness of breath when laying flat   []Shortness of  breath at rest   []Shortness of breath with exertion. Vascular:  []Pain in legs with walking   []Pain in legs at rest   []Pain in legs when laying flat   []Claudication   []Pain in feet when walking  []Pain in feet at rest  []Pain in feet when laying flat   []History of DVT   []Phlebitis   []Swelling in legs   []Varicose veins   []Non-healing ulcers Pulmonary:   []Uses home oxygen   []Productive cough   []Hemoptysis   []Wheeze  []COPD   []Asthma Neurologic:  []Dizziness  []Blackouts   []Seizures   []History of stroke   []History of TIA  []Aphasia   []Temporary blindness   []Dysphagia   []Weakness or numbness in arms   []Weakness or numbness in legs Musculoskeletal:  [x]Arthritis   []Joint swelling   []Joint pain   []Low back pain Hematologic:  []Easy bruising  []Easy bleeding   []Hypercoagulable state   []Anemic  []Hepatitis Gastrointestinal:  []Blood in stool   []Vomiting blood  []Gastroesophageal reflux/heartburn   []Abdominal pain Genitourinary:  []Chronic kidney disease   []Difficult urination  []Frequent urination  []Burning with urination   []Hematuria Skin:  []Rashes   []Ulcers   []Wounds Psychological:  []History of anxiety   [] History of major depression.    Physical Exam BP (!) 113/53 (BP Location: Left Arm)   Pulse (!) 55   Resp 16   Wt 152 lb (68.9 kg)   BMI 29.69 kg/m  Gen:  WD/WN, NAD.  Appears younger than stated age Head: [place]/AT, No temporalis wasting.  Ear/Nose/Throat: Hearing grossly intact, nares w/o erythema or drainage, oropharynx w/o Erythema/Exudate Eyes: Conjunctiva clear, sclera non-icteric  Neck: trachea midline.  No JVD.  Pulmonary:  Good air movement, respirations not labored, no use of accessory muscles  Cardiac: RRR, no JVD Vascular:  Vessel Right Left  Radial Palpable Palpable                   Musculoskeletal: M/S 5/5 throughout.  Extremities without ischemic changes.  No deformity or atrophy.  No significant lower extremity edema. Neurologic:  Sensation grossly intact in extremities.  Symmetrical.  Speech is fluent. Motor exam as listed above. Psychiatric: Judgment intact, Mood & affect appropriate for pt's clinical situation. Dermatologic: No rashes or ulcers noted.  No cellulitis or open wounds.    Radiology MM 3D SCREEN BREAST BILATERAL  Result Date: 06/15/2022 CLINICAL DATA:  Screening. EXAM: DIGITAL SCREENING BILATERAL MAMMOGRAM WITH TOMOSYNTHESIS AND CAD TECHNIQUE: Bilateral screening digital craniocaudal and mediolateral oblique mammograms were obtained. Bilateral screening digital breast tomosynthesis was performed. The images were evaluated with computer-aided detection. COMPARISON:  Previous exam(s). ACR Breast Density Category b: There are scattered areas of fibroglandular density. FINDINGS: There are no findings suspicious for malignancy. IMPRESSION: No mammographic evidence of malignancy. A result letter of this screening mammogram will be mailed directly to the patient. RECOMMENDATION: Screening mammogram in one year. (Code:SM-B-01Y) BI-RADS CATEGORY  1: Negative. Electronically Signed   By: Ammie Ferrier M.D.   On: 06/15/2022 15:56   DG Bone Density  Result Date: 06/14/2022 EXAM: DUAL X-RAY ABSORPTIOMETRY (DXA) FOR BONE MINERAL DENSITY IMPRESSION: Your patient Maresa Morash completed a BMD test on 06/14/2022 using the [place] (software version: 14.10) manufactured by [employer]. The following summarizes the results of our evaluation. Technologist::TNB PATIENT BIOGRAPHICAL: Name: Bentlie, Withem Patient ID: [id number] Birth Date: 02-Jan-1941 Height: 60.0 in. Gender: Female Exam Date: 06/14/2022 Weight: 151.2 lbs. Indications: Osteoporotic, Postmenopausal, Height Loss, Hypothyroid, Caucasian, Advanced Age, History of Radiation, Right knee replacement, Hysterectomy, History of Breast Cancer, High Risk Meds Fractures: Treatments: aspirin 81 mg, calcium w/ vit D, Letrozole, LEVOXYL, Prolia, Vitamin D  DENSITOMETRY RESULTS: Site         Region     Measured Date Measured Age WHO Classification Young Adult T-score BMD         %Change vs. Previous Significant Change (*) DualFemur Neck Right 06/14/2024 79.4 Osteopenia -1.6 0.818 g/cm2 -3.4% - DualFemur Neck Right 06/14/2021 78.4 Osteopenia -1.4 0.847 g/cm2 0.5% - DualFemur Neck Right 06/10/2020 77.4 Osteopenia -1.4 0.843 g/cm2 2.4% - DualFemur Neck Right 06/10/2019 76.4 Osteopenia -1.5 0.823 g/cm2 0.7% - DualFemur Neck Right 06/06/2018 75.3 Osteopenia -1.6 0.817 g/cm2 -0.5% - DualFemur Neck Right 05/17/2016 73.3 Osteopenia -1.6 0.821 g/cm2 - - DualFemur Total Mean 06/14/2022 79.4 Normal -0.8 0.911 g/cm2 0.2% - DualFemur Total Mean 06/14/2021 78.4 Normal -0.8 0.909 g/cm2 2.0% - DualFemur Total Mean 06/10/2020 77.4 Normal -0.9 0.891 g/cm2 -0.2% - DualFemur Total Mean 06/10/2019 76.4 Normal -0.9 0.893 g/cm2 0.3% - DualFemur Total Mean 06/06/2018 75.3 Normal -0.9 0.890 g/cm2 -1.4% - DualFemur Total Mean 05/17/2016 73.3 Normal -0.8 0.903 g/cm2 - - Left Forearm Radius 33% 06/14/2022 79.4 Osteoporosis -3.1 0.607 g/cm2 -5.6% Yes Left Forearm Radius 33% 06/14/2021 78.4 Osteoporosis -2.7 0.643 g/cm2 6.3% Yes Left Forearm Radius 33% 06/10/2020 77.4 Osteoporosis -3.1 0.605 g/cm2 -5.9% Yes Left Forearm Radius 33% 06/10/2019 76.4 Osteoporosis -2.7 0.643 g/cm2 -3.5% - Left Forearm Radius 33% 06/06/2018 75.3 Osteopenia -2.4 0.666 g/cm2 -5.1% - Left Forearm Radius 33% 05/17/2016 73.3 Osteopenia -2.0 0.702 g/cm2 - - ASSESSMENT: The BMD measured at Forearm Radius 33% is 0.607 g/cm2 with a T-score of -3.1. This patient is considered osteoporotic according to [facility]) criteria. Compared with prior study, there has been  no significant change in the right hip. Compared with prior study, there has been no significant change in the total mean. Compared with prior study, there has been significant decrease in the left forearm. Lumbar spine was not utilized due to  advanced degenerative changes. Patient is not a candidate for FRAX due to diagnosis of osteoporosis. The scan quality is good. Degenerative changes incidentally noted at LEFT distal radioulnar joint. World Pharmacologist [facility]) criteria for post-menopausal, Caucasian Women: Normal:                   T-score at or above -1 SD Osteopenia/low bone mass: T-score between -1 and -2.5 SD Osteoporosis:             T-score at or below -2.5 SD RECOMMENDATIONS: 1. All patients should optimize calcium and vitamin D intake. 2. Consider FDA-approved medical therapies in postmenopausal women and men aged 80 years and older, based on the following: a. A hip or vertebral(clinical or morphometric) fracture b. T-score < -2.5 at the femoral neck or spine after appropriate evaluation to exclude secondary causes c. Low bone mass (T-score between -1.0 and -2.5 at the femoral neck or spine) and a 10-year probability of a hip fracture > 3% or a 10-year probability of a major osteoporosis-related fracture > 20% based on the [place]-adapted WHO algorithm 3. Clinician judgment and/or patient preferences may indicate treatment for people with 10-year fracture probabilities above or below these levels FOLLOW-UP: People with diagnosed cases of osteoporosis or at high risk for fracture should have regular bone mineral density tests. For patients eligible for Medicare, routine testing is allowed once every 2 years. The testing frequency can be increased to one year for patients who have rapidly progressing disease, those who are receiving or discontinuing medical therapy to restore bone mass, or have additional risk factors. I have reviewed this report, and agree with the above findings. Mark A. Thornton Papas, M.D. [facility] Electronically Signed   By: Lavonia Dana M.D.   On: 06/14/2022 11:17    Labs Recent Results (from the past 2160 hour(s))  Basic Metabolic Panel (BMET)     Status: Abnormal   Collection Time: 05/19/22 10:58 AM   Result Value Ref Range   Sodium 142 135 - 145 mEq/L   Potassium 4.2 3.5 - 5.1 mEq/L   Chloride 105 96 - 112 mEq/L   CO2 31 19 - 32 mEq/L   Glucose, Bld 98 70 - 99 mg/dL   BUN 23 6 - 23 mg/dL   Creatinine, Ser 1.12 0.40 - 1.20 mg/dL   GFR 46.82 (L) >60.00 mL/min    Comment: Calculated using the CKD-EPI Creatinine Equation (2021)   Calcium 10.3 8.4 - 10.5 mg/dL  B Nat Peptide     Status: None   Collection Time: 06/09/22  2:24 PM  Result Value Ref Range   Brain Natriuretic Peptide 64 <100 pg/mL    Comment: . BNP levels increase with age in the general population with the highest values seen in individuals greater than 61 years of age. Reference: J. Am. [place] Ar. Cardiol. 2002; 16:109-604. Marland Kitchen   Basic Metabolic Panel (BMET)     Status: Abnormal   Collection Time: 06/09/22  2:24 PM  Result Value Ref Range   Glucose, Bld 92 65 - 99 mg/dL    Comment: .            Fasting reference interval .    BUN 26 (H) 7 - 25 mg/dL   Creat  1.09 (H) 0.60 - 1.00 mg/dL   BUN/Creatinine Ratio 24 (H) 6 - 22 (calc)   Sodium 141 135 - 146 mmol/L   Potassium 4.1 3.5 - 5.3 mmol/L   Chloride 105 98 - 110 mmol/L   CO2 26 20 - 32 mmol/L   Calcium 10.2 8.6 - 10.4 mg/dL  TSH     Status: None   Collection Time: 06/09/22  2:24 PM  Result Value Ref Range   TSH 1.22 0.40 - 4.50 mIU/L    Assessment/Plan:  Hyperlipidemia lipid control important in reducing the progression of atherosclerotic disease. Continue Repatha. Intolerant to statins.    Hypertension We performed a renal artery duplex today which showed no obvious hemodynamically significant stenosis in either renal artery.  Both kidney sizes were a little on the small side at 8.7 cm on the right and 9.4 cm on the left indicative of chronic kidney disease.  I do not think a renal angiogram at this point is likely to be of significant yield.  I would also like to avoid the contrast that would be involved with that with her chronic kidney disease.  If her  blood pressure becomes more difficult to control, this could be reconsidered.  Carotid stenosis Had a CTA of the carotid arteries earlier this year which I have reviewed.  Has very mild carotid disease and we can do an ultrasound in the future to follow this and likely would be following this annually.  She has some intracranial disease that I would not recommend any intervention on.  CKD (chronic kidney disease) stage 3, GFR 30-59 ml/min (HCC) We performed a renal artery duplex today which showed no obvious hemodynamically significant stenosis in either renal artery.  Both kidney sizes were a little on the small side at 8.7 cm on the right and 9.4 cm on the left indicative of chronic kidney disease.  It does not appear as if any renal artery intervention would be of benefit.  Avoid contrast unless absolutely necessary.      Leotis Pain 07/04/2022, 10:54 AM   This note was created with Dragon medical transcription system.  Any errors from dictation are unintentional.

## 2022-07-04 NOTE — Assessment & Plan Note (Signed)
We performed a renal artery duplex today which showed no obvious hemodynamically significant stenosis in either renal artery.  Both kidney sizes were a little on the small side at 8.7 cm on the right and 9.4 cm on the left indicative of chronic kidney disease.  I do not think a renal angiogram at this point is likely to be of significant yield.  I would also like to avoid the contrast that would be involved with that with her chronic kidney disease.  If her blood pressure becomes more difficult to control, this could be reconsidered.

## 2022-07-05 ENCOUNTER — Other Ambulatory Visit: Payer: Self-pay | Admitting: *Deleted

## 2022-07-05 DIAGNOSIS — D0511 Intraductal carcinoma in situ of right breast: Secondary | ICD-10-CM

## 2022-07-06 ENCOUNTER — Inpatient Hospital Stay: Payer: PPO

## 2022-07-06 ENCOUNTER — Encounter: Payer: Self-pay | Admitting: Oncology

## 2022-07-06 ENCOUNTER — Inpatient Hospital Stay (HOSPITAL_BASED_OUTPATIENT_CLINIC_OR_DEPARTMENT_OTHER): Payer: PPO | Admitting: Oncology

## 2022-07-06 ENCOUNTER — Inpatient Hospital Stay: Payer: PPO | Attending: Oncology

## 2022-07-06 VITALS — BP 111/35 | HR 56 | Temp 97.5°F | Resp 18 | Wt 152.7 lb

## 2022-07-06 DIAGNOSIS — D0511 Intraductal carcinoma in situ of right breast: Secondary | ICD-10-CM | POA: Insufficient documentation

## 2022-07-06 DIAGNOSIS — M810 Age-related osteoporosis without current pathological fracture: Secondary | ICD-10-CM | POA: Diagnosis present

## 2022-07-06 LAB — CBC WITH DIFFERENTIAL/PLATELET
Abs Immature Granulocytes: 0.02 10*3/uL (ref 0.00–0.07)
Basophils Absolute: 0 10*3/uL (ref 0.0–0.1)
Basophils Relative: 1 %
Eosinophils Absolute: 0.1 10*3/uL (ref 0.0–0.5)
Eosinophils Relative: 3 %
HCT: 40.9 % (ref 36.0–46.0)
Hemoglobin: 13.8 g/dL (ref 12.0–15.0)
Immature Granulocytes: 0 %
Lymphocytes Relative: 36 %
Lymphs Abs: 1.9 10*3/uL (ref 0.7–4.0)
MCH: 29.8 pg (ref 26.0–34.0)
MCHC: 33.7 g/dL (ref 30.0–36.0)
MCV: 88.3 fL (ref 80.0–100.0)
Monocytes Absolute: 0.5 10*3/uL (ref 0.1–1.0)
Monocytes Relative: 10 %
Neutro Abs: 2.7 10*3/uL (ref 1.7–7.7)
Neutrophils Relative %: 50 %
Platelets: 98 10*3/uL — ABNORMAL LOW (ref 150–400)
RBC: 4.63 MIL/uL (ref 3.87–5.11)
RDW: 13.2 % (ref 11.5–15.5)
WBC: 5.2 10*3/uL (ref 4.0–10.5)
nRBC: 0 % (ref 0.0–0.2)

## 2022-07-06 LAB — COMPREHENSIVE METABOLIC PANEL WITH GFR
ALT: 16 U/L (ref 0–44)
AST: 21 U/L (ref 15–41)
Albumin: 4 g/dL (ref 3.5–5.0)
Alkaline Phosphatase: 28 U/L — ABNORMAL LOW (ref 38–126)
Anion gap: 6 (ref 5–15)
BUN: 28 mg/dL — ABNORMAL HIGH (ref 8–23)
CO2: 27 mmol/L (ref 22–32)
Calcium: 9.9 mg/dL (ref 8.9–10.3)
Chloride: 106 mmol/L (ref 98–111)
Creatinine, Ser: 1.16 mg/dL — ABNORMAL HIGH (ref 0.44–1.00)
GFR, Estimated: 48 mL/min — ABNORMAL LOW
Glucose, Bld: 72 mg/dL (ref 70–99)
Potassium: 4 mmol/L (ref 3.5–5.1)
Sodium: 139 mmol/L (ref 135–145)
Total Bilirubin: 0.9 mg/dL (ref 0.3–1.2)
Total Protein: 6.5 g/dL (ref 6.5–8.1)

## 2022-07-06 MED ORDER — DENOSUMAB 60 MG/ML ~~LOC~~ SOSY
60.0000 mg | PREFILLED_SYRINGE | Freq: Once | SUBCUTANEOUS | Status: AC
  Administered 2022-07-06: 60 mg via SUBCUTANEOUS

## 2022-07-06 NOTE — Progress Notes (Signed)
Pt will like to discuss her [facility] if she has to continue taking it.

## 2022-07-06 NOTE — Progress Notes (Signed)
[facility]  [phone] [phone]  ID: Gwendolyn King OB: October 16, 1940  MR#: [id number]  UXN#:[id number]  Patient Care Team: Leone Haven, MD as PCP - General (Family Medicine) Rockey Situ Kathlene November, MD as PCP - Cardiology (Cardiology) Minna Merritts, MD as Consulting Physician (Cardiology) Bary Castilla, Forest Gleason, MD (General Surgery)   CHIEF COMPLAINT: DCIS, right breast.  INTERVAL HISTORY: Patient returns to clinic today for routine 56-monthevaluation and continuation of Prolia.  She continues to feel well and remains asymptomatic.  She is tolerating letrozole without significant side effects. She has no neurologic complaints.  She denies any recent fevers or illnesses.  She has a good appetite and denies weight loss. She denies any chest pain, shortness of breath, cough, or hemoptysis.  She denies any nausea, vomiting, constipation, or diarrhea.  She has no urinary complaints.  Patient offers no specific complaints today.  REVIEW OF SYSTEMS:   Review of Systems  Constitutional: Negative.  Negative for diaphoresis, fever, malaise/fatigue and weight loss.  Respiratory: Negative.  Negative for cough and shortness of breath.   Cardiovascular: Negative.  Negative for chest pain and leg swelling.  Gastrointestinal: Negative.  Negative for abdominal pain and constipation.  Genitourinary: Negative.  Negative for dysuria.  Musculoskeletal: Negative.  Negative for back pain.  Skin: Negative.  Negative for itching and rash.  Neurological: Negative.  Negative for sensory change, focal weakness and weakness.  Psychiatric/Behavioral: Negative.  The patient is not nervous/anxious.     As per HPI. Otherwise, a complete review of systems is negative.  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   CAD (coronary artery disease)    a. 10/2021 NSTEMI/PCI: LM 381m, LAD 55ost, 99/ (2.5x15 Onyx Frontier DES), D2 90, LCX 80p/m (2.5x30 Onyx Frontier DES), RCA 50ost, 60p, 5061ml iFR throughout). LVEDP 20-16m34m   Cataract    Bil   Chicken pox    Chronic HFrEF (heart failure with reduced ejection fraction) (HCC)[place] a. 10/2021 Echo: EF 35-40%, mod asymm LVH w/ sev mid-apical ant, apical HK. GrI DD. Nl RV fxn.   Chronic kidney disease    stage 3   Colon polyp    Constipation    Hyperlipidemia    Hypothyroidism    Ischemic cardiomyopathy    a. 10/2021 Echo: EF 35-40%.   NSTEMI (non-ST elevated myocardial infarction) (HCC)[place]/01/2022   Palpitations    over 20 yeras ago   Personal history of radiation therapy    Thyroid disease    Wears hearing aid in both ears     PAST SURGICAL HISTORY: Past Surgical History:  Procedure Laterality Date   ABDOMINAL HYSTERECTOMY  1981   APPENDECTOMY  2012   BREAST BIOPSY Right 05/08/2017   Affirm Bx-DUCTAL CARCINOMA IN SITU (DCIS) WITH ONE FOCUS SUSPICIOUS FOR invasive   BREAST BIOPSY Right 05/17/2017   us bKoreadone at DR. Brynetts office, papillary carcinoma   BREAST BIOPSY Right 06/18/2019   Stereo bc calcs COMPATIBLE WITH PRIOR SURGICAL SITE CHANGES   BREAST LUMPECTOMY Right 06/07/2017   DUCTAL CARCINOMA IN SITU (DCIS) with microinvasion at 7:00 5 cmfn   BREAST LUMPECTOMY Right 06/07/2017   PAPILLARY LESION CONSISTENT WITH  PAPILLARY CARCINOMA at retroaerolar 7:00 1.5 cmfn   BREAST LUMPECTOMY WITH SENTINEL LYMPH NODE BIOPSY Right 06/07/2017   Wide excision of 2 foci of high-grade DCIS, microinvasion noted only on core biopsy. 2 microscopic foci lateral inferior margins. Patient elected not to proceed to reexcision.   CATARACT EXTRACTION W/PHACO Right 03/27/2022   Procedure: CATARACT EXTRACTION PHACO AND INTRAOCULAR LENS PLACEMENT ([place]) RIGHT VIVITY LENS;  Surgeon: Eulogio Bear, MD;  Location: [facility];  Service: Ophthalmology;  Laterality: Right;  5.49 00:39.1   CATARACT EXTRACTION W/PHACO Left 04/10/2022   Procedure: CATARACT EXTRACTION PHACO AND INTRAOCULAR LENS PLACEMENT ([facility]) LEFT VIVITY LENS 3.92 00:32.4;  Surgeon: Eulogio Bear, MD;  Location: [facility];  Service: Ophthalmology;  Laterality: Left;   COLONOSCOPY     COLONOSCOPY W/ POLYPECTOMY     CORONARY STENT INTERVENTION N/A 10/25/2021   Procedure: CORONARY STENT INTERVENTION;  Surgeon: Nelva Bush, MD;  Location: [facility];  Service: Cardiovascular;  Laterality: N/A;   CYSTOCELE REPAIR N/A 09/11/2019   Procedure: ANTERIOR COLPORRHAPHY;  Surgeon: Gae Dry, MD;  Location: [facility];  Service: Gynecology;  Laterality: N/A;   FINGER FRACTURE SURGERY Right    5th   HAMMER TOE SURGERY  09/20/2017   INTRAVASCULAR PRESSURE WIRE/FFR STUDY N/A 10/25/2021   Procedure: INTRAVASCULAR PRESSURE WIRE/FFR STUDY;  Surgeon: Nelva Bush, MD;  Location: [facility];  Service: Cardiovascular;  Laterality: N/A;   JOINT REPLACEMENT     right knee   LEFT HEART CATH AND CORONARY ANGIOGRAPHY N/A 10/25/2021   Procedure: LEFT HEART CATH AND CORONARY ANGIOGRAPHY;  Surgeon: Nelva Bush, MD;  Location: [facility];  Service: Cardiovascular;  Laterality: N/A;   ROTATOR CUFF REPAIR Right    TONSILLECTOMY AND ADENOIDECTOMY  1963   TOTAL KNEE ARTHROPLASTY Right 10/17/2016   Procedure: TOTAL KNEE ARTHROPLASTY;  Surgeon: Melrose Nakayama, MD;  Location: [facility];  Service: Orthopedics;  Laterality: Right;   VAGINAL DELIVERY     3    FAMILY HISTORY: Family History  Problem Relation Age of Onset   Arthritis Mother    Heart disease Mother    Hypertension Mother    Arthritis Father    Heart disease Father    Hypertension Father    Heart attack Father    Stroke Maternal Grandmother    Cancer Maternal Aunt        abdominal?   Hyperlipidemia Maternal Aunt     Diabetes Paternal Aunt    Glaucoma Brother    Hyperlipidemia Other    Diabetes Other    Breast cancer Neg Hx     ADVANCED DIRECTIVES (Y/N):  N  HEALTH MAINTENANCE: Social History   Tobacco Use   Smoking status: Never   Smokeless tobacco: Never  Vaping Use   Vaping Use: Never used  Substance Use Topics   Alcohol use: Yes    Comment: wine occ   Drug use: No     Colonoscopy:  PAP:  Bone density:  Lipid panel:  Allergies  Allergen Reactions   Singulair [Montelukast] Itching   Lipitor [Atorvastatin] Other (See Comments)    Myalgia, arthralgia   Simvastatin Other (See Comments)    myalgia    Current Outpatient Medications  Medication Sig Dispense Refill   acetaminophen (TYLENOL) 500 MG tablet Take 1,000 mg by mouth at bedtime.     aspirin EC 81 MG tablet Take 81 mg by mouth at bedtime.      Biotin 5 MG TABS  Take 5 mg by mouth daily.     calcium-vitamin D (OSCAL WITH D) 500-200 MG-UNIT tablet Take 1 tablet by mouth daily.      carvedilol (COREG) 6.25 MG tablet Take 1 tablet (6.25 mg total) by mouth 2 (two) times daily. 180 tablet 0   cholecalciferol (VITAMIN D) 25 MCG (1000 UNIT) tablet Take 1,000 Units by mouth every evening.     clopidogrel (PLAVIX) 75 MG tablet Take 75 mg by mouth daily.     denosumab (PROLIA) 60 MG/ML SOSY injection Inject 60 mg into the skin every 6 (six) months.     Evolocumab (REPATHA) 140 MG/ML SOSY Inject 140 mg into the skin every 14 (fourteen) days. 6 mL 1   fluticasone (FLONASE) 50 MCG/ACT nasal spray Place into both nostrils daily as needed for allergies or rhinitis.     Krill Oil 500 MG CAPS Take 500 mg by mouth daily.     letrozole (FEMARA) 2.5 MG tablet Take 1 tablet (2.5 mg total) by mouth daily. 90 tablet 1   levocetirizine (XYZAL) 5 MG tablet Take 5 mg by mouth every evening.   3   LEVOXYL 75 MCG tablet Take 1 tablet by mouth once daily 90 tablet 2   Multiple Vitamin (MULTIVITAMIN WITH MINERALS) TABS tablet Take 1 tablet by mouth  daily. Adult 50+     polyethylene glycol powder (GLYCOLAX/MIRALAX) powder Take 17 g by mouth daily. With coffee     sacubitril-valsartan (ENTRESTO) 24-26 MG Take 1 tablet by mouth 2 (two) times daily. 180 tablet 3   spironolactone (ALDACTONE) 25 MG tablet Take 1 tablet (25 mg total) by mouth daily. 90 tablet 3   vitamin B-12 (CYANOCOBALAMIN) 1000 MCG tablet Take 1,000 mcg by mouth daily.     EPINEPHrine 0.3 mg/0.3 mL IJ SOAJ injection Inject 0.3 mg into the muscle as needed for anaphylaxis. (Patient not taking: Reported on 07/06/2022)     No current facility-administered medications for this visit.    OBJECTIVE: Vitals:   07/06/22 1059  BP: (!) 111/35  Pulse: (!) 56  Resp: 18  Temp: (!) 97.5 F (36.4 C)  SpO2: 99%     Body mass index is 29.82 kg/m.    ECOG FS:0 - Asymptomatic  General: Well-developed, well-nourished, no acute distress. Eyes: Pink conjunctiva, anicteric sclera. HEENT: Normocephalic, moist mucous membranes. Breast: Exam deferred today. Lungs: No audible wheezing or coughing. Heart: Regular rate and rhythm. Abdomen: Soft, nontender, no obvious distention. Musculoskeletal: No edema, cyanosis, or clubbing. Neuro: Alert, answering all questions appropriately. Cranial nerves grossly intact. Skin: No rashes or petechiae noted. Psych: Normal affect.  LAB RESULTS:  Lab Results  Component Value Date   NA 139 07/06/2022   K 4.0 07/06/2022   CL 106 07/06/2022   CO2 27 07/06/2022   GLUCOSE 72 07/06/2022   BUN 28 (H) 07/06/2022   CREATININE 1.16 (H) 07/06/2022   CALCIUM 9.9 07/06/2022   PROT 6.5 07/06/2022   ALBUMIN 4.0 07/06/2022   AST 21 07/06/2022   ALT 16 07/06/2022   ALKPHOS 28 (L) 07/06/2022   BILITOT 0.9 07/06/2022   GFRNONAA 48 (L) 07/06/2022   GFRAA 49 (L) 06/01/2017    Lab Results  Component Value Date   WBC 5.2 07/06/2022   NEUTROABS 2.7 07/06/2022   HGB 13.8 07/06/2022   HCT 40.9 07/06/2022   MCV 88.3 07/06/2022   PLT 98 (L) 07/06/2022      STUDIES: MM 3D SCREEN BREAST BILATERAL  Result Date: 06/15/2022 CLINICAL DATA:  Screening. EXAM: DIGITAL SCREENING BILATERAL MAMMOGRAM WITH TOMOSYNTHESIS AND CAD TECHNIQUE: Bilateral screening digital craniocaudal and mediolateral oblique mammograms were obtained. Bilateral screening digital breast tomosynthesis was performed. The images were evaluated with computer-aided detection. COMPARISON:  Previous exam(s). ACR Breast Density Category b: There are scattered areas of fibroglandular density. FINDINGS: There are no findings suspicious for malignancy. IMPRESSION: No mammographic evidence of malignancy. A result letter of this screening mammogram will be mailed directly to the patient. RECOMMENDATION: Screening mammogram in one year. (Code:SM-B-01Y) BI-RADS CATEGORY  1: Negative. Electronically Signed   By: Ammie Ferrier M.D.   On: 06/15/2022 15:56   DG Bone Density  Result Date: 06/14/2022 EXAM: DUAL X-RAY ABSORPTIOMETRY (DXA) FOR BONE MINERAL DENSITY IMPRESSION: Your patient Darcee Dekker completed a BMD test on 06/14/2022 using the [employer] (software version: 14.10) manufactured by [employer]. The following summarizes the results of our evaluation. Technologist::TNB PATIENT BIOGRAPHICAL: Name: Gwendolyn King, Gwendolyn King Patient ID: [id number] Birth Date: [birth year] Height: 60.0 in. Gender: Female Exam Date: 06/14/2022 Weight: 151.2 lbs. Indications: Osteoporotic, Postmenopausal, Height Loss, Hypothyroid, Caucasian, Advanced Age, History of Radiation, Right knee replacement, Hysterectomy, History of Breast Cancer, High Risk Meds Fractures: Treatments: aspirin 81 mg, calcium w/ vit D, Letrozole, LEVOXYL, Prolia, Vitamin D DENSITOMETRY RESULTS: Site         Region     Measured Date Measured Age WHO Classification Young Adult T-score BMD         %Change vs. Previous Significant Change (*) DualFemur Neck Right 06/14/2024 79.4 Osteopenia -1.6 0.818 g/cm2 -3.4% - DualFemur Neck Right  06/14/2021 78.4 Osteopenia -1.4 0.847 g/cm2 0.5% - DualFemur Neck Right 06/10/2020 77.4 Osteopenia -1.4 0.843 g/cm2 2.4% - DualFemur Neck Right 06/10/2019 76.4 Osteopenia -1.5 0.823 g/cm2 0.7% - DualFemur Neck Right 06/06/2018 75.3 Osteopenia -1.6 0.817 g/cm2 -0.5% - DualFemur Neck Right 05/17/2016 73.3 Osteopenia -1.6 0.821 g/cm2 - - DualFemur Total Mean 06/14/2022 79.4 Normal -0.8 0.911 g/cm2 0.2% - DualFemur Total Mean 06/14/2021 78.4 Normal -0.8 0.909 g/cm2 2.0% - DualFemur Total Mean 06/10/2020 77.4 Normal -0.9 0.891 g/cm2 -0.2% - DualFemur Total Mean 06/10/2019 76.4 Normal -0.9 0.893 g/cm2 0.3% - DualFemur Total Mean 06/06/2018 75.3 Normal -0.9 0.890 g/cm2 -1.4% - DualFemur Total Mean 05/17/2016 73.3 Normal -0.8 0.903 g/cm2 - - Left Forearm Radius 33% 06/14/2022 79.4 Osteoporosis -3.1 0.607 g/cm2 -5.6% Yes Left Forearm Radius 33% 06/14/2021 78.4 Osteoporosis -2.7 0.643 g/cm2 6.3% Yes Left Forearm Radius 33% 06/10/2020 77.4 Osteoporosis -3.1 0.605 g/cm2 -5.9% Yes Left Forearm Radius 33% 06/10/2019 76.4 Osteoporosis -2.7 0.643 g/cm2 -3.5% - Left Forearm Radius 33% 06/06/2018 75.3 Osteopenia -2.4 0.666 g/cm2 -5.1% - Left Forearm Radius 33% 05/17/2016 73.3 Osteopenia -2.0 0.702 g/cm2 - - ASSESSMENT: The BMD measured at Forearm Radius 33% is 0.607 g/cm2 with a T-score of -3.1. This patient is considered osteoporotic according to [facility]) criteria. Compared with prior study, there has been no significant change in the right hip. Compared with prior study, there has been no significant change in the total mean. Compared with prior study, there has been significant decrease in the left forearm. Lumbar spine was not utilized due to advanced degenerative changes. Patient is not a candidate for FRAX due to diagnosis of osteoporosis. The scan quality is good. Degenerative changes incidentally noted at LEFT distal radioulnar joint. World Health Organization [facility]) criteria for post-menopausal,  Caucasian Women: Normal:                   T-score at or  above -1 SD Osteopenia/low bone mass: T-score between -1 and -2.5 SD Osteoporosis:             T-score at or below -2.5 SD RECOMMENDATIONS: 1. All patients should optimize calcium and vitamin D intake. 2. Consider FDA-approved medical therapies in postmenopausal women and men aged 73 years and older, based on the following: a. A hip or vertebral(clinical or morphometric) fracture b. T-score < -2.5 at the femoral neck or spine after appropriate evaluation to exclude secondary causes c. Low bone mass (T-score between -1.0 and -2.5 at the femoral neck or spine) and a 10-year probability of a hip fracture > 3% or a 10-year probability of a major osteoporosis-related fracture > 20% based on the [place]-adapted WHO algorithm 3. Clinician judgment and/or patient preferences may indicate treatment for people with 10-year fracture probabilities above or below these levels FOLLOW-UP: People with diagnosed cases of osteoporosis or at high risk for fracture should have regular bone mineral density tests. For patients eligible for Medicare, routine testing is allowed once every 2 years. The testing frequency can be increased to one year for patients who have rapidly progressing disease, those who are receiving or discontinuing medical therapy to restore bone mass, or have additional risk factors. I have reviewed this report, and agree with the above findings. Mark A. Thornton Papas, M.D. [facility] Electronically Signed   By: Lavonia Dana M.D.   On: 06/14/2022 11:17     ASSESSMENT: DCIS, right breast  PLAN:    1. DCIS, right breast: Patient underwent lumpectomy on June 07, 2017.  Final pathology was reviewed and no invasive component was noted, therefore patient did not require chemotherapy.  She completed adjuvant XRT in January 2019.  Because of patient's persistent hot flashes, tamoxifen was discontinued and patient was initiated on letrozole.  Patient  expressed understanding that although tamoxifen is the recommended treatment, there is still significant benefit with aromatase inhibitors. Continue treatment for a total of 5 years completing in January 2024.  Her most recent mammogram on June 14, 2022 was reported as BI-RADS 2.  Repeat in October 2024.  Return to clinic in 6 months for routine evaluation.  2.  Thrombocytopenia: Chronic and unchanged.  Patient's most recent platelet count is 98.   3.  Osteoporosis: Patient's most recent bone mineral density on June 14, 2022 reported T score of -3.1.  This is slightly worse than last year where her T score was -2.7, but unchanged from 2 years ago.  Previously, patient did not receive benefit from Fosamax and was switched to Prolia.  Calcium levels are adequate to proceed with treatment.  Continue calcium and vitamin D supplementation.  Repeat bone mineral density in October 2024.  Return to clinic in 6 months with repeat laboratory work and continuation of treatment.     Patient expressed understanding and was in agreement with this plan. She also understands that She can call clinic at any time with any questions, concerns, or complaints.    Cancer Staging  Ductal carcinoma in situ (DCIS) of right breast Staging form: Breast, AJCC 8th Edition - Clinical stage from 08/22/2017: Stage 0 (cTis (DCIS), cN0, cM0, ER+, PR-, HER2-) - Signed by Lloyd Huger, MD on 08/22/2017 Laterality: Right   Lloyd Huger, MD   07/06/2022 12:59 PM

## 2022-07-18 DIAGNOSIS — J3089 Other allergic rhinitis: Secondary | ICD-10-CM | POA: Diagnosis not present

## 2022-07-18 DIAGNOSIS — J301 Allergic rhinitis due to pollen: Secondary | ICD-10-CM | POA: Diagnosis not present

## 2022-07-18 DIAGNOSIS — J3081 Allergic rhinitis due to animal (cat) (dog) hair and dander: Secondary | ICD-10-CM | POA: Diagnosis not present

## 2022-07-25 ENCOUNTER — Telehealth: Payer: Self-pay | Admitting: Family Medicine

## 2022-07-25 DIAGNOSIS — J3081 Allergic rhinitis due to animal (cat) (dog) hair and dander: Secondary | ICD-10-CM | POA: Diagnosis not present

## 2022-07-25 DIAGNOSIS — J3089 Other allergic rhinitis: Secondary | ICD-10-CM | POA: Diagnosis not present

## 2022-07-25 DIAGNOSIS — E782 Mixed hyperlipidemia: Secondary | ICD-10-CM

## 2022-07-25 DIAGNOSIS — I639 Cerebral infarction, unspecified: Secondary | ICD-10-CM

## 2022-07-25 DIAGNOSIS — J301 Allergic rhinitis due to pollen: Secondary | ICD-10-CM | POA: Diagnosis not present

## 2022-07-25 MED ORDER — REPATHA 140 MG/ML ~~LOC~~ SOSY: 140.0000 mg | PREFILLED_SYRINGE | SUBCUTANEOUS | 1 refills | Status: DC

## 2022-07-25 NOTE — Telephone Encounter (Signed)
Patient is requesting a refill on her Evolocumab (REPATHA) 140 MG/ML SOSY, she is almost out. She states that this medication needs a Prior Auth.

## 2022-07-30 NOTE — Telephone Encounter (Signed)
Rx refill sent on 07/25/22  PA initiated on [place] (Key: [id number])

## 2022-08-01 DIAGNOSIS — J301 Allergic rhinitis due to pollen: Secondary | ICD-10-CM | POA: Diagnosis not present

## 2022-08-01 DIAGNOSIS — J3081 Allergic rhinitis due to animal (cat) (dog) hair and dander: Secondary | ICD-10-CM | POA: Diagnosis not present

## 2022-08-01 DIAGNOSIS — I509 Heart failure, unspecified: Secondary | ICD-10-CM | POA: Diagnosis not present

## 2022-08-01 DIAGNOSIS — Z6828 Body mass index (BMI) 28.0-28.9, adult: Secondary | ICD-10-CM | POA: Diagnosis not present

## 2022-08-01 DIAGNOSIS — J3089 Other allergic rhinitis: Secondary | ICD-10-CM | POA: Diagnosis not present

## 2022-08-01 DIAGNOSIS — E039 Hypothyroidism, unspecified: Secondary | ICD-10-CM | POA: Diagnosis not present

## 2022-08-02 ENCOUNTER — Other Ambulatory Visit (HOSPITAL_COMMUNITY): Payer: Self-pay

## 2022-08-02 ENCOUNTER — Encounter: Payer: Self-pay | Admitting: Oncology

## 2022-08-02 ENCOUNTER — Telehealth: Payer: Self-pay

## 2022-08-02 NOTE — Telephone Encounter (Signed)
Pharmacy Patient Advocate Encounter   Received notification from [facility] that prior authorization for Repatha '140mg'$ /ml is required/requested.  Per Test Claim: copay $175.00, prior authorization not needed  Placed a call to [employer], spoke with pharmacy team and let them know to try to bill under the [employer] advg insurance.

## 2022-08-10 DIAGNOSIS — J301 Allergic rhinitis due to pollen: Secondary | ICD-10-CM | POA: Diagnosis not present

## 2022-08-10 DIAGNOSIS — J3089 Other allergic rhinitis: Secondary | ICD-10-CM | POA: Diagnosis not present

## 2022-08-10 DIAGNOSIS — J3081 Allergic rhinitis due to animal (cat) (dog) hair and dander: Secondary | ICD-10-CM | POA: Diagnosis not present

## 2022-08-11 ENCOUNTER — Other Ambulatory Visit (HOSPITAL_COMMUNITY): Payer: Self-pay

## 2022-08-15 DIAGNOSIS — J3081 Allergic rhinitis due to animal (cat) (dog) hair and dander: Secondary | ICD-10-CM | POA: Diagnosis not present

## 2022-08-15 DIAGNOSIS — J3089 Other allergic rhinitis: Secondary | ICD-10-CM | POA: Diagnosis not present

## 2022-08-15 DIAGNOSIS — J301 Allergic rhinitis due to pollen: Secondary | ICD-10-CM | POA: Diagnosis not present

## 2022-08-22 DIAGNOSIS — J3089 Other allergic rhinitis: Secondary | ICD-10-CM | POA: Diagnosis not present

## 2022-08-22 DIAGNOSIS — J301 Allergic rhinitis due to pollen: Secondary | ICD-10-CM | POA: Diagnosis not present

## 2022-08-22 DIAGNOSIS — J3081 Allergic rhinitis due to animal (cat) (dog) hair and dander: Secondary | ICD-10-CM | POA: Diagnosis not present

## 2022-08-29 ENCOUNTER — Encounter: Payer: Self-pay | Admitting: Family Medicine

## 2022-08-29 ENCOUNTER — Ambulatory Visit (INDEPENDENT_AMBULATORY_CARE_PROVIDER_SITE_OTHER): Payer: PPO | Admitting: Nurse Practitioner

## 2022-08-29 ENCOUNTER — Encounter (INDEPENDENT_AMBULATORY_CARE_PROVIDER_SITE_OTHER): Payer: PPO

## 2022-08-31 DIAGNOSIS — H1045 Other chronic allergic conjunctivitis: Secondary | ICD-10-CM | POA: Diagnosis not present

## 2022-08-31 DIAGNOSIS — J3081 Allergic rhinitis due to animal (cat) (dog) hair and dander: Secondary | ICD-10-CM | POA: Diagnosis not present

## 2022-08-31 DIAGNOSIS — J3089 Other allergic rhinitis: Secondary | ICD-10-CM | POA: Diagnosis not present

## 2022-08-31 DIAGNOSIS — J301 Allergic rhinitis due to pollen: Secondary | ICD-10-CM | POA: Diagnosis not present

## 2022-09-04 ENCOUNTER — Ambulatory Visit (INDEPENDENT_AMBULATORY_CARE_PROVIDER_SITE_OTHER): Payer: PPO

## 2022-09-04 VITALS — Ht 60.0 in | Wt 152.0 lb

## 2022-09-04 DIAGNOSIS — Z0000 Encounter for general adult medical examination without abnormal findings: Secondary | ICD-10-CM | POA: Diagnosis not present

## 2022-09-04 NOTE — Patient Instructions (Addendum)
Gwendolyn King , Thank you for taking time to come for your Medicare Wellness Visit. I appreciate your ongoing commitment to your health goals. Please review the following plan we discussed and let me know if I can assist you in the future.   These are the goals we discussed:  Goals       Patient Stated     Maintain Healthy Lifestyle (pt-stated)      Stay active Healthy diet Good water intake        This is a list of the screening recommended for you and due dates:  Health Maintenance  Topic Date Due   DTaP/Tdap/Td vaccine (2 - Td or Tdap) 08/21/2020   COVID-19 Vaccine (8 - 2023-24 season) 09/20/2022*   Zoster (Shingles) Vaccine (2 of 2) 12/04/2022*   Colon Cancer Screening  03/21/2023   Medicare Annual Wellness Visit  09/05/2023   Pneumonia Vaccine  Completed   Flu Shot  Completed   DEXA scan (bone density measurement)  Completed   HPV Vaccine  Aged Out   Hepatitis C Screening: USPSTF Recommendation to screen - Ages 18-79 yo.  Discontinued  *Topic was postponed. The date shown is not the original due date.    Advanced directives: on file  Conditions/risks identified: none new  Next appointment: Follow up in one year for your annual wellness visit    Preventive Care 65 Years and Older, Female Preventive care refers to lifestyle choices and visits with your health care provider that can promote health and wellness. What does preventive care include? A yearly physical exam. This is also called an annual well check. Dental exams once or twice a year. Routine eye exams. Ask your health care provider how often you should have your eyes checked. Personal lifestyle choices, including: Daily care of your teeth and gums. Regular physical activity. Eating a healthy diet. Avoiding tobacco and drug use. Limiting alcohol use. Practicing safe sex. Taking low-dose aspirin every day. Taking vitamin and mineral supplements as recommended by your health care provider. What happens  during an annual well check? The services and screenings done by your health care provider during your annual well check will depend on your age, overall health, lifestyle risk factors, and family history of disease. Counseling  Your health care provider may ask you questions about your: Alcohol use. Tobacco use. Drug use. Emotional well-being. Home and relationship well-being. Sexual activity. Eating habits. History of falls. Memory and ability to understand (cognition). Work and work Statistician. Reproductive health. Screening  You may have the following tests or measurements: Height, weight, and BMI. Blood pressure. Lipid and cholesterol levels. These may be checked every 5 years, or more frequently if you are over 5 years old. Skin check. Lung cancer screening. You may have this screening every year starting at age 65 if you have a 30-pack-year history of smoking and currently smoke or have quit within the past 15 years. Fecal occult blood test (FOBT) of the stool. You may have this test every year starting at age 75. Flexible sigmoidoscopy or colonoscopy. You may have a sigmoidoscopy every 5 years or a colonoscopy every 10 years starting at age 8. Hepatitis C blood test. Hepatitis B blood test. Sexually transmitted disease (STD) testing. Diabetes screening. This is done by checking your blood sugar (glucose) after you have not eaten for a while (fasting). You may have this done every 1-3 years. Bone density scan. This is done to screen for osteoporosis. You may have this done starting at age  65. Mammogram. This may be done every 1-2 years. Talk to your health care provider about how often you should have regular mammograms. Talk with your health care provider about your test results, treatment options, and if necessary, the need for more tests. Vaccines  Your health care provider may recommend certain vaccines, such as: Influenza vaccine. This is recommended every  year. Tetanus, diphtheria, and acellular pertussis (Tdap, Td) vaccine. You may need a Td booster every 10 years. Zoster vaccine. You may need this after age 37. Pneumococcal 13-valent conjugate (PCV13) vaccine. One dose is recommended after age 76. Pneumococcal polysaccharide (PPSV23) vaccine. One dose is recommended after age 23. Talk to your health care provider about which screenings and vaccines you need and how often you need them. This information is not intended to replace advice given to you by your health care provider. Make sure you discuss any questions you have with your health care provider. Document Released: 09/03/2015 Document Revised: 04/26/2016 Document Reviewed: 06/08/2015 Elsevier Interactive Patient Education  2017 [place] Prevention in the Home Falls can cause injuries. They can happen to people of all ages. There are many things you can do to make your home safe and to help prevent falls. What can I do on the outside of my home? Regularly fix the edges of walkways and driveways and fix any cracks. Remove anything that might make you trip as you walk through a door, such as a raised step or threshold. Trim any bushes or trees on the path to your home. Use bright outdoor lighting. Clear any walking paths of anything that might make someone trip, such as rocks or tools. Regularly check to see if handrails are loose or broken. Make sure that both sides of any steps have handrails. Any raised decks and porches should have guardrails on the edges. Have any leaves, snow, or ice cleared regularly. Use sand or salt on walking paths during winter. Clean up any spills in your garage right away. This includes oil or grease spills. What can I do in the bathroom? Use night lights. Install grab bars by the toilet and in the tub and shower. Do not use towel bars as grab bars. Use non-skid mats or decals in the tub or shower. If you need to sit down in the shower, use a  plastic, non-slip stool. Keep the floor dry. Clean up any water that spills on the floor as soon as it happens. Remove soap buildup in the tub or shower regularly. Attach bath mats securely with double-sided non-slip rug tape. Do not have throw rugs and other things on the floor that can make you trip. What can I do in the bedroom? Use night lights. Make sure that you have a light by your bed that is easy to reach. Do not use any sheets or blankets that are too big for your bed. They should not hang down onto the floor. Have a firm chair that has side arms. You can use this for support while you get dressed. Do not have throw rugs and other things on the floor that can make you trip. What can I do in the kitchen? Clean up any spills right away. Avoid walking on wet floors. Keep items that you use a lot in easy-to-reach places. If you need to reach something above you, use a strong step stool that has a grab bar. Keep electrical cords out of the way. Do not use floor polish or wax that makes floors slippery.  If you must use wax, use non-skid floor wax. Do not have throw rugs and other things on the floor that can make you trip. What can I do with my stairs? Do not leave any items on the stairs. Make sure that there are handrails on both sides of the stairs and use them. Fix handrails that are broken or loose. Make sure that handrails are as long as the stairways. Check any carpeting to make sure that it is firmly attached to the stairs. Fix any carpet that is loose or worn. Avoid having throw rugs at the top or bottom of the stairs. If you do have throw rugs, attach them to the floor with carpet tape. Make sure that you have a light switch at the top of the stairs and the bottom of the stairs. If you do not have them, ask someone to add them for you. What else can I do to help prevent falls? Wear shoes that: Do not have high heels. Have rubber bottoms. Are comfortable and fit you  well. Are closed at the toe. Do not wear sandals. If you use a stepladder: Make sure that it is fully opened. Do not climb a closed stepladder. Make sure that both sides of the stepladder are locked into place. Ask someone to hold it for you, if possible. Clearly mark and make sure that you can see: Any grab bars or handrails. First and last steps. Where the edge of each step is. Use tools that help you move around (mobility aids) if they are needed. These include: Canes. Walkers. Scooters. Crutches. Turn on the lights when you go into a dark area. Replace any light bulbs as soon as they burn out. Set up your furniture so you have a clear path. Avoid moving your furniture around. If any of your floors are uneven, fix them. If there are any pets around you, be aware of where they are. Review your medicines with your doctor. Some medicines can make you feel dizzy. This can increase your chance of falling. Ask your doctor what other things that you can do to help prevent falls. This information is not intended to replace advice given to you by your health care provider. Make sure you discuss any questions you have with your health care provider. Document Released: 06/03/2009 Document Revised: 01/13/2016 Document Reviewed: 09/11/2014 Elsevier Interactive Patient Education  2017 [employer].

## 2022-09-04 NOTE — Progress Notes (Signed)
Subjective:   Gwendolyn King is a 82 y.o. female who presents for Medicare Annual (Subsequent) preventive examination.  Review of Systems    No ROS.  Medicare Wellness Virtual Visit.  Visual/audio telehealth visit, UTA vital signs.   See social history for additional risk factors.   Cardiac Risk Factors include: advanced age (>44mn, >>56women);hypertension     Objective:    Today's Vitals   09/04/22 1634  Weight: 152 lb (68.9 kg)  Height: 5' (1.524 m)   Body mass index is 29.69 kg/m.     09/04/2022    4:38 PM 07/06/2022   10:54 AM 04/10/2022    8:53 AM 01/05/2022   10:34 AM 11/10/2021   10:05 AM 10/25/2021    9:11 AM 10/24/2021    8:00 AM  Advanced Directives  Does Patient Have a Medical Advance Directive? Yes Yes Yes Yes No No Yes  Type of [place] will Healthcare Power of [place] will [place] will Healthcare Power of [place] Does patient want to make changes to medical advance directive? No - Patient declined  No - Patient declined   No - Patient declined No - Patient declined  Copy of H[place]and[place]in Chart? Yes - validated most recent copy scanned in chart (See row information)  Yes - validated most recent copy scanned in chart (See row information)   No - copy requested No - copy requested  Would patient like information on creating a medical advance directive?    No - Patient declined No - Patient declined No - Patient declined     Current Medications (verified) Outpatient Encounter Medications as of 09/04/2022  Medication Sig   acetaminophen (TYLENOL) 500 MG tablet Take 1,000 mg by mouth at bedtime.   aspirin EC 81 MG tablet Take 81 mg by mouth at bedtime.    Biotin 5 MG TABS Take 5 mg by mouth daily.   calcium-vitamin D (OSCAL WITH D) 500-200 MG-UNIT tablet Take 1 tablet by mouth daily.     carvedilol (COREG) 6.25 MG tablet Take 1 tablet (6.25 mg total) by mouth 2 (two) times daily.   cholecalciferol (VITAMIN D) 25 MCG (1000 UNIT) tablet Take 1,000 Units by mouth every evening.   clopidogrel (PLAVIX) 75 MG tablet Take 75 mg by mouth daily.   denosumab (PROLIA) 60 MG/ML SOSY injection Inject 60 mg into the skin every 6 (six) months.   EPINEPHrine 0.3 mg/0.3 mL IJ SOAJ injection Inject 0.3 mg into the muscle as needed for anaphylaxis. (Patient not taking: Reported on 07/06/2022)   Evolocumab (REPATHA) 140 MG/ML SOSY Inject 140 mg into the skin every 14 (fourteen) days.   fluticasone (FLONASE) 50 MCG/ACT nasal spray Place into both nostrils daily as needed for allergies or rhinitis.   Krill Oil 500 MG CAPS Take 500 mg by mouth daily.   letrozole (FEMARA) 2.5 MG tablet Take 1 tablet (2.5 mg total) by mouth daily. (Patient not taking: Reported on 09/04/2022)   levocetirizine (XYZAL) 5 MG tablet Take 5 mg by mouth every evening.    LEVOXYL 75 MCG tablet Take 1 tablet by mouth once daily   Multiple Vitamin (MULTIVITAMIN WITH MINERALS) TABS tablet Take 1 tablet by mouth daily. Adult 50+   polyethylene glycol powder (GLYCOLAX/MIRALAX) powder Take 17 g by mouth daily. With coffee   sacubitril-valsartan (ENTRESTO) 24-26 MG Take 1 tablet by mouth  2 (two) times daily.   spironolactone (ALDACTONE) 25 MG tablet Take 1 tablet (25 mg total) by mouth daily.   vitamin B-12 (CYANOCOBALAMIN) 1000 MCG tablet Take 1,000 mcg by mouth daily.   No facility-administered encounter medications on file as of 09/04/2022.    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   CAD (coronary artery  disease)    a. 10/2021 NSTEMI/PCI: LM 1md, LAD 55ost, 99/ (2.5x15 Onyx Frontier DES), D2 90, LCX 80p/m (2.5x30 Onyx Frontier DES), RCA 50ost, 60p, 525mnl iFR throughout). LVEDP 20-2584m.   Cataract    Bil   Chicken pox    Chronic HFrEF (heart failure with reduced ejection fraction) ([facility]CC[facility]dell  a. 10/2021 Echo: EF 35-40%, mod asymm LVH w/ sev mid-apical ant, apical HK. GrI DD. Nl RV fxn.   Chronic kidney disease    stage 3   Colon polyp    Constipation    Hyperlipidemia    Hypothyroidism    Ischemic cardiomyopathy    a. 10/2021 Echo: EF 35-40%.   NSTEMI (non-ST elevated myocardial infarction) (HCCAnchor3/01/2022   Palpitations    over 20 yeras ago   Personal history of radiation therapy    Thyroid disease    Wears hearing aid in both ears    Past Surgical History:  Procedure Laterality Date   ABDOMINAL HYSTERECTOMY  1981   APPENDECTOMY  2012   BREAST BIOPSY Right 05/08/2017   Affirm Bx-DUCTAL CARCINOMA IN SITU (DCIS) WITH ONE FOCUS SUSPICIOUS FOR invasive   BREAST BIOPSY Right 05/17/2017   us US done at DR. 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 2 microscopic foci lateral inferior margins. Patient elected not to proceed to reexcision.   CATARACT EXTRACTION W/PHACO Right 03/27/2022   Procedure: CATARACT EXTRACTION PHACO AND INTRAOCULAR LENS PLACEMENT ([facility]OC[place] CenterIGHT VIVITY LENS;  Surgeon: KinEulogio BearD;  [facility];  Laterality: Right;  5.49 00:39.1   CATARACT  EXTRACTION W/PHACO Left 04/10/2022   Procedure: CATARACT EXTRACTION PHACO AND INTRAOCULAR LENS PLACEMENT (IOC) LEFT VIVITY LENS 3.92 00:32.4;  Surgeon: KinEulogio BearD;  [facility];  Laterality: Left;   COLONOSCOPY     COLONOSCOPY W/ POLYPECTOMY     CORONARY STENT INTERVENTION N/A 10/25/2021   Procedure: CORONARY STENT INTERVENTION;  Surgeon: EndNelva BushD;  [facility];  Service: Cardiovascular;  Laterality: N/A;   CYSTOCELE REPAIR N/A 09/11/2019   Procedure: ANTERIOR COLPORRHAPHY;  Surgeon: HarGae DryD;  [facility];  Service: Gynecology;  Laterality: N/A;   FINGER FRACTURE SURGERY Right    5th   HAMMER TOE SURGERY  09/20/2017   INTRAVASCULAR  PRESSURE WIRE/FFR STUDY N/A 10/25/2021   Procedure: INTRAVASCULAR PRESSURE WIRE/FFR STUDY;  Surgeon: Nelva Bush, MD;  Location: [place] [facility];  Service: Cardiovascular;  Laterality: N/A;   JOINT REPLACEMENT     right knee   LEFT HEART CATH AND CORONARY ANGIOGRAPHY N/A 10/25/2021   Procedure: LEFT HEART CATH AND CORONARY ANGIOGRAPHY;  Surgeon: Nelva Bush, MD;  Location: [place] [facility];  Service: Cardiovascular;  Laterality: N/A;   ROTATOR CUFF REPAIR Right    TONSILLECTOMY AND ADENOIDECTOMY  1963   TOTAL KNEE ARTHROPLASTY Right 10/17/2016   Procedure: TOTAL KNEE ARTHROPLASTY;  Surgeon: Melrose Nakayama, MD;  Location: [place];  Service: Orthopedics;  Laterality: Right;   VAGINAL DELIVERY     3   Family History  Problem Relation Age of Onset   Arthritis Mother    Heart disease Mother    Hypertension Mother    Arthritis Father    Heart disease Father    Hypertension Father    Heart attack Father    Stroke Maternal Grandmother    Cancer Maternal Aunt        abdominal?   Hyperlipidemia Maternal Aunt    Diabetes Paternal Aunt    Glaucoma Brother    Hyperlipidemia Other    Diabetes Other    Breast cancer Neg Hx    Social History    Socioeconomic History   Marital status: Married    Spouse name: Not on file   Number of children: Not on file   Years of education: Not on file   Highest education level: Not on file  Occupational History   Not on file  Tobacco Use   Smoking status: Never   Smokeless tobacco: Never  Vaping Use   Vaping Use: Never used  Substance and Sexual Activity   Alcohol use: Yes    Comment: wine occ   Drug use: No   Sexual activity: Not Currently    Birth control/protection: Post-menopausal  Other Topics Concern   Not on file  Social History Narrative   Lives with husband in [place]. 3 children.      Work - retired, Pharmacist, community, Pharmacist, hospital      Diet - regular, limits sugar, eats small meals   Exercise - Jazzercise 4 days per week   Social Determinants of Health   Financial Resource Strain: Low Risk  (08/31/2021)   Overall Financial Resource Strain (CARDIA)    Difficulty of Paying Living Expenses: Not hard at all  Food Insecurity: No Food Insecurity (09/04/2022)   Hunger Vital Sign    Worried About Running Out of Food in the Last Year: Never true    Ran Out of Food in the Last Year: Never true  Transportation Needs: No Transportation Needs (09/04/2022)   PRAPARE - Hydrologist (Medical): No    Lack of Transportation (Non-Medical): No  Physical Activity: Insufficiently Active (09/04/2022)   Exercise Vital Sign    Days of Exercise per Week: 3 days    Minutes of Exercise per Session: 20 min  Stress: No Stress Concern Present (09/04/2022)   [place]    Feeling of Stress : Not at all  Social Connections: [place] (08/31/2021)   Social Connection and Isolation Panel [NHANES]    Frequency of Communication with Friends and Family: More than three times a week    Frequency of Social Gatherings with Friends and Family: Not on file    Attends  Religious Services: More than 4 times  per year    Active Member of Clubs or Organizations: Yes    Attends Archivist Meetings: Not on file    Marital Status: Married    Tobacco Counseling Counseling given: Not Answered   Clinical Intake:  Pre-visit preparation completed: Yes        Diabetes: No  How often do you need to have someone help you when you read instructions, pamphlets, or other written materials from your doctor or pharmacy?: 1 - Never    Interpreter Needed?: No      Activities of Daily Living    09/04/2022    4:35 PM 04/10/2022    8:46 AM  In your present state of health, do you have any difficulty performing the following activities:  Hearing? 0 0  Vision? 0 0  Difficulty concentrating or making decisions? 0 0  Walking or climbing stairs? 0 0  Dressing or bathing? 0 0  Doing errands, shopping? 0   Preparing Food and eating ? N   Using the Toilet? N   In the past six months, have you accidently leaked urine? N   Do you have problems with loss of bowel control? N   Managing your Medications? N   Managing your Finances? N   Housekeeping or managing your Housekeeping? N     Patient Care Team: Leone Haven, MD as PCP - General (Family Medicine) Rockey Situ Kathlene November, MD as PCP - Cardiology (Cardiology) Minna Merritts, MD as Consulting Physician (Cardiology) Bary Castilla Forest Gleason, MD (General Surgery)  Indicate any recent Medical Services you may have received from other than [facility] providers in the past year (date may be approximate).     Assessment:   This is a routine wellness examination for Dailah.  I connected with  PALMYRA ROGACKI on 09/04/22 by a audio enabled telemedicine application and verified that I am speaking with the correct person using two identifiers.  Patient Location: Home  Provider Location: Office/Clinic  I discussed the limitations of evaluation and management by telemedicine. The patient expressed understanding and agreed to proceed.    Hearing/Vision screen Hearing Screening - Comments:: Patient is able to hear conversational tones without difficulty.  No issues reported.   Vision Screening - Comments:: Followed by [facility] Wears reader lenses Cataracts extracted, bilateral They have regular follow up with the ophthalmologist    Dietary issues and exercise activities discussed: Current Exercise Habits: Home exercise routine, Type of exercise: calisthenics, Time (Minutes): 20, Frequency (Times/Week): 3, Weekly Exercise (Minutes/Week): 60, Intensity: Mild   Goals Addressed               This Visit's Progress     Patient Stated     Maintain Healthy Lifestyle (pt-stated)        Stay active Healthy diet Good water intake       Depression Screen    09/04/2022    4:39 PM 06/09/2022    2:03 PM 03/08/2022    2:55 PM 01/30/2022    2:53 PM 11/23/2021    2:30 PM 08/31/2021    3:07 PM 05/04/2021   11:16 AM  PHQ 2/9 Scores  PHQ - 2 Score 0 0 0 0 0 0 0  PHQ- 9 Score    4 0      Fall Risk    09/04/2022    4:43 PM 06/09/2022    2:03 PM 03/08/2022    2:55 PM 11/10/2021   10:05  AM 08/31/2021    3:11 PM  Fall Risk   Falls in the past year? 0 0 0 0 0  Number falls in past yr: 0 0 0 0 0  Injury with Fall? 0 0 0 0   Risk for fall due to :  No Fall Risks No Fall Risks No Fall Risks   Follow up Falls evaluation completed;Falls prevention discussed Falls evaluation completed Falls evaluation completed Falls evaluation completed;Education provided;Falls prevention discussed Falls evaluation completed    FALL RISK PREVENTION PERTAINING TO THE HOME: Home free of loose throw rugs in walkways, pet beds, electrical cords, etc? Yes  Adequate lighting in your home to reduce risk of falls? Yes   ASSISTIVE DEVICES UTILIZED TO PREVENT FALLS: Life alert? No  Use of a cane, walker or w/c? No   TIMED UP AND GO: Was the test performed? No .   Cognitive Function:    04/18/2018    9:30 AM  MMSE - Mini Mental  State Exam  Orientation to time 5  Orientation to Place 5  Registration 3  Attention/ Calculation 5  Recall 3  Language- name 2 objects 2  Language- repeat 1  Language- follow 3 step command 3  Language- read & follow direction 1  Write a sentence 1  Copy design 1  Total score 30        09/04/2022    4:56 PM 05/16/2019   11:53 AM  6CIT Screen  What Year? 0 points 0 points  What month? 0 points 0 points  What time? 0 points 0 points  Count back from 20 0 points 0 points  Months in reverse 0 points 0 points  Repeat phrase 0 points 0 points  Total Score 0 points 0 points    Immunizations Immunization History  Administered Date(s) Administered   Fluad Quad(high Dose 65+) 05/13/2019, 06/21/2020, 06/03/2021, 06/15/2022   Influenza Split 05/01/2012   Influenza, High Dose Seasonal PF 05/22/2016, 05/11/2017, 05/22/2018, 07/08/2019, 06/22/2020   Influenza,inj,Quad PF,6+ Mos 05/18/2014   Influenza-Unspecified 05/23/2013   Moderna Covid-19 Vaccine Bivalent Booster 62yr & up 06/08/2022   PFIZER Comirnaty(Gray Top)Covid-19 Tri-Sucrose Vaccine 09/02/2020, 01/11/2021   PFIZER(Purple Top)SARS-COV-2 Vaccination 10/02/2019, 10/28/2019, 06/22/2020   Pfizer Covid-19 Vaccine Bivalent Booster 14yr& up 06/15/2021   Pneumococcal Conjugate-13 08/21/2010   Pneumococcal Polysaccharide-23 04/10/2014, 05/09/2018, 07/08/2019, 06/22/2020   Tdap 08/21/2010   Zoster Recombinat (Shingrix) 04/04/2019   Zoster, Live 07/05/2008   TDAP status: Due, Education has been provided regarding the importance of this vaccine. Advised may receive this vaccine at local pharmacy or Health Dept. Aware to provide a copy of the vaccination record if obtained from local pharmacy or Health Dept. Verbalized acceptance and understanding.   Shingrix vaccine- first dose completed. Agrees to update immunization  record.  Screening Tests Health Maintenance  Topic Date Due   DTaP/Tdap/Td (2 - Td or Tdap) 08/21/2020    COVID-19 Vaccine (8 - 2023-24 season) 09/20/2022 (Originally 08/03/2022)   Zoster Vaccines- Shingrix (2 of 2) 12/04/2022 (Originally 05/30/2019)   COLONOSCOPY (Pts 45-4932yrnsurance coverage will need to be confirmed)  03/21/2023   Medicare Annual Wellness (AWV)  09/05/2023   Pneumonia Vaccine 65+31ears old  Completed   INFLUENZA VACCINE  Completed   DEXA SCAN  Completed   HPV VACCINES  Aged Out   Hepatitis C Screening  Discontinued   Health Maintenance Health Maintenance Due  Topic Date Due   DTaP/Tdap/Td (2 - Td or Tdap) 08/21/2020   Lung Cancer Screening: (Low  Dose CT Chest recommended if Age 18-80 years, 30 pack-year currently smoking OR have quit w/in 15years.) does not qualify.   Vision Screening: Recommended annual ophthalmology exams for early detection of glaucoma and other disorders of the eye.  Dental Screening: Recommended annual dental exams for proper oral hygiene.  Community Resource Referral / Chronic Care Management: CRR required this visit?  No   CCM required this visit?  No      Plan:     I have personally reviewed and noted the following in the patient's chart:   Medical and social history Use of alcohol, tobacco or illicit drugs  Current medications and supplements including opioid prescriptions. Patient is not currently taking opioid prescriptions. Functional ability and status Nutritional status Physical activity Advanced directives List of other physicians Hospitalizations, surgeries, and ER visits in previous 12 months Vitals Screenings to include cognitive, depression, and falls Referrals and appointments  In addition, I have reviewed and discussed with patient certain preventive protocols, quality metrics, and best practice recommendations. A written personalized care plan for preventive services as well as general preventive health recommendations were provided to patient.     Leta Jungling, LPN   8/89/1694

## 2022-09-11 ENCOUNTER — Ambulatory Visit (INDEPENDENT_AMBULATORY_CARE_PROVIDER_SITE_OTHER): Payer: PPO | Admitting: Family Medicine

## 2022-09-11 VITALS — BP 104/58 | HR 57 | Temp 98.0°F | Ht 60.0 in | Wt 153.5 lb

## 2022-09-11 DIAGNOSIS — D0511 Intraductal carcinoma in situ of right breast: Secondary | ICD-10-CM

## 2022-09-11 DIAGNOSIS — M810 Age-related osteoporosis without current pathological fracture: Secondary | ICD-10-CM | POA: Diagnosis not present

## 2022-09-11 DIAGNOSIS — I10 Essential (primary) hypertension: Secondary | ICD-10-CM | POA: Diagnosis not present

## 2022-09-11 DIAGNOSIS — I255 Ischemic cardiomyopathy: Secondary | ICD-10-CM

## 2022-09-11 NOTE — Progress Notes (Signed)
Patient seen along with medical student Zada Zhong.  I personally evaluated this patient along with the student, and verified all aspects of the history, physical exam, and medical decision making as documented by the student.  I agree with the student's documentation and have made all necessary edits.  Tryone Kille, MD  

## 2022-09-11 NOTE — Assessment & Plan Note (Signed)
She has been released from her Psychologist, sport and exercise. She will continue to take letrozole 2.'5mg'$  daily until she finishes her medication supply and follow with oncology. After she finishes this bottle of letrozole she will no longer need refills.

## 2022-09-11 NOTE — Progress Notes (Signed)
Tommi Rumps, MD Phone: [phone]  Gwendolyn King is a 82 y.o. female who presents today for f/u.  Hypertension: patient is currently taking spironolactone '25mg'$  daily, sacubitril-valsartan 24-'26mg'$  twice daily, and carvedilol 6.'25mg'$  twice daily. She reports that her blood pressures at home range 110s/60s. She denies any chest pain, shortness of breath, or new edema.  CHF: Patient denies any orthopnea or PND.  Osteoporosis: Patient's last Prolia injection was back in November. She is still being followed by oncology for this.  DCIS: surgeon has released her from his office and after she finishes her letrozole she will no longer need refills. She still is followed by oncology.  Social History   Tobacco Use  Smoking Status Never  Smokeless Tobacco Never    Current Outpatient Medications on File Prior to Visit  Medication Sig Dispense Refill   acetaminophen (TYLENOL) 500 MG tablet Take 500 mg by mouth at bedtime. Takes 1- 2 tablets at bed time.     Biotin 5 MG TABS Take 10 mg by mouth daily.     calcium-vitamin D (OSCAL WITH D) 500-200 MG-UNIT tablet Take 1 tablet by mouth daily.      cholecalciferol (VITAMIN D) 25 MCG (1000 UNIT) tablet Take 1,000 Units by mouth every evening.     clopidogrel (PLAVIX) 75 MG tablet Take 75 mg by mouth daily.     denosumab (PROLIA) 60 MG/ML SOSY injection Inject 60 mg into the skin every 6 (six) months.     Evolocumab (REPATHA) 140 MG/ML SOSY Inject 140 mg into the skin every 14 (fourteen) days. 6 mL 1   levocetirizine (XYZAL) 5 MG tablet Take 5 mg by mouth every evening.   3   LEVOXYL 75 MCG tablet Take 1 tablet by mouth once daily 90 tablet 2   Multiple Vitamin (MULTIVITAMIN WITH MINERALS) TABS tablet Take 1 tablet by mouth daily. Adult 50+     polyethylene glycol powder (GLYCOLAX/MIRALAX) powder Take 17 g by mouth daily. With coffee     sacubitril-valsartan (ENTRESTO) 24-26 MG Take 1 tablet by mouth 2 (two) times daily. 180 tablet 3    spironolactone (ALDACTONE) 25 MG tablet Take 1 tablet (25 mg total) by mouth daily. 90 tablet 3   vitamin B-12 (CYANOCOBALAMIN) 1000 MCG tablet Take 1,000 mcg by mouth daily.     aspirin EC 81 MG tablet Take 81 mg by mouth at bedtime.  (Patient not taking: Reported on 09/11/2022)     carvedilol (COREG) 6.25 MG tablet Take 1 tablet (6.25 mg total) by mouth 2 (two) times daily. 180 tablet 0   Krill Oil 500 MG CAPS Take 500 mg by mouth daily. (Patient not taking: Reported on 09/11/2022)     No current facility-administered medications on file prior to visit.     ROS see history of present illness  Objective  Physical Exam Vitals:   09/11/22 1417  BP: (!) 104/58  Pulse: (!) 57  Temp: 98 F (36.7 C)  SpO2: 96%    BP Readings from Last 3 Encounters:  09/11/22 (!) 104/58  07/06/22 (!) 111/35  07/04/22 (!) 113/53   Wt Readings from Last 3 Encounters:  09/11/22 153 lb 8 oz (69.6 kg)  09/04/22 152 lb (68.9 kg)  07/06/22 152 lb 11.2 oz (69.3 kg)    Physical Exam Constitutional:      Appearance: Normal appearance.  HENT:     Head: Normocephalic and atraumatic.     Right Ear: Tympanic membrane normal.     Left Ear:  Tympanic membrane and ear canal normal.     Ears:     Comments: R ear: checked upon patient request because she was told she had cerumen impaction. On exam there was a small amount of cerumen on the side of the R ear canal but no impaction. Cardiovascular:     Rate and Rhythm: Normal rate and regular rhythm.  Pulmonary:     Effort: Pulmonary effort is normal.     Breath sounds: Normal breath sounds.  Skin:    General: Skin is warm and dry.  Neurological:     Mental Status: She is alert.      Assessment/Plan: Please see individual problem list.  Problem List Items Addressed This Visit     Hypertension - Primary (Chronic)    Well-controlled. Continue spironolactone '25mg'$  daily, sacubitril-valsartan 24-'26mg'$  twice daily, and carvedilol 6.'25mg'$  twice daily.       Ischemic cardiomyopathy (Chronic)    Currently asymptomatic and seems to be euvolemic. Continue spironolactone '25mg'$  daily, sacubitril-valsartan 24-'26mg'$  twice daily, and carvedilol 6.'25mg'$  twice daily.      Ductal carcinoma in situ (DCIS) of right breast    She has been released from her surgeon. She will continue to take letrozole 2.'5mg'$  daily until she finishes her medication supply and follow with oncology. After she finishes this bottle of letrozole she will no longer need refills.      Osteoporosis    Last Prolia injection was back in November, next one is due May 2024. She will continue to follow with oncology for this.        Health Maintenance: advised patient to complete her second shingles shot and a Tdap at her pharmacy.   Return in about 6 months (around 03/12/2023).   Marisa Cyphers, Medical Student [place]

## 2022-09-11 NOTE — Assessment & Plan Note (Signed)
Last Prolia injection was back in November, next one is due May 2024. She will continue to follow with oncology for this.

## 2022-09-11 NOTE — Assessment & Plan Note (Addendum)
Currently asymptomatic and seems to be euvolemic. Continue spironolactone '25mg'$  daily, sacubitril-valsartan 24-'26mg'$  twice daily, and carvedilol 6.'25mg'$  twice daily.

## 2022-09-11 NOTE — Assessment & Plan Note (Signed)
Well-controlled. Continue spironolactone '25mg'$  daily, sacubitril-valsartan 24-'26mg'$  twice daily, and carvedilol 6.'25mg'$  twice daily.

## 2022-09-12 ENCOUNTER — Ambulatory Visit (INDEPENDENT_AMBULATORY_CARE_PROVIDER_SITE_OTHER): Payer: PPO

## 2022-09-12 DIAGNOSIS — I6523 Occlusion and stenosis of bilateral carotid arteries: Secondary | ICD-10-CM | POA: Diagnosis not present

## 2022-09-13 ENCOUNTER — Ambulatory Visit (INDEPENDENT_AMBULATORY_CARE_PROVIDER_SITE_OTHER): Payer: PPO | Admitting: Nurse Practitioner

## 2022-09-13 ENCOUNTER — Encounter (INDEPENDENT_AMBULATORY_CARE_PROVIDER_SITE_OTHER): Payer: PPO

## 2022-09-19 ENCOUNTER — Ambulatory Visit (INDEPENDENT_AMBULATORY_CARE_PROVIDER_SITE_OTHER): Payer: PPO | Admitting: Vascular Surgery

## 2022-09-19 ENCOUNTER — Encounter (INDEPENDENT_AMBULATORY_CARE_PROVIDER_SITE_OTHER): Payer: Self-pay | Admitting: Vascular Surgery

## 2022-09-19 VITALS — BP 95/50 | HR 66 | Ht 60.0 in | Wt 153.0 lb

## 2022-09-19 DIAGNOSIS — I10 Essential (primary) hypertension: Secondary | ICD-10-CM | POA: Diagnosis not present

## 2022-09-19 DIAGNOSIS — I6523 Occlusion and stenosis of bilateral carotid arteries: Secondary | ICD-10-CM | POA: Diagnosis not present

## 2022-09-19 DIAGNOSIS — E782 Mixed hyperlipidemia: Secondary | ICD-10-CM

## 2022-09-19 NOTE — Assessment & Plan Note (Addendum)
Minimal carotid artery stenosis seen on duplex bilaterally. No role for intervention.  Can be checked as needed going forward.

## 2022-09-19 NOTE — Progress Notes (Signed)
MRN : [id number]  Gwendolyn King is a 82 y.o. (March 09, 1941) female who presents with chief complaint of  Chief Complaint  Patient presents with   Follow-up  .  History of Present Illness: Patient returns today in follow up of her carotid duplex. She is doing well without complaints today. No focal neurologic symptoms. Specifically, the patient denies amaurosis fugax, speech or swallowing difficulties, or arm or leg weakness or numbness. Minimal carotid artery stenosis seen on duplex bilaterally.   Current Outpatient Medications  Medication Sig Dispense Refill   acetaminophen (TYLENOL) 500 MG tablet Take 500 mg by mouth at bedtime. Takes 1- 2 tablets at bed time.     aspirin EC 81 MG tablet Take 81 mg by mouth at bedtime.     Biotin 5 MG TABS Take 10 mg by mouth daily.     calcium-vitamin D (OSCAL WITH D) 500-200 MG-UNIT tablet Take 1 tablet by mouth daily.      carvedilol (COREG) 6.25 MG tablet Take 1 tablet (6.25 mg total) by mouth 2 (two) times daily. 180 tablet 0   cholecalciferol (VITAMIN D) 25 MCG (1000 UNIT) tablet Take 1,000 Units by mouth every evening.     clopidogrel (PLAVIX) 75 MG tablet Take 75 mg by mouth daily.     denosumab (PROLIA) 60 MG/ML SOSY injection Inject 60 mg into the skin every 6 (six) months.     Evolocumab (REPATHA) 140 MG/ML SOSY Inject 140 mg into the skin every 14 (fourteen) days. 6 mL 1   Krill Oil 500 MG CAPS Take 500 mg by mouth daily.     levocetirizine (XYZAL) 5 MG tablet Take 5 mg by mouth every evening.   3   LEVOXYL 75 MCG tablet Take 1 tablet by mouth once daily 90 tablet 2   Multiple Vitamin (MULTIVITAMIN WITH MINERALS) TABS tablet Take 1 tablet by mouth daily. Adult 50+     polyethylene glycol powder (GLYCOLAX/MIRALAX) powder Take 17 g by mouth daily. With coffee     sacubitril-valsartan (ENTRESTO) 24-26 MG Take 1 tablet by mouth 2 (two) times daily. 180 tablet 3   spironolactone (ALDACTONE) 25 MG tablet Take 1 tablet (25 mg total) by mouth  daily. 90 tablet 3   vitamin B-12 (CYANOCOBALAMIN) 1000 MCG tablet Take 1,000 mcg by mouth daily.     No current facility-administered medications for this visit.    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   CAD (coronary artery disease)    a. 10/2021 NSTEMI/PCI: LM 31md, LAD 55ost, 99/ (2.5x15 Onyx Frontier DES), D2 90, LCX 80p/m (2.5x30 Onyx Frontier DES), RCA 50ost, 60p, 542mnl iFR throughout). LVEDP 20-2538m.   Cataract    Bil   Chicken pox    Chronic HFrEF (heart failure with reduced ejection fraction) ([facility]  a. 10/2021 Echo: EF 35-40%, mod asymm LVH w/ sev mid-apical ant, apical HK. GrI DD. Nl RV fxn.   Chronic kidney disease    stage 3   Colon polyp    Constipation    Hyperlipidemia    Hypothyroidism    Ischemic cardiomyopathy    a. 10/2021 Echo: EF 35-40%.   [place]I (non-ST elevated myocardial infarction) [place]   Palpitations  over 20 yeras ago   Personal history of radiation therapy    Thyroid disease    Wears hearing aid in both ears     Past Surgical History:  Procedure Laterality Date   ABDOMINAL HYSTERECTOMY  1981   APPENDECTOMY  2012   BREAST BIOPSY Right 05/08/2017   Affirm Bx-DUCTAL CARCINOMA IN SITU (DCIS) WITH ONE FOCUS SUSPICIOUS FOR invasive   BREAST BIOPSY Right 05/17/2017   [place] bx done at DR. Brynetts office, papillary carcinoma   BREAST BIOPSY Right 06/18/2019   Stereo bc calcs COMPATIBLE WITH PRIOR SURGICAL SITE CHANGES   BREAST LUMPECTOMY Right 06/07/2017   DUCTAL CARCINOMA IN SITU (DCIS) with microinvasion at 7:00 5 cmfn   BREAST LUMPECTOMY Right 06/07/2017   PAPILLARY LESION CONSISTENT WITH PAPILLARY CARCINOMA at retroaerolar 7:00 1.5 cmfn   BREAST LUMPECTOMY WITH SENTINEL LYMPH NODE BIOPSY Right 06/07/2017    Wide excision of 2 foci of high-grade DCIS, microinvasion noted only on core biopsy. 2 microscopic foci lateral inferior margins. Patient elected not to proceed to reexcision.   CATARACT EXTRACTION W/PHACO Right 03/27/2022   Procedure: CATARACT EXTRACTION PHACO AND INTRAOCULAR LENS PLACEMENT ([place]) RIGHT VIVITY LENS;  Surgeon: Eulogio Bear, MD;  Location: [facility];  Service: Ophthalmology;  Laterality: Right;  5.49 00:39.1   CATARACT EXTRACTION W/PHACO Left 04/10/2022   Procedure: CATARACT EXTRACTION PHACO AND INTRAOCULAR LENS PLACEMENT ([facility]) LEFT VIVITY LENS 3.92 00:32.4;  Surgeon: Eulogio Bear, MD;  Location: [facility];  Service: Ophthalmology;  Laterality: Left;   COLONOSCOPY     COLONOSCOPY W/ POLYPECTOMY     CORONARY STENT INTERVENTION N/A 10/25/2021   Procedure: CORONARY STENT INTERVENTION;  Surgeon: Nelva Bush, MD;  Location: [facility];  Service: Cardiovascular;  Laterality: N/A;   CYSTOCELE REPAIR N/A 09/11/2019   Procedure: ANTERIOR COLPORRHAPHY;  Surgeon: Gae Dry, MD;  Location: [facility];  Service: Gynecology;  Laterality: N/A;   FINGER FRACTURE SURGERY Right    5th   HAMMER TOE SURGERY  09/20/2017   INTRAVASCULAR PRESSURE WIRE/FFR STUDY N/A 10/25/2021   Procedure: INTRAVASCULAR PRESSURE WIRE/FFR STUDY;  Surgeon: Nelva Bush, MD;  Location: [facility];  Service: Cardiovascular;  Laterality: N/A;   JOINT REPLACEMENT     right knee   LEFT HEART CATH AND CORONARY ANGIOGRAPHY N/A 10/25/2021   Procedure: LEFT HEART CATH AND CORONARY ANGIOGRAPHY;  Surgeon: Nelva Bush, MD;  Location: [facility];  Service: Cardiovascular;  Laterality: N/A;   ROTATOR CUFF REPAIR Right    TONSILLECTOMY AND ADENOIDECTOMY  1963   TOTAL KNEE ARTHROPLASTY Right 10/17/2016   Procedure: TOTAL KNEE ARTHROPLASTY;  Surgeon: Melrose Nakayama, MD;  Location: [facility];  Service: Orthopedics;  Laterality: Right;   VAGINAL DELIVERY     3      Social History   Tobacco Use   Smoking status: Never   Smokeless tobacco: Never  Vaping Use   Vaping Use: Never used  Substance Use Topics   Alcohol use: Yes    Comment: wine occ   Drug use: No      Family History  Problem Relation Age of Onset   Arthritis Mother    Heart disease Mother    Hypertension Mother    Arthritis Father    Heart disease Father    Hypertension Father    Heart attack Father    Stroke Maternal Grandmother    Cancer Maternal Aunt        abdominal?   Hyperlipidemia Maternal  Aunt    Diabetes Paternal Aunt    Glaucoma Brother    Hyperlipidemia Other    Diabetes Other    Breast cancer Neg Hx      Allergies  Allergen Reactions   Singulair [Montelukast] Itching   Lipitor [Atorvastatin] Other (See Comments)    Myalgia, arthralgia   Simvastatin Other (See Comments)    myalgia     REVIEW OF SYSTEMS (Negative unless checked)  Constitutional: '[]'$ Weight loss  '[]'$ Fever  '[]'$ Chills Cardiac: '[]'$ Chest pain   '[]'$ Chest pressure   '[x]'$ Palpitations   '[]'$ Shortness of breath when laying flat   '[]'$ Shortness of breath at rest   '[]'$ Shortness of breath with exertion. Vascular:  '[]'$ Pain in legs with walking   '[]'$ Pain in legs at rest   '[]'$ Pain in legs when laying flat   '[]'$ Claudication   '[]'$ Pain in feet when walking  '[]'$ Pain in feet at rest  '[]'$ Pain in feet when laying flat   '[]'$ History of DVT   '[]'$ Phlebitis   '[]'$ Swelling in legs   '[]'$ Varicose veins   '[]'$ Non-healing ulcers Pulmonary:   '[]'$ Uses home oxygen   '[]'$ Productive cough   '[]'$ Hemoptysis   '[]'$ Wheeze  '[]'$ COPD   '[]'$ Asthma Neurologic:  '[x]'$ Dizziness  '[]'$ Blackouts   '[]'$ Seizures   '[]'$ History of stroke   '[]'$ History of TIA  '[]'$ Aphasia   '[]'$ Temporary blindness   '[]'$ Dysphagia   '[]'$ Weakness or numbness in arms   '[]'$ Weakness or numbness in legs Musculoskeletal:  '[x]'$ Arthritis   '[]'$ Joint swelling   '[x]'$ Joint pain   '[]'$ Low back pain Hematologic:  '[]'$ Easy bruising  '[]'$ Easy bleeding   '[]'$ Hypercoagulable state   '[]'$ Anemic   Gastrointestinal:  '[]'$ Blood in stool    '[]'$ Vomiting blood  '[]'$ Gastroesophageal reflux/heartburn   '[]'$ Abdominal pain Genitourinary:  '[]'$ Chronic kidney disease   '[]'$ Difficult urination  '[]'$ Frequent urination  '[]'$ Burning with urination   '[]'$ Hematuria Skin:  '[]'$ Rashes   '[]'$ Ulcers   '[]'$ Wounds Psychological:  '[]'$ History of anxiety   '[]'$  History of major depression.  Physical Examination  BP (!) 95/50   Pulse 66   Ht 5' (1.524 m)   Wt 153 lb (69.4 kg)   BMI 29.88 kg/m  Gen:  WD/WN, NAD. Appears younger than stated age. Head: [place]/AT, No temporalis wasting. Ear/Nose/Throat: Hearing grossly intact, nares w/o erythema or drainage Eyes: Conjunctiva clear. Sclera non-icteric Neck: Supple.  Trachea midline Pulmonary:  Good air movement, no use of accessory muscles.  Cardiac: RRR, no JVD Vascular:  Vessel Right Left  Radial Palpable Palpable           Musculoskeletal: M/S 5/5 throughout.  No deformity or atrophy. No edema. Neurologic: Sensation grossly intact in extremities.  Symmetrical.  Speech is fluent.  Psychiatric: Judgment intact, Mood & affect appropriate for pt's clinical situation. Dermatologic: No rashes or ulcers noted.  No cellulitis or open wounds.      Labs Recent Results (from the past 2160 hour(s))  Comprehensive metabolic panel     Status: Abnormal   Collection Time: 07/06/22 10:14 AM  Result Value Ref Range   Sodium 139 135 - 145 mmol/L   Potassium 4.0 3.5 - 5.1 mmol/L   Chloride 106 98 - 111 mmol/L   CO2 27 22 - 32 mmol/L   Glucose, Bld 72 70 - 99 mg/dL    Comment: Glucose reference range applies only to samples taken after fasting for at least 8 hours.   BUN 28 (H) 8 - 23 mg/dL   Creatinine, Ser 1.16 (H) 0.44 - 1.00 mg/dL   Calcium 9.9 8.9 - 10.3 mg/dL  Total Protein 6.5 6.5 - 8.1 g/dL   Albumin 4.0 3.5 - 5.0 g/dL   AST 21 15 - 41 U/L   ALT 16 0 - 44 U/L   Alkaline Phosphatase 28 (L) 38 - 126 U/L   Total Bilirubin 0.9 0.3 - 1.2 mg/dL   GFR, Estimated 48 (L) >60 mL/min    Comment: (NOTE) Calculated using  the CKD-EPI Creatinine Equation (2021)    Anion gap 6 5 - 15    Comment: Performed at [facility], [address] [place], [place] [address]  CBC with Differential     Status: Abnormal   Collection Time: 07/06/22 10:14 AM  Result Value Ref Range   WBC 5.2 4.0 - 10.5 K/uL   RBC 4.63 3.87 - 5.11 MIL/uL   Hemoglobin 13.8 12.0 - 15.0 g/dL   HCT 40.9 36.0 - 46.0 %   MCV 88.3 80.0 - 100.0 fL   MCH 29.8 26.0 - 34.0 pg   MCHC 33.7 30.0 - 36.0 g/dL   RDW 13.2 11.5 - 15.5 %   Platelets 98 (L) 150 - 400 K/uL   nRBC 0.0 0.0 - 0.2 %   Neutrophils Relative % 50 %   Neutro Abs 2.7 1.7 - 7.7 K/uL   Lymphocytes Relative 36 %   Lymphs Abs 1.9 0.7 - 4.0 K/uL   Monocytes Relative 10 %   Monocytes Absolute 0.5 0.1 - 1.0 K/uL   Eosinophils Relative 3 %   Eosinophils Absolute 0.1 0.0 - 0.5 K/uL   Basophils Relative 1 %   Basophils Absolute 0.0 0.0 - 0.1 K/uL   Immature Granulocytes 0 %   Abs Immature Granulocytes 0.02 0.00 - 0.07 K/uL    Comment: Performed at [facility], [address], [place], [place] [address]    Radiology VAS US CAROTID  Result Date: 09/18/2022 Carotid Arterial Duplex Study Patient Name:  GYSELLE MATTHEW Sabella  Date of Exam:   09/12/2022 Medical Rec #: [id number]      Accession #:    [id number] Date of Birth: May 16, 1941      Patient Gender: F Patient Age:   48 years Exam Location:  [facility] Procedure:      VAS US CAROTID Referring Phys: Leotis Pain --------------------------------------------------------------------------------  Indications: TIA. Performing Technologist: Concha Norway RVT  Examination Guidelines: A complete evaluation includes B-mode imaging, spectral Doppler, color Doppler, and power Doppler as needed of all accessible portions of each vessel. Bilateral testing is considered an integral part of a complete examination. Limited examinations for reoccurring indications may be performed as noted.  Right Carotid Findings:  +----------+--------+--------+--------+------------------+--------+           PSV cm/sEDV cm/sStenosisPlaque DescriptionComments +----------+--------+--------+--------+------------------+--------+ CCA Prox  86      11                                         +----------+--------+--------+--------+------------------+--------+ CCA Mid   93      12                                         +----------+--------+--------+--------+------------------+--------+ CCA Distal68      11                                         +----------+--------+--------+--------+------------------+--------+  ICA Prox  43      11                                         +----------+--------+--------+--------+------------------+--------+ ICA Mid   57      12                                         +----------+--------+--------+--------+------------------+--------+ ICA Distal77      15                                         +----------+--------+--------+--------+------------------+--------+ ECA       94      0                                          +----------+--------+--------+--------+------------------+--------+ +----------+--------+-------+----------------+-------------------+           PSV cm/sEDV cmsDescribe        Arm Pressure (mmHG) +----------+--------+-------+----------------+-------------------+ [id number]             Multiphasic, WNL                    +----------+--------+-------+----------------+-------------------+ +---------+--------+--+--------+-+---------+ VertebralPSV cm/s44EDV cm/s5Antegrade +---------+--------+--+--------+-+---------+  Left Carotid Findings: +----------+--------+--------+--------+------------------+--------+           PSV cm/sEDV cm/sStenosisPlaque DescriptionComments +----------+--------+--------+--------+------------------+--------+ CCA Prox  54      5                                           +----------+--------+--------+--------+------------------+--------+ CCA Mid   60      9                                          +----------+--------+--------+--------+------------------+--------+ CCA Distal76      13                                         +----------+--------+--------+--------+------------------+--------+ ICA Prox  85      18                                         +----------+--------+--------+--------+------------------+--------+ ICA Mid   82      20                                         +----------+--------+--------+--------+------------------+--------+ ICA Distal76      20                                         +----------+--------+--------+--------+------------------+--------+  ECA       96      2                                          +----------+--------+--------+--------+------------------+--------+ +----------+--------+--------+----------------+-------------------+           PSV cm/sEDV cm/sDescribe        Arm Pressure (mmHG) +----------+--------+--------+----------------+-------------------+ [id number]             Multiphasic, WNL                    +----------+--------+--------+----------------+-------------------+ +---------+--------+--+--------+-+---------+ VertebralPSV cm/s64EDV cm/s9Antegrade +---------+--------+--+--------+-+---------+   Summary: Right Carotid: The extracranial vessels were near-normal with only minimal wall                thickening or plaque. Left Carotid: The extracranial vessels were near-normal with only minimal wall               thickening or plaque. Vertebrals:  Bilateral vertebral arteries demonstrate antegrade flow. Subclavians: Normal flow hemodynamics were seen in bilateral subclavian              arteries. *See table(s) above for measurements and observations.  Electronically signed by Leotis Pain MD on 09/18/2022 at 8:21:01 AM.    Final     Assessment/Plan Hyperlipidemia lipid  control important in reducing the progression of atherosclerotic disease. Continue Repatha. Intolerant to statins.      Hypertension Previous renal artery duplex did not show any significant stenosis.   Carotid stenosis Minimal carotid artery stenosis seen on duplex bilaterally. No role for intervention.  Can be checked as needed going forward.    Leotis Pain, MD  09/19/2022 2:20 PM    This note was created with Dragon medical transcription system.  Any errors from dictation are purely unintentional

## 2022-09-26 DIAGNOSIS — H903 Sensorineural hearing loss, bilateral: Secondary | ICD-10-CM | POA: Diagnosis not present

## 2022-09-26 DIAGNOSIS — H6123 Impacted cerumen, bilateral: Secondary | ICD-10-CM | POA: Diagnosis not present

## 2022-09-28 ENCOUNTER — Other Ambulatory Visit: Payer: Self-pay

## 2022-10-02 MED ORDER — CLOPIDOGREL BISULFATE 75 MG PO TABS: 75.0000 mg | ORAL_TABLET | Freq: Every day | ORAL | 0 refills | Status: DC

## 2022-10-06 ENCOUNTER — Telehealth: Payer: Self-pay | Admitting: Cardiovascular Disease

## 2022-10-06 MED ORDER — CLOPIDOGREL BISULFATE 75 MG PO TABS: 75.0000 mg | ORAL_TABLET | Freq: Every day | ORAL | 0 refills | Status: DC

## 2022-10-06 NOTE — Telephone Encounter (Signed)
Pharmacy is calling to get increase in quantity. They would like for a 90 day be sent in for this prescription. 90 day Rx sent to the pharmacy of choice

## 2022-10-06 NOTE — Telephone Encounter (Signed)
Pt c/o medication issue:  1. Name of Medication: clopidogrel (PLAVIX) 75 MG tablet   2. How are you currently taking this medication (dosage and times per day)?   3. Are you having a reaction (difficulty breathing--STAT)?   4. What is your medication issue? Pharmacy is calling to get increase in quantity. They would like for a 90 day be sent in for this prescription.

## 2022-10-21 NOTE — Progress Notes (Unsigned)
Cardiology Office Note  Date:  10/23/2022   ID:  Gwendolyn King, DOB 07/03/1941, MRN [id number]  PCP:  Leone Haven, MD   Chief Complaint  Patient presents with   6 month follow up     "Doing well." Medications reviewed by the patient verbally.     HPI:  [name] is a 82 year-old woman with long history of palpitations,  hyperlipidemia,  Aortic atherosclerosis on CT scan 2012 leg edema treated with Lasix as needed,  CAD, Stent placed to left circumflex and LAD Ejection fraction 35 to 40% October 24, 2021 who presents for follow-up of her palpitations, non-STEMI/CAD, PCI, cardiomyopathy  Seen by one of our providers August 2023 Amlodipine held, carvedilol started, cardiac rehab ordered following non-STEMI Last seen by myself in clinic 5/23  Husband with dementia, deconditioned  Reports that she feels well, denies significant chest pain concerning for angina No shortness of breath, no significant leg swelling Blood pressure low but denies significant orthostasis symptoms On Coreg, Entresto, spironolactone, not requiring Lasix  Recent imaging reviewed Carotid u/s no significant diseaser/minimal dz  Lab work reviewed A1C 5.1 Cholesterol at goal   EKG personally reviewed by myself on todays visit Normal sinus rhythm rate 60 bpm no significant ST-T wave changes  Other past medical history reviewed Non-STEMI October 24, 2021 Echo showed an EF of 35 to 40% with grade 1 diastolic dysfunction, and severe hypokinesis of the left ventricular, mid-apical anterior, and apical walls, concerning for LAD infarct.   Stent placed to left circumflex and LAD  Diagnostic catheterization was carried out and showed 99% stenosis in the mid LAD with a 90% stenosis in the second diagonal.  She also had an 80% stenosis in the proximal to mid left circumflex with abnormal fractional flow reserve.  She had nonobstructive disease throughout the right coronary.  She underwent drug-eluting stent  placement to the mid LAD and proximal/mid left circumflex.    Post cath, creatinine rose to 1.22 and ACEi/ARB/arni/mra were avoided.  History of statin and Zetia intolerance On Repatha Left thalamic stroke history, on aspirin Plavix Repatha  Lab work reviewed A1c 5.1, total cholesterol 114 LDL 37  Simvastatin,  had myalgias    PMH:   has a past medical history of Allergy, Arthritis, Asthma, Breast cancer ([place]) (05/08/2017), CAD (coronary artery disease), Cataract, Chicken pox, Chronic HFrEF (heart failure with reduced ejection fraction) ([place]), Chronic kidney disease, Colon polyp, Constipation, Hyperlipidemia, Hypothyroidism, Ischemic cardiomyopathy, NSTEMI (non-ST elevated myocardial infarction) ([name]) (10/24/2021), Palpitations, Personal history of radiation therapy, Thyroid disease, and Wears hearing aid in both ears.  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 2 microscopic foci lateral inferior margins. Patient elected not to proceed to reexcision.  CATARACT EXTRACTION W/PHACO Right 03/27/2022   Procedure: CATARACT EXTRACTION PHACO AND INTRAOCULAR LENS PLACEMENT ([facility]) RIGHT VIVITY LENS;  Surgeon: Eulogio Bear, MD;  Location: [place];  Service: Ophthalmology;  Laterality: Right;  5.49 00:39.1   CATARACT EXTRACTION W/PHACO Left 04/10/2022   Procedure: CATARACT EXTRACTION PHACO AND INTRAOCULAR LENS PLACEMENT ([facility]) LEFT VIVITY LENS 3.92 00:32.4;  Surgeon: Eulogio Bear, MD;  Location: [place];  Service: Ophthalmology;  Laterality: Left;   COLONOSCOPY     COLONOSCOPY W/ POLYPECTOMY     CORONARY STENT INTERVENTION N/A 10/25/2021   Procedure: CORONARY STENT INTERVENTION;  Surgeon: Nelva Bush, MD;  Location: [place] [facility];  Service: Cardiovascular;  Laterality: N/A;   CYSTOCELE REPAIR N/A 09/11/2019   Procedure: ANTERIOR COLPORRHAPHY;  Surgeon: Gae Dry, MD;  Location: [facility];  Service: Gynecology;  Laterality: N/A;   FINGER FRACTURE SURGERY Right    5th   HAMMER TOE SURGERY  09/20/2017   INTRAVASCULAR PRESSURE WIRE/FFR STUDY N/A 10/25/2021   Procedure: INTRAVASCULAR PRESSURE WIRE/FFR STUDY;  Surgeon: Nelva Bush, MD;  Location: [place] [facility];  Service: Cardiovascular;  Laterality: N/A;   JOINT REPLACEMENT     right knee   LEFT HEART CATH AND CORONARY ANGIOGRAPHY N/A 10/25/2021   Procedure: LEFT HEART CATH AND CORONARY ANGIOGRAPHY;  Surgeon: Nelva Bush, MD;  Location: [place] [facility];  Service: Cardiovascular;  Laterality: N/A;   ROTATOR CUFF REPAIR Right    TONSILLECTOMY AND ADENOIDECTOMY  1963   TOTAL KNEE ARTHROPLASTY Right 10/17/2016   Procedure: TOTAL KNEE ARTHROPLASTY;  Surgeon: Melrose Nakayama, MD;  Location: [place];  Service: Orthopedics;  Laterality: Right;   VAGINAL DELIVERY     3   Current Outpatient Medications on File Prior to Visit  Medication Sig Dispense Refill   acetaminophen (TYLENOL) 500 MG tablet Take 500 mg by mouth at bedtime. Takes 1- 2 tablets at bed time.     aspirin EC 81 MG tablet Take 81 mg by mouth at bedtime.     Biotin 5 MG TABS Take 10 mg by mouth daily.     calcium-vitamin D (OSCAL WITH D) 500-200 MG-UNIT tablet Take 1 tablet  by mouth daily.      carvedilol (COREG) 6.25 MG tablet Take 1 tablet (6.25 mg total) by mouth 2 (two) times daily. 180 tablet 0   cholecalciferol (VITAMIN D) 25 MCG (1000 UNIT) tablet Take 1,000 Units by mouth every evening.     clopidogrel (PLAVIX) 75 MG tablet Take 1 tablet (75 mg total) by mouth daily. 90 tablet 0   denosumab (PROLIA) 60 MG/ML SOSY injection Inject 60 mg into the skin every 6 (six) months.     Evolocumab (REPATHA) 140 MG/ML SOSY Inject 140 mg into the skin every 14 (fourteen) days. 6 mL 1   Krill Oil 500 MG CAPS Take 500 mg by mouth daily.     levocetirizine (XYZAL) 5 MG tablet Take 5 mg by mouth every evening.   3   LEVOXYL 75 MCG tablet Take 1 tablet by mouth once daily 90 tablet 2   Multiple Vitamin (MULTIVITAMIN WITH MINERALS) TABS tablet Take 1 tablet by mouth daily. Adult 50+     polyethylene glycol powder (GLYCOLAX/MIRALAX) powder Take 17 g by mouth daily. With coffee     sacubitril-valsartan (ENTRESTO) 24-26 MG Take 1 tablet by mouth 2 (two) times daily. 180 tablet 3   spironolactone (ALDACTONE) 25 MG tablet Take 1 tablet (25 mg total) by mouth  daily. 90 tablet 3   vitamin B-12 (CYANOCOBALAMIN) 1000 MCG tablet Take 1,000 mcg by mouth daily.     No current facility-administered medications on file prior to visit.    Allergies:   Lipitor [atorvastatin] (palpitations) and Simvastatin   Social History:  The patient  reports that she has never smoked. She has never used smokeless tobacco. She reports current alcohol use. She reports that she does not use drugs.   Family History:   family history includes Arthritis in her father and mother; Cancer in her maternal aunt; Diabetes in her paternal aunt and another family member; Glaucoma in her brother; Heart attack in her father; Heart disease in her father and mother; Hyperlipidemia in her maternal aunt and another family member; Hypertension in her father and mother; Stroke in her maternal grandmother.    Review of  Systems: Review of Systems  Constitutional: Negative.   HENT: Negative.    Respiratory: Negative.    Cardiovascular: Negative.   Gastrointestinal: Negative.   Musculoskeletal: Negative.   Neurological: Negative.   Psychiatric/Behavioral: Negative.    All other systems reviewed and are negative.   PHYSICAL EXAM: VS:  BP (!) 100/48 (BP Location: Left Arm, Patient Position: Sitting, Cuff Size: Normal)   Pulse 60   Ht '5\' 1"'$  (1.549 m)   Wt 155 lb (70.3 kg)   SpO2 96%   BMI 29.29 kg/m  , BMI Body mass index is 29.29 kg/m. Constitutional:  oriented to person, place, and time. No distress.  HENT:  Head: Grossly normal Eyes:  no discharge. No scleral icterus.  Neck: No JVD, no carotid bruits  Cardiovascular: Regular rate and rhythm, no murmurs appreciated Pulmonary/Chest: Clear to auscultation bilaterally, no wheezes or rails Abdominal: Soft.  no distension.  no tenderness.  Musculoskeletal: Normal range of motion Neurological:  normal muscle tone. Coordination normal. No atrophy Skin: Skin warm and dry Psychiatric: normal affect, pleasant  Recent Labs: 10/24/2021: Magnesium 2.0 06/09/2022: Brain Natriuretic Peptide 64; TSH 1.22 07/06/2022: ALT 16; BUN 28; Creatinine, Ser 1.16; Hemoglobin 13.8; Platelets 98; Potassium 4.0; Sodium 139    Lipid Panel Lab Results  Component Value Date   CHOL 114 12/09/2021   HDL 47.20 12/09/2021   LDLCALC 37 12/09/2021   TRIG 148.0 12/09/2021    Wt Readings from Last 3 Encounters:  10/23/22 155 lb (70.3 kg)  09/19/22 153 lb (69.4 kg)  09/11/22 153 lb 8 oz (69.6 kg)     ASSESSMENT AND PLAN:  Coronary artery disease with stable angina Prior stenting March 2023 with associated cardiomyopathy Denies anginal symptoms, no further testing at this time Non-smoker, no significant diabetes, cholesterol at goal  Ischemic cardiomyopathy Prior ejection fraction 35% in March 2023 On carvedilol 6.25 twice daily, Entresto 24/26 mg twice a  day.spironolactone 25 mg daily,  Repeat echocardiogram ordered  Essential hypertension - Plan: EKG 12-Lead Add spironolactone as above, continue Coreg and Entresto  Palpitations - Plan: EKG 12-Lead on low-dose beta-blocker, Coreg Denies significant symptoms  Hyperlipidemia Statin intolerance Doing well on Repatha, cholesterol at goal  Obesity She has started exercise program  Aortic atherosclerosis Seen on CT scan, images  Risk factors at goal   Total encounter time more than 30 minutes  Greater than 50% was spent in counseling and coordination of care with the patient    Orders Placed This Encounter  Procedures   EKG 12-Lead     Signed, Esmond Plants, M.D., Ph.D. 10/23/2022  [place], [place]

## 2022-10-23 ENCOUNTER — Encounter: Payer: Self-pay | Admitting: Cardiovascular Disease

## 2022-10-23 ENCOUNTER — Ambulatory Visit: Payer: PPO | Attending: Cardiovascular Disease | Admitting: Cardiovascular Disease

## 2022-10-23 VITALS — BP 100/48 | HR 60 | Ht 61.0 in | Wt 155.0 lb

## 2022-10-23 DIAGNOSIS — E785 Hyperlipidemia, unspecified: Secondary | ICD-10-CM | POA: Diagnosis not present

## 2022-10-23 DIAGNOSIS — I639 Cerebral infarction, unspecified: Secondary | ICD-10-CM

## 2022-10-23 DIAGNOSIS — I872 Venous insufficiency (chronic) (peripheral): Secondary | ICD-10-CM | POA: Diagnosis not present

## 2022-10-23 DIAGNOSIS — I10 Essential (primary) hypertension: Secondary | ICD-10-CM | POA: Diagnosis not present

## 2022-10-23 DIAGNOSIS — I2510 Atherosclerotic heart disease of native coronary artery without angina pectoris: Secondary | ICD-10-CM

## 2022-10-23 DIAGNOSIS — T466X5A Adverse effect of antihyperlipidemic and antiarteriosclerotic drugs, initial encounter: Secondary | ICD-10-CM

## 2022-10-23 DIAGNOSIS — M791 Myalgia, unspecified site: Secondary | ICD-10-CM | POA: Diagnosis not present

## 2022-10-23 DIAGNOSIS — I255 Ischemic cardiomyopathy: Secondary | ICD-10-CM

## 2022-10-23 DIAGNOSIS — I6523 Occlusion and stenosis of bilateral carotid arteries: Secondary | ICD-10-CM

## 2022-10-23 DIAGNOSIS — T466X5D Adverse effect of antihyperlipidemic and antiarteriosclerotic drugs, subsequent encounter: Secondary | ICD-10-CM | POA: Diagnosis not present

## 2022-10-23 DIAGNOSIS — N183 Chronic kidney disease, stage 3 unspecified: Secondary | ICD-10-CM

## 2022-10-23 DIAGNOSIS — I700 Atherosclerosis of aorta: Secondary | ICD-10-CM

## 2022-10-23 NOTE — Patient Instructions (Addendum)
Medication Instructions:  No changes  If you need a refill on your cardiac medications before your next appointment, please call your pharmacy.   Lab work: No new labs needed  Testing/Procedures: Your physician has requested that you have an echocardiogram. Echocardiography is a painless test that uses sound waves to create images of your heart. It provides your doctor with information about the size and shape of your heart and how well your heart's chambers and valves are working.   You may receive an ultrasound enhancing agent through an IV if needed to better visualize your heart during the echo. This procedure takes approximately one hour.  There are no restrictions for this procedure.  This will take place at [facility] [id number], [place]   Follow-Up: At [facility], you and your health needs are our priority.  As part of our continuing mission to provide you with exceptional heart care, we have created designated Provider Care Teams.  These Care Teams include your primary Cardiologist (physician) and Advanced Practice Providers (APPs -  Physician Assistants and Nurse Practitioners) who all work together to provide you with the care you need, when you need it.  You will need a follow up appointment in 6 months  Providers on your designated Care Team:   Murray Hodgkins, NP Christell Faith, PA-C Cadence Kathlen Mody, [place]  COVID-19 Vaccine Information can be found at: ShippingScam.co.uk For questions related to vaccine distribution or appointments, please email [email or web address] or call [phone].

## 2022-11-21 ENCOUNTER — Encounter: Payer: Self-pay | Admitting: Cardiovascular Disease

## 2022-11-21 ENCOUNTER — Encounter: Payer: Self-pay | Admitting: Family Medicine

## 2022-11-22 ENCOUNTER — Other Ambulatory Visit: Payer: Self-pay

## 2022-11-22 MED ORDER — CARVEDILOL 6.25 MG PO TABS: 6.2500 mg | ORAL_TABLET | Freq: Two times a day (BID) | ORAL | 0 refills | Status: DC

## 2022-11-22 MED ORDER — VITAMIN D3 25 MCG (1000 UNIT) PO TABS: 1000.0000 [IU] | ORAL_TABLET | Freq: Every evening | ORAL | 3 refills | Status: DC

## 2022-11-22 NOTE — Progress Notes (Signed)
New Rx sent to pharmacy of choice  did you send in a new prescription for these 2? If not, please do because they do not have one. Thanks, Anselm Lis,   Good morning. Both pharmacies are listed however I made [facility] mail order the preferred pharmacy.   Very respectfully, Floreen Comber, RN

## 2022-11-27 ENCOUNTER — Other Ambulatory Visit: Payer: Self-pay

## 2022-11-27 DIAGNOSIS — E782 Mixed hyperlipidemia: Secondary | ICD-10-CM

## 2022-11-27 DIAGNOSIS — I639 Cerebral infarction, unspecified: Secondary | ICD-10-CM

## 2022-11-27 MED ORDER — REPATHA 140 MG/ML ~~LOC~~ SOSY: 140.0000 mg | PREFILLED_SYRINGE | SUBCUTANEOUS | 1 refills | Status: DC

## 2022-11-27 NOTE — Telephone Encounter (Signed)
Patient called and said that [facility]) [place], [place] - [address] does not have the prescription for Repatha, please resend.

## 2022-11-30 ENCOUNTER — Other Ambulatory Visit: Payer: Self-pay

## 2022-11-30 DIAGNOSIS — I639 Cerebral infarction, unspecified: Secondary | ICD-10-CM

## 2022-11-30 DIAGNOSIS — E782 Mixed hyperlipidemia: Secondary | ICD-10-CM

## 2022-11-30 MED ORDER — REPATHA 140 MG/ML ~~LOC~~ SOSY: 140.0000 mg | PREFILLED_SYRINGE | SUBCUTANEOUS | 1 refills | Status: DC

## 2022-11-30 NOTE — Telephone Encounter (Signed)
Pt called stating she gave the wrong information for [facility] to send medication to. Pt still want the medication sent to them Phone-[phone] Fax-[phone] Electronic# [phone]

## 2022-12-04 ENCOUNTER — Ambulatory Visit: Payer: PPO | Attending: Cardiovascular Disease

## 2022-12-04 DIAGNOSIS — I255 Ischemic cardiomyopathy: Secondary | ICD-10-CM

## 2022-12-04 LAB — ECHOCARDIOGRAM COMPLETE
AR max vel: 3.17 cm2
AV Area VTI: 3.38 cm2
AV Area mean vel: 3.19 cm2
AV Mean grad: 3 mmHg
AV Peak grad: 4.6 mmHg
Ao pk vel: 1.07 m/s
Area-P 1/2: 2.85 cm2
S' Lateral: 2.6 cm
Single Plane A4C EF: 56.4 %

## 2022-12-08 ENCOUNTER — Other Ambulatory Visit: Payer: Self-pay

## 2022-12-08 ENCOUNTER — Telehealth: Payer: Self-pay | Admitting: Family Medicine

## 2022-12-08 DIAGNOSIS — I639 Cerebral infarction, unspecified: Secondary | ICD-10-CM

## 2022-12-08 DIAGNOSIS — E782 Mixed hyperlipidemia: Secondary | ICD-10-CM

## 2022-12-08 MED ORDER — REPATHA 140 MG/ML ~~LOC~~ SOSY: 140.0000 mg | PREFILLED_SYRINGE | SUBCUTANEOUS | 1 refills | Status: DC

## 2022-12-08 NOTE — Telephone Encounter (Signed)
Prescription Request  12/08/2022  LOV: 09/11/2022  What is the name of the medication or equipment? Evolocumab (REPATHA) 140 MG/ML SOSY  Have you contacted your pharmacy to request a refill? Yes   Which pharmacy would you like this sent to?   [facility]) [place], [place] - [address] [place] [address] Phone: [phone] Fax: [phone]      Patient notified that their request is being sent to the clinical staff for review and that they should receive a response within 2 business days.   Please advise at [facility] [phone]   As per pt, she still waitin on her refill. Its been 3 weeks, and still nothing. Pt mentioned to send it in electronically to the pharmacy.

## 2022-12-08 NOTE — Telephone Encounter (Signed)
Prescription sent

## 2022-12-12 ENCOUNTER — Other Ambulatory Visit: Payer: Self-pay | Admitting: Family Medicine

## 2022-12-12 NOTE — Telephone Encounter (Signed)
Pt called in staying that rx was sent to the wrong [facility]. As per pt, the right [facility] pharmacy info its with phone # [phone] Electronic [facility] [phone] N[facility] [phone] Fax [phone]

## 2022-12-13 ENCOUNTER — Other Ambulatory Visit: Payer: Self-pay

## 2022-12-13 MED ORDER — ENTRESTO 24-26 MG PO TABS: 1.0000 | ORAL_TABLET | Freq: Two times a day (BID) | ORAL | 1 refills | Status: DC

## 2022-12-13 MED ORDER — CLOPIDOGREL BISULFATE 75 MG PO TABS: 75.0000 mg | ORAL_TABLET | Freq: Every day | ORAL | 1 refills | Status: DC

## 2022-12-13 MED ORDER — SPIRONOLACTONE 25 MG PO TABS: 25.0000 mg | ORAL_TABLET | Freq: Every day | ORAL | 1 refills | Status: DC

## 2022-12-13 MED ORDER — CARVEDILOL 6.25 MG PO TABS: 6.2500 mg | ORAL_TABLET | Freq: Two times a day (BID) | ORAL | 1 refills | Status: DC

## 2022-12-13 NOTE — Telephone Encounter (Signed)
Requested Prescriptions   Signed Prescriptions Disp Refills   sacubitril-valsartan (ENTRESTO) 24-26 MG 180 tablet 1    Sig: Take 1 tablet by mouth 2 (two) times daily.    Authorizing Provider: Antonieta Iba    Ordering User: Thayer Headings, Jonathon Castelo L   spironolactone (ALDACTONE) 25 MG tablet 90 tablet 1    Sig: Take 1 tablet (25 mg total) by mouth daily.    Authorizing Provider: Antonieta Iba    Ordering User: Thayer Headings, Kalima Saylor L   clopidogrel (PLAVIX) 75 MG tablet 90 tablet 1    Sig: Take 1 tablet (75 mg total) by mouth daily.    Authorizing Provider: Antonieta Iba    Ordering User: Thayer Headings, Laruth Hanger L   Refills resent to correct pharmacy.

## 2022-12-13 NOTE — Addendum Note (Signed)
Addended by: Thayer Headings, Joylene Wescott L on: 12/13/2022 09:06 AM   Modules accepted: Orders

## 2022-12-13 NOTE — Telephone Encounter (Signed)
Requested Prescriptions   Signed Prescriptions Disp Refills   clopidogrel (PLAVIX) 75 MG tablet 90 tablet 1    Sig: Take 1 tablet (75 mg total) by mouth daily.    Authorizing Provider: Antoni[facility] Iba    Ordering User: Thayer Headings, Jarman Litton L   sacubitril-valsartan (ENTRESTO) 24-26 MG 180 tablet 1    Sig: Take 1 tablet by mouth 2 (two) times daily.    Authorizing Provider: [facility]ba    Ordering User: Thayer Headings, Sireen Halk L

## 2022-12-15 ENCOUNTER — Other Ambulatory Visit: Payer: Self-pay | Admitting: *Deleted

## 2022-12-15 DIAGNOSIS — E782 Mixed hyperlipidemia: Secondary | ICD-10-CM

## 2022-12-15 DIAGNOSIS — I639 Cerebral infarction, unspecified: Secondary | ICD-10-CM

## 2022-12-15 MED ORDER — LEVOCETIRIZINE DIHYDROCHLORIDE 5 MG PO TABS: 5.0000 mg | ORAL_TABLET | Freq: Every evening | ORAL | 3 refills | Status: DC

## 2022-12-15 MED ORDER — REPATHA 140 MG/ML ~~LOC~~ SOSY: 140.0000 mg | PREFILLED_SYRINGE | SUBCUTANEOUS | 1 refills | Status: DC

## 2022-12-15 NOTE — Telephone Encounter (Signed)
Resent to correct pharmacy.

## 2022-12-15 NOTE — Telephone Encounter (Signed)
Resent to correct pharmacy & removed the incorrect one.

## 2022-12-18 ENCOUNTER — Other Ambulatory Visit: Payer: Self-pay

## 2022-12-18 DIAGNOSIS — I639 Cerebral infarction, unspecified: Secondary | ICD-10-CM

## 2022-12-18 DIAGNOSIS — E782 Mixed hyperlipidemia: Secondary | ICD-10-CM

## 2022-12-18 MED ORDER — REPATHA 140 MG/ML ~~LOC~~ SOSY: 140.0000 mg | PREFILLED_SYRINGE | SUBCUTANEOUS | 1 refills | Status: DC

## 2022-12-18 MED ORDER — LEVOCETIRIZINE DIHYDROCHLORIDE 5 MG PO TABS: 5.0000 mg | ORAL_TABLET | Freq: Every evening | ORAL | 3 refills | Status: DC

## 2022-12-18 NOTE — Telephone Encounter (Signed)
Medications was just re sent to [phone]

## 2022-12-18 NOTE — Telephone Encounter (Signed)
Pt called stating the pharmacy has not received the medication

## 2022-12-19 ENCOUNTER — Telehealth: Payer: Self-pay | Admitting: Family Medicine

## 2022-12-19 DIAGNOSIS — E039 Hypothyroidism, unspecified: Secondary | ICD-10-CM

## 2022-12-19 NOTE — Telephone Encounter (Signed)
Prescription Request  12/19/2022  LOV: 09/11/2022  What is the name of the medication or equipment? LEVOXYL 75 MCG tablet.   Have you contacted your pharmacy to request a refill? Yes   Which pharmacy would you like this sent to?    [facility]) [place], [place] - [address]  Phone: [phone] Fax: [phone]  Please call, [facility] [id number], [facility] [phone]  Patient notified that their request is being sent to the clinical staff for review and that they should receive a response within 2 business days.   Please advise at [facility] [phone]

## 2022-12-20 ENCOUNTER — Other Ambulatory Visit: Payer: Self-pay

## 2022-12-20 DIAGNOSIS — E039 Hypothyroidism, unspecified: Secondary | ICD-10-CM

## 2022-12-20 MED ORDER — LEVOXYL 75 MCG PO TABS: ORAL_TABLET | ORAL | 2 refills | Status: DC

## 2022-12-20 NOTE — Telephone Encounter (Signed)
Sent to pharmacy 

## 2022-12-20 NOTE — Addendum Note (Signed)
Addended by: Glori Luis on: 12/20/2022 09:32 AM   Modules accepted: Orders

## 2023-01-02 ENCOUNTER — Encounter: Payer: Self-pay | Admitting: Family Medicine

## 2023-01-02 DIAGNOSIS — E039 Hypothyroidism, unspecified: Secondary | ICD-10-CM

## 2023-01-04 ENCOUNTER — Inpatient Hospital Stay: Payer: PPO | Admitting: Oncology

## 2023-01-04 ENCOUNTER — Inpatient Hospital Stay: Payer: PPO

## 2023-01-04 ENCOUNTER — Inpatient Hospital Stay: Payer: PPO | Attending: Oncology

## 2023-01-04 ENCOUNTER — Encounter: Payer: Self-pay | Admitting: Oncology

## 2023-01-04 VITALS — BP 103/53 | HR 61 | Temp 97.9°F | Resp 16 | Ht 61.0 in | Wt 158.2 lb

## 2023-01-04 DIAGNOSIS — D0511 Intraductal carcinoma in situ of right breast: Secondary | ICD-10-CM | POA: Diagnosis not present

## 2023-01-04 DIAGNOSIS — M810 Age-related osteoporosis without current pathological fracture: Secondary | ICD-10-CM | POA: Diagnosis present

## 2023-01-04 DIAGNOSIS — D696 Thrombocytopenia, unspecified: Secondary | ICD-10-CM | POA: Diagnosis not present

## 2023-01-04 DIAGNOSIS — Z86000 Personal history of in-situ neoplasm of breast: Secondary | ICD-10-CM | POA: Insufficient documentation

## 2023-01-04 DIAGNOSIS — Z809 Family history of malignant neoplasm, unspecified: Secondary | ICD-10-CM | POA: Insufficient documentation

## 2023-01-04 LAB — COMPREHENSIVE METABOLIC PANEL WITH GFR
ALT: 16 U/L (ref 0–44)
AST: 22 U/L (ref 15–41)
Albumin: 4.1 g/dL (ref 3.5–5.0)
Alkaline Phosphatase: 33 U/L — ABNORMAL LOW (ref 38–126)
Anion gap: 9 (ref 5–15)
BUN: 23 mg/dL (ref 8–23)
CO2: 26 mmol/L (ref 22–32)
Calcium: 10 mg/dL (ref 8.9–10.3)
Chloride: 105 mmol/L (ref 98–111)
Creatinine, Ser: 1.16 mg/dL — ABNORMAL HIGH (ref 0.44–1.00)
GFR, Estimated: 48 mL/min — ABNORMAL LOW
Glucose, Bld: 90 mg/dL (ref 70–99)
Potassium: 4.3 mmol/L (ref 3.5–5.1)
Sodium: 140 mmol/L (ref 135–145)
Total Bilirubin: 0.7 mg/dL (ref 0.3–1.2)
Total Protein: 6.6 g/dL (ref 6.5–8.1)

## 2023-01-04 LAB — CBC WITH DIFFERENTIAL/PLATELET
Abs Immature Granulocytes: 0.03 10*3/uL (ref 0.00–0.07)
Basophils Absolute: 0 10*3/uL (ref 0.0–0.1)
Basophils Relative: 1 %
Eosinophils Absolute: 0.4 10*3/uL (ref 0.0–0.5)
Eosinophils Relative: 6 %
HCT: 40.7 % (ref 36.0–46.0)
Hemoglobin: 13.4 g/dL (ref 12.0–15.0)
Immature Granulocytes: 1 %
Lymphocytes Relative: 29 %
Lymphs Abs: 1.9 10*3/uL (ref 0.7–4.0)
MCH: 29.3 pg (ref 26.0–34.0)
MCHC: 32.9 g/dL (ref 30.0–36.0)
MCV: 89.1 fL (ref 80.0–100.0)
Monocytes Absolute: 0.6 10*3/uL (ref 0.1–1.0)
Monocytes Relative: 10 %
Neutro Abs: 3.5 10*3/uL (ref 1.7–7.7)
Neutrophils Relative %: 53 %
Platelets: 117 10*3/uL — ABNORMAL LOW (ref 150–400)
RBC: 4.57 MIL/uL (ref 3.87–5.11)
RDW: 13 % (ref 11.5–15.5)
WBC: 6.4 10*3/uL (ref 4.0–10.5)
nRBC: 0 % (ref 0.0–0.2)

## 2023-01-04 MED ORDER — DENOSUMAB 60 MG/ML ~~LOC~~ SOSY
60.0000 mg | PREFILLED_SYRINGE | Freq: Once | SUBCUTANEOUS | Status: AC
  Administered 2023-01-04: 60 mg via SUBCUTANEOUS
  Filled 2023-01-04: qty 1

## 2023-01-04 MED ORDER — LEVOXYL 75 MCG PO TABS: ORAL_TABLET | ORAL | 2 refills | Status: DC

## 2023-01-04 NOTE — Progress Notes (Signed)
[facility]  [phone] [phone]  ID: Gwendolyn King OB: [birth year]  MR#: [id number]  AOZ#:[id number]  Patient Care Team: Glori Luis, MD as PCP - General (Family Medicine) Mariah Milling Tollie Pizza, MD as PCP - Cardiology (Cardiology) Antonieta Iba, MD as Consulting Physician (Cardiology) Lemar Livings, Merrily Pew, MD (General Surgery) Wyn Quaker Marlow Baars, MD as Referring Physician (Vascular Surgery) Jeralyn Ruths, MD as Consulting Physician (Oncology)   CHIEF COMPLAINT: DCIS, right breast.  Osteoporosis.  INTERVAL HISTORY: Patient returns to clinic today for routine 2-month evaluation and continuation of Prolia.  She completed letrozole in January 2024.  She currently feels well and is asymptomatic. She has no neurologic complaints.  She denies any recent fevers or illnesses.  She has a good appetite and denies weight loss. She denies any chest pain, shortness of breath, cough, or hemoptysis.  She denies any nausea, vomiting, constipation, or diarrhea.  She has no urinary complaints.  Patient offers no specific complaints today.  REVIEW OF SYSTEMS:   Review of Systems  Constitutional: Negative.  Negative for diaphoresis, fever, malaise/fatigue and weight loss.  Respiratory: Negative.  Negative for cough and shortness of breath.   Cardiovascular: Negative.  Negative for chest pain and leg swelling.  Gastrointestinal: Negative.  Negative for abdominal pain and constipation.  Genitourinary: Negative.  Negative for dysuria.  Musculoskeletal: Negative.  Negative for back pain.  Skin: Negative.  Negative for itching and rash.  Neurological: Negative.  Negative for sensory change, focal weakness and weakness.  Psychiatric/Behavioral: Negative.  The patient is not nervous/anxious.     As per HPI. Otherwise, a complete review of systems is negative.  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   CAD (coronary artery disease)    a. 10/2021 NSTEMI/PCI: LM 51m/d, LAD 55ost, 99/ (2.5x15 Onyx Frontier DES), D2 90, LCX 80p/m (2.5x30 Onyx Frontier DES), RCA 50ost, 60p, 17m (nl iFR throughout). LVEDP 20-35mmHg.   Cataract    Bil   Chicken pox    Chronic HFrEF (heart failure with reduced ejection fraction) (HCC)    a. 10/2021 Echo: EF 35-40%, mod asymm LVH w/ sev mid-apical ant, apical HK. GrI DD. Nl RV fxn.   Chronic kidney disease    stage 3   Colon polyp    Constipation    Hyperlipidemia    Hypothyroidism    Ischemic cardiomyopathy    a. 10/2021 Echo: EF 35-40%.   NSTEMI (non-ST elevated myocardial infarction) (HCC) 10/24/2021   Palpitations    over 20 yeras ago   Personal history of radiation therapy    Thyroid disease    Wears hearing aid in both ears     PAST SURGICAL HISTORY: Past Surgical History:  Procedure Laterality Date   ABDOMINAL HYSTERECTOMY  1981   APPENDECTOMY  2012   BREAST BIOPSY Right 05/08/2017   Affirm Bx-DUCTAL CARCINOMA IN SITU (DCIS) WITH ONE FOCUS SUSPICIOUS FOR invasive   BREAST BIOPSY Right 05/17/2017   [place] bx done at DR. Brynetts office, papillary carcinoma   BREAST BIOPSY Right 06/18/2019   Stereo bc calcs COMPATIBLE WITH PRIOR SURGICAL SITE CHANGES   BREAST LUMPECTOMY Right 06/07/2017   DUCTAL CARCINOMA IN SITU (DCIS) with microinvasion  at 7:00 5 cmfn   BREAST LUMPECTOMY Right 06/07/2017   PAPILLARY LESION CONSISTENT WITH PAPILLARY CARCINOMA at retroaerolar 7:00 1.5 cmfn   BREAST LUMPECTOMY WITH SENTINEL LYMPH NODE BIOPSY Right 06/07/2017   Wide excision of 2 foci of high-grade DCIS, microinvasion noted only on core biopsy. 2 microscopic foci lateral inferior margins. Patient elected not to proceed to reexcision.   CATARACT EXTRACTION W/PHACO Right 03/27/2022    Procedure: CATARACT EXTRACTION PHACO AND INTRAOCULAR LENS PLACEMENT (IOC) RIGHT VIVITY LENS;  Surgeon: Nevada Crane, MD;  Location: [facility];  Service: Ophthalmology;  Laterality: Right;  5.49 00:39.1   CATARACT EXTRACTION W/PHACO Left 04/10/2022   Procedure: CATARACT EXTRACTION PHACO AND INTRAOCULAR LENS PLACEMENT (IOC) LEFT VIVITY LENS 3.92 00:32.4;  Surgeon: Nevada Crane, MD;  Location: [facility];  Service: Ophthalmology;  Laterality: Left;   COLONOSCOPY     COLONOSCOPY W/ POLYPECTOMY     CORONARY PRESSURE/FFR STUDY N/A 10/25/2021   Procedure: INTRAVASCULAR PRESSURE WIRE/FFR STUDY;  Surgeon: Yvonne Kendall, MD;  Location: [facility] IN[facility];  Service: Cardiovascular;  Laterality: N/A;   CORONARY STENT INTERVENTION N/A 10/25/2021   Procedure: CORONARY STENT INTERVENTION;  Surgeon: Yvonne Kendall, MD;  Location: [facility];  Service: Cardiovascular;  Laterality: N/A;   CYSTOCELE REPAIR N/A 09/11/2019   Procedure: ANTERIOR COLPORRHAPHY;  Surgeon: Nadara Mustard, MD;  Location: [facility] ORS;  Service: Gynecology;  Laterality: N/A;   FINGER FRACTURE SURGERY Right    5th   HAMMER TOE SURGERY  09/20/2017   JOINT REPLACEMENT     right knee   LEFT HEART CATH AND CORONARY ANGIOGRAPHY N/A 10/25/2021   Procedure: LEFT HEART CATH AND CORONARY ANGIOGRAPHY;  Surgeon: Yvonne Kendall, MD;  Location: [facility] IN[facility] CV [facility];  Service: Cardiovascular;  Laterality: N/A;   ROTATOR CUFF REPAIR Right    TONSILLECTOMY AND ADENOIDECTOMY  1963   TOTAL KNEE ARTHROPLASTY Right 10/17/2016   Procedure: TOTAL KNEE ARTHROPLASTY;  Surgeon: Marcene Corning, MD;  Location: [facility] OR;  Service: Orthopedics;  Laterality: Right;   VAGINAL DELIVERY     3    FAMILY HISTORY: Family History  Problem Relation Age of Onset   Arthritis Mother    Heart disease Mother    Hypertension Mother    Arthritis Father    Heart disease Father    Hypertension Father    Heart attack Father    Stroke  Maternal Grandmother    Cancer Maternal Aunt        abdominal?   Hyperlipidemia Maternal Aunt    Diabetes Paternal Aunt    Glaucoma Brother    Hyperlipidemia Other    Diabetes Other    Breast cancer Neg Hx     ADVANCED DIRECTIVES (Y/N):  N  HEALTH MAINTENANCE: Social History   Tobacco Use   Smoking status: Never   Smokeless tobacco: Never  Vaping Use   Vaping Use: Never used  Substance Use Topics   Alcohol use: Yes    Comment: wine occ   Drug use: No     Colonoscopy:  PAP:  Bone density:  Lipid panel:  Allergies  Allergen Reactions   Singulair [Montelukast] Itching   Lipitor [Atorvastatin] Other (See Comments)    Myalgia, arthralgia   Simvastatin Other (See Comments)    myalgia    Current Outpatient Medications  Medication Sig Dispense Refill   acetaminophen (TYLENOL) 500 MG tablet Take 500 mg by mouth at bedtime. Takes 1- 2 tablets at bed time.  aspirin EC 81 MG tablet Take 81 mg by mouth at bedtime.     Biotin 5 MG TABS Take 10 mg by mouth daily.     calcium-vitamin D (OSCAL WITH D) 500-200 MG-UNIT tablet Take 1 tablet by mouth daily.      carvedilol (COREG) 6.25 MG tablet Take 1 tablet (6.25 mg total) by mouth 2 (two) times daily. 180 tablet 1   cholecalciferol 25 MCG (1000 UT) tablet Take 1 tablet (1,000 Units total) by mouth every evening. 90 tablet 3   clopidogrel (PLAVIX) 75 MG tablet Take 1 tablet (75 mg total) by mouth daily. 90 tablet 1   denosumab (PROLIA) 60 MG/ML SOSY injection Inject 60 mg into the skin every 6 (six) months.     Evolocumab (REPATHA) 140 MG/ML SOSY Inject 140 mg into the skin every 14 (fourteen) days. 6 mL 1   Krill Oil 500 MG CAPS Take 500 mg by mouth daily.     levocetirizine (XYZAL) 5 MG tablet Take 1 tablet (5 mg total) by mouth every evening. 90 tablet 3   LEVOXYL 75 MCG tablet Take 1 tablet by mouth once daily 90 tablet 2   Multiple Vitamin (MULTIVITAMIN WITH MINERALS) TABS tablet Take 1 tablet by mouth daily. Adult 50+      polyethylene glycol powder (GLYCOLAX/MIRALAX) powder Take 17 g by mouth daily. With coffee     sacubitril-valsartan (ENTRESTO) 24-26 MG Take 1 tablet by mouth 2 (two) times daily. 180 tablet 1   spironolactone (ALDACTONE) 25 MG tablet Take 1 tablet (25 mg total) by mouth daily. 90 tablet 1   vitamin B-12 (CYANOCOBALAMIN) 1000 MCG tablet Take 1,000 mcg by mouth daily.     No current facility-administered medications for this visit.    OBJECTIVE: Vitals:   01/04/23 1040  BP: (!) 103/53  Pulse: 61  Resp: 16  Temp: 97.9 F (36.6 C)  SpO2: 99%     Body mass index is 29.89 kg/m.    ECOG FS:0 - Asymptomatic  General: Well-developed, well-nourished, no acute distress. Eyes: Pink conjunctiva, anicteric sclera. HEENT: Normocephalic, moist mucous membranes. Lungs: No audible wheezing or coughing. Heart: Regular rate and rhythm. Abdomen: Soft, nontender, no obvious distention. Musculoskeletal: No edema, cyanosis, or clubbing. Neuro: Alert, answering all questions appropriately. Cranial nerves grossly intact. Skin: No rashes or petechiae noted. Psych: Normal affect.  LAB RESULTS:  Lab Results  Component Value Date   NA 140 01/04/2023   K 4.3 01/04/2023   CL 105 01/04/2023   CO2 26 01/04/2023   GLUCOSE 90 01/04/2023   BUN 23 01/04/2023   CREATININE 1.16 (H) 01/04/2023   CALCIUM 10.0 01/04/2023   PROT 6.6 01/04/2023   ALBUMIN 4.1 01/04/2023   AST 22 01/04/2023   ALT 16 01/04/2023   ALKPHOS 33 (L) 01/04/2023   BILITOT 0.7 01/04/2023   GFRNONAA 48 (L) 01/04/2023   GFRAA 49 (L) 06/01/2017    Lab Results  Component Value Date   WBC 6.4 01/04/2023   NEUTROABS 3.5 01/04/2023   HGB 13.4 01/04/2023   HCT 40.7 01/04/2023   MCV 89.1 01/04/2023   PLT 117 (L) 01/04/2023     STUDIES: No results found.   ASSESSMENT: DCIS, right breast.  Osteoporosis.  PLAN:    DCIS, right breast: Patient underwent lumpectomy on June 07, 2017.  Final pathology was reviewed and no  invasive component was noted, therefore patient did not require chemotherapy.  She completed adjuvant XRT in January 2019.  Because of patient's persistent hot  flashes, tamoxifen was discontinued and patient was initiated on letrozole.  Patient has now completed 5 years of letrozole.  Her most recent mammogram on June 14, 2022 was reported as BI-RADS 2.  Repeat in October 2024.  Return to clinic in 6 months for routine evaluation.  Osteoporosis: Patient's most recent bone mineral density on June 14, 2022 reported T score of -3.1.  This is slightly worse than last year where her T score was -2.7, but unchanged from 2 years ago.  Previously, patient did not receive benefit from Fosamax and was switched to Prolia.  Calcium levels are adequate to proceed with treatment.  Continue calcium and vitamin D supplementation.  Repeat bone mineral density in October 2024 along with mammogram as above. Thrombocytopenia: Chronic and unchanged.  Patient's platelet count is 117.   Patient expressed understanding and was in agreement with this plan. She also understands that She can call clinic at any time with any questions, concerns, or complaints.    Cancer Staging  Ductal carcinoma in situ (DCIS) of right breast Staging form: Breast, AJCC 8th Edition - Clinical stage from 08/22/2017: Stage 0 (cTis (DCIS), cN0, cM0, ER+, PR-, HER2-) - Signed by Jeralyn Ruths, MD on 08/22/2017 Laterality: Right   Jeralyn Ruths, MD   01/04/2023 12:14 PM

## 2023-03-06 DIAGNOSIS — Z6829 Body mass index (BMI) 29.0-29.9, adult: Secondary | ICD-10-CM | POA: Diagnosis not present

## 2023-03-06 DIAGNOSIS — I110 Hypertensive heart disease with heart failure: Secondary | ICD-10-CM | POA: Diagnosis not present

## 2023-03-06 DIAGNOSIS — Z7902 Long term (current) use of antithrombotics/antiplatelets: Secondary | ICD-10-CM | POA: Diagnosis not present

## 2023-03-06 DIAGNOSIS — I509 Heart failure, unspecified: Secondary | ICD-10-CM | POA: Diagnosis not present

## 2023-03-06 DIAGNOSIS — Z7982 Long term (current) use of aspirin: Secondary | ICD-10-CM | POA: Diagnosis not present

## 2023-03-06 DIAGNOSIS — I700 Atherosclerosis of aorta: Secondary | ICD-10-CM | POA: Diagnosis not present

## 2023-03-06 DIAGNOSIS — I739 Peripheral vascular disease, unspecified: Secondary | ICD-10-CM | POA: Diagnosis not present

## 2023-03-12 ENCOUNTER — Ambulatory Visit: Payer: PPO | Admitting: Family Medicine

## 2023-03-20 ENCOUNTER — Encounter (INDEPENDENT_AMBULATORY_CARE_PROVIDER_SITE_OTHER): Payer: Self-pay | Admitting: Vascular Surgery

## 2023-05-10 DIAGNOSIS — H43813 Vitreous degeneration, bilateral: Secondary | ICD-10-CM | POA: Diagnosis not present

## 2023-05-10 DIAGNOSIS — H26493 Other secondary cataract, bilateral: Secondary | ICD-10-CM | POA: Diagnosis not present

## 2023-05-10 DIAGNOSIS — H532 Diplopia: Secondary | ICD-10-CM | POA: Diagnosis not present

## 2023-05-10 DIAGNOSIS — Z961 Presence of intraocular lens: Secondary | ICD-10-CM | POA: Diagnosis not present

## 2023-06-06 ENCOUNTER — Encounter: Payer: Self-pay | Admitting: Cardiovascular Disease

## 2023-06-06 ENCOUNTER — Other Ambulatory Visit: Payer: Self-pay

## 2023-06-06 MED ORDER — SPIRONOLACTONE 25 MG PO TABS: 25.0000 mg | ORAL_TABLET | Freq: Every day | ORAL | 1 refills | Status: DC

## 2023-06-13 ENCOUNTER — Ambulatory Visit: Payer: PPO

## 2023-06-13 DIAGNOSIS — Z23 Encounter for immunization: Secondary | ICD-10-CM | POA: Diagnosis not present

## 2023-06-14 ENCOUNTER — Encounter: Payer: Self-pay | Admitting: Family Medicine

## 2023-06-18 NOTE — Telephone Encounter (Signed)
noted 

## 2023-06-19 ENCOUNTER — Ambulatory Visit
Admission: RE | Admit: 2023-06-19 | Discharge: 2023-06-19 | Disposition: A | Discharge status: Home / Self Care | Payer: PPO | Source: Ambulatory Visit | Attending: Oncology | Admitting: Oncology

## 2023-06-19 DIAGNOSIS — R92323 Mammographic fibroglandular density, bilateral breasts: Secondary | ICD-10-CM | POA: Insufficient documentation

## 2023-06-19 DIAGNOSIS — D0511 Intraductal carcinoma in situ of right breast: Secondary | ICD-10-CM

## 2023-06-19 DIAGNOSIS — M810 Age-related osteoporosis without current pathological fracture: Secondary | ICD-10-CM

## 2023-06-19 DIAGNOSIS — M8589 Other specified disorders of bone density and structure, multiple sites: Secondary | ICD-10-CM | POA: Diagnosis not present

## 2023-06-19 DIAGNOSIS — Z1231 Encounter for screening mammogram for malignant neoplasm of breast: Secondary | ICD-10-CM | POA: Insufficient documentation

## 2023-06-20 NOTE — Telephone Encounter (Signed)
It looks like she is due to see me now.  You can get her scheduled for the next available follow-up slot.

## 2023-06-25 ENCOUNTER — Ambulatory Visit: Payer: PPO | Admitting: Family Medicine

## 2023-07-05 ENCOUNTER — Other Ambulatory Visit: Payer: Self-pay | Admitting: Medical Genetics

## 2023-07-05 ENCOUNTER — Encounter: Payer: Self-pay | Admitting: Oncology

## 2023-07-05 DIAGNOSIS — Z006 Encounter for examination for normal comparison and control in clinical research program: Secondary | ICD-10-CM

## 2023-07-06 ENCOUNTER — Other Ambulatory Visit: Payer: Self-pay | Admitting: *Deleted

## 2023-07-06 DIAGNOSIS — M810 Age-related osteoporosis without current pathological fracture: Secondary | ICD-10-CM

## 2023-07-09 ENCOUNTER — Inpatient Hospital Stay: Payer: PPO

## 2023-07-09 ENCOUNTER — Inpatient Hospital Stay: Payer: PPO | Attending: Oncology | Admitting: Oncology

## 2023-07-09 ENCOUNTER — Encounter: Payer: Self-pay | Admitting: Oncology

## 2023-07-09 VITALS — BP 126/42 | HR 59 | Temp 97.9°F | Resp 16 | Ht 61.0 in | Wt 158.5 lb

## 2023-07-09 DIAGNOSIS — M810 Age-related osteoporosis without current pathological fracture: Secondary | ICD-10-CM | POA: Diagnosis present

## 2023-07-09 DIAGNOSIS — Z86000 Personal history of in-situ neoplasm of breast: Secondary | ICD-10-CM | POA: Insufficient documentation

## 2023-07-09 LAB — CBC WITH DIFFERENTIAL/PLATELET
Abs Immature Granulocytes: 0.02 10*3/uL (ref 0.00–0.07)
Basophils Absolute: 0 10*3/uL (ref 0.0–0.1)
Basophils Relative: 1 %
Eosinophils Absolute: 0.2 10*3/uL (ref 0.0–0.5)
Eosinophils Relative: 3 %
HCT: 43.1 % (ref 36.0–46.0)
Hemoglobin: 14.4 g/dL (ref 12.0–15.0)
Immature Granulocytes: 0 %
Lymphocytes Relative: 37 %
Lymphs Abs: 1.9 10*3/uL (ref 0.7–4.0)
MCH: 30.2 pg (ref 26.0–34.0)
MCHC: 33.4 g/dL (ref 30.0–36.0)
MCV: 90.4 fL (ref 80.0–100.0)
Monocytes Absolute: 0.5 10*3/uL (ref 0.1–1.0)
Monocytes Relative: 9 %
Neutro Abs: 2.6 10*3/uL (ref 1.7–7.7)
Neutrophils Relative %: 50 %
Platelets: 104 10*3/uL — ABNORMAL LOW (ref 150–400)
RBC: 4.77 MIL/uL (ref 3.87–5.11)
RDW: 13.1 % (ref 11.5–15.5)
WBC: 5.2 10*3/uL (ref 4.0–10.5)
nRBC: 0 % (ref 0.0–0.2)

## 2023-07-09 LAB — CMP (CANCER CENTER ONLY)
ALT: 18 U/L (ref 0–44)
AST: 19 U/L (ref 15–41)
Albumin: 4.4 g/dL (ref 3.5–5.0)
Alkaline Phosphatase: 31 U/L — ABNORMAL LOW (ref 38–126)
Anion gap: 10 (ref 5–15)
BUN: 31 mg/dL — ABNORMAL HIGH (ref 8–23)
CO2: 27 mmol/L (ref 22–32)
Calcium: 10 mg/dL (ref 8.9–10.3)
Chloride: 105 mmol/L (ref 98–111)
Creatinine: 1.13 mg/dL — ABNORMAL HIGH (ref 0.44–1.00)
GFR, Estimated: 49 mL/min — ABNORMAL LOW
Glucose, Bld: 93 mg/dL (ref 70–99)
Potassium: 4 mmol/L (ref 3.5–5.1)
Sodium: 142 mmol/L (ref 135–145)
Total Bilirubin: 0.7 mg/dL
Total Protein: 6.9 g/dL (ref 6.5–8.1)

## 2023-07-09 MED ORDER — DENOSUMAB 60 MG/ML ~~LOC~~ SOSY
60.0000 mg | PREFILLED_SYRINGE | Freq: Once | SUBCUTANEOUS | Status: AC
  Administered 2023-07-09: 60 mg via SUBCUTANEOUS
  Filled 2023-07-09: qty 1

## 2023-07-09 NOTE — Progress Notes (Signed)
[facility]  [phone] [phone]  ID: Gwendolyn King OB: Jul 21, 1941  MR#: [id number]  AOZ#:[id number]  Patient Care Team: Glori Luis, MD as PCP - General (Family Medicine) Mariah Milling Tollie Pizza, MD as PCP - Cardiology (Cardiology) Antonieta Iba, MD as Consulting Physician (Cardiology) Lemar Livings, Merrily Pew, MD (General Surgery) Wyn Quaker Marlow Baars, MD as Referring Physician (Vascular Surgery) Jeralyn Ruths, MD as Consulting Physician (Oncology)   CHIEF COMPLAINT: History of DCIS.  Osteoporosis.  INTERVAL HISTORY: Patient returns to clinic today for routine 89-month evaluation and continuation of Prolia. She completed letrozole in January 2024.  She has chronic back and hip pain, but otherwise feels well.  She has no neurologic complaints. She denies any recent fevers or illnesses.  She has a good appetite and denies weight loss. She denies any chest pain, shortness of breath, cough, or hemoptysis.  She denies any nausea, vomiting, constipation, or diarrhea.  She has no urinary complaints.  Patient offers no further specific complaints today.  REVIEW OF SYSTEMS:   Review of Systems  Constitutional: Negative.  Negative for diaphoresis, fever, malaise/fatigue and weight loss.  Respiratory: Negative.  Negative for cough and shortness of breath.   Cardiovascular: Negative.  Negative for chest pain and leg swelling.  Gastrointestinal: Negative.  Negative for abdominal pain and constipation.  Genitourinary: Negative.  Negative for dysuria.  Musculoskeletal:  Positive for back pain and joint pain.  Skin: Negative.  Negative for itching and rash.  Neurological: Negative.  Negative for sensory change, focal weakness and weakness.  Psychiatric/Behavioral: Negative.  The patient is not nervous/anxious.     As per HPI. Otherwise, a complete review of systems is negative.  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   CAD (coronary artery disease)    a. 10/2021 NSTEMI/PCI: LM 91m/d, LAD 55ost, 99/ (2.5x15 Onyx Frontier DES), D2 90, LCX 80p/m (2.5x30 Onyx Frontier DES), RCA 50ost, 60p, 78m (nl iFR throughout). LVEDP 20-71mmHg.   Cataract    Bil   Chicken pox    Chronic HFrEF (heart failure with reduced ejection fraction) (HCC)    a. 10/2021 Echo: EF 35-40%, mod asymm LVH w/ sev mid-apical ant, apical HK. GrI DD. Nl RV fxn.   Chronic kidney disease    stage 3   Colon polyp    Constipation    Hyperlipidemia    Hypothyroidism    Ischemic cardiomyopathy    a. 10/2021 Echo: EF 35-40%.   NSTEMI (non-ST elevated myocardial infarction) (HCC) 10/24/2021   Palpitations    over 20 yeras ago   Personal history of radiation therapy    Thyroid disease    Wears hearing aid in both ears     PAST SURGICAL HISTORY: Past Surgical History:  Procedure Laterality Date   ABDOMINAL HYSTERECTOMY  1981   APPENDECTOMY  2012   BREAST BIOPSY Right 05/08/2017   Affirm Bx-DUCTAL CARCINOMA IN SITU (DCIS) WITH ONE FOCUS SUSPICIOUS FOR invasive   BREAST BIOPSY Right 05/17/2017   [place] bx done at DR. Brynetts office, papillary carcinoma   BREAST BIOPSY Right 06/18/2019   Stereo bc calcs COMPATIBLE WITH PRIOR SURGICAL SITE CHANGES   BREAST LUMPECTOMY Right 06/07/2017   DUCTAL  CARCINOMA IN SITU (DCIS) with microinvasion at 7:00 5 cmfn   BREAST LUMPECTOMY Right 06/07/2017   PAPILLARY LESION CONSISTENT WITH PAPILLARY CARCINOMA at retroaerolar 7:00 1.5 cmfn   BREAST LUMPECTOMY WITH SENTINEL LYMPH NODE BIOPSY Right 06/07/2017   Wide excision of 2 foci of high-grade DCIS, microinvasion noted only on core biopsy. 2 microscopic foci lateral inferior margins. Patient elected not to proceed to reexcision.   CATARACT EXTRACTION  W/PHACO Right 03/27/2022   Procedure: CATARACT EXTRACTION PHACO AND INTRAOCULAR LENS PLACEMENT (IOC) RIGHT VIVITY LENS;  Surgeon: Nevada Crane, MD;  Location: [facility];  Service: Ophthalmology;  Laterality: Right;  5.49 00:39.1   CATARACT EXTRACTION W/PHACO Left 04/10/2022   Procedure: CATARACT EXTRACTION PHACO AND INTRAOCULAR LENS PLACEMENT (IOC) LEFT VIVITY LENS 3.92 00:32.4;  Surgeon: Nevada Crane, MD;  Location: [facility];  Service: Ophthalmology;  Laterality: Left;   COLONOSCOPY     COLONOSCOPY W/ POLYPECTOMY     CORONARY PRESSURE/FFR STUDY N/A 10/25/2021   Procedure: INTRAVASCULAR PRESSURE WIRE/FFR STUDY;  Surgeon: Yvonne Kendall, MD;  Location: [facility] INV[facility] CV [facility];  Service: Cardiovascular;  Laterality: N/A;   CORONARY STENT INTERVENTION N/A 10/25/2021   Procedure: CORONARY STENT INTERVENTION;  Surgeon: Yvonne Kendall, MD;  Location: [facility] CV [facility];  Service: Cardiovascular;  Laterality: N/A;   CYSTOCELE REPAIR N/A 09/11/2019   Procedure: ANTERIOR COLPORRHAPHY;  Surgeon: Nadara Mustard, MD;  Location: [facility] ORS;  Service: Gynecology;  Laterality: N/A;   FINGER FRACTURE SURGERY Right    5th   HAMMER TOE SURGERY  09/20/2017   JOINT REPLACEMENT     right knee   LEFT HEART CATH AND CORONARY ANGIOGRAPHY N/A 10/25/2021   Procedure: LEFT HEART CATH AND CORONARY ANGIOGRAPHY;  Surgeon: Yvonne Kendall, MD;  Location: [facility] CV LAB;  Service: Cardiovascular;  Laterality: N/A;   ROTATOR CUFF REPAIR Right    TONSILLECTOMY AND ADENOIDECTOMY  1963   TOTAL KNEE ARTHROPLASTY Right 10/17/2016   Procedure: TOTAL KNEE ARTHROPLASTY;  Surgeon: Marcene Corning, MD;  Location: [facility];  Service: Orthopedics;  Laterality: Right;   VAGINAL DELIVERY     3    FAMILY HISTORY: Family History  Problem Relation Age of Onset   Arthritis Mother    Heart disease Mother    Hypertension Mother    Arthritis Father    Heart disease Father    Hypertension Father    Heart  attack Father    Stroke Maternal Grandmother    Cancer Maternal Aunt        abdominal?   Hyperlipidemia Maternal Aunt    Diabetes Paternal Aunt    Glaucoma Brother    Hyperlipidemia Other    Diabetes Other    Breast cancer Neg Hx     ADVANCED DIRECTIVES (Y/N):  N  HEALTH MAINTENANCE: Social History   Tobacco Use   Smoking status: Never   Smokeless tobacco: Never  Vaping Use   Vaping status: Never Used  Substance Use Topics   Alcohol use: Yes    Comment: wine occ   Drug use: No     Colonoscopy:  PAP:  Bone density:  Lipid panel:  Allergies  Allergen Reactions   Singulair [Montelukast] Itching   Lipitor [Atorvastatin] Other (See Comments)    Myalgia, arthralgia   Simvastatin Other (See Comments)    myalgia    Current Outpatient Medications  Medication Sig Dispense Refill   acetaminophen (TYLENOL) 500 MG tablet Take 500 mg by mouth at bedtime. Takes 1- 2 tablets  at bed time.     aspirin EC 81 MG tablet Take 81 mg by mouth at bedtime.     Biotin 5 MG TABS Take 10 mg by mouth daily.     calcium-vitamin D (OSCAL WITH D) 500-200 MG-UNIT tablet Take 1 tablet by mouth daily.      carvedilol (COREG) 6.25 MG tablet Take 1 tablet (6.25 mg total) by mouth 2 (two) times daily. 180 tablet 1   cholecalciferol 25 MCG (1000 UT) tablet Take 1 tablet (1,000 Units total) by mouth every evening. 90 tablet 3   clopidogrel (PLAVIX) 75 MG tablet Take 1 tablet (75 mg total) by mouth daily. 90 tablet 1   denosumab (PROLIA) 60 MG/ML SOSY injection Inject 60 mg into the skin every 6 (six) months.     Evolocumab (REPATHA) 140 MG/ML SOSY Inject 140 mg into the skin every 14 (fourteen) days. 6 mL 1   Krill Oil 500 MG CAPS Take 500 mg by mouth daily.     levocetirizine (XYZAL) 5 MG tablet Take 1 tablet (5 mg total) by mouth every evening. 90 tablet 3   LEVOXYL 75 MCG tablet Take 1 tablet by mouth once daily 90 tablet 2   Multiple Vitamin (MULTIVITAMIN WITH MINERALS) TABS tablet Take 1 tablet  by mouth daily. Adult 50+     polyethylene glycol powder (GLYCOLAX/MIRALAX) powder Take 17 g by mouth daily. With coffee     sacubitril-valsartan (ENTRESTO) 24-26 MG Take 1 tablet by mouth 2 (two) times daily. 180 tablet 1   spironolactone (ALDACTONE) 25 MG tablet Take 1 tablet (25 mg total) by mouth daily. 90 tablet 1   vitamin B-12 (CYANOCOBALAMIN) 1000 MCG tablet Take 1,000 mcg by mouth daily.     No current facility-administered medications for this visit.    OBJECTIVE: Vitals:   07/09/23 1051  BP: (!) 126/42  Pulse: (!) 59  Resp: 16  Temp: 97.9 F (36.6 C)  SpO2: 100%     Body mass index is 29.95 kg/m.    ECOG FS:0 - Asymptomatic  General: Well-developed, well-nourished, no acute distress. Eyes: Pink conjunctiva, anicteric sclera. HEENT: Normocephalic, moist mucous membranes. Lungs: No audible wheezing or coughing. Heart: Regular rate and rhythm. Abdomen: Soft, nontender, no obvious distention. Musculoskeletal: No edema, cyanosis, or clubbing. Neuro: Alert, answering all questions appropriately. Cranial nerves grossly intact. Skin: No rashes or petechiae noted. Psych: Normal affect.  LAB RESULTS:  Lab Results  Component Value Date   NA 142 07/09/2023   K 4.0 07/09/2023   CL 105 07/09/2023   CO2 27 07/09/2023   GLUCOSE 93 07/09/2023   BUN 31 (H) 07/09/2023   CREATININE 1.13 (H) 07/09/2023   CALCIUM 10.0 07/09/2023   PROT 6.9 07/09/2023   ALBUMIN 4.4 07/09/2023   AST 19 07/09/2023   ALT 18 07/09/2023   ALKPHOS 31 (L) 07/09/2023   BILITOT 0.7 07/09/2023   GFRNONAA 49 (L) 07/09/2023   GFRAA 49 (L) 06/01/2017    Lab Results  Component Value Date   WBC 5.2 07/09/2023   NEUTROABS 2.6 07/09/2023   HGB 14.4 07/09/2023   HCT 43.1 07/09/2023   MCV 90.4 07/09/2023   PLT 104 (L) 07/09/2023     STUDIES: MM 3D SCREENING MAMMOGRAM BILATERAL BREAST  Result Date: 06/21/2023 CLINICAL DATA:  Screening. EXAM: DIGITAL SCREENING BILATERAL MAMMOGRAM WITH  TOMOSYNTHESIS AND CAD TECHNIQUE: Bilateral screening digital craniocaudal and mediolateral oblique mammograms were obtained. Bilateral screening digital breast tomosynthesis was performed. The images were evaluated with computer-aided detection.  COMPARISON:  Previous exam(s). ACR Breast Density Category b: There are scattered areas of fibroglandular density. FINDINGS: There are no findings suspicious for malignancy. IMPRESSION: No mammographic evidence of malignancy. A result letter of this screening mammogram will be mailed directly to the patient. RECOMMENDATION: Screening mammogram in one year. (Code:SM-B-01Y) BI-RADS CATEGORY  1: Negative. Electronically Signed   By: Norva Pavlov M.D.   On: 06/21/2023 16:02   DG Bone Density  Result Date: 06/19/2023 EXAM: DUAL X-RAY ABSORPTIOMETRY (DXA) FOR BONE MINERAL DENSITY IMPRESSION: Your patient Gwendolyn King completed a BMD test on 06/19/2023 using the [employer] iDXA DXA System (software version: 14.10) manufactured by [employer]. The following summarizes the results of our evaluation. Technologist:VLM PATIENT BIOGRAPHICAL: Name: Gwendolyn, King Patient ID: [id number] Birth Date: [birth year] Height: 61.0 in. Gender: Female Exam Date: 06/19/2023 Weight: 158.2 lbs. Indications: Postmenopausal, Hysterectomy, Osteoporotic, Hypothyroid, History of Breast Cancer, High Risk Meds, Advanced Age, Height Loss, Right knee replacement, History of Radiation, Caucasian Fractures: Treatments: aspirin 81 mg, calcium w/ vit D, Fosamax, Prolia, Vitamin D DENSITOMETRY RESULTS: Site         Region     Measured Date Measured Age WHO Classification Young Adult T-score BMD         %Change vs. Previous Significant Change (*) Left Forearm Radius 33% 06/19/2023 80.4 Osteoporosis -2.6 0.651 g/cm2 7.2% Yes Left Forearm Radius 33% 06/14/2022 79.4 Osteoporosis -3.1 0.607 g/cm2 -5.6% Yes Left Forearm Radius 33% 06/14/2021 78.4 Osteoporosis -2.7 0.643 g/cm2 6.3% Yes Left Forearm Radius 33%  06/10/2020 77.4 Osteoporosis -3.1 0.605 g/cm2 -5.9% Yes Left Forearm Radius 33% 06/10/2019 76.4 Osteoporosis -2.7 0.643 g/cm2 -3.5% - Left Forearm Radius 33% 06/06/2018 75.3 Osteopenia -2.4 0.666 g/cm2 -5.1% - Left Forearm Radius 33% 05/17/2016 73.3 Osteopenia -2.0 0.702 g/cm2 - - DualFemur Neck Right 06/19/2023 80.4 Osteopenia -1.2 0.869 g/cm2 6.2% - DualFemur Neck Right 06/14/2022 79.4 Osteopenia -1.6 0.818 g/cm2 -3.4% - DualFemur Neck Right 06/14/2021 78.4 Osteopenia -1.4 0.847 g/cm2 0.5% - DualFemur Neck Right 06/10/2020 77.4 Osteopenia -1.4 0.843 g/cm2 2.4% - DualFemur Neck Right 06/10/2019 76.4 Osteopenia -1.5 0.823 g/cm2 0.7% - DualFemur Neck Right 06/06/2018 75.3 Osteopenia -1.6 0.817 g/cm2 -0.5% - DualFemur Neck Right 05/17/2016 73.3 Osteopenia -1.6 0.821 g/cm2 - - DualFemur Total Mean 06/19/2023 80.4 Normal -0.6 0.933 g/cm2 2.4% Yes DualFemur Total Mean 06/14/2022 79.4 Normal -0.8 0.911 g/cm2 0.2% - DualFemur Total Mean 06/14/2021 78.4 Normal -0.8 0.909 g/cm2 2.0% - DualFemur Total Mean 06/10/2020 77.4 Normal -0.9 0.891 g/cm2 -0.2% - DualFemur Total Mean 06/10/2019 76.4 Normal -0.9 0.893 g/cm2 0.3% - DualFemur Total Mean 06/06/2018 75.3 Normal -0.9 0.890 g/cm2 -1.4% - DualFemur Total Mean 05/17/2016 73.3 Normal -0.8 0.903 g/cm2 - - ASSESSMENT: The BMD measured at Forearm Radius 33% is 0.651 g/cm2 with a T-score of -2.6. This patient is considered osteoporotic according to World Health Organization [facility]) criteria. Lumbar spine was not utilized due to advanced degenerative changes ans scoliosis. Compared with prior study, there has been significant increase in the total hip. The scan quality is good. World Science writer [facility]) criteria for post-menopausal, Caucasian Women: Normal:                   T-score at or above -1 SD Osteopenia/low bone mass: T-score between -1 and -2.5 SD Osteoporosis:             T-score at or below -2.5 SD RECOMMENDATIONS: 1. All patients should optimize calcium and vitamin D  intake. 2. Consider FDA-approved medical therapies in postmenopausal  women and men aged 54 years and older, based on the following: a. A hip or vertebral(clinical or morphometric) fracture b. T-score < -2.5 at the femoral neck or spine after appropriate evaluation to exclude secondary causes c. Low bone mass (T-score between -1.0 and -2.5 at the femoral neck or spine) and a 10-year probability of a hip fracture > 3% or a 10-year probability of a major osteoporosis-related fracture > 20% based on the [place]-adapted WHO algorithm 3. Clinician judgment and/or patient preferences may indicate treatment for people with 10-year fracture probabilities above or below these levels FOLLOW-UP: People with diagnosed cases of osteoporosis or at high risk for fracture should have regular bone mineral density tests. For patients eligible for Medicare, routine testing is allowed once every 2 years. The testing frequency can be increased to one year for patients who have rapidly progressing disease, those who are receiving or discontinuing medical therapy to restore bone mass, or have additional risk factors. I have reviewed this report, and agree with the above findings. [facility] Electronically Signed   By: Romona Curls M.D.   On: 06/19/2023 14:33     ASSESSMENT: History of DCIS.  Osteoporosis.  PLAN:    Osteoporosis: Patient's most recent bone mineral density on June 19, 2023 reported T-score of -2.6 which is significantly improved than 1 year prior where her T-score supported -3.1.  Patient previously was on Fosamax with no apparent benefit and was switched to Prolia.  Calcium levels are adequate to proceed with treatment today.  Return to clinic in 6 months for laboratory work, further evaluation, and continuation of treatment.  Repeat bone mineral density in October 2025.  Continue calcium and vitamin D supplementation. History of DCIS, right breast:  Patient underwent lumpectomy on June 07, 2017.   Final pathology was reviewed and no invasive component was noted, therefore patient did not require chemotherapy.  She completed adjuvant XRT in January 2019.  Because of patient's persistent hot flashes, tamoxifen was discontinued and patient was initiated on letrozole.  Patient completed 5 years of letrozole in January 2024.  Her most recent mammogram on June 19, 2023 was reported as BI-RADS 1.  Repeat in October 2025.   Renal insufficiency: Chronic and unchanged.  Patient's most recent creatinine is 1.13. Thrombocytopenia: Chronic and unchanged.  Patient's platelet count has ranged between 90 and 150 since February 2018.  Today's result is 104.   Patient expressed understanding and was in agreement with this plan. She also understands that She can call clinic at any time with any questions, concerns, or complaints.    Cancer Staging  Ductal carcinoma in situ (DCIS) of right breast Staging form: Breast, AJCC 8th Edition - Clinical stage from 08/22/2017: Stage 0 (cTis (DCIS), cN0, cM0, ER+, PR-, HER2-) - Signed by Jeralyn Ruths, MD on 08/22/2017 Laterality: Right   Jeralyn Ruths, MD   07/09/2023 1:06 PM

## 2023-07-09 NOTE — Progress Notes (Signed)
Has questions about scoliosis and what to do about her lower back pain.

## 2023-07-10 ENCOUNTER — Other Ambulatory Visit: Payer: PPO

## 2023-07-13 ENCOUNTER — Encounter: Payer: Self-pay | Admitting: Family Medicine

## 2023-07-13 ENCOUNTER — Ambulatory Visit (INDEPENDENT_AMBULATORY_CARE_PROVIDER_SITE_OTHER): Payer: PPO | Admitting: Family Medicine

## 2023-07-13 ENCOUNTER — Other Ambulatory Visit: Payer: PPO

## 2023-07-13 VITALS — BP 116/74 | HR 65 | Temp 98.2°F | Ht 61.0 in | Wt 157.6 lb

## 2023-07-13 DIAGNOSIS — R55 Syncope and collapse: Secondary | ICD-10-CM | POA: Diagnosis not present

## 2023-07-13 DIAGNOSIS — G8929 Other chronic pain: Secondary | ICD-10-CM

## 2023-07-13 DIAGNOSIS — E782 Mixed hyperlipidemia: Secondary | ICD-10-CM

## 2023-07-13 DIAGNOSIS — M545 Low back pain, unspecified: Secondary | ICD-10-CM

## 2023-07-13 DIAGNOSIS — E039 Hypothyroidism, unspecified: Secondary | ICD-10-CM | POA: Diagnosis not present

## 2023-07-13 HISTORY — DX: Syncope and collapse: R55

## 2023-07-13 NOTE — Assessment & Plan Note (Addendum)
A single occurrence of syncope many months ago.  Discussed this is likely related to her blood pressure dropping.  Orthostatics completed today are not give.  EKG completed and is reassuring.  Recent CBC and CMP are generally reassuring.  Suspect patient had an orthostatic event or vasovagal episode based on history.  She will monitor for recurrence and if she has recurrent syncope she will seek medical attention immediately.

## 2023-07-13 NOTE — Assessment & Plan Note (Addendum)
Chronic issue.  Check TSH.  Continue Levoxyl 75 mcg daily.

## 2023-07-13 NOTE — Addendum Note (Signed)
Addended by: Jarvis Morgan D on: 07/13/2023 02:00 PM   Modules accepted: Orders

## 2023-07-13 NOTE — Progress Notes (Signed)
Gwendolyn Alar, MD Phone: [phone]  PAX VIALL is a 82 y.o. female who presents today for follow-up.  Chronic low back pain: Patient notes this has been bothering her for quite some time.  She does note last year she missed a step on a ladder and sat down in the driveway.  That did not cause any pain.  She notes no radiation, numbness, or weakness in her legs.  She notes Tylenol is helpful for her back.  She does note her left hip hurts occasionally particularly when going up steps.  Hypothyroidism: She is on Levoxyl.  She notes no heat or cold intolerance.  Syncope: Patient reports a syncopal episode on Mother's Day.  She was getting out of the shower and started to feel lightheaded as the heat hit her.  She then fell to the ground.  She does not remember falling to the ground though she was sitting down when she came to.  She notes no preceding chest pain, shortness breath, or palpitations.  Social History   Tobacco Use  Smoking Status Never  Smokeless Tobacco Never    Current Outpatient Medications on File Prior to Visit  Medication Sig Dispense Refill   acetaminophen (TYLENOL) 500 MG tablet Take 500 mg by mouth at bedtime. Takes 1- 2 tablets at bed time.     aspirin EC 81 MG tablet Take 81 mg by mouth at bedtime.     Biotin 5 MG TABS Take 10 mg by mouth daily.     calcium-vitamin D (OSCAL WITH D) 500-200 MG-UNIT tablet Take 1 tablet by mouth daily.      carvedilol (COREG) 6.25 MG tablet Take 1 tablet (6.25 mg total) by mouth 2 (two) times daily. 180 tablet 1   cholecalciferol 25 MCG (1000 UT) tablet Take 1 tablet (1,000 Units total) by mouth every evening. 90 tablet 3   clopidogrel (PLAVIX) 75 MG tablet Take 1 tablet (75 mg total) by mouth daily. 90 tablet 1   denosumab (PROLIA) 60 MG/ML SOSY injection Inject 60 mg into the skin every 6 (six) months.     Evolocumab (REPATHA) 140 MG/ML SOSY Inject 140 mg into the skin every 14 (fourteen) days. 6 mL 1   Krill Oil 500 MG CAPS  Take 500 mg by mouth daily.     levocetirizine (XYZAL) 5 MG tablet Take 1 tablet (5 mg total) by mouth every evening. 90 tablet 3   LEVOXYL 75 MCG tablet Take 1 tablet by mouth once daily 90 tablet 2   Multiple Vitamin (MULTIVITAMIN WITH MINERALS) TABS tablet Take 1 tablet by mouth daily. Adult 50+     polyethylene glycol powder (GLYCOLAX/MIRALAX) powder Take 17 g by mouth daily. With coffee     sacubitril-valsartan (ENTRESTO) 24-26 MG Take 1 tablet by mouth 2 (two) times daily. 180 tablet 1   spironolactone (ALDACTONE) 25 MG tablet Take 1 tablet (25 mg total) by mouth daily. 90 tablet 1   vitamin B-12 (CYANOCOBALAMIN) 1000 MCG tablet Take 1,000 mcg by mouth daily.     No current facility-administered medications on file prior to visit.     ROS see history of present illness  Objective  Physical Exam Vitals:   07/13/23 1310  BP: 116/74  Pulse: 65  Temp: 98.2 F (36.8 C)  SpO2: 99%   Laying blood pressure 144/81 pulse 62 Sitting blood pressure 135/77 pulse 63 Standing blood pressure 155/79 pulse 67  BP Readings from Last 3 Encounters:  07/13/23 116/74  07/09/23 (!) 126/42  01/04/23 (!) 103/53   Wt Readings from Last 3 Encounters:  07/13/23 157 lb 9.6 oz (71.5 kg)  07/09/23 158 lb 8 oz (71.9 kg)  01/04/23 158 lb 3.2 oz (71.8 kg)    Physical Exam Constitutional:      General: She is not in acute distress.    Appearance: She is not diaphoretic.  Cardiovascular:     Rate and Rhythm: Normal rate and regular rhythm.     Heart sounds: Normal heart sounds.  Pulmonary:     Effort: Pulmonary effort is normal.     Breath sounds: Normal breath sounds.  Musculoskeletal:     Comments: No midline spine tenderness, no midline spine step-off, no muscular back tenderness, good internal and external range of motion bilateral hips with no discomfort, no tenderness on palpation of her left lateral hip  Skin:    General: Skin is warm and dry.  Neurological:     Mental Status: She is  alert.     Comments: 5/5 strength bilateral quads, hamstrings, plantarflexion, and dorsiflexion, sensation to light touch intact bilateral lower extremities    EKG: Sinus rhythm, rate 62, no ischemic changes, no arrhythmia  Assessment/Plan: Please see individual problem list.  Syncope, unspecified syncope type Assessment & Plan: A single occurrence of syncope many months ago.  Discussed this is likely related to her blood pressure dropping.  Orthostatics completed today are not give.  EKG completed and is reassuring.  Recent CBC and CMP are generally reassuring.  Suspect patient had an orthostatic event or vasovagal episode based on history.  She will monitor for recurrence and if she has recurrent syncope she will seek medical attention immediately.  Orders: -     EKG 12-Lead  Chronic bilateral low back pain without sciatica Assessment & Plan: Chronic issue.  Discussed this is related to degenerative changes and scoliosis in her back.  We will refer for physical therapy.  She can continue over-the-counter Tylenol to help with discomfort.  Orders: -     Ambulatory referral to Physical Therapy  Hypothyroidism, unspecified type Assessment & Plan: Chronic issue.  Check TSH.  Continue Levoxyl 75 mcg daily.  Orders: -     TSH  Mixed hyperlipidemia -     Lipid panel    Return in about 3 months (around 10/13/2023) for Transfer of care.   Gwendolyn Alar, MD [facility]

## 2023-07-13 NOTE — Assessment & Plan Note (Signed)
Chronic issue.  Discussed this is related to degenerative changes and scoliosis in her back.  We will refer for physical therapy.  She can continue over-the-counter Tylenol to help with discomfort.

## 2023-07-13 NOTE — Patient Instructions (Signed)
Nice to see you. We will check your thyroid function today. If you have recurrent syncopal episodes please seek medical attention immediately.

## 2023-07-14 ENCOUNTER — Other Ambulatory Visit: Payer: PPO

## 2023-07-14 LAB — LIPID PANEL
Cholesterol: 114 mg/dL
HDL: 49 mg/dL — ABNORMAL LOW
LDL Cholesterol (Calc): 40 mg/dL
Non-HDL Cholesterol (Calc): 65 mg/dL
Total CHOL/HDL Ratio: 2.3 (calc)
Triglycerides: 177 mg/dL — ABNORMAL HIGH

## 2023-07-14 LAB — TSH: TSH: 1.45 m[IU]/L (ref 0.40–4.50)

## 2023-07-16 ENCOUNTER — Other Ambulatory Visit
Admission: RE | Admit: 2023-07-16 | Discharge: 2023-07-16 | Disposition: A | Discharge status: Home / Self Care | Payer: PPO | Source: Ambulatory Visit | Attending: Medical Genetics | Admitting: Medical Genetics

## 2023-07-16 DIAGNOSIS — Z006 Encounter for examination for normal comparison and control in clinical research program: Secondary | ICD-10-CM | POA: Insufficient documentation

## 2023-07-30 LAB — GENECONNECT MOLECULAR SCREEN: Genetic Analysis Overall Interpretation: NEGATIVE

## 2023-08-06 DIAGNOSIS — I739 Peripheral vascular disease, unspecified: Secondary | ICD-10-CM | POA: Diagnosis not present

## 2023-08-06 DIAGNOSIS — Z7982 Long term (current) use of aspirin: Secondary | ICD-10-CM | POA: Diagnosis not present

## 2023-08-06 DIAGNOSIS — Z6829 Body mass index (BMI) 29.0-29.9, adult: Secondary | ICD-10-CM | POA: Diagnosis not present

## 2023-08-06 DIAGNOSIS — Z853 Personal history of malignant neoplasm of breast: Secondary | ICD-10-CM | POA: Diagnosis not present

## 2023-08-06 DIAGNOSIS — I130 Hypertensive heart and chronic kidney disease with heart failure and stage 1 through stage 4 chronic kidney disease, or unspecified chronic kidney disease: Secondary | ICD-10-CM | POA: Diagnosis not present

## 2023-08-06 DIAGNOSIS — Z923 Personal history of irradiation: Secondary | ICD-10-CM | POA: Diagnosis not present

## 2023-08-06 DIAGNOSIS — N1831 Chronic kidney disease, stage 3a: Secondary | ICD-10-CM | POA: Diagnosis not present

## 2023-08-16 ENCOUNTER — Other Ambulatory Visit: Payer: Self-pay

## 2023-08-16 MED ORDER — CLOPIDOGREL BISULFATE 75 MG PO TABS: 75.0000 mg | ORAL_TABLET | Freq: Every day | ORAL | 1 refills | Status: DC

## 2023-08-20 ENCOUNTER — Other Ambulatory Visit: Payer: Self-pay | Admitting: *Deleted

## 2023-08-20 MED ORDER — CARVEDILOL 6.25 MG PO TABS: 6.2500 mg | ORAL_TABLET | Freq: Two times a day (BID) | ORAL | 0 refills | Status: DC

## 2023-08-20 NOTE — Telephone Encounter (Signed)
Pt scheduled on 2/25

## 2023-08-20 NOTE — Telephone Encounter (Signed)
last office visit: 10/23/22 with plan to f/u in 6 months. next office visit: 10/16/23

## 2023-08-21 DIAGNOSIS — M4726 Other spondylosis with radiculopathy, lumbar region: Secondary | ICD-10-CM | POA: Diagnosis not present

## 2023-08-28 DIAGNOSIS — M4726 Other spondylosis with radiculopathy, lumbar region: Secondary | ICD-10-CM | POA: Diagnosis not present

## 2023-08-30 DIAGNOSIS — M4726 Other spondylosis with radiculopathy, lumbar region: Secondary | ICD-10-CM | POA: Diagnosis not present

## 2023-09-04 DIAGNOSIS — M4726 Other spondylosis with radiculopathy, lumbar region: Secondary | ICD-10-CM | POA: Diagnosis not present

## 2023-09-05 ENCOUNTER — Ambulatory Visit: Payer: PPO | Admitting: *Deleted

## 2023-09-05 VITALS — Ht 60.03 in | Wt 157.0 lb

## 2023-09-05 DIAGNOSIS — Z0000 Encounter for general adult medical examination without abnormal findings: Secondary | ICD-10-CM | POA: Diagnosis not present

## 2023-09-05 NOTE — Progress Notes (Signed)
 Subjective:   Gwendolyn King is a 83 y.o. female who presents for Medicare Annual (Subsequent) preventive examination.  Visit Complete: Virtual I connected with  Gwendolyn King on 09/05/23 by a audio enabled telemedicine application and verified that I am speaking with the correct person using two identifiers.This patient declined Interactive audio and Acupuncturist. Therefore the visit was completed with audio only.   Patient Location: Home  Provider Location: Office/Clinic  I discussed the limitations of evaluation and management by telemedicine. The patient expressed understanding and agreed to proceed.  Vital Signs: Because this visit was a virtual/telehealth visit, some criteria may be missing or patient reported. Any vitals not documented were not able to be obtained and vitals that have been documented are patient reported.   Cardiac Risk Factors include: advanced age (>58men, >40 women);obesity (BMI >30kg/m2);hypertension;dyslipidemia     Objective:    Today's Vitals   09/05/23 1343  Weight: 157 lb (71.2 kg)  Height: 5' 0.03" (1.525 m)   Body mass index is 30.64 kg/m.     09/05/2023    1:59 PM 07/09/2023   10:53 AM 01/04/2023   10:44 AM 09/04/2022    4:38 PM 07/06/2022   10:54 AM 04/10/2022    8:53 AM 01/05/2022   10:34 AM  Advanced Directives  Does Patient Have a Medical Advance Directive? Yes Yes Yes Yes Yes Yes Yes  Type of Estate agent of [place];Living will Healthcare Power of [place];Living will Healthcare Power of [place];Living will Healthcare Power of [place];Living will Healthcare Power of [employer] of [place];Living will Healthcare Power of [place];Living will  Does patient want to make changes to medical advance directive? No - Patient declined   No - Patient declined  No - Patient declined   Copy of Healthcare Power of Attorney in Chart? Yes - validated most recent copy scanned in chart (See row  information)  No - copy requested Yes - validated most recent copy scanned in chart (See row information)  Yes - validated most recent copy scanned in chart (See row information)   Would patient like information on creating a medical advance directive?       No - Patient declined    Current Medications (verified) Outpatient Encounter Medications as of 09/05/2023  Medication Sig   acetaminophen  (TYLENOL ) 500 MG tablet Take 500 mg by mouth at bedtime. Takes 1- 2 tablets at bed time.   aspirin  EC 81 MG tablet Take 81 mg by mouth at bedtime.   Biotin 5 MG TABS Take 10 mg by mouth daily.   calcium -vitamin D  (OSCAL WITH D) 500-200 MG-UNIT tablet Take 1 tablet by mouth daily.    carvedilol  (COREG ) 6.25 MG tablet Take 1 tablet (6.25 mg total) by mouth 2 (two) times daily.   cholecalciferol 25 MCG (1000 UT) tablet Take 1 tablet (1,000 Units total) by mouth every evening.   clopidogrel  (PLAVIX ) 75 MG tablet Take 1 tablet (75 mg total) by mouth daily.   denosumab  (PROLIA ) 60 MG/ML SOSY injection Inject 60 mg into the skin every 6 (six) months.   Evolocumab  (REPATHA ) 140 MG/ML SOSY Inject 140 mg into the skin every 14 (fourteen) days.   levocetirizine (XYZAL ) 5 MG tablet Take 1 tablet (5 mg total) by mouth every evening.   LEVOXYL  75 MCG tablet Take 1 tablet by mouth once daily   Multiple Vitamin (MULTIVITAMIN WITH MINERALS) TABS tablet Take 1 tablet by mouth daily. Adult 50+   Omega-3 Fatty Acids (OMEGA-3 FISH  OIL PO) Take by mouth daily.   polyethylene glycol powder (GLYCOLAX /MIRALAX ) powder Take 17 g by mouth daily. With coffee   sacubitril -valsartan  (ENTRESTO ) 24-26 MG Take 1 tablet by mouth 2 (two) times daily.   spironolactone  (ALDACTONE ) 25 MG tablet Take 1 tablet (25 mg total) by mouth daily.   vitamin B-12 (CYANOCOBALAMIN ) 1000 MCG tablet Take 1,000 mcg by mouth daily.   [DISCONTINUED] Krill Oil 500 MG CAPS Take 500 mg by mouth daily. (Patient not taking: Reported on 09/05/2023)   No  facility-administered encounter medications on file as of 09/05/2023.    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   CAD (coronary artery disease)    a. 10/2021 NSTEMI/PCI: LM 47m/d, LAD 55ost, 99/ (2.5x15 Onyx Frontier DES), D2 90, LCX 80p/m (2.5x30 Onyx Frontier DES), RCA 50ost, 60p, 75m (nl iFR throughout). LVEDP 20-35mmHg.   Cataract    Bil   Chicken pox    Chronic HFrEF (heart failure with reduced ejection fraction) (HCC)    a. 10/2021 Echo: EF 35-40%, mod asymm LVH w/ sev mid-apical ant, apical HK. GrI DD. Nl RV fxn.   Chronic kidney disease    stage 3   Colon polyp    Constipation    Hyperlipidemia    Hypothyroidism    Ischemic cardiomyopathy    a. 10/2021 Echo: EF 35-40%.   NSTEMI (non-ST elevated myocardial infarction) (HCC) 10/24/2021   Palpitations    over 20 yeras ago   Personal history of radiation therapy    Thyroid  disease    Wears hearing aid in both ears    Past Surgical History:  Procedure Laterality Date   ABDOMINAL HYSTERECTOMY  1981   APPENDECTOMY  2012   BREAST BIOPSY Right 05/08/2017   Affirm Bx-DUCTAL CARCINOMA IN SITU (DCIS) WITH ONE FOCUS SUSPICIOUS FOR invasive   BREAST BIOPSY Right 05/17/2017   us  bx done at DR. Brynetts office, papillary carcinoma   BREAST BIOPSY Right 06/18/2019   Stereo bc calcs COMPATIBLE WITH PRIOR SURGICAL SITE CHANGES   BREAST LUMPECTOMY Right 06/07/2017   DUCTAL CARCINOMA IN SITU (DCIS) with microinvasion at 7:00 5 cmfn   BREAST LUMPECTOMY Right 06/07/2017   PAPILLARY LESION CONSISTENT WITH PAPILLARY CARCINOMA at retroaerolar 7:00 1.5 cmfn   BREAST LUMPECTOMY WITH SENTINEL LYMPH NODE BIOPSY Right 06/07/2017    Wide excision of 2 foci of high-grade DCIS, microinvasion noted only on core biopsy. 2 microscopic foci lateral inferior margins. Patient elected not to proceed to reexcision.   CATARACT EXTRACTION W/PHACO Right 03/27/2022   Procedure: CATARACT EXTRACTION PHACO AND INTRAOCULAR LENS PLACEMENT (IOC) RIGHT VIVITY LENS;  Surgeon: Rosa College, MD;  Location: [facility];  Service: Ophthalmology;  Laterality: Right;  5.49 00:39.1   CATARACT EXTRACTION W/PHACO Left 04/10/2022   Procedure: CATARACT EXTRACTION PHACO AND INTRAOCULAR LENS PLACEMENT (IOC) LEFT VIVITY LENS 3.92 00:32.4;  Surgeon: Rosa College, MD;  Location: [facility];  Service: Ophthalmology;  Laterality: Left;   COLONOSCOPY     COLONOSCOPY W/ POLYPECTOMY     CORONARY PRESSURE/FFR STUDY N/A 10/25/2021   Procedure: INTRAVASCULAR PRESSURE WIRE/FFR STUDY;  Surgeon: Sammy Crisp, MD;  Location: [facility];  Service: Cardiovascular;  Laterality: N/A;   CORONARY STENT INTERVENTION N/A 10/25/2021   Procedure: CORONARY STENT INTERVENTION;  Surgeon: Sammy Crisp, MD;  Location: [facility];  Service: Cardiovascular;  Laterality: N/A;   CYSTOCELE REPAIR N/A 09/11/2019   Procedure: ANTERIOR COLPORRHAPHY;  Surgeon: Alben Alma, MD;  Location: [facility] OR[facility];  Service: Gynecology;  Laterality: N/A;   FINGER FRACTURE SURGERY Right    5th   HAMMER TOE SURGERY  09/20/2017   JOINT REPLACEMENT     right knee   LEFT HEART CATH AND CORONARY ANGIOGRAPHY N/A 10/25/2021   Procedure: LEFT HEART CATH AND CORONARY ANGIOGRAPHY;  Surgeon: Sammy Crisp, MD;  Location: [facility];  Service: Cardiovascular;  Laterality: N/A;   ROTATOR CUFF REPAIR Right    TONSILLECTOMY AND ADENOIDECTOMY  1963   TOTAL KNEE ARTHROPLASTY Right 10/17/2016   Procedure: TOTAL KNEE ARTHROPLASTY;  Surgeon: Dayne Even, MD;  Location: [facility];  Service: Orthopedics;  Laterality: Right;   VAGINAL DELIVERY     3   Family History   Problem Relation Age of Onset   Arthritis Mother    Heart disease Mother    Hypertension Mother    Arthritis Father    Heart disease Father    Hypertension Father    Heart attack Father    Stroke Maternal Grandmother    Cancer Maternal Aunt        abdominal?   Hyperlipidemia Maternal Aunt    Diabetes Paternal Aunt    Glaucoma Brother    Hyperlipidemia Other    Diabetes Other    Breast cancer Neg Hx    Social History   Socioeconomic History   Marital status: Married    Spouse name: Not on file   Number of children: Not on file   Years of education: Not on file   Highest education level: Not on file  Occupational History   Not on file  Tobacco Use   Smoking status: Never   Smokeless tobacco: Never  Vaping Use   Vaping status: Never Used  Substance and Sexual Activity   Alcohol use: Yes    Comment: wine occ   Drug use: No   Sexual activity: Not Currently    Birth control/protection: Post-menopausal  Other Topics Concern   Not on file  Social History Narrative   Lives with husband in [place]. 3 children.      Work - retired, Radio producer, Runner, broadcasting/film/video      Diet - regular, limits sugar, eats small meals   Exercise - Jazzercise 4 days per week   Social Drivers of Corporate investment banker Strain: Low Risk  (09/05/2023)   Overall Financial Resource Strain (CARDIA)    Difficulty of Paying Living Expenses: Not hard at all  Food Insecurity: No Food Insecurity (09/05/2023)   Hunger Vital Sign    Worried About Running Out of Food in the Last Year: Never true    Ran Out of Food in the Last Year: Never true  Transportation Needs: No Transportation Needs (09/05/2023)   PRAPARE - Administrator, Civil Service (Medical): No    Lack of Transportation (Non-Medical): No  Physical Activity: Insufficiently Active (09/05/2023)   Exercise Vital Sign    Days of Exercise per Week: 7 days    Minutes of Exercise per Session: 10 min  Stress: No Stress Concern Present  (09/05/2023)   [employer] of Occupational Health - Occupational Stress Questionnaire    Feeling of Stress :  Not at all  Social Connections: Socially Integrated (09/05/2023)   Social Connection and Isolation Panel [NHANES]    Frequency of Communication with Friends and Family: More than three times a week    Frequency of Social Gatherings with Friends and Family: More than three times a week    Attends Religious Services: More than 4 times per year    Active Member of [employer] or Organizations: Yes    Attends Engineer, structural: More than 4 times per year    Marital Status: Married    Tobacco Counseling Counseling given: Not Answered   Clinical Intake:  Pre-visit preparation completed: Yes  Pain : No/denies pain     BMI - recorded: 30.64 Nutritional Status: BMI > 30  Obese Nutritional Risks: None Diabetes: No  How often do you need to have someone help you when you read instructions, pamphlets, or other written materials from your doctor or pharmacy?: 1 - Never  Interpreter Needed?: No  Information entered by :: R. Sky Borboa LPN   Activities of Daily Living    09/05/2023    1:45 PM  In your present state of health, do you have any difficulty performing the following activities:  Hearing? 1  Comment wears aids  Vision? 0  Comment readers  Difficulty concentrating or making decisions? 0  Walking or climbing stairs? 0  Dressing or bathing? 0  Doing errands, shopping? 0  Preparing Food and eating ? N  Using the Toilet? N  In the past six months, have you accidently leaked urine? Y  Comment when she waits too long  Do you have problems with loss of bowel control? N  Managing your Medications? N  Managing your Finances? N  Housekeeping or managing your Housekeeping? N    Patient Care Team: Kent Pear, MD as PCP - General (Family Medicine) Jerelene Monday Deadra Everts, MD as PCP - Cardiology (Cardiology) Devorah Fonder, MD as Consulting Physician  (Cardiology) Marquita Situ, Magali Schmitz, MD (General Surgery) Vonna Guardian Donald Frost, MD as Referring Physician (Vascular Surgery) Shellie Dials, MD as Consulting Physician (Oncology)  Indicate any recent Medical Services you may have received from other than [facility] providers in the past year (date may be approximate).     Assessment:   This is a routine wellness examination for [place].  Hearing/Vision screen Hearing Screening - Comments:: Wears aids Vision Screening - Comments:: readers   Goals Addressed             This Visit's Progress    Patient Stated       Wants to get her back stronger so that she can do more       Depression Screen    09/05/2023    1:53 PM 07/13/2023    1:11 PM 09/11/2022    2:27 PM 09/04/2022    4:39 PM 06/09/2022    2:03 PM 03/08/2022    2:55 PM 01/30/2022    2:53 PM  PHQ 2/9 Scores  PHQ - 2 Score 0 0 0 0 0 0 0  PHQ- 9 Score 2 0 0    4    Fall Risk    09/05/2023    1:48 PM 07/13/2023    1:11 PM 09/11/2022    2:27 PM 09/04/2022    4:43 PM 06/09/2022    2:03 PM  Fall Risk   Falls in the past year? 1 0 0 0 0  Number falls in past yr: 0 0 0 0 0  Injury with Fall?  0 0 0 0 0  Risk for fall due to : History of fall(s);Impaired balance/gait No Fall Risks No Fall Risks  No Fall Risks  Follow up Falls evaluation completed;Falls prevention discussed Falls evaluation completed Falls evaluation completed Falls evaluation completed;Falls prevention discussed Falls evaluation completed    MEDICARE RISK AT HOME: Medicare Risk at Home Any stairs in or around the home?: Yes If so, are there any without handrails?: No Home free of loose throw rugs in walkways, pet beds, electrical cords, etc?: No Adequate lighting in your home to reduce risk of falls?: Yes Life alert?: No Use of a cane, walker or w/c?: No Grab bars in the bathroom?: Yes Shower chair or bench in shower?: Yes Elevated toilet seat or a handicapped toilet?: Yes   Cognitive Function:    04/18/2018     9:30 AM  MMSE - Mini Mental State Exam  Orientation to time 5  Orientation to Place 5  Registration 3  Attention/ Calculation 5  Recall 3  Language- name 2 objects 2  Language- repeat 1  Language- follow 3 step command 3  Language- read & follow direction 1  Write a sentence 1  Copy design 1  Total score 30        09/05/2023    1:59 PM 09/04/2022    4:56 PM 05/16/2019   11:53 AM  6CIT Screen  What Year? 0 points 0 points 0 points  What month? 0 points 0 points 0 points  What time? 0 points 0 points 0 points  Count back from 20 0 points 0 points 0 points  Months in reverse 0 points 0 points 0 points  Repeat phrase 0 points 0 points 0 points  Total Score 0 points 0 points 0 points    Immunizations Immunization History  Administered Date(s) Administered   Fluad Quad(high Dose 65+) 05/13/2019, 06/21/2020, 06/03/2021, 06/15/2022   Fluad Trivalent(High Dose 65+) 06/13/2023   Influenza Split 05/01/2012   Influenza, High Dose Seasonal PF 05/22/2016, 05/11/2017, 05/22/2018, 07/08/2019, 06/22/2020   Influenza,inj,Quad PF,6+ Mos 05/18/2014   Influenza-Unspecified 05/23/2013   Moderna Covid-19 Vaccine Bivalent Booster 27yrs & up 06/08/2022   PFIZER Comirnaty(Gray Top)Covid-19 Tri-Sucrose Vaccine 09/02/2020, 01/11/2021   PFIZER(Purple Top)SARS-COV-2 Vaccination 10/02/2019, 10/28/2019, 06/22/2020   Pfizer Covid-19 Vaccine Bivalent Booster 39yrs & up 06/15/2021   Pneumococcal Conjugate-13 08/21/2010   Pneumococcal Polysaccharide-23 04/10/2014, 05/09/2018, 07/08/2019, 06/22/2020   Tdap 08/21/2010   Zoster Recombinant(Shingrix) 04/04/2019   Zoster, Live 07/05/2008    TDAP status: Due, Education has been provided regarding the importance of this vaccine. Advised may receive this vaccine at local pharmacy or Health Dept. Aware to provide a copy of the vaccination record if obtained from local pharmacy or Health Dept. Verbalized acceptance and understanding.  Flu Vaccine status: Up  to date  Pneumococcal vaccine status: Up to date  Covid-19 vaccine status: Information provided on how to obtain vaccines.   Qualifies for Shingles Vaccine? Yes   Zostavax completed Yes   Shingrix Completed?: No.    Education has been provided regarding the importance of this vaccine. Patient has been advised to call insurance company to determine out of pocket expense if they have not yet received this vaccine. Advised may also receive vaccine at local pharmacy or Health Dept. Verbalized acceptance and understanding.  Screening Tests Health Maintenance  Topic Date Due   Zoster Vaccines- Shingrix (2 of 2) 05/30/2019   Colonoscopy  03/21/2023   COVID-19 Vaccine (8 - 2024-25 season) 04/22/2023   Medicare Annual  Wellness (AWV)  09/05/2023   Pneumonia Vaccine 95+ Years old  Completed   INFLUENZA VACCINE  Completed   DEXA SCAN  Completed   HPV VACCINES  Aged Out   DTaP/Tdap/Td  Discontinued    Health Maintenance  Health Maintenance Due  Topic Date Due   Zoster Vaccines- Shingrix (2 of 2) 05/30/2019   Colonoscopy  03/21/2023   COVID-19 Vaccine (8 - 2024-25 season) 04/22/2023   Medicare Annual Wellness (AWV)  09/05/2023    Colorectal cancer screening: No longer required.   Mammogram status: Completed 05/2023. Repeat every year  Bone Density status: Completed 05/2023. Results reflect: Bone density results: OSTEOPOROSIS. Repeat every 2 years.  Lung Cancer Screening: (Low Dose CT Chest recommended if Age 42-80 years, 20 pack-year currently smoking OR have quit w/in 15years.) does not qualify.     Additional Screening:  Hepatitis C Screening: does not qualify; Completed NA age  Vision Screening: Recommended annual ophthalmology exams for early detection of glaucoma and other disorders of the eye. Is the patient up to date with their annual eye exam?  Yes  Who is the provider or what is the name of the office in which the patient attends annual eye exams? [facility] If pt is  not established with a provider, would they like to be referred to a provider to establish care? No .   Dental Screening: Recommended annual dental exams for proper oral hygiene   Community Resource Referral / Chronic Care Management: CRR required this visit?  No   CCM required this visit?  No     Plan:     I have personally reviewed and noted the following in the patient's chart:   Medical and social history Use of alcohol, tobacco or illicit drugs  Current medications and supplements including opioid prescriptions. Patient is not currently taking opioid prescriptions. Functional ability and status Nutritional status Physical activity Advanced directives List of other physicians Hospitalizations, surgeries, and ER visits in previous 12 months Vitals Screenings to include cognitive, depression, and falls Referrals and appointments  In addition, I have reviewed and discussed with patient certain preventive protocols, quality metrics, and best practice recommendations. A written personalized care plan for preventive services as well as general preventive health recommendations were provided to patient.     Felicitas Horse, LPN   [id number]   After Visit Summary: (MyChart) Due to this being a telephonic visit, the after visit summary with patients personalized plan was offered to patient via MyChart   Nurse Notes: None

## 2023-09-05 NOTE — Patient Instructions (Signed)
 Gwendolyn King , Thank you for taking time to come for your Medicare Wellness Visit. I appreciate your ongoing commitment to your health goals. Please review the following plan we discussed and let me know if I can assist you in the future.   Referrals/Orders/Follow-Ups/Clinician Recommendations: Remember to update your vaccines  This is a list of the screening recommended for you and due dates:  Health Maintenance  Topic Date Due   Zoster (Shingles) Vaccine (2 of 2) 05/30/2019   Colon Cancer Screening  03/21/2023   COVID-19 Vaccine (8 - 2024-25 season) 04/22/2023   Mammogram  06/18/2024   Medicare Annual Wellness Visit  09/04/2024   Pneumonia Vaccine  Completed   Flu Shot  Completed   DEXA scan (bone density measurement)  Completed   HPV Vaccine  Aged Out   DTaP/Tdap/Td vaccine  Discontinued    Advanced directives: (In Chart) A copy of your advanced directives are scanned into your chart should your provider ever need it.  Next Medicare Annual Wellness Visit scheduled for next year: Yes 09/08/24 #@ 2:20

## 2023-09-06 DIAGNOSIS — M4726 Other spondylosis with radiculopathy, lumbar region: Secondary | ICD-10-CM | POA: Diagnosis not present

## 2023-09-11 ENCOUNTER — Encounter: Payer: Self-pay | Admitting: Family Medicine

## 2023-09-11 DIAGNOSIS — M4726 Other spondylosis with radiculopathy, lumbar region: Secondary | ICD-10-CM | POA: Diagnosis not present

## 2023-09-13 NOTE — Telephone Encounter (Signed)
Called pt and she will call back to schedule a new patient appointment on March 5 with provider.

## 2023-09-18 DIAGNOSIS — M4726 Other spondylosis with radiculopathy, lumbar region: Secondary | ICD-10-CM | POA: Diagnosis not present

## 2023-09-25 DIAGNOSIS — M4726 Other spondylosis with radiculopathy, lumbar region: Secondary | ICD-10-CM | POA: Diagnosis not present

## 2023-10-09 ENCOUNTER — Encounter: Payer: Self-pay | Admitting: Family Medicine

## 2023-10-10 ENCOUNTER — Encounter: Payer: Self-pay | Admitting: Nurse Practitioner

## 2023-10-10 ENCOUNTER — Telehealth (INDEPENDENT_AMBULATORY_CARE_PROVIDER_SITE_OTHER): Payer: PPO | Admitting: Nurse Practitioner

## 2023-10-10 VITALS — BP 127/77 | Temp 97.9°F | Ht 60.0 in | Wt 157.0 lb

## 2023-10-10 DIAGNOSIS — J989 Respiratory disorder, unspecified: Secondary | ICD-10-CM

## 2023-10-10 MED ORDER — BENZONATATE 100 MG PO CAPS: 100.0000 mg | ORAL_CAPSULE | Freq: Three times a day (TID) | ORAL | 0 refills | Status: DC | PRN

## 2023-10-10 MED ORDER — DOXYCYCLINE HYCLATE 100 MG PO TABS: 100.0000 mg | ORAL_TABLET | Freq: Two times a day (BID) | ORAL | 0 refills | Status: DC

## 2023-10-10 MED ORDER — PREDNISONE 20 MG PO TABS: 40.0000 mg | ORAL_TABLET | Freq: Every day | ORAL | 0 refills | Status: DC

## 2023-10-10 NOTE — Assessment & Plan Note (Signed)
Hoarseness, cough, and chest congestion have persisted for one week without fever, headache, nausea, or vomiting. Sputum color has changed from greenish to yellow, and there is no wheezing. Blood pressure remains stable. We will start Doxycyline 100 mg twice daily and Prednisone. Benzonatate as needed for symptomatic relief. Continue using over-the-counter cough drops and warm, soothing liquids. Return precautions given to patient, advised in person evaluation if symptoms persist or are worsening.

## 2023-10-10 NOTE — Progress Notes (Signed)
MyChart Video Visit    Virtual Visit via Video Note   This visit type was conducted because this format is felt to be most appropriate for this patient at this time. Physical exam was limited by quality of the video and audio technology used for the visit. CMA was able to get the patient set up on a video visit.  Patient location: Home. Patient and provider in visit Provider location: Home.   I discussed the limitations of evaluation and management by telemedicine and the availability of in person appointments. The patient expressed understanding and agreed to proceed.  Visit Date: 10/10/2023  Today's healthcare provider: Bethanie Dicker, NP     Subjective:    Patient ID: Gwendolyn King, female    DOB: Dec 22, 1940, 83 y.o.   MRN: [id number]  Chief Complaint  Patient presents with   Cough    Hoarseness, chest congestion. Ongoing for about 1 week    HPI  Discussed the use of AI scribe software for clinical note transcription with the patient, who gave verbal consent to proceed.  History of Present Illness   Gwendolyn King is an 83 year old female who presents with hoarseness and chest congestion.  She woke up a week ago unable to speak when her husband asked her a question. Since then, she has experienced hoarseness and chest congestion. Her ability to speak has slightly improved over the past week, but her voice is still not normal.  She describes a persistent cough, which is productive at times. The sputum was greenish yesterday but has since turned yellow. No fever, with a recent temperature reading of 65F. She feels slightly short of breath and reports feeling tired and worn out, especially after running errands. No drainage down her throat, headaches, nausea, or vomiting. She has not been exposed to any known illnesses such as COVID-19 or the flu.  For her symptoms, she has been taking 'Safe Tussin DM', which contains dextromethorphan and guaifenesin, and feels it has helped  prevent her condition from worsening. She is also using cough drops frequently.      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   CAD (coronary artery disease)    a. 10/2021 NSTEMI/PCI: LM 50m/d, LAD 55ost, 99/ (2.5x15 Onyx Frontier DES), D2 90, LCX 80p/m (2.5x30 Onyx Frontier DES), RCA 50ost, 60p, 68m (nl iFR throughout). LVEDP 20-48mmHg.   Cataract    Bil   Chicken pox    Chronic HFrEF (heart failure with reduced ejection fraction) (HCC)    a. 10/2021 Echo: EF 35-40%, mod asymm LVH w/ sev mid-apical ant, apical HK. GrI DD. Nl RV fxn.   Chronic kidney disease    stage 3   Colon polyp    Constipation    Hyperlipidemia    Hypothyroidism    Ischemic cardiomyopathy    a. 10/2021 Echo: EF 35-40%.   NSTEMI (non-ST elevated myocardial infarction) (HCC) 10/24/2021   Palpitations    over 20 yeras ago   Personal history of radiation therapy    Thyroid disease    Wears hearing aid in both ears     Past Surgical History:  Procedure Laterality Date   ABDOMINAL HYSTERECTOMY  1981   APPENDECTOMY  2012  BREAST BIOPSY Right 05/08/2017   Affirm Bx-DUCTAL CARCINOMA IN SITU (DCIS) WITH ONE FOCUS SUSPICIOUS FOR invasive   BREAST BIOPSY Right 05/17/2017   [place] bx done at DR. Brynetts office, papillary carcinoma   BREAST BIOPSY Right 06/18/2019   Stereo bc calcs COMPATIBLE WITH PRIOR SURGICAL SITE CHANGES   BREAST LUMPECTOMY Right 06/07/2017   DUCTAL CARCINOMA IN SITU (DCIS) with microinvasion at 7:00 5 cmfn   BREAST LUMPECTOMY Right 06/07/2017   PAPILLARY LESION CONSISTENT WITH PAPILLARY CARCINOMA at retroaerolar 7:00 1.5 cmfn   BREAST LUMPECTOMY WITH SENTINEL LYMPH NODE BIOPSY Right 06/07/2017   Wide excision of 2 foci of high-grade DCIS, microinvasion noted only on core  biopsy. 2 microscopic foci lateral inferior margins. Patient elected not to proceed to reexcision.   CATARACT EXTRACTION W/PHACO Right 03/27/2022   Procedure: CATARACT EXTRACTION PHACO AND INTRAOCULAR LENS PLACEMENT (IOC) RIGHT VIVITY LENS;  Surgeon: Nevada Crane, MD;  Location: [facility];  Service: Ophthalmology;  Laterality: Right;  5.49 00:39.1   CATARACT EXTRACTION W/PHACO Left 04/10/2022   Procedure: CATARACT EXTRACTION PHACO AND INTRAOCULAR LENS PLACEMENT (IOC) LEFT VIVITY LENS 3.92 00:32.4;  Surgeon: Nevada Crane, MD;  Location: [facility];  Service: Ophthalmology;  Laterality: Left;   COLONOSCOPY     COLONOSCOPY W/ POLYPECTOMY     CORONARY PRESSURE/FFR STUDY N/A 10/25/2021   Procedure: INTRAVASCULAR PRESSURE WIRE/FFR STUDY;  Surgeon: Yvonne Kendall, MD;  Location: [facility];  Service: Cardiovascular;  Laterality: N/A;   CORONARY STENT INTERVENTION N/A 10/25/2021   Procedure: CORONARY STENT INTERVENTION;  Surgeon: Yvonne Kendall, MD;  Location: [facility];  Service: Cardiovascular;  Laterality: N/A;   CYSTOCELE REPAIR N/A 09/11/2019   Procedure: ANTERIOR COLPORRHAPHY;  Surgeon: Nadara Mustard, MD;  Location: [facility];  Service: Gynecology;  Laterality: N/A;   FINGER FRACTURE SURGERY Right    5th   HAMMER TOE SURGERY  09/20/2017   JOINT REPLACEMENT     right knee   LEFT HEART CATH AND CORONARY ANGIOGRAPHY N/A 10/25/2021   Procedure: LEFT HEART CATH AND CORONARY ANGIOGRAPHY;  Surgeon: Yvonne Kendall, MD;  Location: [facility];  Service: Cardiovascular;  Laterality: N/A;   ROTATOR CUFF REPAIR Right    TONSILLECTOMY AND ADENOIDECTOMY  1963   TOTAL KNEE ARTHROPLASTY Right 10/17/2016   Procedure: TOTAL KNEE ARTHROPLASTY;  Surgeon: Marcene Corning, MD;  Location: [facility];  Service: Orthopedics;  Laterality: Right;   VAGINAL DELIVERY     3    Family History  Problem Relation Age of Onset   Arthritis Mother    Heart disease Mother     Hypertension Mother    Arthritis Father    Heart disease Father    Hypertension Father    Heart attack Father    Stroke Maternal Grandmother    Cancer Maternal Aunt        abdominal?   Hyperlipidemia Maternal Aunt    Diabetes Paternal Aunt    Glaucoma Brother    Hyperlipidemia Other    Diabetes Other    Breast cancer Neg Hx     Social History   Socioeconomic History   Marital status: Married    Spouse name: Not on file   Number of children: Not on file   Years of education: Not on file   Highest education level: Not on file  Occupational History   Not on file  Tobacco Use   Smoking status: Never   Smokeless tobacco: Never  Vaping Use  Vaping status: Never Used  Substance and Sexual Activity   Alcohol use: Yes    Comment: wine occ   Drug use: No   Sexual activity: Not Currently    Birth control/protection: Post-menopausal  Other Topics Concern   Not on file  Social History Narrative   Lives with husband in [place]. 3 children.      Work - retired, Radio producer, Runner, broadcasting/film/video      Diet - regular, limits sugar, eats small meals   Exercise - Jazzercise 4 days per week   Social Drivers of Corporate investment banker Strain: Low Risk  (09/05/2023)   Overall Financial Resource Strain (CARDIA)    Difficulty of Paying Living Expenses: Not hard at all  Food Insecurity: No Food Insecurity (09/05/2023)   Hunger Vital Sign    Worried About Running Out of Food in the Last Year: Never true    Ran Out of Food in the Last Year: Never true  Transportation Needs: No Transportation Needs (09/05/2023)   PRAPARE - Administrator, Civil Service (Medical): No    Lack of Transportation (Non-Medical): No  Physical Activity: Insufficiently Active (09/05/2023)   Exercise Vital Sign    Days of Exercise per Week: 7 days    Minutes of Exercise per Session: 10 min  Stress: No Stress Concern Present (09/05/2023)   [employer] of Occupational Health - Occupational Stress  Questionnaire    Feeling of Stress : Not at all  Social Connections: Socially Integrated (09/05/2023)   Social Connection and Isolation Panel [NHANES]    Frequency of Communication with Friends and Family: More than three times a week    Frequency of Social Gatherings with Friends and Family: More than three times a week    Attends Religious Services: More than 4 times per year    Active Member of [employer] or Organizations: Yes    Attends Engineer, structural: More than 4 times per year    Marital Status: Married  Catering manager Violence: Not At Risk (09/05/2023)   Humiliation, Afraid, Rape, and Kick questionnaire    Fear of Current or Ex-Partner: No    Emotionally Abused: No    Physically Abused: No    Sexually Abused: No    Outpatient Medications Prior to Visit  Medication Sig Dispense Refill   acetaminophen (TYLENOL) 500 MG tablet Take 500 mg by mouth at bedtime. Takes 1- 2 tablets at bed time.     aspirin EC 81 MG tablet Take 81 mg by mouth at bedtime.     Biotin 5 MG TABS Take 10 mg by mouth daily.     calcium-vitamin D (OSCAL WITH D) 500-200 MG-UNIT tablet Take 1 tablet by mouth daily.      carvedilol (COREG) 6.25 MG tablet Take 1 tablet (6.25 mg total) by mouth 2 (two) times daily. 180 tablet 0   cholecalciferol 25 MCG (1000 UT) tablet Take 1 tablet (1,000 Units total) by mouth every evening. 90 tablet 3   clopidogrel (PLAVIX) 75 MG tablet Take 1 tablet (75 mg total) by mouth daily. 90 tablet 1   denosumab (PROLIA) 60 MG/ML SOSY injection Inject 60 mg into the skin every 6 (six) months.     Evolocumab (REPATHA) 140 MG/ML SOSY Inject 140 mg into the skin every 14 (fourteen) days. 6 mL 1   levocetirizine (XYZAL) 5 MG tablet Take 1 tablet (5 mg total) by mouth every evening. 90 tablet 3   LEVOXYL 75 MCG tablet Take  1 tablet by mouth once daily 90 tablet 2   Multiple Vitamin (MULTIVITAMIN WITH MINERALS) TABS tablet Take 1 tablet by mouth daily. Adult 50+     Omega-3 Fatty  Acids (OMEGA-3 FISH OIL PO) Take by mouth daily.     polyethylene glycol powder (GLYCOLAX/MIRALAX) powder Take 17 g by mouth daily. With coffee     sacubitril-valsartan (ENTRESTO) 24-26 MG Take 1 tablet by mouth 2 (two) times daily. 180 tablet 1   spironolactone (ALDACTONE) 25 MG tablet Take 1 tablet (25 mg total) by mouth daily. 90 tablet 1   vitamin B-12 (CYANOCOBALAMIN) 1000 MCG tablet Take 1,000 mcg by mouth daily.     No facility-administered medications prior to visit.    Allergies  Allergen Reactions   Singulair [Montelukast] Itching   Lipitor [Atorvastatin] Other (See Comments)    Myalgia, arthralgia   Simvastatin Other (See Comments)    myalgia    ROS See HPI    Objective:    Physical Exam  BP 127/77 Comment: pt reported  Temp 97.9 F (36.6 C) (Oral) Comment: pt reported  Ht 5' (1.524 m)   Wt 157 lb (71.2 kg)   BMI 30.66 kg/m  Wt Readings from Last 3 Encounters:  10/10/23 157 lb (71.2 kg)  09/05/23 157 lb (71.2 kg)  07/13/23 157 lb 9.6 oz (71.5 kg)   GENERAL: alert, oriented, appears well and in no acute distress   HEENT: atraumatic, conjunttiva clear, no obvious abnormalities on inspection of external nose and ears   NECK: normal movements of the head and neck   LUNGS: on inspection no signs of respiratory distress, breathing rate appears normal, no obvious gross SOB, gasping or wheezing   CV: no obvious cyanosis   MS: moves all visible extremities without noticeable abnormality   PSYCH/NEURO: pleasant and cooperative, no obvious depression or anxiety, speech and thought processing grossly intact    Assessment & Plan:   Problem List Items Addressed This Visit       Respiratory   Respiratory illness - Primary   Hoarseness, cough, and chest congestion have persisted for one week without fever, headache, nausea, or vomiting. Sputum color has changed from greenish to yellow, and there is no wheezing. Blood pressure remains stable. We will start  Doxycyline 100 mg twice daily and Prednisone. Benzonatate as needed for symptomatic relief. Continue using over-the-counter cough drops and warm, soothing liquids. Return precautions given to patient, advised in person evaluation if symptoms persist or are worsening.       Relevant Medications   doxycycline (VIBRA-TABS) 100 MG tablet   predniSONE (DELTASONE) 20 MG tablet    I am having Gwendolyn King start on doxycycline, predniSONE, and benzonatate. I am also having her maintain her acetaminophen, aspirin EC, polyethylene glycol powder, cyanocobalamin, calcium-vitamin D, multivitamin with minerals, Biotin, denosumab, cholecalciferol, Entresto, Repatha, levocetirizine, Levoxyl, spironolactone, clopidogrel, carvedilol, and Omega-3 Fatty Acids (OMEGA-3 FISH OIL PO).  Meds ordered this encounter  Medications   doxycycline (VIBRA-TABS) 100 MG tablet    Sig: Take 1 tablet (100 mg total) by mouth 2 (two) times daily.    Dispense:  14 tablet    Refill:  0    Supervising Provider:   Birdie Sons, ERIC G [[id number]]   predniSONE (DELTASONE) 20 MG tablet    Sig: Take 2 tablets (40 mg total) by mouth daily.    Dispense:  10 tablet    Refill:  0    Supervising Provider:   Birdie Sons, ERIC G [[id number]]  benzonatate (TESSALON) 100 MG capsule    Sig: Take 1 capsule (100 mg total) by mouth 3 (three) times daily as needed for cough.    Dispense:  30 capsule    Refill:  0    Supervising Provider:   Birdie Sons, ERIC G [[id number]]   I discussed the assessment and treatment plan with the patient. The patient was provided an opportunity to ask questions and all were answered. The patient agreed with the plan and demonstrated an understanding of the instructions.   The patient was advised to call back or seek an in-person evaluation if the symptoms worsen or if the condition fails to improve as anticipated.   Bethanie Dicker, NP [facility] [phone] (phone) [phone]  (fax)  [facility]

## 2023-10-10 NOTE — Telephone Encounter (Signed)
Spoke with pt and scheduled her for 1 pm today with Bethanie Dicker, NP.

## 2023-10-14 NOTE — Progress Notes (Deleted)
 Cardiology Office Note  Date:  10/14/2023   ID:  Gwendolyn King, Gwendolyn King Dec 14, 1940, MRN [id number]  PCP:  Glori Luis, MD   No chief complaint on file.   HPI:  [name] is a 83 year-old woman with long history of palpitations,  hyperlipidemia,  Aortic atherosclerosis on CT scan 2012 leg edema treated with Lasix as needed,  CAD, Stent placed to left circumflex and LAD Ejection fraction 35 to 40% October 24, 2021 Ejection fraction normalized April 2024 who presents for follow-up of her palpitations, non-STEMI/CAD, PCI, cardiomyopathy  Last seen by myself in clinic 3/24  Echo results Normal left and right ventricular size and function, EF 60% No significant valvular heart disease Ejection fraction now normalized, up from moderately depressed EF 35 to 40% in the past    Amlodipine held, carvedilol started, cardiac rehab ordered following non-STEMI Last seen by myself in clinic 5/23  Husband with dementia, deconditioned  Reports that she feels well, denies significant chest pain concerning for angina No shortness of breath, no significant leg swelling Blood pressure low but denies significant orthostasis symptoms On Coreg, Entresto, spironolactone, not requiring Lasix  Recent imaging reviewed Carotid u/s no significant diseaser/minimal dz  Lab work reviewed A1C 5.1 Cholesterol at goal   EKG personally reviewed by myself on todays visit Normal sinus rhythm rate 60 bpm no significant ST-T wave changes  Other past medical history reviewed Non-STEMI October 24, 2021 Echo showed an EF of 35 to 40% with grade 1 diastolic dysfunction, and severe hypokinesis of the left ventricular, mid-apical anterior, and apical walls, concerning for LAD infarct.   Stent placed to left circumflex and LAD  Diagnostic catheterization was carried out and showed 99% stenosis in the mid LAD with a 90% stenosis in the second diagonal.  She also had an 80% stenosis in the proximal to mid left  circumflex with abnormal fractional flow reserve.  She had nonobstructive disease throughout the right coronary.  She underwent drug-eluting stent placement to the mid LAD and proximal/mid left circumflex.    Post cath, creatinine rose to 1.22 and ACEi/ARB/arni/mra were avoided.  History of statin and Zetia intolerance On Repatha Left thalamic stroke history, on aspirin Plavix Repatha  Lab work reviewed A1c 5.1, total cholesterol 114 LDL 37  Simvastatin,  had myalgias    PMH:   has a past medical history of Allergy, Arthritis, Asthma, Breast cancer (HCC) (05/08/2017), CAD (coronary artery disease), Cataract, Chicken pox, Chronic HFrEF (heart failure with reduced ejection fraction) (HCC), Chronic kidney disease, Colon polyp, Constipation, Hyperlipidemia, Hypothyroidism, Ischemic cardiomyopathy, NSTEMI (non-ST elevated myocardial infarction) (HCC) (10/24/2021), Palpitations, Personal history of radiation therapy, Thyroid disease, and Wears hearing aid in both ears.  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 2 microscopic foci lateral inferior margins. Patient elected not to proceed to reexcision.   CATARACT EXTRACTION W/PHACO Right 03/27/2022    Procedure: CATARACT EXTRACTION PHACO AND INTRAOCULAR LENS PLACEMENT (IOC) RIGHT VIVITY LENS;  Surgeon: Nevada Crane, MD;  Location: [facility];  Service: Ophthalmology;  Laterality: Right;  5.49 00:39.1   CATARACT EXTRACTION W/PHACO Left 04/10/2022   Procedure: CATARACT EXTRACTION PHACO AND INTRAOCULAR LENS PLACEMENT (IOC) LEFT VIVITY LENS 3.92 00:32.4;  Surgeon: Nevada Crane, MD;  Location: [facility];  Service: Ophthalmology;  Laterality: Left;   COLONOSCOPY     COLONOSCOPY W/ POLYPECTOMY     CORONARY PRESSURE/FFR STUDY N/A 10/25/2021   Procedure: INTRAVASCULAR PRESSURE WIRE/FFR STUDY;  Surgeon: Yvonne Kendall, MD;  Location: [facility];  Service: Cardiovascular;  Laterality: N/A;   CORONARY STENT INTERVENTION N/A 10/25/2021   Procedure: CORONARY STENT INTERVENTION;  Surgeon: Yvonne Kendall, MD;  Location: [facility];  Service: Cardiovascular;  Laterality: N/A;   CYSTOCELE REPAIR N/A 09/11/2019   Procedure: ANTERIOR COLPORRHAPHY;  Surgeon: Nadara Mustard, MD;  Location: [facility];  Service: Gynecology;  Laterality: N/A;   FINGER FRACTURE SURGERY Right    5th   HAMMER TOE SURGERY  09/20/2017   JOINT REPLACEMENT     right knee   LEFT HEART CATH AND CORONARY ANGIOGRAPHY N/A 10/25/2021   Procedure: LEFT HEART CATH AND CORONARY ANGIOGRAPHY;  Surgeon: Yvonne Kendall, MD;  Location: [facility];  Service: Cardiovascular;  Laterality: N/A;   ROTATOR CUFF REPAIR Right    TONSILLECTOMY AND ADENOIDECTOMY  1963   TOTAL KNEE ARTHROPLASTY Right 10/17/2016   Procedure: TOTAL KNEE ARTHROPLASTY;  Surgeon: Marcene Corning, MD;  Location: [facility];  Service: Orthopedics;  Laterality: Right;   VAGINAL DELIVERY     3   Current Outpatient Medications on File Prior to Visit  Medication Sig Dispense Refill   acetaminophen (TYLENOL) 500 MG tablet Take 500 mg by mouth at bedtime. Takes 1- 2 tablets at bed time.     aspirin EC 81 MG tablet Take 81 mg by mouth  at bedtime.     benzonatate (TESSALON) 100 MG capsule Take 1 capsule (100 mg total) by mouth 3 (three) times daily as needed for cough. 30 capsule 0   Biotin 5 MG TABS Take 10 mg by mouth daily.     calcium-vitamin D (OSCAL WITH D) 500-200 MG-UNIT tablet Take 1 tablet by mouth daily.      carvedilol (COREG) 6.25 MG tablet Take 1 tablet (6.25 mg total) by mouth 2 (two) times daily. 180 tablet 0   cholecalciferol 25 MCG (1000 UT) tablet Take 1 tablet (1,000 Units total) by mouth every evening. 90 tablet 3   clopidogrel (PLAVIX) 75 MG tablet Take 1 tablet (75 mg total) by mouth daily. 90 tablet 1   denosumab (PROLIA) 60 MG/ML SOSY injection Inject 60 mg into the skin every 6 (six) months.     doxycycline (VIBRA-TABS) 100 MG tablet Take 1 tablet (100 mg total) by mouth 2 (two) times daily. 14 tablet 0   Evolocumab (REPATHA) 140 MG/ML SOSY Inject 140 mg into the skin every 14 (fourteen) days. 6 mL 1   levocetirizine (XYZAL) 5 MG tablet Take 1 tablet (5 mg total) by mouth every evening. 90 tablet 3   LEVOXYL 75 MCG tablet Take 1 tablet by mouth once daily 90 tablet 2   Multiple Vitamin (MULTIVITAMIN WITH MINERALS) TABS tablet Take 1 tablet  by mouth daily. Adult 50+     Omega-3 Fatty Acids (OMEGA-3 FISH OIL PO) Take by mouth daily.     polyethylene glycol powder (GLYCOLAX/MIRALAX) powder Take 17 g by mouth daily. With coffee     predniSONE (DELTASONE) 20 MG tablet Take 2 tablets (40 mg total) by mouth daily. 10 tablet 0   sacubitril-valsartan (ENTRESTO) 24-26 MG Take 1 tablet by mouth 2 (two) times daily. 180 tablet 1   spironolactone (ALDACTONE) 25 MG tablet Take 1 tablet (25 mg total) by mouth daily. 90 tablet 1   vitamin B-12 (CYANOCOBALAMIN) 1000 MCG tablet Take 1,000 mcg by mouth daily.     No current facility-administered medications on file prior to visit.    Allergies:   Lipitor [atorvastatin] (palpitations) and Simvastatin   Social History:  The patient  reports that she has never smoked.  She has never used smokeless tobacco. She reports current alcohol use. She reports that she does not use drugs.   Family History:   family history includes Arthritis in her father and mother; Cancer in her maternal aunt; Diabetes in her paternal aunt and another family member; Glaucoma in her brother; Heart attack in her father; Heart disease in her father and mother; Hyperlipidemia in her maternal aunt and another family member; Hypertension in her father and mother; Stroke in her maternal grandmother.    Review of Systems: Review of Systems  Constitutional: Negative.   HENT: Negative.    Respiratory: Negative.    Cardiovascular: Negative.   Gastrointestinal: Negative.   Musculoskeletal: Negative.   Neurological: Negative.   Psychiatric/Behavioral: Negative.    All other systems reviewed and are negative.   PHYSICAL EXAM: VS:  There were no vitals taken for this visit. , BMI There is no height or weight on file to calculate BMI. Constitutional:  oriented to person, place, and time. No distress.  HENT:  Head: Grossly normal Eyes:  no discharge. No scleral icterus.  Neck: No JVD, no carotid bruits  Cardiovascular: Regular rate and rhythm, no murmurs appreciated Pulmonary/Chest: Clear to auscultation bilaterally, no wheezes or rails Abdominal: Soft.  no distension.  no tenderness.  Musculoskeletal: Normal range of motion Neurological:  normal muscle tone. Coordination normal. No atrophy Skin: Skin warm and dry Psychiatric: normal affect, pleasant  Recent Labs: 07/09/2023: ALT 18; BUN 31; Creatinine 1.13; Hemoglobin 14.4; Platelets 104; Potassium 4.0; Sodium 142 07/13/2023: TSH 1.45    Lipid Panel Lab Results  Component Value Date   CHOL 114 07/13/2023   HDL 49 (L) 07/13/2023   LDLCALC 40 07/13/2023   TRIG 177 (H) 07/13/2023    Wt Readings from Last 3 Encounters:  10/10/23 157 lb (71.2 kg)  09/05/23 157 lb (71.2 kg)  07/13/23 157 lb 9.6 oz (71.5 kg)     ASSESSMENT  AND PLAN:  Coronary artery disease with stable angina Prior stenting March 2023 with associated cardiomyopathy Denies anginal symptoms, no further testing at this time Non-smoker, no significant diabetes, cholesterol at goal  Ischemic cardiomyopathy Prior ejection fraction 35% in March 2023 On carvedilol 6.25 twice daily, Entresto 24/26 mg twice a day.spironolactone 25 mg daily,  Repeat echocardiogram ordered  Essential hypertension - Plan: EKG 12-Lead Add spironolactone as above, continue Coreg and Entresto  Palpitations - Plan: EKG 12-Lead on low-dose beta-blocker, Coreg Denies significant symptoms  Hyperlipidemia Statin intolerance Doing well on Repatha, cholesterol at goal  Obesity She has started exercise program  Aortic atherosclerosis Seen on CT scan, images  Risk factors at goal  Total encounter time more than 30 minutes  Greater than 50% was spent in counseling and coordination of care with the patient    No orders of the defined types were placed in this encounter.    Signed, Dossie Arbour, M.D., Ph.D. 10/14/2023  [facility] [place], [place] [phone]

## 2023-10-16 ENCOUNTER — Encounter: Payer: Self-pay | Admitting: Oncology

## 2023-10-16 ENCOUNTER — Encounter: Payer: Self-pay | Admitting: Cardiovascular Disease

## 2023-10-16 ENCOUNTER — Ambulatory Visit: Payer: PPO | Attending: Cardiovascular Disease | Admitting: Cardiovascular Disease

## 2023-10-16 ENCOUNTER — Ambulatory Visit: Payer: PPO | Admitting: Cardiovascular Disease

## 2023-10-16 ENCOUNTER — Encounter: Payer: PPO | Admitting: Family Medicine

## 2023-10-16 VITALS — BP 110/60 | HR 57 | Ht 60.25 in | Wt 161.0 lb

## 2023-10-16 DIAGNOSIS — I255 Ischemic cardiomyopathy: Secondary | ICD-10-CM

## 2023-10-16 DIAGNOSIS — I2510 Atherosclerotic heart disease of native coronary artery without angina pectoris: Secondary | ICD-10-CM

## 2023-10-16 DIAGNOSIS — I10 Essential (primary) hypertension: Secondary | ICD-10-CM | POA: Diagnosis not present

## 2023-10-16 DIAGNOSIS — I700 Atherosclerosis of aorta: Secondary | ICD-10-CM

## 2023-10-16 DIAGNOSIS — I872 Venous insufficiency (chronic) (peripheral): Secondary | ICD-10-CM

## 2023-10-16 DIAGNOSIS — I639 Cerebral infarction, unspecified: Secondary | ICD-10-CM

## 2023-10-16 DIAGNOSIS — I6523 Occlusion and stenosis of bilateral carotid arteries: Secondary | ICD-10-CM

## 2023-10-16 DIAGNOSIS — I214 Non-ST elevation (NSTEMI) myocardial infarction: Secondary | ICD-10-CM

## 2023-10-16 DIAGNOSIS — E785 Hyperlipidemia, unspecified: Secondary | ICD-10-CM

## 2023-10-16 DIAGNOSIS — T466X5A Adverse effect of antihyperlipidemic and antiarteriosclerotic drugs, initial encounter: Secondary | ICD-10-CM

## 2023-10-16 DIAGNOSIS — N183 Chronic kidney disease, stage 3 unspecified: Secondary | ICD-10-CM

## 2023-10-16 MED ORDER — CARVEDILOL 6.25 MG PO TABS: 6.2500 mg | ORAL_TABLET | Freq: Two times a day (BID) | ORAL | 3 refills | Status: DC

## 2023-10-16 MED ORDER — SPIRONOLACTONE 25 MG PO TABS: 25.0000 mg | ORAL_TABLET | Freq: Every day | ORAL | 3 refills | Status: DC

## 2023-10-16 MED ORDER — SACUBITRIL-VALSARTAN 24-26 MG PO TABS: 1.0000 | ORAL_TABLET | Freq: Two times a day (BID) | ORAL | 3 refills | Status: DC

## 2023-10-16 MED ORDER — CLOPIDOGREL BISULFATE 75 MG PO TABS: 75.0000 mg | ORAL_TABLET | Freq: Every day | ORAL | 3 refills | Status: DC

## 2023-10-16 NOTE — Patient Instructions (Signed)
 Medication Instructions:  OK to hold aspirin Stay on plavix  If you need a refill on your cardiac medications before your next appointment, please call your pharmacy.   Lab work: No new labs needed  Testing/Procedures: No new testing needed  Follow-Up: At [facility], you and your health needs are our priority.  As part of our continuing mission to provide you with exceptional heart care, we have created designated Provider Care Teams.  These Care Teams include your primary Cardiologist (physician) and Advanced Practice Providers (APPs -  Physician Assistants and Nurse Practitioners) who all work together to provide you with the care you need, when you need it.  You will need a follow up appointment in 12 months  Providers on your designated Care Team:   Nicolasa Ducking, NP Eula Listen, PA-C Cadence Fransico Michael, [place]  COVID-19 Vaccine Information can be found at: [email or web address] For questions related to vaccine distribution or appointments, please email [email or web address] or call [phone].

## 2023-10-16 NOTE — Addendum Note (Signed)
 Addended by: Jani Gravel on: 10/16/2023 02:23 PM   Modules accepted: Orders

## 2023-10-16 NOTE — Progress Notes (Signed)
 Cardiology Office Note  Date:  10/16/2023   ID:  Gwendolyn King, Gwendolyn King [birth year], MRN [id number]  PCP:  Glori Luis, MD   Chief Complaint  Patient presents with   6 month follow up     Patient c/o shortness of breath and no energy with just getting over a virus.     HPI:  Gwendolyn King is a 83 year-old woman with long history of palpitations,  hyperlipidemia,  Aortic atherosclerosis on CT scan 2012 leg edema treated with Lasix as needed,  CAD, Stent placed to left circumflex and LAD Ejection fraction 35 to 40% October 24, 2021 Ejection fraction normalized April 2024 who presents for follow-up of her palpitations, non-STEMI/CAD, PCI, cardiomyopathy  Last seen by myself in clinic 3/24  Echo reviewed from April 2024 Normal left and right ventricular size and function, EF 60% No significant valvular heart disease  Ejection fraction  normalized,  up from moderately depressed EF 35 to 40% in 2023  In follow-up today reports she has been doing well Has been sick with viral URI, 2 weeks Made full recovery  Husband with dementia, deconditioned, she cares for him  Lab work reviewed Total cholesterol 114 LDL 40 GFR 49 Normal CBC, TSH  EKG personally reviewed by myself on todays visit EKG Interpretation Date/Time:  Tuesday October 16 2023 14:05:34 EST Ventricular Rate:  57 PR Interval:  152 QRS Duration:  76 QT Interval:  396 QTC Calculation: 385 R Axis:   40  Text Interpretation: Sinus bradycardia When compared with ECG of 26-Oct-2021 10:57, T wave inversion no longer evident in Inferior leads T wave inversion no longer evident in Anterolateral leads QT has shortened Confirmed by Julien Nordmann [phone]) on 10/16/2023 2:07:41 PM   Carotid u/s no significant diseaser/minimal dz  Other past medical history reviewed Non-STEMI October 24, 2021 Echo showed an EF of 35 to 40% with grade 1 diastolic dysfunction, and severe hypokinesis of the left ventricular, mid-apical anterior, and  apical walls, concerning for LAD infarct.   Stent placed to left circumflex and LAD  Diagnostic catheterization was carried out and showed 99% stenosis in the mid LAD with a 90% stenosis in the second diagonal.  She also had an 80% stenosis in the proximal to mid left circumflex with abnormal fractional flow reserve.  She had nonobstructive disease throughout the right coronary.  She underwent drug-eluting stent placement to the mid LAD and proximal/mid left circumflex.    Post cath, creatinine rose to 1.22 and ACEi/ARB/arni/mra were avoided.  History of statin and Zetia intolerance On Repatha Left thalamic stroke history, on aspirin Plavix Repatha  Simvastatin,  had myalgias   PMH:   has a past medical history of Allergy, Arthritis, Asthma, Breast cancer (HCC) (05/08/2017), CAD (coronary artery disease), Cataract, Chicken pox, Chronic HFrEF (heart failure with reduced ejection fraction) (HCC), Chronic kidney disease, Colon polyp, Constipation, Hyperlipidemia, Hypothyroidism, Ischemic cardiomyopathy, NSTEMI (non-ST elevated myocardial infarction) (HCC) (10/24/2021), Palpitations, Personal history of radiation therapy, Thyroid disease, and Wears hearing aid in both ears.  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 2 microscopic foci lateral inferior margins. Patient elected not to proceed to reexcision.   CATARACT EXTRACTION W/PHACO Right 03/27/2022   Procedure: CATARACT EXTRACTION PHACO AND INTRAOCULAR LENS PLACEMENT (IOC) RIGHT VIVITY LENS;  Surgeon: Nevada Crane, MD;  Location: [facility];  Service: Ophthalmology;  Laterality: Right;  5.49 00:39.1   CATARACT EXTRACTION W/PHACO Left 04/10/2022   Procedure: CATARACT EXTRACTION PHACO AND INTRAOCULAR LENS PLACEMENT (IOC) LEFT VIVITY LENS 3.92 00:32.4;  Surgeon: Nevada Crane, MD;  Location: [facility];  Service: Ophthalmology;  Laterality: Left;   COLONOSCOPY     COLONOSCOPY W/ POLYPECTOMY     CORONARY PRESSURE/FFR STUDY N/A 10/25/2021   Procedure: INTRAVASCULAR PRESSURE WIRE/FFR STUDY;  Surgeon: Yvonne Kendall, MD;  Location: [facility];  Service: Cardiovascular;  Laterality: N/A;   CORONARY STENT INTERVENTION N/A 10/25/2021   Procedure: CORONARY STENT INTERVENTION;  Surgeon: Yvonne Kendall, MD;  Location: [facility];  Service: Cardiovascular;  Laterality: N/A;   CYSTOCELE REPAIR N/A 09/11/2019   Procedure: ANTERIOR COLPORRHAPHY;  Surgeon: Nadara Mustard, MD;  Location: [facility];  Service: Gynecology;  Laterality: N/A;   FINGER FRACTURE SURGERY Right    5th   HAMMER TOE SURGERY  09/20/2017   JOINT REPLACEMENT     right knee   LEFT HEART CATH AND CORONARY ANGIOGRAPHY N/A 10/25/2021   Procedure: LEFT HEART CATH AND CORONARY ANGIOGRAPHY;  Surgeon: Yvonne Kendall, MD;  Location: [facility];  Service: Cardiovascular;  Laterality: N/A;   ROTATOR CUFF REPAIR Right    TONSILLECTOMY AND ADENOIDECTOMY  1963   TOTAL KNEE ARTHROPLASTY Right 10/17/2016   Procedure: TOTAL KNEE ARTHROPLASTY;  Surgeon: Marcene Corning, MD;  Location: [facility];  Service: Orthopedics;  Laterality: Right;   VAGINAL DELIVERY     3   Current Outpatient Medications on  File Prior to Visit  Medication Sig Dispense Refill   acetaminophen (TYLENOL) 500 MG tablet Take 500 mg by mouth at bedtime. Takes 1- 2 tablets at bed time.     aspirin EC 81 MG tablet Take 81 mg by mouth at bedtime.     Biotin 5 MG TABS Take 10 mg by mouth daily.     calcium-vitamin D (OSCAL WITH D) 500-200 MG-UNIT tablet Take 1 tablet by mouth daily.      carvedilol (COREG) 6.25 MG tablet Take 1 tablet (6.25 mg total) by mouth 2 (two) times daily. 180 tablet 0   cholecalciferol 25 MCG (1000 UT) tablet Take 1 tablet (1,000 Units total) by mouth every evening. 90 tablet 3   clopidogrel (PLAVIX) 75 MG tablet Take 1 tablet (75 mg total) by mouth daily. 90 tablet 1   denosumab (PROLIA) 60 MG/ML SOSY injection Inject 60 mg into the skin every 6 (six) months.     Evolocumab (REPATHA) 140 MG/ML SOSY Inject 140 mg into the skin every 14 (fourteen) days. 6 mL 1   levocetirizine (XYZAL) 5 MG tablet Take 1 tablet (5 mg total) by mouth every evening. 90 tablet 3   LEVOXYL 75 MCG tablet Take 1 tablet by mouth once daily 90 tablet 2   Multiple Vitamin (  MULTIVITAMIN WITH MINERALS) TABS tablet Take 1 tablet by mouth daily. Adult 50+     Omega-3 Fatty Acids (OMEGA-3 FISH OIL PO) Take by mouth daily.     polyethylene glycol powder (GLYCOLAX/MIRALAX) powder Take 17 g by mouth daily. With coffee     sacubitril-valsartan (ENTRESTO) 24-26 MG Take 1 tablet by mouth 2 (two) times daily. 180 tablet 1   spironolactone (ALDACTONE) 25 MG tablet Take 1 tablet (25 mg total) by mouth daily. 90 tablet 1   vitamin B-12 (CYANOCOBALAMIN) 1000 MCG tablet Take 1,000 mcg by mouth daily.     No current facility-administered medications on file prior to visit.    Allergies:   Lipitor [atorvastatin] (palpitations) and Simvastatin   Social History:  The patient  reports that she has never smoked. She has never used smokeless tobacco. She reports current alcohol use. She reports that she does not use drugs.   Family History:    family history includes Arthritis in her father and mother; Cancer in her maternal aunt; Diabetes in her paternal aunt and another family member; Glaucoma in her brother; Heart attack in her father; Heart disease in her father and mother; Hyperlipidemia in her maternal aunt and another family member; Hypertension in her father and mother; Stroke in her maternal grandmother.   Review of Systems: Review of Systems  Constitutional: Negative.   HENT: Negative.    Respiratory: Negative.    Cardiovascular: Negative.   Gastrointestinal: Negative.   Musculoskeletal: Negative.   Neurological: Negative.   Psychiatric/Behavioral: Negative.    All other systems reviewed and are negative.  PHYSICAL EXAM: VS:  BP 110/60 (BP Location: Left Arm, Patient Position: Sitting, Cuff Size: Normal)   Pulse (!) 57   Ht 5' 0.25" (1.53 m)   Wt 161 lb (73 kg)   SpO2 98%   BMI 31.18 kg/m  , BMI Body mass index is 31.18 kg/m. Constitutional:  oriented to person, place, and time. No distress.  HENT:  Head: Grossly normal Eyes:  no discharge. No scleral icterus.  Neck: No JVD, no carotid bruits  Cardiovascular: Regular rate and rhythm, no murmurs appreciated Pulmonary/Chest: Clear to auscultation bilaterally, no wheezes or rails Abdominal: Soft.  no distension.  no tenderness.  Musculoskeletal: Normal range of motion Neurological:  normal muscle tone. Coordination normal. No atrophy Skin: Skin warm and dry Psychiatric: normal affect, pleasant  Recent Labs: 07/09/2023: ALT 18; BUN 31; Creatinine 1.13; Hemoglobin 14.4; Platelets 104; Potassium 4.0; Sodium 142 07/13/2023: TSH 1.45    Lipid Panel Lab Results  Component Value Date   CHOL 114 07/13/2023   HDL 49 (L) 07/13/2023   LDLCALC 40 07/13/2023   TRIG 177 (H) 07/13/2023    Wt Readings from Last 3 Encounters:  10/16/23 161 lb (73 kg)  10/10/23 157 lb (71.2 kg)  09/05/23 157 lb (71.2 kg)     ASSESSMENT AND PLAN:  Coronary artery disease with  stable angina Prior stenting March 2023 with associated cardiomyopathy Ejection fraction normalized in 2024 Blood pressure well-controlled Currently with no symptoms of angina. No further workup at this time. Continue current medication regimen. Non-smoker, no significant diabetes, cholesterol at goal  Ischemic cardiomyopathy Prior ejection fraction 35% in March 2023 On carvedilol 6.25 twice daily, Entresto 24/26 mg twice a day.spironolactone 25 mg daily, ejection fraction normalized 55%  Essential hypertension - Plan: EKG 12-Lead Blood pressure well-controlled, recommend she continue  spironolactone  Coreg and Entresto  Palpitations - Plan: EKG 12-Lead on low-dose beta-blocker, Coreg Asymptomatic  Hyperlipidemia  Statin intolerance, tolerating Repatha, cholesterol at goal  Obesity She has started exercise program  Aortic atherosclerosis Seen on CT scan, images  Risk factors at goal   Orders Placed This Encounter  Procedures   EKG 12-Lead     Signed, Dossie Arbour, M.D., Ph.D. 10/16/2023  [facility] [place], [place] [phone]

## 2023-11-23 ENCOUNTER — Other Ambulatory Visit: Payer: Self-pay

## 2023-11-23 ENCOUNTER — Emergency Department
Admission: EM | Admit: 2023-11-23 | Discharge: 2023-11-23 | Disposition: A | Discharge status: Home / Self Care | Attending: Emergency Medicine | Admitting: Emergency Medicine

## 2023-11-23 ENCOUNTER — Emergency Department

## 2023-11-23 DIAGNOSIS — W010XXA Fall on same level from slipping, tripping and stumbling without subsequent striking against object, initial encounter: Secondary | ICD-10-CM | POA: Diagnosis not present

## 2023-11-23 DIAGNOSIS — S0592XA Unspecified injury of left eye and orbit, initial encounter: Secondary | ICD-10-CM | POA: Diagnosis present

## 2023-11-23 DIAGNOSIS — Z23 Encounter for immunization: Secondary | ICD-10-CM | POA: Insufficient documentation

## 2023-11-23 DIAGNOSIS — H05232 Hemorrhage of left orbit: Secondary | ICD-10-CM

## 2023-11-23 DIAGNOSIS — S0012XA Contusion of left eyelid and periocular area, initial encounter: Secondary | ICD-10-CM | POA: Insufficient documentation

## 2023-11-23 DIAGNOSIS — M542 Cervicalgia: Secondary | ICD-10-CM | POA: Insufficient documentation

## 2023-11-23 DIAGNOSIS — S0083XA Contusion of other part of head, initial encounter: Secondary | ICD-10-CM | POA: Insufficient documentation

## 2023-11-23 MED ORDER — TETANUS-DIPHTH-ACELL PERTUSSIS 5-2.5-18.5 LF-MCG/0.5 IM SUSY
0.5000 mL | PREFILLED_SYRINGE | Freq: Once | INTRAMUSCULAR | Status: AC
  Administered 2023-11-23: 0.5 mL via INTRAMUSCULAR
  Filled 2023-11-23: qty 0.5

## 2023-11-23 NOTE — ED Triage Notes (Signed)
 Pt reports she tripped and fell on her face, pt has large hematoma to above left eye. Pt denies LOC. Pt states she takes plavix. Small lac to hematoma.

## 2023-11-23 NOTE — Discharge Instructions (Signed)
 No acute injuries inside your head or evidence of skull fracture.  You have bruising around the left eye which should resolve within the next several days to 2 weeks.  You can apply ice to the area as tolerated.

## 2023-11-23 NOTE — ED Provider Notes (Signed)
 [facility] Provider Note    Event Date/Time   First MD Initiated Contact with Patient 11/23/23 0315     (approximate)   History   Fall   HPI Gwendolyn King is a 83 y.o. female presenting today for fall.  Patient states she was up walking when she tripped over something on the ground and fell down hitting the left frontal part of her face.  She noted bleeding from the side of her face.  Denied loss of consciousness.  Is on Plavix.  Notes some generalized neck pain as well.  Otherwise no injuries elsewhere to her arms, legs, torso or back.     Physical Exam   Triage Vital Signs: ED Triage Vitals  Encounter Vitals Group     BP 11/23/23 0056 (!) 208/90     Systolic BP Percentile --      Diastolic BP Percentile --      Pulse Rate 11/23/23 0056 78     Resp 11/23/23 0056 18     Temp 11/23/23 0056 98.4 F (36.9 C)     Temp src --      SpO2 11/23/23 0056 98 %     Weight 11/23/23 0055 150 lb (68 kg)     Height 11/23/23 0055 5' (1.524 m)     Head Circumference --      Peak Flow --      Pain Score 11/23/23 0055 4     Pain Loc --      Pain Education --      Exclude from Growth Chart --     Most recent vital signs: Vitals:   11/23/23 0056  BP: (!) 208/90  Pulse: 78  Resp: 18  Temp: 98.4 F (36.9 C)  SpO2: 98%   I have reviewed the vital signs. General:  Awake, alert, no acute distress. Head:  Normocephalic, large left-sided periorbital hematoma and left temporal hematoma with abrasion.  No active bleeding. EENT:  PERRL, EOMI, no conjunctival injection, no pain with extraocular motion, Oral mucosa pink and moist, Neck is supple. Cardiovascular: Regular rate, 2+ distal pulses. Respiratory:  Normal respiratory effort, symmetrical expansion, no distress.   Extremities:  Moving all four extremities through full ROM without pain.   Neuro:  Alert and oriented.  Interacting appropriately.   Skin:  Warm, dry, no rash.   Psych: Appropriate affect.     ED Results / Procedures / Treatments   Labs (all labs ordered are listed, but only abnormal results are displayed) Labs Reviewed - No data to display   EKG    RADIOLOGY Independently interpreted CT head and C-spine with no acute intracranial pathology.  Evidence of soft tissue hematoma but no retrobulbar hematoma   PROCEDURES:  Critical Care performed: No  Procedures   MEDICATIONS ORDERED IN ED: Medications  Tdap (BOOSTRIX) injection 0.5 mL (has no administration in time range)     IMPRESSION / MDM / ASSESSMENT AND PLAN / ED COURSE  I reviewed the triage vital signs and the nursing notes.                              Differential diagnosis includes, but is not limited to, ICH, cervical spine injury, soft tissue hematoma  Patient's presentation is most consistent with acute complicated illness / injury requiring diagnostic workup.  Patient is an 83 year old female presenting today for ground-level fall with head injury.  She is on Plavix  but no loss of consciousness.  Denies any acute vision changes and no pain with extraocular motion.  No proptosis.  Small abrasion to left temporal region which is hemostatic.  CT imaging of head and C-spine shows no acute intracranial or intracervical pathology.  No retrobulbar hematoma.  Patient otherwise with vision intact and safe for discharge at this time.  Recommended ice as tolerated to the area.  Follow-up with PCP.  Given strict return precautions.  Tetanus shot updated today.     FINAL CLINICAL IMPRESSION(S) / ED DIAGNOSES   Final diagnoses:  Periorbital hematoma of left eye     Rx / DC Orders   ED Discharge Orders     None        Note:  This document was prepared using Dragon voice recognition software and may include unintentional dictation errors.   Janith Lima, MD 11/23/23 [phone]

## 2023-11-27 ENCOUNTER — Telehealth: Payer: Self-pay

## 2023-11-27 NOTE — Transitions of Care (Post Inpatient/ED Visit) (Signed)
 11/27/2023  Name: Gwendolyn King MRN: [id number] DOB: Dec 10, 1940  Today's TOC FU Call Status: Today's TOC FU Call Status:: Successful TOC FU Call Completed TOC FU Call Complete Date: 11/27/23 Patient's Name and Date of Birth confirmed.  Transition Care Management Follow-up Telephone Call Date of Discharge: 11/23/23 Discharge Facility: [facility]) Type of Discharge: Emergency Department Reason for ED Visit: Other: (fall) How have you been since you were released from the hospital?: Better Any questions or concerns?: No  Items Reviewed: Did you receive and understand the discharge instructions provided?: Yes Medications obtained,verified, and reconciled?: Yes (Medications Reviewed) Any new allergies since your discharge?: No Dietary orders reviewed?: Yes Do you have support at home?: Yes People in Home [RPT]: spouse  Medications Reviewed Today: Medications Reviewed Today     Reviewed by Karena Addison, LPN (Licensed Practical Nurse) on 11/27/23 at 1545  Med List Status: <None>   Medication Order Taking? Sig Documenting Provider Last Dose Status Informant  acetaminophen (TYLENOL) 500 MG tablet [id number] No Take 500 mg by mouth at bedtime. Takes 1- 2 tablets at bed time. [provider] Taking Active Self  aspirin EC 81 MG tablet [id number] No Take 81 mg by mouth at bedtime. [provider] Taking Active Self  Biotin 5 MG TABS [id number] No Take 10 mg by mouth daily. [provider] Taking Active Self  calcium-vitamin D (OSCAL WITH D) 500-200 MG-UNIT tablet [id number] No Take 1 tablet by mouth daily.  [provider] Taking Active Self  carvedilol (COREG) 6.25 MG tablet [id number]  Take 1 tablet (6.25 mg total) by mouth 2 (two) times daily. Antonieta Iba, MD  Active   cholecalciferol 25 MCG (1000 UT) tablet [id number] No Take 1 tablet (1,000 Units total) by mouth every evening. Creig Hines, NP Taking Active    clopidogrel (PLAVIX) 75 MG tablet [id number]  Take 1 tablet (75 mg total) by mouth daily. Antonieta Iba, MD  Active   denosumab (PROLIA) 60 MG/ML SOSY injection [id number] No Inject 60 mg into the skin every 6 (six) months. [provider] Taking Active   Evolocumab (REPATHA) 140 MG/ML SOSY [id number] No Inject 140 mg into the skin every 14 (fourteen) days. Glori Luis, MD Taking Active   levocetirizine (XYZAL) 5 MG tablet [id number] No Take 1 tablet (5 mg total) by mouth every evening. Glori Luis, MD Taking Active   LEVOXYL 75 MCG tablet [id number] No Take 1 tablet by mouth once daily Glori Luis, MD Taking Active   Multiple Vitamin (MULTIVITAMIN WITH MINERALS) TABS tablet [id number] No Take 1 tablet by mouth daily. Adult 50+ [provider] Taking Active Self  Omega-3 Fatty Acids (OMEGA-3 FISH OIL PO) [id number] No Take by mouth daily. [provider] Taking Active   polyethylene glycol powder (GLYCOLAX/MIRALAX) powder [id number] No Take 17 g by mouth daily. With coffee [provider] Taking Active Self  sacubitril-valsartan (ENTRESTO) 24-26 MG [id number]  Take 1 tablet by mouth 2 (two) times daily. Antonieta Iba, MD  Active   spironolactone (ALDACTONE) 25 MG tablet [id number]  Take 1 tablet (25 mg total) by mouth daily. Antonieta Iba, MD  Active   vitamin B-12 (CYANOCOBALAMIN) 1000 MCG tablet [id number] No Take 1,000 mcg by mouth daily. [provider] Taking Active Self            Home Care and Equipment/Supplies: Were Home Health Services Ordered?: NA Any new equipment or medical supplies ordered?: NA  Functional  Questionnaire: Do you need assistance with bathing/showering or dressing?: No Do you need assistance with meal preparation?: No Do you need assistance with eating?: No Do you have difficulty maintaining continence: No Do you need assistance with getting out of bed/getting out of a chair/moving?: No Do  you have difficulty managing or taking your medications?: No  Follow up appointments reviewed: PCP Follow-up appointment confirmed?: Yes Date of PCP follow-up appointment?: 11/30/23 Follow-up Provider: [facility] Follow-up appointment confirmed?: NA Do you need transportation to your follow-up appointment?: No Do you understand care options if your condition(s) worsen?: Yes-patient verbalized understanding    SIGNATURE Karena Addison, LPN [facility] Nurse Health Advisor Direct Dial [phone]

## 2023-11-30 ENCOUNTER — Encounter: Payer: Self-pay | Admitting: Nurse Practitioner

## 2023-11-30 ENCOUNTER — Ambulatory Visit (INDEPENDENT_AMBULATORY_CARE_PROVIDER_SITE_OTHER): Admitting: Nurse Practitioner

## 2023-11-30 VITALS — BP 120/56 | HR 61 | Temp 97.8°F | Ht 60.0 in | Wt 159.8 lb

## 2023-11-30 DIAGNOSIS — H05232 Hemorrhage of left orbit: Secondary | ICD-10-CM | POA: Diagnosis not present

## 2023-11-30 NOTE — Progress Notes (Signed)
 Bethanie Dicker, NP-C Phone: [phone]  Gwendolyn King is a 83 y.o. female who presents today for hospital follow up.   Discussed the use of AI scribe software for clinical note transcription with the patient, who gave verbal consent to proceed.  History of Present Illness   Gwendolyn King is an 83 year old female who presents for a hospital follow-up after a fall.   On April 3rd, she experienced a fall after tripping over a box in her utility room. She visited the emergency room on April 4th, where a CT scan of her head and neck was performed due to her anticoagulation therapy, revealing no fractures. She sustained a significant bruise, particularly around her eye, which initially affected her vision, giving everything a pink cast. Her vision has since improved, and the bruising is healing, now appearing green and yellow. No headaches have been reported.  She experienced dizziness once when standing up too quickly after waking up. She has a history of poor balance, which has not worsened since the fall. No persistent dizziness or lightheadedness. Her eye, which was initially very purple, has improved significantly. Normal sensation when touched on her face and vision has improved since the fall.      Social History   Tobacco Use  Smoking Status Never  Smokeless Tobacco Never    Current Outpatient Medications on File Prior to Visit  Medication Sig Dispense Refill   acetaminophen (TYLENOL) 500 MG tablet Take 500 mg by mouth at bedtime. Takes 1- 2 tablets at bed time.     aspirin EC 81 MG tablet Take 81 mg by mouth at bedtime.     Biotin 5 MG TABS Take 10 mg by mouth daily.     calcium-vitamin D (OSCAL WITH D) 500-200 MG-UNIT tablet Take 1 tablet by mouth daily.      carvedilol (COREG) 6.25 MG tablet Take 1 tablet (6.25 mg total) by mouth 2 (two) times daily. 180 tablet 3   cholecalciferol 25 MCG (1000 UT) tablet Take 1 tablet (1,000 Units total) by mouth every evening. 90 tablet 3    clopidogrel (PLAVIX) 75 MG tablet Take 1 tablet (75 mg total) by mouth daily. 90 tablet 3   denosumab (PROLIA) 60 MG/ML SOSY injection Inject 60 mg into the skin every 6 (six) months.     Evolocumab (REPATHA) 140 MG/ML SOSY Inject 140 mg into the skin every 14 (fourteen) days. 6 mL 1   levocetirizine (XYZAL) 5 MG tablet Take 1 tablet (5 mg total) by mouth every evening. 90 tablet 3   LEVOXYL 75 MCG tablet Take 1 tablet by mouth once daily 90 tablet 2   Multiple Vitamin (MULTIVITAMIN WITH MINERALS) TABS tablet Take 1 tablet by mouth daily. Adult 50+     Omega-3 Fatty Acids (OMEGA-3 FISH OIL PO) Take by mouth daily.     polyethylene glycol powder (GLYCOLAX/MIRALAX) powder Take 17 g by mouth daily. With coffee     sacubitril-valsartan (ENTRESTO) 24-26 MG Take 1 tablet by mouth 2 (two) times daily. 180 tablet 3   spironolactone (ALDACTONE) 25 MG tablet Take 1 tablet (25 mg total) by mouth daily. 90 tablet 3   vitamin B-12 (CYANOCOBALAMIN) 1000 MCG tablet Take 1,000 mcg by mouth daily.     No current facility-administered medications on file prior to visit.     ROS see history of present illness  Objective  Physical Exam Vitals:   11/30/23 1318  BP: (!) 120/56  Pulse: 61  Temp: 97.8 F (  36.6 C)  SpO2: 95%    BP Readings from Last 3 Encounters:  11/30/23 (!) 120/56  11/23/23 (!) 160/66  10/16/23 110/60   Wt Readings from Last 3 Encounters:  11/30/23 159 lb 12.8 oz (72.5 kg)  11/23/23 150 lb (68 kg)  10/16/23 161 lb (73 kg)    Physical Exam Constitutional:      General: She is not in acute distress.    Appearance: Normal appearance.  HENT:     Head: Normocephalic.     Comments: Large left periorbital, temporal and facial hematoma- see pic below Cardiovascular:     Rate and Rhythm: Normal rate and regular rhythm.     Heart sounds: Normal heart sounds.  Pulmonary:     Effort: Pulmonary effort is normal.     Breath sounds: Normal breath sounds.  Skin:    General: Skin is  warm and dry.  Neurological:     General: No focal deficit present.     Mental Status: She is alert.  Psychiatric:        Mood and Affect: Mood normal.        Behavior: Behavior normal.      Assessment/Plan: Please see individual problem list.  Periorbital hematoma of left eye Assessment & Plan: The bruise is healing well with no fractures or complications. Vision is improving, and there are no reports of dizziness or headaches. Apply ice to reduce swelling and discoloration. Monitor for severe headaches, vision changes, or speech and memory issues. Bruising may persist for another week or two. Return precautions given to patient.      Return in 3 months (on 02/19/2024), or if symptoms worsen or fail to improve, for TOC as scheduled.   Bethanie Dicker, NP-C [facility]

## 2023-11-30 NOTE — Assessment & Plan Note (Signed)
 The bruise is healing well with no fractures or complications. Vision is improving, and there are no reports of dizziness or headaches. Apply ice to reduce swelling and discoloration. Monitor for severe headaches, vision changes, or speech and memory issues. Bruising may persist for another week or two. Return precautions given to patient.

## 2023-12-03 ENCOUNTER — Encounter: Payer: PPO | Admitting: Family Medicine

## 2023-12-28 ENCOUNTER — Other Ambulatory Visit: Payer: Self-pay | Admitting: Nurse Practitioner

## 2023-12-28 NOTE — Telephone Encounter (Signed)
 Dr. Cammy Center pt. As this is not a Cardiac RX, does Dr. Marvel Slicker want to refill? Please advise.

## 2023-12-31 ENCOUNTER — Telehealth: Payer: Self-pay | Admitting: *Deleted

## 2023-12-31 NOTE — Telephone Encounter (Signed)
 I did not see anything in the computer that someone had sent a message to them I told the patient probably it is that computer sends messages to make sure that patients know their appointments.  She says that is probably what it is and she does have an appointment on 5/19 and she already knows about it and the time.

## 2024-01-04 ENCOUNTER — Other Ambulatory Visit: Payer: Self-pay | Admitting: *Deleted

## 2024-01-04 DIAGNOSIS — M810 Age-related osteoporosis without current pathological fracture: Secondary | ICD-10-CM

## 2024-01-07 ENCOUNTER — Encounter: Payer: Self-pay | Admitting: Oncology

## 2024-01-07 ENCOUNTER — Inpatient Hospital Stay: Payer: PPO | Attending: Oncology

## 2024-01-07 ENCOUNTER — Inpatient Hospital Stay: Payer: PPO | Admitting: Oncology

## 2024-01-07 ENCOUNTER — Inpatient Hospital Stay: Payer: PPO

## 2024-01-07 VITALS — BP 120/50 | HR 75 | Temp 97.7°F | Resp 16 | Wt 158.0 lb

## 2024-01-07 DIAGNOSIS — M810 Age-related osteoporosis without current pathological fracture: Secondary | ICD-10-CM | POA: Insufficient documentation

## 2024-01-07 DIAGNOSIS — Z853 Personal history of malignant neoplasm of breast: Secondary | ICD-10-CM | POA: Insufficient documentation

## 2024-01-07 LAB — CBC WITH DIFFERENTIAL/PLATELET
Abs Immature Granulocytes: 0.02 10*3/uL (ref 0.00–0.07)
Basophils Absolute: 0 10*3/uL (ref 0.0–0.1)
Basophils Relative: 1 %
Eosinophils Absolute: 0.3 10*3/uL (ref 0.0–0.5)
Eosinophils Relative: 4 %
HCT: 40.8 % (ref 36.0–46.0)
Hemoglobin: 13.8 g/dL (ref 12.0–15.0)
Immature Granulocytes: 0 %
Lymphocytes Relative: 42 %
Lymphs Abs: 2.5 10*3/uL (ref 0.7–4.0)
MCH: 30.1 pg (ref 26.0–34.0)
MCHC: 33.8 g/dL (ref 30.0–36.0)
MCV: 88.9 fL (ref 80.0–100.0)
Monocytes Absolute: 0.5 10*3/uL (ref 0.1–1.0)
Monocytes Relative: 8 %
Neutro Abs: 2.7 10*3/uL (ref 1.7–7.7)
Neutrophils Relative %: 45 %
Platelets: 111 10*3/uL — ABNORMAL LOW (ref 150–400)
RBC: 4.59 MIL/uL (ref 3.87–5.11)
RDW: 12.7 % (ref 11.5–15.5)
WBC: 6.1 10*3/uL (ref 4.0–10.5)
nRBC: 0 % (ref 0.0–0.2)

## 2024-01-07 LAB — CMP (CANCER CENTER ONLY)
ALT: 17 U/L (ref 0–44)
AST: 19 U/L (ref 15–41)
Albumin: 4.1 g/dL (ref 3.5–5.0)
Alkaline Phosphatase: 28 U/L — ABNORMAL LOW (ref 38–126)
Anion gap: 8 (ref 5–15)
BUN: 22 mg/dL (ref 8–23)
CO2: 26 mmol/L (ref 22–32)
Calcium: 10.3 mg/dL (ref 8.9–10.3)
Chloride: 104 mmol/L (ref 98–111)
Creatinine: 1.46 mg/dL — ABNORMAL HIGH (ref 0.44–1.00)
GFR, Estimated: 36 mL/min — ABNORMAL LOW
Glucose, Bld: 94 mg/dL (ref 70–99)
Potassium: 3.9 mmol/L (ref 3.5–5.1)
Sodium: 138 mmol/L (ref 135–145)
Total Bilirubin: 0.9 mg/dL (ref 0.0–1.2)
Total Protein: 6.4 g/dL — ABNORMAL LOW (ref 6.5–8.1)

## 2024-01-07 MED ORDER — DENOSUMAB 60 MG/ML ~~LOC~~ SOSY
60.0000 mg | PREFILLED_SYRINGE | Freq: Once | SUBCUTANEOUS | Status: AC
  Administered 2024-01-07: 60 mg via SUBCUTANEOUS
  Filled 2024-01-07: qty 1

## 2024-01-07 NOTE — Progress Notes (Signed)
 [facility]  [phone] [phone]  ID: Gwendolyn King OB: July 18, 1941  MR#: [id number]  AOZ#:[id number]  Patient Care Team: Devorah Fonder, MD as PCP - Cardiology (Cardiology) Devorah Fonder, MD as Consulting Physician (Cardiology) Marquita Situ, Magali Schmitz, MD (General Surgery) Vonna Guardian Donald Frost, MD as Referring Physician (Vascular Surgery) Shellie Dials, MD as Consulting Physician (Oncology)   CHIEF COMPLAINT: History of DCIS.  Osteoporosis.  INTERVAL HISTORY: Patient returns to clinic today for routine 20-month evaluation and continuation of Prolia .  She had a fall several weeks ago and was evaluated in the emergency room, but imaging was negative.  She currently feels well and is back to her baseline.  She has no neurologic complaints.  She denies any recent fevers or illnesses.  She has a good appetite and denies weight loss. She denies any chest pain, shortness of breath, cough, or hemoptysis.  She denies any nausea, vomiting, constipation, or diarrhea.  She has no urinary complaints.  Patient offers no further specific complaints today.  REVIEW OF SYSTEMS:   Review of Systems  Constitutional: Negative.  Negative for diaphoresis, fever, malaise/fatigue and weight loss.  Respiratory: Negative.  Negative for cough and shortness of breath.   Cardiovascular: Negative.  Negative for chest pain and leg swelling.  Gastrointestinal: Negative.  Negative for abdominal pain and constipation.  Genitourinary: Negative.  Negative for dysuria.  Musculoskeletal:  Positive for back pain and joint pain.  Skin: Negative.  Negative for itching and rash.  Neurological: Negative.  Negative for sensory change, focal weakness and weakness.  Psychiatric/Behavioral: Negative.  The patient is not nervous/anxious.     As per HPI. Otherwise, a complete review of systems is negative.  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   CAD (coronary artery disease)    a. 10/2021 NSTEMI/PCI: LM 33m/d, LAD 55ost, 99/ (2.5x15 Onyx Frontier DES), D2 90, LCX 80p/m (2.5x30 Onyx Frontier DES), RCA 50ost, 60p, 99m (nl iFR throughout). LVEDP 20-66mmHg.   Cataract    Bil   Chicken pox    Chronic HFrEF (heart failure with reduced ejection fraction) (HCC)    a. 10/2021 Echo: EF 35-40%, mod asymm LVH w/ sev mid-apical ant, apical HK. GrI DD. Nl RV fxn.   Chronic kidney disease    stage 3   Colon polyp    Constipation    Hyperlipidemia    Hypothyroidism    Ischemic cardiomyopathy    a. 10/2021 Echo: EF 35-40%.   NSTEMI (non-ST elevated myocardial infarction) (HCC) 10/24/2021   Palpitations    over 20 yeras ago   Personal history of radiation therapy    Thyroid  disease    Wears hearing aid in both ears     PAST SURGICAL HISTORY: Past Surgical History:  Procedure Laterality Date   ABDOMINAL HYSTERECTOMY  1981   APPENDECTOMY  2012   BREAST BIOPSY Right 05/08/2017   Affirm Bx-DUCTAL CARCINOMA IN SITU (DCIS) WITH ONE FOCUS SUSPICIOUS FOR invasive   BREAST BIOPSY Right 05/17/2017   us  bx done at DR. Brynetts office, papillary carcinoma   BREAST BIOPSY Right 06/18/2019   Stereo bc calcs COMPATIBLE WITH PRIOR SURGICAL SITE CHANGES   BREAST LUMPECTOMY Right 06/07/2017  DUCTAL CARCINOMA IN SITU (DCIS) with microinvasion at 7:00 5 cmfn   BREAST LUMPECTOMY Right 06/07/2017   PAPILLARY LESION CONSISTENT WITH PAPILLARY CARCINOMA at retroaerolar 7:00 1.5 cmfn   BREAST LUMPECTOMY WITH SENTINEL LYMPH NODE BIOPSY Right 06/07/2017   Wide excision of 2 foci of high-grade DCIS, microinvasion noted only on core biopsy. 2 microscopic foci lateral inferior margins. Patient elected not to proceed to reexcision.   CATARACT EXTRACTION  W/PHACO Right 03/27/2022   Procedure: CATARACT EXTRACTION PHACO AND INTRAOCULAR LENS PLACEMENT (IOC) RIGHT VIVITY LENS;  Surgeon: Rosa College, MD;  Location: [facility];  Service: Ophthalmology;  Laterality: Right;  5.49 00:39.1   CATARACT EXTRACTION W/PHACO Left 04/10/2022   Procedure: CATARACT EXTRACTION PHACO AND INTRAOCULAR LENS PLACEMENT (IOC) LEFT VIVITY LENS 3.92 00:32.4;  Surgeon: Rosa College, MD;  Location: [facility];  Service: Ophthalmology;  Laterality: Left;   COLONOSCOPY     COLONOSCOPY W/ POLYPECTOMY     CORONARY PRESSURE/FFR STUDY N/A 10/25/2021   Procedure: INTRAVASCULAR PRESSURE WIRE/FFR STUDY;  Surgeon: Sammy Crisp, MD;  Location: [facility] INVASIVE CV LAB;  Service: Cardiovascular;  Laterality: N/A;   CORONARY STENT INTERVENTION N/A 10/25/2021   Procedure: CORONARY STENT INTERVENTION;  Surgeon: Sammy Crisp, MD;  Location: [facility] IN[facility] CV [facility];  Service: Cardiovascular;  Laterality: N/A;   CYSTOCELE REPAIR N/A 09/11/2019   Procedure: ANTERIOR COLPORRHAPHY;  Surgeon: Alben Alma, MD;  Location: [facility] ORS;  Service: Gynecology;  Laterality: N/A;   FINGER FRACTURE SURGERY Right    5th   HAMMER TOE SURGERY  09/20/2017   JOINT REPLACEMENT     right knee   LEFT HEART CATH AND CORONARY ANGIOGRAPHY N/A 10/25/2021   Procedure: LEFT HEART CATH AND CORONARY ANGIOGRAPHY;  Surgeon: Sammy Crisp, MD;  Location: [facility] INVASIVE CV LAB;  Service: Cardiovascular;  Laterality: N/A;   ROTATOR CUFF REPAIR Right    TONSILLECTOMY AND ADENOIDECTOMY  1963   TOTAL KNEE ARTHROPLASTY Right 10/17/2016   Procedure: TOTAL KNEE ARTHROPLASTY;  Surgeon: Dayne Even, MD;  Location: [facility] OR;  Service: Orthopedics;  Laterality: Right;   VAGINAL DELIVERY     3    FAMILY HISTORY: Family History  Problem Relation Age of Onset   Arthritis Mother    Heart disease Mother    Hypertension Mother    Arthritis Father    Heart disease Father    Hypertension Father    Heart  attack Father    Stroke Maternal Grandmother    Cancer Maternal Aunt        abdominal?   Hyperlipidemia Maternal Aunt    Diabetes Paternal Aunt    Glaucoma Brother    Hyperlipidemia Other    Diabetes Other    Breast cancer Neg Hx     ADVANCED DIRECTIVES (Y/N):  N  HEALTH MAINTENANCE: Social History   Tobacco Use   Smoking status: Never   Smokeless tobacco: Never  Vaping Use   Vaping status: Never Used  Substance Use Topics   Alcohol use: Yes    Comment: wine occ   Drug use: No     Colonoscopy:  PAP:  Bone density:  Lipid panel:  Allergies  Allergen Reactions   Singulair [Montelukast] Itching   Lipitor [Atorvastatin ] Other (See Comments)    Myalgia, arthralgia   Simvastatin Other (See Comments)    myalgia    Current Outpatient Medications  Medication Sig Dispense Refill   acetaminophen  (TYLENOL ) 500 MG tablet Take 500 mg by mouth at bedtime. Takes 1- 2  tablets at bed time.     aspirin  EC 81 MG tablet Take 81 mg by mouth at bedtime.     Biotin 5 MG TABS Take 10 mg by mouth daily.     calcium -vitamin D  (OSCAL WITH D) 500-200 MG-UNIT tablet Take 1 tablet by mouth daily.      carvedilol  (COREG ) 6.25 MG tablet Take 1 tablet (6.25 mg total) by mouth 2 (two) times daily. 180 tablet 3   cholecalciferol 25 MCG (1000 UT) tablet Take 1 tablet (1,000 Units total) by mouth every evening. 90 tablet 3   clopidogrel  (PLAVIX ) 75 MG tablet Take 1 tablet (75 mg total) by mouth daily. 90 tablet 3   denosumab  (PROLIA ) 60 MG/ML SOSY injection Inject 60 mg into the skin every 6 (six) months.     Evolocumab  (REPATHA ) 140 MG/ML SOSY Inject 140 mg into the skin every 14 (fourteen) days. 6 mL 1   levocetirizine (XYZAL ) 5 MG tablet Take 1 tablet (5 mg total) by mouth every evening. 90 tablet 3   LEVOXYL  75 MCG tablet Take 1 tablet by mouth once daily 90 tablet 2   Multiple Vitamin (MULTIVITAMIN WITH MINERALS) TABS tablet Take 1 tablet by mouth daily. Adult 50+     Omega-3 Fatty Acids  (OMEGA-3 FISH OIL PO) Take by mouth daily.     polyethylene glycol powder (GLYCOLAX /MIRALAX ) powder Take 17 g by mouth daily. With coffee     sacubitril -valsartan  (ENTRESTO ) 24-26 MG Take 1 tablet by mouth 2 (two) times daily. 180 tablet 3   spironolactone  (ALDACTONE ) 25 MG tablet Take 1 tablet (25 mg total) by mouth daily. 90 tablet 3   vitamin B-12 (CYANOCOBALAMIN ) 1000 MCG tablet Take 1,000 mcg by mouth daily.     No current facility-administered medications for this visit.   Facility-Administered Medications Ordered in Other Visits  Medication Dose Route Frequency Provider Last Rate Last Admin   denosumab  (PROLIA ) injection 60 mg  60 mg Subcutaneous Once Keenen Roessner J, MD        OBJECTIVE: Vitals:   01/07/24 1029  BP: (!) 120/50  Pulse: 75  Resp: 16  Temp: 97.7 F (36.5 C)  SpO2: 98%     Body mass index is 30.86 kg/m.    ECOG FS:0 - Asymptomatic  General: Well-developed, well-nourished, no acute distress. Eyes: Pink conjunctiva, anicteric sclera. HEENT: Normocephalic, moist mucous membranes. Lungs: No audible wheezing or coughing. Heart: Regular rate and rhythm. Abdomen: Soft, nontender, no obvious distention. Musculoskeletal: No edema, cyanosis, or clubbing. Neuro: Alert, answering all questions appropriately. Cranial nerves grossly intact. Skin: No rashes or petechiae noted. Psych: Normal affect.  LAB RESULTS:  Lab Results  Component Value Date   NA 138 01/07/2024   K 3.9 01/07/2024   CL 104 01/07/2024   CO2 26 01/07/2024   GLUCOSE 94 01/07/2024   BUN 22 01/07/2024   CREATININE 1.46 (H) 01/07/2024   CALCIUM  10.3 01/07/2024   PROT 6.4 (L) 01/07/2024   ALBUMIN 4.1 01/07/2024   AST 19 01/07/2024   ALT 17 01/07/2024   ALKPHOS 28 (L) 01/07/2024   BILITOT 0.9 01/07/2024   GFRNONAA 36 (L) 01/07/2024   GFRAA 49 (L) 06/01/2017    Lab Results  Component Value Date   WBC 6.1 01/07/2024   NEUTROABS 2.7 01/07/2024   HGB 13.8 01/07/2024   HCT 40.8  01/07/2024   MCV 88.9 01/07/2024   PLT 111 (L) 01/07/2024     STUDIES: No results found.   ASSESSMENT: History of DCIS.  Osteoporosis.  PLAN:    Osteoporosis: Patient's most recent bone mineral density on June 19, 2023 reported T-score of -2.6 which is significantly improved than 1 year prior where her T-score supported -3.1.  Patient previously was on Fosamax  with no apparent benefit and was switched to Prolia .  Calcium  levels are adequate to proceed with treatment today.  Return to clinic in 6 months with repeat laboratory, further evaluation, and continuation of Prolia .  Repeat bone mineral density in October 2025.  Continue calcium  and vitamin D  supplementation. History of DCIS, right breast:  Patient underwent lumpectomy on June 07, 2017.  Final pathology was reviewed and no invasive component was noted, therefore patient did not require chemotherapy.  She completed adjuvant XRT in January 2019.  Because of patient's persistent hot flashes, tamoxifen  was discontinued and patient was initiated on letrozole .  Patient completed 5 years of letrozole  in January 2024.  Her most recent mammogram on June 19, 2023 was reported as BI-RADS 1.  Repeat mammogram in October 2025 along with bone mineral density as above. Renal insufficiency: Creatinine has trended up slightly to 1.46.  Monitor.   Thrombocytopenia: Chronic and unchanged.  Patient's platelet count is 111 today.  Her platelet count has ranged between 90 and 150 since February 2018.    Patient expressed understanding and was in agreement with this plan. She also understands that She can call clinic at any time with any questions, concerns, or complaints.    Cancer Staging  Ductal carcinoma in situ (DCIS) of right breast Staging form: Breast, AJCC 8th Edition - Clinical stage from 08/22/2017: Stage 0 (cTis (DCIS), cN0, cM0, ER+, PR-, HER2-) - Signed by Shellie Dials, MD on 08/22/2017 Laterality: Right   Shellie Dials,  MD   01/07/2024 10:50 AM

## 2024-01-07 NOTE — Progress Notes (Signed)
 Scans for when she fell 11/23/2023, hit her eye when she fell. No new questions for the doctor today.

## 2024-01-08 ENCOUNTER — Encounter (INDEPENDENT_AMBULATORY_CARE_PROVIDER_SITE_OTHER): Payer: Self-pay

## 2024-01-17 ENCOUNTER — Encounter: Payer: Self-pay | Admitting: General Practice

## 2024-01-17 ENCOUNTER — Ambulatory Visit (INDEPENDENT_AMBULATORY_CARE_PROVIDER_SITE_OTHER)
Admission: RE | Admit: 2024-01-17 | Discharge: 2024-01-17 | Disposition: A | Discharge status: Home / Self Care | Source: Ambulatory Visit | Attending: General Practice | Admitting: General Practice

## 2024-01-17 ENCOUNTER — Ambulatory Visit: Payer: Self-pay | Admitting: General Practice

## 2024-01-17 ENCOUNTER — Ambulatory Visit: Admitting: Internal Medicine

## 2024-01-17 ENCOUNTER — Ambulatory Visit: Admitting: General Practice

## 2024-01-17 VITALS — BP 134/86 | HR 71 | Temp 98.0°F | Wt 156.0 lb

## 2024-01-17 DIAGNOSIS — R051 Acute cough: Secondary | ICD-10-CM

## 2024-01-17 DIAGNOSIS — R0602 Shortness of breath: Secondary | ICD-10-CM

## 2024-01-17 LAB — POCT INFLUENZA A/B
Influenza A, POC: NEGATIVE
Influenza B, POC: NEGATIVE

## 2024-01-17 LAB — POC COVID19 BINAXNOW: SARS Coronavirus 2 Ag: NEGATIVE

## 2024-01-17 MED ORDER — BENZONATATE 200 MG PO CAPS: 200.0000 mg | ORAL_CAPSULE | Freq: Three times a day (TID) | ORAL | 0 refills | Status: DC | PRN

## 2024-01-17 MED ORDER — DOXYCYCLINE HYCLATE 100 MG PO TABS: 100.0000 mg | ORAL_TABLET | Freq: Two times a day (BID) | ORAL | 0 refills | Status: AC

## 2024-01-17 MED ORDER — PREDNISONE 20 MG PO TABS: 40.0000 mg | ORAL_TABLET | Freq: Every day | ORAL | 0 refills | Status: AC

## 2024-01-17 NOTE — Assessment & Plan Note (Signed)
 Stable for outpatient treatment.  Chest x-ray pending.

## 2024-01-17 NOTE — Patient Instructions (Addendum)
 Complete xray(s) prior to leaving today. I will notify you of your results once received.   Start Benzonatate  capsules for cough. Take 1 capsule by mouth three times daily as needed for cough.  You can try a few things over the counter to help with your symptoms including:  Cough: Delsym or Robitussin (get the off brand, works just as well) Chest Congestion: Mucinex (plain) Nasal Congestion/Ear Pressure/Sinus Pressure: Try using Flonase  (fluticasone ) nasal spray. Instill 1 spray in each nostril twice daily. This can be purchased over the counter. Body aches, fevers, headache: Ibuprofen (not to exceed 2400 mg in 24 hours) or Acetaminophen -Tylenol  (not to exceed 3000 mg in 24 hours) Runny Nose/Throat Drainage/Sneezing/Itchy or Watery Eyes: An antihistamine such as Zyrtec, Claritin , Xyzal , Allegra  Increase your fluid intake, rest and use humidifier.   Follow up with your office if your symptoms worsen or fail to improve.  It was a pleasure meeting you!

## 2024-01-17 NOTE — Progress Notes (Signed)
 Established Patient Office Visit  Subjective   Patient ID: Gwendolyn King, female    DOB: 09-Mar-1941  Age: 83 y.o. MRN: [id number]  Chief Complaint  Patient presents with   Cough    And congestion, fatigue since Saturday. Patient has been taking OTC medication.     Cough Associated symptoms include shortness of breath and wheezing. Pertinent negatives include no chest pain, chills, ear pain, fever, headaches, heartburn or sore throat.    Gwendolyn King is a 83 year female, NO PCP, with past medical history of HTN, hypothyroidism, osteoporosis, CVA, NSTEMI, ischemic cardiomyopathy, presents today for an acute visit.   Cough: symptom onset was Saturday with cough, congestion, fatigue.  Had a fever Sunday night but did not check.  Productive cough with yellow color.  She has been blowing her nose and it has been clear.  Sore throat on Sunday but has resolved.  Endorses shortness of breath and wheezing.  Denies any chest pain, nausea, vomiting, diarrhea or urinary symptoms.   She has been taking Mucus DM from [facility].  Her husband is also sick, he got sick first. No known exposure to flu or covid.   Up to date on influenza vaccine, pneumonia vaccine and covid vaccine.  Smoked one pack in her life as a chid.  Hx of asthma as a child.   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/2020   Aortic atherosclerosis  (HCC) 11/01/2020   Thrombocytopenia, unspecified (HCC) 11/01/2020   Osteoporosis 06/15/2020   Venous insufficiency 05/03/2020   Allergic rhinitis 04/21/2019   Poison ivy 04/21/2019   Cystocele, midline 09/25/2018   S/P hysterectomy 07/31/2018   CKD (chronic kidney disease) stage 3, GFR 30-59 ml/min (HCC) 05/22/2018   Acute cough 12/12/2017   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   CAD (coronary artery disease)    a. 10/2021 NSTEMI/PCI: LM 57m/d, LAD 55ost, 99/ (2.5x15 Onyx Frontier DES), D2 90, LCX 80p/m (2.5x30 Onyx Frontier DES), RCA 50ost, 60p, 70m (nl iFR throughout). LVEDP 20-23mmHg.   Cataract    Bil   Chicken pox    Chronic HFrEF (heart failure with reduced ejection fraction) (HCC)    a. 10/2021 Echo: EF 35-40%, mod asymm LVH w/ sev mid-apical ant, apical HK. GrI DD. Nl RV fxn.   Chronic kidney disease    stage 3   Colon polyp    Constipation    Hyperlipidemia    Hypothyroidism  Ischemic cardiomyopathy    a. 10/2021 Echo: EF 35-40%.   NSTEMI (non-ST elevated myocardial infarction) (HCC) 10/24/2021   Palpitations    over 20 yeras ago   Personal history of radiation therapy    Thyroid  disease    Wears hearing aid in both ears    Past Surgical History:  Procedure Laterality Date   ABDOMINAL HYSTERECTOMY  1981   APPENDECTOMY  2012   BREAST BIOPSY Right 05/08/2017   Affirm Bx-DUCTAL CARCINOMA IN SITU (DCIS) WITH ONE FOCUS SUSPICIOUS FOR invasive   BREAST BIOPSY Right 05/17/2017   us  bx done at DR. Brynetts office,  papillary carcinoma   BREAST BIOPSY Right 06/18/2019   Stereo bc calcs COMPATIBLE WITH PRIOR SURGICAL SITE CHANGES   BREAST LUMPECTOMY Right 06/07/2017   DUCTAL CARCINOMA IN SITU (DCIS) with microinvasion at 7:00 5 cmfn   BREAST LUMPECTOMY Right 06/07/2017   PAPILLARY LESION CONSISTENT WITH PAPILLARY CARCINOMA at retroaerolar 7:00 1.5 cmfn   BREAST LUMPECTOMY WITH SENTINEL LYMPH NODE BIOPSY Right 06/07/2017   Wide excision of 2 foci of high-grade DCIS, microinvasion noted only on core biopsy. 2 microscopic foci lateral inferior margins. Patient elected not to proceed to reexcision.   CATARACT EXTRACTION W/PHACO Right 03/27/2022   Procedure: CATARACT EXTRACTION PHACO AND INTRAOCULAR LENS PLACEMENT (IOC) RIGHT VIVITY LENS;  Surgeon: Rosa College, MD;  Location: [facility];  Service: Ophthalmology;  Laterality: Right;  5.49 00:39.1   CATARACT EXTRACTION W/PHACO Left 04/10/2022   Procedure: CATARACT EXTRACTION PHACO AND INTRAOCULAR LENS PLACEMENT (IOC) LEFT VIVITY LENS 3.92 00:32.4;  Surgeon: Rosa College, MD;  Location: [facility];  Service: Ophthalmology;  Laterality: Left;   COLONOSCOPY     COLONOSCOPY W/ POLYPECTOMY     CORONARY PRESSURE/FFR STUDY N/A 10/25/2021   Procedure: INTRAVASCULAR PRESSURE WIRE/FFR STUDY;  Surgeon: Sammy Crisp, MD;  Location: [facility] IN[facility];  Service: Cardiovascular;  Laterality: N/A;   CORONARY STENT INTERVENTION N/A 10/25/2021   Procedure: CORONARY STENT INTERVENTION;  Surgeon: Sammy Crisp, MD;  Location: [facility];  Service: Cardiovascular;  Laterality: N/A;   CYSTOCELE REPAIR N/A 09/11/2019   Procedure: ANTERIOR COLPORRHAPHY;  Surgeon: Alben Alma, MD;  Location: [facility] ORS;  Service: Gynecology;  Laterality: N/A;   FINGER FRACTURE SURGERY Right    5th   HAMMER TOE SURGERY  09/20/2017   JOINT REPLACEMENT     right knee   LEFT HEART CATH AND CORONARY ANGIOGRAPHY N/A 10/25/2021   Procedure: LEFT HEART CATH AND  CORONARY ANGIOGRAPHY;  Surgeon: Sammy Crisp, MD;  Location: [facility];  Service: Cardiovascular;  Laterality: N/A;   ROTATOR CUFF REPAIR Right    TONSILLECTOMY AND ADENOIDECTOMY  1963   TOTAL KNEE ARTHROPLASTY Right 10/17/2016   Procedure: TOTAL KNEE ARTHROPLASTY;  Surgeon: Dayne Even, MD;  Location: [facility] OR;  Service: Orthopedics;  Laterality: Right;   VAGINAL DELIVERY     3   Allergies  Allergen Reactions   Singulair [Montelukast] Itching   Lipitor [Atorvastatin ] Other (See Comments)    Myalgia, arthralgia   Simvastatin Other (See Comments)    myalgia         01/17/2024   11:29 AM 09/05/2023    1:53 PM 07/13/2023    1:11 PM  Depression screen PHQ 2/9  Decreased Interest 1 0 0  Down, Depressed, Hopeless 0 0 0  PHQ - 2 Score 1 0 0  Altered sleeping 0 1 0  Tired, decreased energy 1 1 0  Change in  appetite 0 0 0  Feeling bad or failure about yourself  1 0 0  Trouble concentrating 1 0 0  Moving slowly or fidgety/restless 0 0 0  Suicidal thoughts 0 0 0  PHQ-9 Score 4 2 0  Difficult doing work/chores Not difficult at all Not difficult at all Not difficult at all       01/17/2024   11:29 AM 07/13/2023    1:11 PM 09/11/2022    2:28 PM  GAD 7 : Generalized Anxiety Score  Nervous, Anxious, on Edge 0 0 0  Control/stop worrying 0 0 0  Worry too much - different things 1 0 0  Trouble relaxing 1 0 0  Restless 0 0 0  Easily annoyed or irritable 1 0 0  Afraid - awful might happen 0 0 0  Total GAD 7 Score 3 0 0  Anxiety Difficulty Not difficult at all Not difficult at all       Review of Systems  Constitutional:  Positive for malaise/fatigue. Negative for chills and fever.  HENT:  Positive for congestion. Negative for ear pain, sinus pain and sore throat.   Eyes:  Negative for pain.  Respiratory:  Positive for cough, shortness of breath and wheezing.   Cardiovascular:  Negative for chest pain.  Gastrointestinal:  Negative for abdominal pain, constipation,  diarrhea, heartburn, nausea and vomiting.  Genitourinary:  Negative for dysuria, frequency and urgency.  Neurological:  Negative for dizziness and headaches.  Endo/Heme/Allergies:  Negative for polydipsia.  Psychiatric/Behavioral:  Negative for depression and suicidal ideas. The patient is not nervous/anxious.       Objective:     BP 134/86 (BP Location: Left Arm, Patient Position: Sitting, Cuff Size: Normal)   Pulse 71   Temp 98 F (36.7 C) (Oral)   Wt 156 lb (70.8 kg)   SpO2 97%   BMI 30.47 kg/m  BP Readings from Last 3 Encounters:  01/17/24 134/86  01/07/24 (!) 120/50  11/30/23 (!) 120/56   Wt Readings from Last 3 Encounters:  01/17/24 156 lb (70.8 kg)  01/07/24 158 lb (71.7 kg)  11/30/23 159 lb 12.8 oz (72.5 kg)      Physical Exam Vitals and nursing note reviewed.  Constitutional:      Appearance: She is not ill-appearing.  HENT:     Right Ear: Tympanic membrane, ear canal and external ear normal.     Left Ear: Tympanic membrane, ear canal and external ear normal.     Nose: Congestion present.     Mouth/Throat:     Pharynx: Oropharynx is clear.  Eyes:     Conjunctiva/sclera: Conjunctivae normal.  Cardiovascular:     Rate and Rhythm: Normal rate and regular rhythm.     Pulses: Normal pulses.     Heart sounds: Normal heart sounds.  Pulmonary:     Effort: Pulmonary effort is normal.     Breath sounds: Wheezing present.  Neurological:     Mental Status: She is alert and oriented to person, place, and time.  Psychiatric:        Mood and Affect: Mood normal.        Behavior: Behavior normal.        Thought Content: Thought content normal.        Judgment: Judgment normal.      Results for orders placed or performed in visit on 01/17/24  POC COVID-19  Result Value Ref Range   SARS Coronavirus 2 Ag Negative Negative  POCT Influenza A/B  Result  Value Ref Range   Influenza A, POC Negative Negative   Influenza B, POC Negative Negative       The ASCVD  Risk score (Arnett DK, et al., 2019) failed to calculate for the following reasons:   The 2019 ASCVD risk score is only valid for ages 69 to 69   Risk score cannot be calculated because patient has a medical history suggesting prior/existing ASCVD    Assessment & Plan:  Acute cough Assessment & Plan: POC covid and influenza test negative.  Symptoms suggestive of bronchitis.  Chest x-ray pending given shortness of breath and wheezing.  Vital signs stable.   Start Benzonatate  capsules for cough. Take 1 capsule by mouth three times daily as needed for cough. Rx sent.  Start prednisone  20 mg tablets. Take 2 tablets by mouth once daily in the morning for 5 days. Rx sent.  Consider antibiotic depending on xray results.   Orders: -     POC COVID-19 BinaxNow -     POCT Influenza A/B -     DG Chest 2 View -     Benzonatate ; Take 1 capsule (200 mg total) by mouth 3 (three) times daily as needed.  Dispense: 20 capsule; Refill: 0 -     predniSONE ; Take 2 tablets (40 mg total) by mouth daily for 5 days.  Dispense: 10 tablet; Refill: 0  Shortness of breath Assessment & Plan: Stable for outpatient treatment.  Chest x-ray pending.  Orders: -     DG Chest 2 View -     predniSONE ; Take 2 tablets (40 mg total) by mouth daily for 5 days.  Dispense: 10 tablet; Refill: 0     Return if symptoms worsen or fail to improve.    Jolanda Nation, NP

## 2024-01-17 NOTE — Assessment & Plan Note (Addendum)
 POC covid and influenza test negative.  Symptoms suggestive of bronchitis.  Chest x-ray pending given shortness of breath and wheezing.  Vital signs stable.   Start Benzonatate  capsules for cough. Take 1 capsule by mouth three times daily as needed for cough. Rx sent.  Start prednisone  20 mg tablets. Take 2 tablets by mouth once daily in the morning for 5 days. Rx sent.  Consider antibiotic depending on xray results.

## 2024-02-18 ENCOUNTER — Encounter: Payer: Self-pay | Admitting: Oncology

## 2024-02-19 ENCOUNTER — Ambulatory Visit

## 2024-02-19 VITALS — BP 108/66 | HR 56 | Temp 98.3°F | Ht 60.25 in | Wt 156.2 lb

## 2024-02-19 DIAGNOSIS — D696 Thrombocytopenia, unspecified: Secondary | ICD-10-CM | POA: Diagnosis not present

## 2024-02-19 DIAGNOSIS — T466X5A Adverse effect of antihyperlipidemic and antiarteriosclerotic drugs, initial encounter: Secondary | ICD-10-CM

## 2024-02-19 DIAGNOSIS — M810 Age-related osteoporosis without current pathological fracture: Secondary | ICD-10-CM | POA: Diagnosis not present

## 2024-02-19 DIAGNOSIS — E782 Mixed hyperlipidemia: Secondary | ICD-10-CM

## 2024-02-19 DIAGNOSIS — I639 Cerebral infarction, unspecified: Secondary | ICD-10-CM

## 2024-02-19 DIAGNOSIS — I10 Essential (primary) hypertension: Secondary | ICD-10-CM

## 2024-02-19 DIAGNOSIS — E039 Hypothyroidism, unspecified: Secondary | ICD-10-CM

## 2024-02-19 DIAGNOSIS — J301 Allergic rhinitis due to pollen: Secondary | ICD-10-CM | POA: Diagnosis not present

## 2024-02-19 DIAGNOSIS — I255 Ischemic cardiomyopathy: Secondary | ICD-10-CM

## 2024-02-19 DIAGNOSIS — N1832 Chronic kidney disease, stage 3b: Secondary | ICD-10-CM | POA: Diagnosis not present

## 2024-02-19 DIAGNOSIS — N3941 Urge incontinence: Secondary | ICD-10-CM | POA: Insufficient documentation

## 2024-02-19 DIAGNOSIS — M791 Myalgia, unspecified site: Secondary | ICD-10-CM | POA: Diagnosis not present

## 2024-02-19 DIAGNOSIS — I2510 Atherosclerotic heart disease of native coronary artery without angina pectoris: Secondary | ICD-10-CM | POA: Diagnosis not present

## 2024-02-19 DIAGNOSIS — Z9861 Coronary angioplasty status: Secondary | ICD-10-CM

## 2024-02-19 DIAGNOSIS — K635 Polyp of colon: Secondary | ICD-10-CM | POA: Insufficient documentation

## 2024-02-19 DIAGNOSIS — H1045 Other chronic allergic conjunctivitis: Secondary | ICD-10-CM | POA: Insufficient documentation

## 2024-02-19 DIAGNOSIS — J3081 Allergic rhinitis due to animal (cat) (dog) hair and dander: Secondary | ICD-10-CM | POA: Insufficient documentation

## 2024-02-19 MED ORDER — LEVOXYL 75 MCG PO TABS: ORAL_TABLET | ORAL | 2 refills | Status: DC

## 2024-02-19 MED ORDER — REPATHA 140 MG/ML ~~LOC~~ SOSY: 140.0000 mg | PREFILLED_SYRINGE | SUBCUTANEOUS | 1 refills | Status: DC

## 2024-02-19 MED ORDER — LEVOCETIRIZINE DIHYDROCHLORIDE 5 MG PO TABS: 5.0000 mg | ORAL_TABLET | Freq: Every evening | ORAL | 3 refills | Status: DC

## 2024-02-19 NOTE — Assessment & Plan Note (Signed)
 Chronic issue.  Check TSH.  Continue Levoxyl  75 mcg daily, refill sent.

## 2024-02-19 NOTE — Assessment & Plan Note (Signed)
 Patient has been unable to tolerate statins in the past (arthritis of hands).

## 2024-02-19 NOTE — Assessment & Plan Note (Signed)
 Check CMP, if GFR in 3b range will recommend referral to nephrology.  Avoid nephrotoxic medications eg- NSAIDs.

## 2024-02-19 NOTE — Assessment & Plan Note (Signed)
 Chronic, stable.  Check CBC, B12. Continue monitoring.

## 2024-02-19 NOTE — Assessment & Plan Note (Signed)
 BP stable today. Continue

## 2024-02-19 NOTE — Assessment & Plan Note (Addendum)
 Continue f/u with cardiology.  Also counseled patient on role of Spironolactone  due to ischemic cardiomyopathy. I counseled patient to continue taking Spironolactone  and discuss risks and benefits during her cardiology visit. Her serum potassium has been normal.

## 2024-02-19 NOTE — Progress Notes (Addendum)
 Established Patient Office Visit TOC from Dr. Maribeth     Subjective  Patient ID: Gwendolyn King, female    DOB: 1941/03/01  Age: 83 y.o. MRN: [id number]  Chief Complaint  Patient presents with   Establish Care    She  has a past medical history of Allergy, Arthritis, Asthma, Breast cancer (HCC) (05/08/2017), CAD (coronary artery disease), Cataract, Chicken pox, Chronic HFrEF (heart failure with reduced ejection fraction) (HCC), Chronic kidney disease, Colon polyp, Constipation, Hypercalcemia (10/24/2021), Hyperlipidemia, Hypothyroidism, Ischemic cardiomyopathy, NSTEMI (non-ST elevated myocardial infarction) (HCC) (10/24/2021), Palpitations, Personal history of radiation therapy, Syncope (07/13/2023), Thoracic back pain (11/04/2021), Thyroid  disease, and Wears hearing aid in both ears.  HPI 1) Urge incontinence: Has been ongoing for about one year. She has seen Dr. Deward Lesches in the past and had Pessary and surgery for cystocele back in 09/12/2019.  She reports this was helpful for about 3 years but noted symptoms come back. She denies fever, chills, urinary frequency, lower back pain.   2) H/O Asthma as a child. Never been hospitalized in the past for asthma exacerbation.   3) Takes multiple vitamins and supplements. Takes Biotin 10 mg daily, Calcium -vitamin D , B 12 1000 mcg daily.   4) Osteoporosis: On prolia  twice a year.   5) Hyperlipidemia:  Statin intolerance, arthritis.  On Repatha  140 mg, twice a month. Needs refill.   6) CAD, S/P coronary stent, HTN:  Established with cardiologist Dr. Gollan.  On Plavix  75 mg daily.  Carvedilol  6.25 mg twice a day, Hydrochlorothiazide 25 mg, Entresto  24-26 mg BID.  She is not taking Spironolactone  25 mg daily.   7) Hypothyroidism: On Levoxyl  75 mcg in morning.   8) H/O colon polyps: last colonoscopy 2019, 5 years repeat recommended.   9) Has h/o thrombocytopenia: No bleeding.    ROS As per HPI    Objective:     BP 108/66 (BP  Location: Left Arm, Patient Position: Sitting, Cuff Size: Normal)   Pulse (!) 56   Temp 98.3 F (36.8 C) (Oral)   Ht 5' 0.25 (1.53 m)   Wt 156 lb 3.2 oz (70.9 kg)   SpO2 97%   BMI 30.25 kg/m      02/19/2024    3:18 PM 01/17/2024   11:29 AM 09/05/2023    1:53 PM  Depression screen PHQ 2/9  Decreased Interest 0 1 0  Down, Depressed, Hopeless 0 0 0  PHQ - 2 Score 0 1 0  Altered sleeping 1 0 1  Tired, decreased energy 3 1 1   Change in appetite 0 0 0  Feeling bad or failure about yourself  0 1 0  Trouble concentrating 0 1 0  Moving slowly or fidgety/restless 0 0 0  Suicidal thoughts 0 0 0  PHQ-9 Score 4 4 2   Difficult doing work/chores Somewhat difficult Not difficult at all Not difficult at all      02/19/2024    3:18 PM 01/17/2024   11:29 AM 07/13/2023    1:11 PM 09/11/2022    2:28 PM  GAD 7 : Generalized Anxiety Score  Nervous, Anxious, on Edge 0 0 0 0  Control/stop worrying 0 0 0 0  Worry too much - different things 0 1 0 0  Trouble relaxing 0 1 0 0  Restless 0 0 0 0  Easily annoyed or irritable 0 1 0 0  Afraid - awful might happen 0 0 0 0  Total GAD 7 Score 0 3 0 0  Anxiety  Difficulty Not difficult at all Not difficult at all Not difficult at all       02/19/2024    3:18 PM 01/17/2024   11:29 AM 09/05/2023    1:53 PM  Depression screen PHQ 2/9  Decreased Interest 0 1 0  Down, Depressed, Hopeless 0 0 0  PHQ - 2 Score 0 1 0  Altered sleeping 1 0 1  Tired, decreased energy 3 1 1   Change in appetite 0 0 0  Feeling bad or failure about yourself  0 1 0  Trouble concentrating 0 1 0  Moving slowly or fidgety/restless 0 0 0  Suicidal thoughts 0 0 0  PHQ-9 Score 4 4 2   Difficult doing work/chores Somewhat difficult Not difficult at all Not difficult at all      02/19/2024    3:18 PM 01/17/2024   11:29 AM 07/13/2023    1:11 PM 09/11/2022    2:28 PM  GAD 7 : Generalized Anxiety Score  Nervous, Anxious, on Edge 0 0 0 0  Control/stop worrying 0 0 0 0  Worry too much -  different things 0 1 0 0  Trouble relaxing 0 1 0 0  Restless 0 0 0 0  Easily annoyed or irritable 0 1 0 0  Afraid - awful might happen 0 0 0 0  Total GAD 7 Score 0 3 0 0  Anxiety Difficulty Not difficult at all Not difficult at all Not difficult at all    SDOH Screenings   Food Insecurity: No Food Insecurity (09/05/2023)  Housing: Unknown (09/05/2023)  Transportation Needs: No Transportation Needs (09/05/2023)  Utilities: Not At Risk (09/05/2023)  Alcohol Screen: Low Risk  (09/05/2023)  Depression (PHQ2-9): Low Risk  (02/19/2024)  Financial Resource Strain: Low Risk  (09/05/2023)  Physical Activity: Insufficiently Active (09/05/2023)  Social Connections: Socially Integrated (09/05/2023)  Stress: No Stress Concern Present (09/05/2023)  Tobacco Use: Low Risk  (02/19/2024)  Health Literacy: Adequate Health Literacy (09/05/2023)     Physical Exam Constitutional:      General: She is not in acute distress.    Appearance: Normal appearance.  HENT:     Head: Normocephalic and atraumatic.     Right Ear: Tympanic membrane normal.     Left Ear: Tympanic membrane normal.     Mouth/Throat:     Mouth: Mucous membranes are moist.  Neck:     Thyroid : No thyroid  mass or thyroid  tenderness.   Cardiovascular:     Rate and Rhythm: Normal rate and regular rhythm.  Pulmonary:     Effort: Pulmonary effort is normal.     Breath sounds: Normal breath sounds. No wheezing.  Abdominal:     General: Bowel sounds are normal.     Palpations: Abdomen is soft.     Tenderness: There is no abdominal tenderness. There is no right CVA tenderness, left CVA tenderness, guarding or rebound.   Musculoskeletal:     Cervical back: Neck supple. No rigidity.     Right lower leg: No edema.     Left lower leg: No edema.   Skin:    General: Skin is warm.   Neurological:     Mental Status: She is alert and oriented to person, place, and time.   Psychiatric:        Mood and Affect: Mood normal.        Behavior:  Behavior normal.        No results found for any visits on 02/19/24.  The ASCVD Risk  score (Arnett DK, et al., 2019) failed to calculate for the following reasons:   The 2019 ASCVD risk score is only valid for ages 31 to 49   Risk score cannot be calculated because patient has a medical history suggesting prior/existing ASCVD     Assessment & Plan:   Urge incontinence Assessment & Plan: Check UA, urine culture to r/o UTI.  Recommend pelvic floor exercise. Printed exercise resource provided.  Recommend reestablishing care with gynecologist. Referral made.   Orders: -     Ambulatory referral to Obstetrics / Gynecology -     Urinalysis, Routine w reflex microscopic -     Urine Culture  Mixed hyperlipidemia Assessment & Plan: Check lipid panel today.  Continue Repatha  140 mg, every 14 days, refill sent.  Intolerant to statin.    Orders: -     Repatha ; Inject 140 mg into the skin every 14 (fourteen) days.  Dispense: 6 mL; Refill: 1 -     Lipid panel  Acquired hypothyroidism Assessment & Plan: Chronic issue.  Check TSH.  Continue Levoxyl  75 mcg daily, refill sent.   Orders: -     Levoxyl ; Take 1 tablet by mouth once daily  Dispense: 90 tablet; Refill: 2 -     TSH  Primary hypertension Assessment & Plan: BP stable today. Continue   Orders: -     Comprehensive metabolic panel with GFR  Thrombocytopenia (HCC) Assessment & Plan: Chronic, stable.  Check CBC, B12. Continue monitoring.   Orders: -     Vitamin B12 -     CBC with Differential/Platelet  Osteoporosis without current pathological fracture, unspecified osteoporosis type Assessment & Plan: Continue Prolia  injection twice a year. Check vitamin D  level today.   Orders: -     VITAMIN D  25 Hydroxy (Vit-D Deficiency, Fractures)  CAD S/P percutaneous coronary angioplasty -     Repatha ; Inject 140 mg into the skin every 14 (fourteen) days.  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    Ischemic cardiomyopathy Assessment & Plan: Continue f/u with cardiology.  Also counseled patient on role of Spironolactone  due to ischemic cardiomyopathy. I counseled patient to continue taking Spironolactone  and discuss risks and benefits during her cardiology visit. Her serum potassium has been normal.     Stage 3b chronic kidney disease (HCC) Assessment & Plan: Check CMP, if GFR in 3b range will recommend referral to nephrology.  Avoid nephrotoxic medications eg- NSAIDs.    Seasonal allergic rhinitis due to pollen Assessment & Plan: Stable on Xyzal  5 mg once a day, continue.   Orders: -     Levocetirizine Dihydrochloride ; Take 1 tablet (5 mg total) by mouth every evening.  Dispense: 90 tablet; Refill: 3  Polyp of descending colon, unspecified type Assessment & Plan: Last colonoscopy 02/2018.  Qualifies for colonoscopy/diagnostic, revisit during next office visit.  Discussed risks and benefits of colonoscopy today.    I spent 45  minutes on the day of this face-to-face encounter reviewing the patient's medical and surgical history, medications, ongoing concerns, and reviewing the assessment and plan with the patient. This time also included counseling the patient on their health conditions and management options. Additionally, I spent time post-visit ordering and reviewing diagnostics and therapeutics with the patient.  Return for 6 m for chronic .  Luke Shade, MD

## 2024-02-19 NOTE — Assessment & Plan Note (Signed)
 Check UA, urine culture to r/o UTI.  Recommend pelvic floor exercise. Printed exercise resource provided.  Recommend reestablishing care with gynecologist. Referral made.

## 2024-02-19 NOTE — Assessment & Plan Note (Signed)
 Stable on Xyzal  5 mg once a day, continue.

## 2024-02-19 NOTE — Assessment & Plan Note (Signed)
 Last colonoscopy 02/2018.  Qualifies for colonoscopy/diagnostic, revisit during next office visit.  Discussed risks and benefits of colonoscopy today.

## 2024-02-19 NOTE — Assessment & Plan Note (Signed)
 Continue Prolia  injection twice a year. Check vitamin D  level today.

## 2024-02-19 NOTE — Assessment & Plan Note (Signed)
 Check lipid panel today.  Continue Repatha  140 mg, every 14 days, refill sent.  Intolerant to statin.

## 2024-02-20 ENCOUNTER — Ambulatory Visit: Payer: Self-pay

## 2024-02-20 DIAGNOSIS — E782 Mixed hyperlipidemia: Secondary | ICD-10-CM

## 2024-02-20 DIAGNOSIS — N1832 Chronic kidney disease, stage 3b: Secondary | ICD-10-CM

## 2024-02-20 DIAGNOSIS — E039 Hypothyroidism, unspecified: Secondary | ICD-10-CM

## 2024-02-20 DIAGNOSIS — J301 Allergic rhinitis due to pollen: Secondary | ICD-10-CM

## 2024-02-20 DIAGNOSIS — I2510 Atherosclerotic heart disease of native coronary artery without angina pectoris: Secondary | ICD-10-CM

## 2024-02-20 LAB — URINE CULTURE
MICRO NUMBER:: 16646644
Result:: NO GROWTH
SPECIMEN QUALITY:: ADEQUATE

## 2024-02-20 LAB — COMPREHENSIVE METABOLIC PANEL WITH GFR
ALT: 14 U/L (ref 0–35)
AST: 17 U/L (ref 0–37)
Albumin: 4.4 g/dL (ref 3.5–5.2)
Alkaline Phosphatase: 28 U/L — ABNORMAL LOW (ref 39–117)
BUN: 19 mg/dL (ref 6–23)
CO2: 31 meq/L (ref 19–32)
Calcium: 10.5 mg/dL (ref 8.4–10.5)
Chloride: 105 meq/L (ref 96–112)
Creatinine, Ser: 1.22 mg/dL — ABNORMAL HIGH (ref 0.40–1.20)
GFR: 41.74 mL/min — ABNORMAL LOW
Glucose, Bld: 86 mg/dL (ref 70–99)
Potassium: 4.5 meq/L (ref 3.5–5.1)
Sodium: 142 meq/L (ref 135–145)
Total Bilirubin: 0.7 mg/dL (ref 0.2–1.2)
Total Protein: 6.3 g/dL (ref 6.0–8.3)

## 2024-02-20 LAB — CBC WITH DIFFERENTIAL/PLATELET
Basophils Absolute: 0.1 10*3/uL (ref 0.0–0.1)
Basophils Relative: 0.8 % (ref 0.0–3.0)
Eosinophils Absolute: 0.3 10*3/uL (ref 0.0–0.7)
Eosinophils Relative: 4.6 % (ref 0.0–5.0)
HCT: 41.3 % (ref 36.0–46.0)
Hemoglobin: 13.9 g/dL (ref 12.0–15.0)
Lymphocytes Relative: 35.2 % (ref 12.0–46.0)
Lymphs Abs: 2.3 10*3/uL (ref 0.7–4.0)
MCHC: 33.7 g/dL (ref 30.0–36.0)
MCV: 87.2 fl (ref 78.0–100.0)
Monocytes Absolute: 0.6 10*3/uL (ref 0.1–1.0)
Monocytes Relative: 9.6 % (ref 3.0–12.0)
Neutro Abs: 3.3 10*3/uL (ref 1.4–7.7)
Neutrophils Relative %: 49.8 % (ref 43.0–77.0)
Platelets: 140 10*3/uL — ABNORMAL LOW (ref 150.0–400.0)
RBC: 4.74 Mil/uL (ref 3.87–5.11)
RDW: 13.3 % (ref 11.5–15.5)
WBC: 6.6 10*3/uL (ref 4.0–10.5)

## 2024-02-20 LAB — LIPID PANEL
Cholesterol: 119 mg/dL (ref 0–200)
HDL: 44.6 mg/dL
LDL Cholesterol: 40 mg/dL (ref 0–99)
NonHDL: 74
Total CHOL/HDL Ratio: 3
Triglycerides: 171 mg/dL — ABNORMAL HIGH (ref 0.0–149.0)
VLDL: 34.2 mg/dL (ref 0.0–40.0)

## 2024-02-20 LAB — URINALYSIS, ROUTINE W REFLEX MICROSCOPIC
Bilirubin Urine: NEGATIVE
Ketones, ur: NEGATIVE
Leukocytes,Ua: NEGATIVE
Nitrite: NEGATIVE
Specific Gravity, Urine: 1.01 (ref 1.000–1.030)
Total Protein, Urine: NEGATIVE
Urine Glucose: NEGATIVE
Urobilinogen, UA: 0.2 (ref 0.0–1.0)
pH: 7 (ref 5.0–8.0)

## 2024-02-20 LAB — VITAMIN D 25 HYDROXY (VIT D DEFICIENCY, FRACTURES): VITD: 84.27 ng/mL (ref 30.00–100.00)

## 2024-02-20 LAB — TSH: TSH: 0.94 u[IU]/mL (ref 0.35–5.50)

## 2024-02-20 LAB — VITAMIN B12: Vitamin B-12: >1500 pg/mL — ABNORMAL HIGH (ref 211–911)

## 2024-02-21 MED ORDER — LEVOXYL 75 MCG PO TABS: ORAL_TABLET | ORAL | 2 refills | Status: DC

## 2024-02-21 MED ORDER — REPATHA 140 MG/ML ~~LOC~~ SOSY: 140.0000 mg | PREFILLED_SYRINGE | SUBCUTANEOUS | 3 refills | Status: DC

## 2024-02-21 MED ORDER — LEVOCETIRIZINE DIHYDROCHLORIDE 5 MG PO TABS: 5.0000 mg | ORAL_TABLET | Freq: Every evening | ORAL | 3 refills | Status: DC

## 2024-02-27 MED ORDER — DAPAGLIFLOZIN PROPANEDIOL 10 MG PO TABS: 10.0000 mg | ORAL_TABLET | Freq: Every day | ORAL | 3 refills | Status: AC

## 2024-04-22 ENCOUNTER — Ambulatory Visit: Admitting: Obstetrics & Gynecology

## 2024-04-22 ENCOUNTER — Encounter: Payer: Self-pay | Admitting: Obstetrics & Gynecology

## 2024-04-22 VITALS — BP 87/59 | HR 60 | Ht 60.25 in | Wt 152.8 lb

## 2024-04-22 DIAGNOSIS — Z7689 Persons encountering health services in other specified circumstances: Secondary | ICD-10-CM | POA: Diagnosis not present

## 2024-04-22 DIAGNOSIS — N3941 Urge incontinence: Secondary | ICD-10-CM

## 2024-04-22 NOTE — Progress Notes (Signed)
    GYNECOLOGY PROGRESS NOTE  Subjective:    Patient ID: Gwendolyn King, female    DOB: [birth year], 83 y.o.   MRN: [id number]  HPI  Patient is a 83 y.o. married (for 50+ years) G3P3 (and 5 grands and 3+ greats) here as a new patient with the issue of urinary leaking. This started about a year ago. It happens as she is trying to reach the toilet but can't quite make it. It is not with coughing/sneezing/lifting. She had an anterior repair in 2022 with Dr. Arloa to fix a cystocele. She was able to stop using her pessary after that surgery.   She reports that this issue has improved in the last few months.She is unsure what is different. She did see her primary care provider 02/2024 and she rec'd pelvic exercises which she did on her own at home. She does not have to wear panty liners or pads. She reports that she currently has a voiding accident very rarely, last accident was a month ago.  The following portions of the patient's history were reviewed and updated as appropriate: allergies, current medications, past family history, past medical history, past social history, past surgical history, and problem list.  Review of Systems Pertinent items are noted in HPI.  She had a hysterectomy in the distant past essentially for contraception.  Objective:   Height 5' 0.25 (1.53 m), weight 152 lb 12.8 oz (69.3 kg). Body mass index is 29.59 kg/m. Well nourished, well hydrated White female, no apparent distress She is ambulating and conversing normally. EG- atrophy (expected), hemorrhoid noted Pederson speculum used. Vagina normal Bimanual exam- reveals no masses or pain, ovaries not palpable   Assessment:   1. Establishing care with new doctor, encounter for   2. Urge incontinence      Plan:   1. Establishing care with new doctor, encounter for (Primary)   2. Urge incontinence   - Although she is improving, I will place an order for pelvic PT. I also encouraged weight loss and  exercise.

## 2024-05-05 ENCOUNTER — Other Ambulatory Visit: Payer: Self-pay

## 2024-05-05 ENCOUNTER — Ambulatory Visit: Attending: Obstetrics & Gynecology

## 2024-05-05 DIAGNOSIS — N3941 Urge incontinence: Secondary | ICD-10-CM | POA: Insufficient documentation

## 2024-05-05 DIAGNOSIS — N393 Stress incontinence (female) (male): Secondary | ICD-10-CM | POA: Insufficient documentation

## 2024-05-05 DIAGNOSIS — R2689 Other abnormalities of gait and mobility: Secondary | ICD-10-CM | POA: Diagnosis present

## 2024-05-05 DIAGNOSIS — R293 Abnormal posture: Secondary | ICD-10-CM | POA: Insufficient documentation

## 2024-05-05 DIAGNOSIS — M6281 Muscle weakness (generalized): Secondary | ICD-10-CM | POA: Insufficient documentation

## 2024-05-05 NOTE — Patient Instructions (Signed)
 TOILET POSTURE: Urination: feet flat, lean forward with forearms on legs to fully empty bladder. Bowel movement: place feet flat on Squatty Potty or stool so knees are higher than hips, lean forward to relax pelvic floor in order to avoid strain.  SHOES: wear supportive shoes, and sandals with straps.  POSTURE: try not to cross legs at knees or ankles. Try the figure four stretch instead.  WATER: start with water first thing in the morning.   PELVIC TILTS: try to stand in neutral, not tucking your "tail" and not arching back, but in the middle.

## 2024-05-05 NOTE — Therapy (Signed)
 OUTPATIENT PHYSICAL THERAPY FEMALE PELVIC EVALUATION   Patient Name: Gwendolyn King MRN: [id number] DOB:July 12, 1941, 83 y.o., female Today's Date: 05/05/2024  END OF SESSION:  PT End of Session - 05/05/24 1545     Visit Number 1    Number of Visits 9    Date for PT Re-Evaluation 07/04/24    Authorization Type [employer]    Progress Note Due on Visit 10    PT Start Time 1531    PT Stop Time 1621    PT Time Calculation (min) 50 min    Activity Tolerance Patient tolerated treatment well    Behavior During Therapy WFL for tasks assessed/performed          Past Medical History:  Diagnosis Date   Allergy    hay fever, fall allergies   Arthritis    Asthma    as a child, dad was smoker   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   CAD (coronary artery disease)    a. 10/2021 NSTEMI/PCI: LM 31m/d, LAD 55ost, 99/ (2.5x15 Onyx Frontier DES), D2 90, LCX 80p/m (2.5x30 Onyx Frontier DES), RCA 50ost, 60p, 37m (nl iFR throughout). LVEDP 20-62mmHg.   Cataract    Bil   Chicken pox    Chronic HFrEF (heart failure with reduced ejection fraction) (HCC)    a. 10/2021 Echo: EF 35-40%, mod asymm LVH w/ sev mid-apical ant, apical HK. GrI DD. Nl RV fxn.   Chronic kidney disease    stage 3   Colon polyp    Constipation    Hypercalcemia 10/24/2021   Hyperlipidemia    Hypothyroidism    Ischemic cardiomyopathy    a. 10/2021 Echo: EF 35-40%.   NSTEMI (non-ST elevated myocardial infarction) (HCC) 10/24/2021   Palpitations    over 20 yeras ago   Personal history of radiation therapy    Syncope 07/13/2023   Thoracic back pain 11/04/2021   Thyroid  disease    Wears hearing aid in both ears    Past Surgical History:  Procedure Laterality Date   ABDOMINAL HYSTERECTOMY  1981   APPENDECTOMY  2012   BREAST BIOPSY Right 05/08/2017   Affirm Bx-DUCTAL CARCINOMA IN SITU (DCIS) WITH ONE FOCUS SUSPICIOUS FOR  invasive   BREAST BIOPSY Right 05/17/2017   us  bx done at DR. 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 2 microscopic foci lateral inferior margins. Patient elected not to proceed to reexcision.   CATARACT EXTRACTION W/PHACO Right 03/27/2022   Procedure: CATARACT EXTRACTION PHACO AND INTRAOCULAR LENS PLACEMENT (IOC) RIGHT VIVITY LENS;  Surgeon: Myrna Adine Anes, MD;  Location: [facility];  Service: Ophthalmology;  Laterality: Right;  5.49 00:39.1   CATARACT EXTRACTION W/PHACO Left 04/10/2022   Procedure: CATARACT EXTRACTION PHACO AND INTRAOCULAR LENS PLACEMENT (IOC) LEFT VIVITY LENS 3.92 00:32.4;  Surgeon: Myrna Adine Anes, MD;  Location: [facility];  Service: Ophthalmology;  Laterality: Left;   COLONOSCOPY     COLONOSCOPY W/ POLYPECTOMY  CORONARY PRESSURE/FFR STUDY N/A 10/25/2021   Procedure: INTRAVASCULAR PRESSURE WIRE/FFR STUDY;  Surgeon: Mady Bruckner, MD;  Location: [facility];  Service: Cardiovascular;  Laterality: N/A;   CORONARY STENT INTERVENTION N/A 10/25/2021   Procedure: CORONARY STENT INTERVENTION;  Surgeon: Mady Bruckner, MD;  Location: [facility];  Service: Cardiovascular;  Laterality: N/A;   CYSTOCELE REPAIR N/A 09/11/2019   Procedure: ANTERIOR COLPORRHAPHY;  Surgeon: Arloa Lamar SQUIBB, MD;  Location: [facility];  Service: Gynecology;  Laterality: N/A;   FINGER FRACTURE SURGERY Right    5th   HAMMER TOE SURGERY  09/20/2017   JOINT REPLACEMENT     right knee   LEFT  HEART CATH AND CORONARY ANGIOGRAPHY N/A 10/25/2021   Procedure: LEFT HEART CATH AND CORONARY ANGIOGRAPHY;  Surgeon: Mady Bruckner, MD;  Location: [facility];  Service: Cardiovascular;  Laterality: N/A;   ROTATOR CUFF REPAIR Right    TONSILLECTOMY AND ADENOIDECTOMY  1963   TOTAL KNEE ARTHROPLASTY Right 10/17/2016   Procedure: TOTAL KNEE ARTHROPLASTY;  Surgeon: Maude Herald, MD;  Location: [facility];  Service: Orthopedics;  Laterality: Right;   VAGINAL DELIVERY     3   Patient Active Problem List   Diagnosis Date Noted   Allergic rhinitis due to animal (cat) (dog) hair and dander 02/19/2024   Allergic rhinitis due to pollen 02/19/2024   Urge incontinence 02/19/2024   CAD S/P percutaneous coronary angioplasty 02/19/2024   Polyp of descending colon 02/19/2024   Carotid stenosis 07/04/2022   Right shoulder pain 03/08/2022   Sleeping difficulty 03/08/2022   Ischemic cardiomyopathy    Chronic low back pain 05/04/2021   Myalgia due to HMG CoA reductase inhibitor 11/01/2020   Thrombocytopenia (HCC) 11/01/2020   Osteoporosis 06/15/2020   Venous insufficiency 05/03/2020   S/P hysterectomy 07/31/2018   CKD (chronic kidney disease) stage 3, GFR 30-59 ml/min (HCC) 05/22/2018   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 Luke Shade at [place]   REFERRING PROVIDER: Harland Birkenhead MD  REFERRING DIAG: SUI  THERAPY DIAG:  SUI (stress urinary incontinence, female) - Plan: PT plan of care cert/re-cert  Muscle weakness (generalized) - Plan: PT plan of care cert/re-cert  Other abnormalities of gait and mobility - Plan: PT plan of care cert/re-cert  Abnormal posture - Plan: PT plan of care cert/re-cert  Rationale for Evaluation and Treatment: Rehabilitation  ONSET DATE: 04/22/24 referral date but SUI started about a year  ago  SUBJECTIVE:  SUBJECTIVE STATEMENT: URINARY FUNCTION: SUI began about a year ago when she had a cold, she also has urgency and it continued. Pt reported she voids every few hours. Pt denied pain with voiding. Pt states stream of urine is strong most of the time. Pt gets up approx. 1x/night, but sometimes she'll get up 4-5x/night. Pt experiences leakage (usually a dribble but sometimes its enough to change underwear or pants), approx. 1-2/week. Does not wear pads or pantyliners. Pt had bladder surgery for prolapse.  BOWEL FUNCTION: a couple times a day. Pt has hemorrhoids and occasionally has pain with BM. 75% of the time a type 3 or 4. She does take something similar to Metamucil and it helps.  CORE STABILITY: hx of hysterectomy (vaginally), hx of bladder surgery for prolapse, and appendectomy. Pt has fallen on her tailbone a few times and was told she has scoliosis. Most recent fall was 11/2023 when she tripped over a box. Pt reported chronic LBP, at worst: 5-6/10 and massager tool helps, at best: 0/10 when seated.  SEXUAL FUNCTION: pt has not had intercourse in several years as pt's husband has dementia and ED. Pt denied pain with past experiences during intercourse, tampon insertion or OBGYN exams. Pt had difficulty climaxing.   Fluid intake: a cup of coffee, water with flavoring (Mio), sweet tea (overall water: at least two, 20 oz. Cup), occ wine or soda  FUNCTIONAL LIMITATIONS: no  PERTINENT HISTORY:  Medications for current condition: none Surgeries: see above Other: HTN, venous insufficiency, carotid stenosis, CAD s/p percutaneous coronary angioplasty, , hypothyroidism, osteoporosis, CKD stage 3, polyps in colon, HLD, chronic LBP, ductal carcinoma in situ R breast s/p treatment (lumpectomy), urge  incontinence, hysterectomy, NSTEMI, asthma, cataract B extraction, CVA in 2023, R TKA, R hammer toe, R shoulder surgery Sexual abuse: No   PAIN:  Are you having pain? No NPRS scale: 0/10 Back pain: 5-6/10 but eases with sitting and massage.  PRECAUTIONS: None  RED FLAGS: None   WEIGHT BEARING RESTRICTIONS: No  FALLS:  Has patient fallen in last 6 months? No  OCCUPATION: retired  ACTIVITY LEVEL : foot cycle, elliptical bike (hasn't used it in awhile), volunteers at church  PLOF: Independent  PATIENT GOALS: To be able to go to the bathroom and not leak, and to strengthen muscles overall.    BOWEL MOVEMENT: Pain with bowel movement: Yes occ 2/2 hemorrhoids Type of bowel movement:Type (Bristol Stool Scale) types 3 and 4 with fiber supplement Fully empty rectum: Yes:   Leakage: No                                                  Pads: No Fiber supplement/laxative Yes   URINATION: Pain with urination: No Fully empty bladder: Yes:                                  Post-void dribble: No Stream: Strong typically Urgency: Yes  Frequency:during the day every few hours.  Nocturia: Yes: 1-5x/night   Leakage: Walking to the bathroom, Coughing, Sneezing, Laughing, and Lifting Pads/briefs: No but has to change underwear at times  INTERCOURSE:  Ability to have vaginal penetration has not been sexually active in a few years 2/2 husband's health declining but no hx of pain. Climax: difficulty Marinoff Scale: 0/3 Lubricant:  PREGNANCY: Number of pregnancies:  Vaginal deliveries 2 Tearing Yes:   Episiotomy No C-section deliveries 0 Currently pregnant No  PROLAPSE: None had cystocele repaired    OBJECTIVE:  Note: Objective measures were completed at Evaluation unless otherwise noted.   COGNITION: Overall cognitive status: Within functional limits for tasks assessed     SENSATION: Light touch: Appears  intact   FUNCTIONAL TESTS:   Single leg stance:  Rt: LOB and had to hold wall  Lt: LOB and had to hold wall  GAIT: Assistive device utilized: None Comments: decr. Stride length, decr. Trunk rotation, post. Pelvic tilt  POSTURE: forward head, decreased lumbar lordosis, increased thoracic kyphosis, decreased thoracic kyphosis, and posterior pelvic tilt T1-3 incr. Kyphosis, T4-12 decr. kyphosis   LUMBARAROM/PROM:   A/PROM A/PROM  Eval (% available)  Flexion WFL  Extension Limited approx. 25%  Right lateral flexion WFL with pain  Left lateral flexion WFL  Right rotation Limited by 50%  Left rotation Limited by 25%   (Blank rows = not tested)  LOWER EXTREMITY ROM:  Active ROM Right eval Left eval  Hip flexion    Hip extension    Hip abduction    Hip adduction    Hip internal rotation    Hip external rotation    Knee flexion    Knee extension    Ankle dorsiflexion    Ankle plantarflexion    Ankle inversion    Ankle eversion     (Blank rows = not tested)  LOWER EXTREMITY MMT:  MMT Right eval Left eval  Hip flexion    Hip extension    Hip abduction    Hip adduction    Hip internal rotation    Hip external rotation    Knee flexion    Knee extension    Ankle dorsiflexion    Ankle plantarflexion    Ankle inversion    Ankle eversion     (Blank rows = not tested) PALPATION:  General: no TTP of spine in standing   PELVIC MMT:   MMT eval  Vaginal   Internal Anal Sphincter   External Anal Sphincter   Puborectalis   Diastasis Recti   (Blank rows = not tested)        TONE: limited by time constraints   PROLAPSE: limited by time constraints   TODAY'S TREATMENT:                                                                                                                              DATE: 05/05/24  EVAL   SELF CARE:  PATIENT EDUCATION:  Education details: PT educated  pt on main functions of the pelvic floor, IAP, breath and PFM relationship.  PT discussed POC, frequency and duration. PT provided the following education: TOILET POSTURE: Urination: feet flat, lean forward with forearms on legs to fully empty bladder. Bowel movement: place feet flat on Squatty Potty or stool so knees are higher than hips, lean forward to relax pelvic floor in order to avoid strain.  SHOES: wear supportive shoes, and sandals with straps.  POSTURE: try not to cross legs at knees or ankles. Try the figure four stretch instead.  WATER: start with water first thing in the morning.   PELVIC TILTS: try to stand in neutral, not tucking your tail and not arching back, but in the middle.  Person educated: Patient Education method: Explanation, Demonstration, and Handouts Education comprehension: verbalized understanding, returned demonstration, and needs further education  HOME EXERCISE PROGRAM: Not yet established  ASSESSMENT:  CLINICAL IMPRESSION: Patient is a pleasant 83 y.o. female who was seen today for physical therapy evaluation and treatment for SUI and chronic LBP.  Pt's PMH is significant for the following: HTN, venous insufficiency, carotid stenosis, CAD s/p percutaneous coronary angioplasty, , hypothyroidism, osteoporosis, CKD stage 3, polyps in colon, HLD, chronic LBP, ductal carcinoma in situ R breast s/p treatment (lumpectomy), urge incontinence, hysterectomy, NSTEMI, asthma, cataract B extraction, CVA in 2023, R TKA, R hammer toe, R shoulder surgery. The following impairments were noted upon exam: limited ROM, back pain, postural dysfunction, decr. Strength likely 2/2 subjective reports and gait deviations, impaired balance with hx of falls, nocturia, SUI. Pt would benefit from skilled PT to improve safety and decr. Pain during all ADLs.   OBJECTIVE IMPAIRMENTS: Abnormal gait, decreased balance, decreased coordination, decreased endurance, difficulty walking, decreased ROM, decreased strength, hypomobility, increased fascial restrictions,  impaired flexibility, postural dysfunction, and pain.   ACTIVITY LIMITATIONS: carrying, lifting, bending, standing, squatting, sleeping, transfers, continence, dressing, locomotion level, and caring for others  PARTICIPATION LIMITATIONS: meal prep, cleaning, laundry, interpersonal relationship, shopping, community activity, and church  PERSONAL FACTORS: Age, Past/current experiences, and 3+ comorbidities: see above. are also affecting patient's functional outcome.   REHAB POTENTIAL: Good  CLINICAL DECISION MAKING: Stable/uncomplicated  EVALUATION COMPLEXITY: Low   GOALS: Goals reviewed with patient? Yes  SHORT TERM GOALS: Target date: for all STGs: 06/02/24  Pt will be IND in HEP to improve pain, strength, coordination. Baseline: no HEP Goal status: INITIAL  2.  Pt will demo proper toileting posture to fully empty bladder and reduce straining during bowel movement. Baseline:  unable to demo Goal status: INITIAL  3.   Pt will improve hip and core strength to decr. Back pain to </=4/10 at worst when standing for >10 minutes. Baseline: 5-6/10 at worst Goal status: INITIAL  4.  Pt will demonstrated improved relaxation and contraction of PFM with coordination of breath to reduce urinary leakage to </=once/week. Baseline: 1-2/week Goal status: INITIAL  5.  Finish exam and write goals as indicated. Baseline: limited by time constraints Goal status: INITIAL   LONG TERM GOALS: Target date: for all LTGs: 06/30/24  Pt will demonstrated improved relaxation and contraction of PFM with coordination of breath to reduce urinary leakage to zero/week. Baseline: 1-2/week Goal status: INITIAL  2.  Pt will report walking and using elliptical and strength training or gym classes to maximize gains made in PT. Baseline: no HEP Goal status: INITIAL  3.   Pt will improve hip and core strength to decr. Back pain to </=2/10 at worst when standing for >10  minutes. Baseline:  Goal status:  INITIAL  4.   Pt will incr. Water intake to >/=64 oz. Per day and incr. Fiber intake to >/=25g to reduce constipation. Baseline: about 20 oz./day and metamucil Goal status: INITIAL   PLAN: finish exam (palpation, ROM, MMT, DR) Establish HEP.   PT FREQUENCY: 1x/week  PT DURATION: 8 weeks  PLANNED INTERVENTIONS: 97164- PT Re-evaluation, 97110-Therapeutic exercises, 97530- Therapeutic activity, 97112- Neuromuscular re-education, 97535- Self Care, [id number]- Manual therapy, [phone]- Gait training, [phone] (1-2 muscles), 20561 (3+ muscles)- Dry Needling, Patient/Family education, Balance training, Taping, Joint mobilization, Spinal mobilization, Moist heat, and Biofeedback mobilization with caution 2/2 osteoporosis     Gerik Coberly L, PT 05/05/2024, 4:35 PM  Delon Pinal, PT,DPT 05/05/24 4:35 PM Phone: [phone] Fax: [phone]

## 2024-05-12 ENCOUNTER — Other Ambulatory Visit: Payer: Self-pay | Admitting: Oncology

## 2024-05-12 DIAGNOSIS — Z1231 Encounter for screening mammogram for malignant neoplasm of breast: Secondary | ICD-10-CM

## 2024-05-13 ENCOUNTER — Other Ambulatory Visit: Payer: Self-pay

## 2024-05-13 ENCOUNTER — Ambulatory Visit

## 2024-05-13 DIAGNOSIS — M6281 Muscle weakness (generalized): Secondary | ICD-10-CM

## 2024-05-13 DIAGNOSIS — N393 Stress incontinence (female) (male): Secondary | ICD-10-CM

## 2024-05-13 DIAGNOSIS — R293 Abnormal posture: Secondary | ICD-10-CM

## 2024-05-13 DIAGNOSIS — R2689 Other abnormalities of gait and mobility: Secondary | ICD-10-CM

## 2024-05-13 NOTE — Therapy (Signed)
 OUTPATIENT PHYSICAL THERAPY FEMALE PELVIC TREATMENT   Patient Name: Gwendolyn King MRN: [id number] DOB:1940/12/08, 83 y.o., female Today's Date: 05/13/2024  END OF SESSION:  PT End of Session - 05/13/24 1406     Visit Number 2    Number of Visits 9    Date for Recertification  07/04/24    Authorization Type [employer]    Progress Note Due on Visit 10    PT Start Time 1402    PT Stop Time 1445    PT Time Calculation (min) 43 min    Activity Tolerance Patient tolerated treatment well    Behavior During Therapy WFL for tasks assessed/performed          Past Medical History:  Diagnosis Date   Allergy    hay fever, fall allergies   Arthritis    Asthma    as a child, dad was smoker   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   CAD (coronary artery disease)    a. 10/2021 NSTEMI/PCI: LM 6m/d, LAD 55ost, 99/ (2.5x15 Onyx Frontier DES), D2 90, LCX 80p/m (2.5x30 Onyx Frontier DES), RCA 50ost, 60p, 17m (nl iFR throughout). LVEDP 20-83mmHg.   Cataract    Bil   Chicken pox    Chronic HFrEF (heart failure with reduced ejection fraction) (HCC)    a. 10/2021 Echo: EF 35-40%, mod asymm LVH w/ sev mid-apical ant, apical HK. GrI DD. Nl RV fxn.   Chronic kidney disease    stage 3   Colon polyp    Constipation    Hypercalcemia 10/24/2021   Hyperlipidemia    Hypothyroidism    Ischemic cardiomyopathy    a. 10/2021 Echo: EF 35-40%.   NSTEMI (non-ST elevated myocardial infarction) (HCC) 10/24/2021   Palpitations    over 20 yeras ago   Personal history of radiation therapy    Syncope 07/13/2023   Thoracic back pain 11/04/2021   Thyroid  disease    Wears hearing aid in both ears    Past Surgical History:  Procedure Laterality Date   ABDOMINAL HYSTERECTOMY  1981   APPENDECTOMY  2012   BREAST BIOPSY Right 05/08/2017   Affirm Bx-DUCTAL CARCINOMA IN SITU (DCIS) WITH ONE FOCUS SUSPICIOUS FOR  invasive   BREAST BIOPSY Right 05/17/2017   us  bx done at DR. 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 2 microscopic foci lateral inferior margins. Patient elected not to proceed to reexcision.   CATARACT EXTRACTION W/PHACO Right 03/27/2022   Procedure: CATARACT EXTRACTION PHACO AND INTRAOCULAR LENS PLACEMENT (IOC) RIGHT VIVITY LENS;  Surgeon: Myrna Adine Anes, MD;  Location: [facility];  Service: Ophthalmology;  Laterality: Right;  5.49 00:39.1   CATARACT EXTRACTION W/PHACO Left 04/10/2022   Procedure: CATARACT EXTRACTION PHACO AND INTRAOCULAR LENS PLACEMENT (IOC) LEFT VIVITY LENS 3.92 00:32.4;  Surgeon: Myrna Adine Anes, MD;  Location: [facility];  Service: Ophthalmology;  Laterality: Left;   COLONOSCOPY     COLONOSCOPY W/ POLYPECTOMY  CORONARY PRESSURE/FFR STUDY N/A 10/25/2021   Procedure: INTRAVASCULAR PRESSURE WIRE/FFR STUDY;  Surgeon: Mady Bruckner, MD;  Location: [facility] INVASIVE CV [facility];  Service: Cardiovascular;  Laterality: N/A;   CORONARY STENT INTERVENTION N/A 10/25/2021   Procedure: CORONARY STENT INTERVENTION;  Surgeon: Mady Bruckner, MD;  Location: [facility] IN[facility] CV [facility];  Service: Cardiovascular;  Laterality: N/A;   CYSTOCELE REPAIR N/A 09/11/2019   Procedure: ANTERIOR COLPORRHAPHY;  Surgeon: Arloa Lamar SQUIBB, MD;  Location: [facility] ORS;  Service: Gynecology;  Laterality: N/A;   FINGER FRACTURE SURGERY Right    5th   HAMMER TOE SURGERY  09/20/2017   JOINT REPLACEMENT     right knee   LEFT  HEART CATH AND CORONARY ANGIOGRAPHY N/A 10/25/2021   Procedure: LEFT HEART CATH AND CORONARY ANGIOGRAPHY;  Surgeon: Mady Bruckner, MD;  Location: [facility] IN[facility];  Service: Cardiovascular;  Laterality: N/A;   ROTATOR CUFF REPAIR Right    TONSILLECTOMY AND ADENOIDECTOMY  1963   TOTAL KNEE ARTHROPLASTY Right 10/17/2016   Procedure: TOTAL KNEE ARTHROPLASTY;  Surgeon: Maude Herald, MD;  Location: [facility] OR;  Service: Orthopedics;  Laterality: Right;   VAGINAL DELIVERY     3   Patient Active Problem List   Diagnosis Date Noted   Allergic rhinitis due to animal (cat) (dog) hair and dander 02/19/2024   Allergic rhinitis due to pollen 02/19/2024   Urge incontinence 02/19/2024   CAD S/P percutaneous coronary angioplasty 02/19/2024   Polyp of descending colon 02/19/2024   Carotid stenosis 07/04/2022   Right shoulder pain 03/08/2022   Sleeping difficulty 03/08/2022   Ischemic cardiomyopathy    Chronic low back pain 05/04/2021   Myalgia due to HMG CoA reductase inhibitor 11/01/2020   Thrombocytopenia 11/01/2020   Osteoporosis 06/15/2020   Venous insufficiency 05/03/2020   S/P hysterectomy 07/31/2018   CKD (chronic kidney disease) stage 3, GFR 30-59 ml/min (HCC) 05/22/2018   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 Luke Shade at [place]   REFERRING PROVIDER: Harland Birkenhead MD  REFERRING DIAG: SUI  THERAPY DIAG:  SUI (stress urinary incontinence, female)  Muscle weakness (generalized)  Other abnormalities of gait and mobility  Abnormal posture  Rationale for Evaluation and Treatment: Rehabilitation  ONSET DATE: 04/22/24 referral date but SUI started about a year ago  SUBJECTIVE:  SUBJECTIVE STATEMENT: Pt reported leakage occurred twice this last week, as she waited too long to go to the bathroom. She been performing toileting posture and having water in the morning and it has helped.   EVAL: URINARY FUNCTION: SUI began about a year ago when she had a cold, she also has urgency and it continued. Pt reported she voids every few hours. Pt denied pain with voiding. Pt states stream of urine is strong most of the time. Pt gets up approx. 1x/night, but sometimes she'll get up 4-5x/night. Pt experiences leakage (usually a dribble but sometimes its enough to change underwear or pants), approx. 1-2/week. Does not wear pads or pantyliners. Pt had bladder surgery for prolapse.  BOWEL FUNCTION: a couple times a day. Pt has hemorrhoids and occasionally has pain with BM. 75% of the time a type 3 or 4. She does take something similar to Metamucil and it helps.  CORE STABILITY: hx of hysterectomy (vaginally), hx of bladder surgery for prolapse, and appendectomy. Pt has fallen on her tailbone a few times and was told she has scoliosis. Most recent fall was 11/2023 when she tripped over a box. Pt reported chronic LBP, at worst: 5-6/10 and massager tool helps, at best: 0/10 when seated.  SEXUAL FUNCTION: pt has not had intercourse in several years as pt's husband has dementia and ED. Pt denied pain with past experiences during intercourse, tampon insertion or OBGYN exams. Pt had difficulty climaxing.   Fluid intake: a cup of coffee, water with flavoring (Mio), sweet tea (overall water: at least two, 20 oz. Cup), occ wine or soda  FUNCTIONAL LIMITATIONS: no  PERTINENT HISTORY:  Medications for current condition: none Surgeries: see above Other: HTN, venous insufficiency, carotid stenosis, CAD s/p percutaneous coronary angioplasty, , hypothyroidism, osteoporosis, CKD stage 3, polyps in colon, HLD, chronic LBP, ductal carcinoma in situ R breast s/p  treatment (lumpectomy), urge incontinence, hysterectomy, NSTEMI, asthma, cataract B extraction, CVA in 2023, R TKA, R hammer toe, R shoulder surgery Sexual abuse: No   PAIN:  Are you having pain? No 05/13/24 NPRS scale: 0/10 Back pain: 5-6/10 but eases with sitting and massage.  PRECAUTIONS: None  RED FLAGS: None   WEIGHT BEARING RESTRICTIONS: No  FALLS:  Has patient fallen in last 6 months? No  OCCUPATION: retired  ACTIVITY LEVEL : foot cycle, elliptical bike (hasn't used it in awhile), volunteers at church  PLOF: Independent  PATIENT GOALS: To be able to go to the bathroom and not leak, and to strengthen muscles overall.    BOWEL MOVEMENT: Pain with bowel movement: Yes occ 2/2 hemorrhoids Type of bowel movement:Type (Bristol Stool Scale) types 3 and 4 with fiber supplement Fully empty rectum: Yes:   Leakage: No                                                  Pads: No Fiber supplement/laxative Yes   URINATION: Pain with urination: No Fully empty bladder: Yes:                                  Post-void dribble: No Stream: Strong typically Urgency: Yes  Frequency:during the day every few hours.  Nocturia: Yes: 1-5x/night   Leakage: Walking to the bathroom, Coughing, Sneezing, Laughing, and Lifting Pads/briefs: No but has to change underwear at times  INTERCOURSE:  Ability to have vaginal penetration has not been sexually active in a few years 2/2 husband's health declining but no hx of pain. Climax: difficulty Marinoff Scale: 0/3 Lubricant:  PREGNANCY: Number of pregnancies:  Vaginal deliveries 2 Tearing Yes:   Episiotomy No C-section deliveries 0 Currently pregnant No  PROLAPSE: None had cystocele repaired    OBJECTIVE:  Note: Objective measures were completed at Evaluation unless otherwise noted.   COGNITION: Overall cognitive status: Within functional limits for tasks  assessed     SENSATION: Light touch: Appears intact   FUNCTIONAL TESTS:   Single leg stance:  Rt: LOB and had to hold wall  Lt: LOB and had to hold wall  GAIT: Assistive device utilized: None Comments: decr. Stride length, decr. Trunk rotation, post. Pelvic tilt  POSTURE: forward head, decreased lumbar lordosis, increased thoracic kyphosis, decreased thoracic kyphosis, and posterior pelvic tilt T1-3 incr. Kyphosis, T4-12 decr. kyphosis   LUMBARAROM/PROM:   A/PROM A/PROM  Eval (% available)  Flexion WFL  Extension Limited approx. 25%  Right lateral flexion WFL with pain  Left lateral flexion WFL  Right rotation Limited by 50%  Left rotation Limited by 25%   (Blank rows = not tested)  LOWER EXTREMITY ROM: all WFL except for limited B hip IR  Active ROM Right eval Left eval  Hip flexion    Hip extension    Hip abduction    Hip adduction    Hip internal rotation    Hip external rotation    Knee flexion    Knee extension    Ankle dorsiflexion    Ankle plantarflexion    Ankle inversion    Ankle eversion     (Blank rows = not tested)  LOWER EXTREMITY MMT:  MMT Right eval Left eval  Hip flexion 4- 4-  Hip extension    Hip abduction 2 2  Hip adduction 2 2  Hip internal rotation 4- 4-  Hip external rotation 4- 4-  Knee flexion 4- 4-  Knee extension 5 5  Ankle dorsiflexion 4+ 4+  Ankle plantarflexion    Ankle inversion    Ankle eversion     (Blank rows = not tested) PALPATION:  General: no TTP of spine in standing 9/23: TTP over lower lumbar and sacrum, which pt reports is chronic, L convex tx spine curve (scoliosis hx), no TTP over hips/glutes or LEs. Limited B hip IR. Hx of osteoporosis so did not perform PAMs but tx flexion noted with FHP. No DR noted, incr. Infrasternal rib angle (L side)   PELVIC MMT:   MMT eval  Vaginal   Internal Anal Sphincter   External Anal Sphincter   Puborectalis   Diastasis Recti   (Blank rows = not tested)         TONE:   PROLAPSE:   TODAY'S TREATMENT:  DATE: 05/13/24   Physical function test: PT completed exam (palpation, MMT, DR, ROM). See above for details.  NMR: Access Code: [email or web address] URL: [email or web address] Date: 05/13/2024 Prepared by: Delon Pinal  Exercises - Supine Angels  - 1 x daily - 7 x weekly - 1 sets - 10 reps performed seated too) - Sidelying Open Book  - 1 x daily - 7 x weekly - 1 sets - 10 reps (performed standing) - Seated Diaphragmatic Breathing  - 1 x daily - 7 x weekly - 1 sets - 5-10 reps - Seated Transversus Abdominis Bracing  - 1 x daily - 7 x weekly - 1 sets - 10 reps with and without pillow squeezes. Cues and demo for proper technique. S for safety. No pain reported.    SELF CARE:  PATIENT EDUCATION:  Education details: PT educated pt on exam findings and initiated HEP. Reiterated the importance of IAP and PFM relationship and leakage. Person educated: Patient Education method: Explanation, Demonstration, and Handouts Education comprehension: verbalized understanding, returned demonstration, and needs further education  HOME EXERCISE PROGRAM: [phone]  ASSESSMENT:  CLINICAL IMPRESSION: Skilled session focused on completing exam and establishing HEP while being cautious of osteoporosis history. Pt required mod cues to perform HEP and will need guidance next session to ensure proper technique.  The following impairments were noted upon exam: limited ROM, back pain, postural dysfunction, decr. Strength, impaired balance with hx of falls, nocturia, SUI. Pt would benefit from skilled PT to improve safety and decr. Pain during all ADLs.   OBJECTIVE IMPAIRMENTS: Abnormal gait, decreased balance, decreased coordination, decreased endurance, difficulty walking, decreased ROM, decreased strength, hypomobility, increased  fascial restrictions, impaired flexibility, postural dysfunction, and pain.   ACTIVITY LIMITATIONS: carrying, lifting, bending, standing, squatting, sleeping, transfers, continence, dressing, locomotion level, and caring for others  PARTICIPATION LIMITATIONS: meal prep, cleaning, laundry, interpersonal relationship, shopping, community activity, and church  PERSONAL FACTORS: Age, Past/current experiences, and 3+ comorbidities: see above. are also affecting patient's functional outcome.   REHAB POTENTIAL: Good  CLINICAL DECISION MAKING: Stable/uncomplicated  EVALUATION COMPLEXITY: Low   GOALS: Goals reviewed with patient? Yes  SHORT TERM GOALS: Target date: for all STGs: 06/02/24  Pt will be IND in HEP to improve pain, strength, coordination. Baseline: no HEP Goal status: INITIAL  2.  Pt will demo proper toileting posture to fully empty bladder and reduce straining during bowel movement. Baseline:  unable to demo Goal status: INITIAL  3.   Pt will improve hip and core strength to decr. Back pain to </=4/10 at worst when standing for >10 minutes. Baseline: 5-6/10 at worst Goal status: INITIAL  4.  Pt will demonstrated improved relaxation and contraction of PFM with coordination of breath to reduce urinary leakage to </=once/week. Baseline: 1-2/week Goal status: INITIAL  5.  Finish exam and write goals as indicated. Baseline: limited by time constraints Goal status: MET   LONG TERM GOALS: Target date: for all LTGs: 06/30/24  Pt will demonstrated improved relaxation and contraction of PFM with coordination of breath to reduce urinary leakage to zero/week. Baseline: 1-2/week Goal status: INITIAL  2.  Pt will report walking and using elliptical and strength training or gym classes to maximize gains made in PT. Baseline: no HEP Goal status: INITIAL  3.   Pt will improve hip and core strength to decr. Back pain to </=2/10 at worst when standing for >10 minutes. Baseline:   Goal status: INITIAL  4.   Pt will incr. Water intake to >/=64 oz. Per  day and incr. Fiber intake to >/=25g to reduce constipation. Baseline: about 20 oz./day and metamucil Goal status: INITIAL   PLAN:Review HEP prn, internal assessment prn, manual therapy, strengthening .    PT FREQUENCY: 1x/week  PT DURATION: 8 weeks  PLANNED INTERVENTIONS: [id number]- PT Re-evaluation, 97110-Therapeutic exercises, 97530- Therapeutic activity, 97112- Neuromuscular re-education, 97535- Self Care, [id number]- Manual therapy, [phone]- Gait training, [phone] (1-2 muscles), [id number] (3+ muscles)- Dry Needling, Patient/Family education, Balance training, Taping, Joint mobilization, Spinal mobilization, Moist heat, and Biofeedback mobilization with caution 2/2 osteoporosis     Lennon Richins L, PT 05/13/2024, 2:52 PM  Delon Pinal, PT,DPT 05/13/24 2:52 PM Phone: [phone] Fax: [phone]

## 2024-05-19 ENCOUNTER — Other Ambulatory Visit: Payer: Self-pay

## 2024-05-19 ENCOUNTER — Ambulatory Visit

## 2024-05-19 DIAGNOSIS — N393 Stress incontinence (female) (male): Secondary | ICD-10-CM | POA: Diagnosis not present

## 2024-05-19 DIAGNOSIS — R2689 Other abnormalities of gait and mobility: Secondary | ICD-10-CM

## 2024-05-19 DIAGNOSIS — R293 Abnormal posture: Secondary | ICD-10-CM

## 2024-05-19 DIAGNOSIS — M6281 Muscle weakness (generalized): Secondary | ICD-10-CM

## 2024-05-19 NOTE — Therapy (Signed)
 OUTPATIENT PHYSICAL THERAPY FEMALE PELVIC TREATMENT   Patient Name: Gwendolyn King MRN: [id number] DOB:11/22/1940, 83 y.o., female Today's Date: 05/19/2024  END OF SESSION:  PT End of Session - 05/19/24 1318     Visit Number 3    Number of Visits 9    Date for Recertification  07/04/24    Authorization Type [employer]    Progress Note Due on Visit 10    PT Start Time 1316    PT Stop Time 1357    PT Time Calculation (min) 41 min    Activity Tolerance Patient tolerated treatment well    Behavior During Therapy WFL for tasks assessed/performed          Past Medical History:  Diagnosis Date   Allergy    hay fever, fall allergies   Arthritis    Asthma    as a child, dad was smoker   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   CAD (coronary artery disease)    a. 10/2021 NSTEMI/PCI: LM 75m/d, LAD 55ost, 99/ (2.5x15 Onyx Frontier DES), D2 90, LCX 80p/m (2.5x30 Onyx Frontier DES), RCA 50ost, 60p, 53m (nl iFR throughout). LVEDP 20-20mmHg.   Cataract    Bil   Chicken pox    Chronic HFrEF (heart failure with reduced ejection fraction) (HCC)    a. 10/2021 Echo: EF 35-40%, mod asymm LVH w/ sev mid-apical ant, apical HK. GrI DD. Nl RV fxn.   Chronic kidney disease    stage 3   Colon polyp    Constipation    Hypercalcemia 10/24/2021   Hyperlipidemia    Hypothyroidism    Ischemic cardiomyopathy    a. 10/2021 Echo: EF 35-40%.   NSTEMI (non-ST elevated myocardial infarction) (HCC) 10/24/2021   Palpitations    over 20 yeras ago   Personal history of radiation therapy    Syncope 07/13/2023   Thoracic back pain 11/04/2021   Thyroid  disease    Wears hearing aid in both ears    Past Surgical History:  Procedure Laterality Date   ABDOMINAL HYSTERECTOMY  1981   APPENDECTOMY  2012   BREAST BIOPSY Right 05/08/2017   Affirm Bx-DUCTAL CARCINOMA IN SITU (DCIS) WITH ONE FOCUS SUSPICIOUS FOR  invasive   BREAST BIOPSY Right 05/17/2017   us  bx done at DR. 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 2 microscopic foci lateral inferior margins. Patient elected not to proceed to reexcision.   CATARACT EXTRACTION W/PHACO Right 03/27/2022   Procedure: CATARACT EXTRACTION PHACO AND INTRAOCULAR LENS PLACEMENT (IOC) RIGHT VIVITY LENS;  Surgeon: Myrna Adine Anes, MD;  Location: [facility];  Service: Ophthalmology;  Laterality: Right;  5.49 00:39.1   CATARACT EXTRACTION W/PHACO Left 04/10/2022   Procedure: CATARACT EXTRACTION PHACO AND INTRAOCULAR LENS PLACEMENT (IOC) LEFT VIVITY LENS 3.92 00:32.4;  Surgeon: Myrna Adine Anes, MD;  Location: [facility];  Service: Ophthalmology;  Laterality: Left;   COLONOSCOPY     COLONOSCOPY W/ POLYPECTOMY  CORONARY PRESSURE/FFR STUDY N/A 10/25/2021   Procedure: INTRAVASCULAR PRESSURE WIRE/FFR STUDY;  Surgeon: Mady Bruckner, MD;  Location: [facility];  Service: Cardiovascular;  Laterality: N/A;   CORONARY STENT INTERVENTION N/A 10/25/2021   Procedure: CORONARY STENT INTERVENTION;  Surgeon: Mady Bruckner, MD;  Location: [facility];  Service: Cardiovascular;  Laterality: N/A;   CYSTOCELE REPAIR N/A 09/11/2019   Procedure: ANTERIOR COLPORRHAPHY;  Surgeon: Arloa Lamar SQUIBB, MD;  Location: [facility];  Service: Gynecology;  Laterality: N/A;   FINGER FRACTURE SURGERY Right    5th   HAMMER TOE SURGERY  09/20/2017   JOINT REPLACEMENT     right knee   LEFT  HEART CATH AND CORONARY ANGIOGRAPHY N/A 10/25/2021   Procedure: LEFT HEART CATH AND CORONARY ANGIOGRAPHY;  Surgeon: Mady Bruckner, MD;  Location: [facility];  Service: Cardiovascular;  Laterality: N/A;   ROTATOR CUFF REPAIR Right    TONSILLECTOMY AND ADENOIDECTOMY  1963   TOTAL KNEE ARTHROPLASTY Right 10/17/2016   Procedure: TOTAL KNEE ARTHROPLASTY;  Surgeon: Maude Herald, MD;  Location: [facility];  Service: Orthopedics;  Laterality: Right;   VAGINAL DELIVERY     3   Patient Active Problem List   Diagnosis Date Noted   Allergic rhinitis due to animal (cat) (dog) hair and dander 02/19/2024   Allergic rhinitis due to pollen 02/19/2024   Urge incontinence 02/19/2024   CAD S/P percutaneous coronary angioplasty 02/19/2024   Polyp of descending colon 02/19/2024   Carotid stenosis 07/04/2022   Right shoulder pain 03/08/2022   Sleeping difficulty 03/08/2022   Ischemic cardiomyopathy    Chronic low back pain 05/04/2021   Myalgia due to HMG CoA reductase inhibitor 11/01/2020   Thrombocytopenia 11/01/2020   Osteoporosis 06/15/2020   Venous insufficiency 05/03/2020   S/P hysterectomy 07/31/2018   CKD (chronic kidney disease) stage 3, GFR 30-59 ml/min (HCC) 05/22/2018   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 Luke Shade at [facility]   REFERRING PROVIDER: Harland Birkenhead MD  REFERRING DIAG: SUI  THERAPY DIAG:  SUI (stress urinary incontinence, female)  Muscle weakness (generalized)  Other abnormalities of gait and mobility  Abnormal posture  Rationale for Evaluation and Treatment: Rehabilitation  ONSET DATE: 04/22/24 referral date but SUI started about a year ago  SUBJECTIVE:  SUBJECTIVE STATEMENT: Pt reported she hasn't performed HEP over last week, only the weekend. She was visiting with her granddaughter from [place]  and taking care of her husband.    EVAL: URINARY FUNCTION: SUI began about a year ago when she had a cold, she also has urgency and it continued. Pt reported she voids every few hours. Pt denied pain with voiding. Pt states stream of urine is strong most of the time. Pt gets up approx. 1x/night, but sometimes she'll get up 4-5x/night. Pt experiences leakage (usually a dribble but sometimes its enough to change underwear or pants), approx. 1-2/week. Does not wear pads or pantyliners. Pt had bladder surgery for prolapse.  BOWEL FUNCTION: a couple times a day. Pt has hemorrhoids and occasionally has pain with BM. 75% of the time a type 3 or 4. She does take something similar to Metamucil and it helps.  CORE STABILITY: hx of hysterectomy (vaginally), hx of bladder surgery for prolapse, and appendectomy. Pt has fallen on her tailbone a few times and was told she has scoliosis. Most recent fall was 11/2023 when she tripped over a box. Pt reported chronic LBP, at worst: 5-6/10 and massager tool helps, at best: 0/10 when seated.  SEXUAL FUNCTION: pt has not had intercourse in several years as pt's husband has dementia and ED. Pt denied pain with past experiences during intercourse, tampon insertion or OBGYN exams. Pt had difficulty climaxing.   Fluid intake: a cup of coffee, water with flavoring (Mio), sweet tea (overall water: at least two, 20 oz. Cup), occ wine or soda  FUNCTIONAL LIMITATIONS: no  PERTINENT HISTORY:  Medications for current condition: none Surgeries: see above Other: HTN, venous insufficiency, carotid stenosis, CAD s/p percutaneous coronary angioplasty, , hypothyroidism, osteoporosis, CKD stage 3, polyps in colon, HLD, chronic LBP, ductal carcinoma in situ R breast s/p treatment (lumpectomy), urge  incontinence, hysterectomy, NSTEMI, asthma, cataract B extraction, CVA in 2023, R TKA, R hammer toe, R shoulder surgery Sexual abuse: No   PAIN:  Are you having pain? No 05/19/24 but reported her back was a little tender when she woke up.  NPRS scale: 0/10 Back pain: 5-6/10 but eases with sitting and massage.  PRECAUTIONS: None  RED FLAGS: None   WEIGHT BEARING RESTRICTIONS: No  FALLS:  Has patient fallen in last 6 months? No  OCCUPATION: retired  ACTIVITY LEVEL : foot cycle, elliptical bike (hasn't used it in awhile), volunteers at church  PLOF: Independent  PATIENT GOALS: To be able to go to the bathroom and not leak, and to strengthen muscles overall.    BOWEL MOVEMENT: Pain with bowel movement: Yes occ 2/2 hemorrhoids Type of bowel movement:Type (Bristol Stool Scale) types 3 and 4 with fiber supplement Fully empty rectum: Yes:   Leakage: No                                                  Pads: No Fiber supplement/laxative Yes   URINATION: Pain with urination: No Fully empty bladder: Yes:                                  Post-void dribble: No Stream: Strong typically Urgency: Yes  Frequency:during the day every few hours.  Nocturia: Yes: 1-5x/night   Leakage: Walking to the bathroom, Coughing, Sneezing, Laughing, and Lifting Pads/briefs: No but has to change underwear at times  INTERCOURSE:  Ability to have vaginal penetration has not been sexually active in a few years 2/2 husband's health declining but no hx of pain. Climax: difficulty Marinoff Scale: 0/3 Lubricant:  PREGNANCY: Number of pregnancies:  Vaginal deliveries 2 Tearing Yes:   Episiotomy No C-section deliveries 0 Currently pregnant No  PROLAPSE: None had cystocele repaired    OBJECTIVE:  Note: Objective measures were completed at Evaluation unless otherwise noted.   COGNITION: Overall cognitive status: Within functional limits for  tasks assessed     SENSATION: Light touch: Appears intact   FUNCTIONAL TESTS:   Single leg stance:  Rt: LOB and had to hold wall  Lt: LOB and had to hold wall  GAIT: Assistive device utilized: None Comments: decr. Stride length, decr. Trunk rotation, post. Pelvic tilt  POSTURE: forward head, decreased lumbar lordosis, increased thoracic kyphosis, decreased thoracic kyphosis, and posterior pelvic tilt T1-3 incr. Kyphosis, T4-12 decr. kyphosis   LUMBARAROM/PROM:   A/PROM A/PROM  Eval (% available)  Flexion WFL  Extension Limited approx. 25%  Right lateral flexion WFL with pain  Left lateral flexion WFL  Right rotation Limited by 50%  Left rotation Limited by 25%   (Blank rows = not tested)  LOWER EXTREMITY ROM: all WFL except for limited B hip IR  Active ROM Right eval Left eval  Hip flexion    Hip extension    Hip abduction    Hip adduction    Hip internal rotation    Hip external rotation    Knee flexion    Knee extension    Ankle dorsiflexion    Ankle plantarflexion    Ankle inversion    Ankle eversion     (Blank rows = not tested)  LOWER EXTREMITY MMT:  MMT Right eval Left eval  Hip flexion 4- 4-  Hip extension    Hip abduction 2 2  Hip adduction 2 2  Hip internal rotation 4- 4-  Hip external rotation 4- 4-  Knee flexion 4- 4-  Knee extension 5 5  Ankle dorsiflexion 4+ 4+  Ankle plantarflexion    Ankle inversion    Ankle eversion     (Blank rows = not tested) PALPATION:  General: no TTP of spine in standing 9/23: TTP over lower lumbar and sacrum, which pt reports is chronic, L convex tx spine curve (scoliosis hx), no TTP over hips/glutes or LEs. Limited B hip IR. Hx of osteoporosis so did not perform PAMs but tx flexion noted with FHP. No DR noted, incr. Infrasternal rib angle (L side)   PELVIC MMT:   MMT eval  Vaginal   Internal Anal Sphincter   External Anal Sphincter   Puborectalis   Diastasis Recti   (Blank rows = not tested)         TONE:   PROLAPSE:   TODAY'S TREATMENT:  DATE: 05/19/24    NMR: Access Code: [email or web address] URL: [email or web address] Date: 05/19/2024 Prepared by: Delon Pinal  Exercises - Supine Angels  - 1 x daily - 7 x weekly - 1 sets - 10 reps - Sidelying Open Book  - 1 x daily - 7 x weekly - 1 sets - 10 reps - Seated Transversus Abdominis Bracing  - 1 x daily - 7 x weekly - 1 sets - 10 reps - Clamshell with Resistance  - 1 x daily - 3 x weekly - 3 sets - 10 reps - Supine Bridge with Mini Swiss Ball Between Knees  - 1 x daily - 7 x weekly - 3 sets - 10 reps - 5 Minute Guided Meditation  - 1 x daily - 7 x weekly - 1 sets - 1 reps - Supine Diaphragmatic Breathing  - 1 x daily - 7 x weekly - 1 sets - 5 reps (hold) - Sidelying Diaphragmatic Breathing  - 1 x daily - 7 x weekly - 1 sets - 5 reps Cues and demo for proper technique. S for safety. No pain reported.    SELF CARE:  PATIENT EDUCATION:  Education details: PT educated pt on benefits of mindfulness technique (5 minute Peloton meditation with heat pack over hips and lower back), such as breath work and meditation to reduce tension and pain which can impact the PFM. PT reviewed and added to HEP to improve mobility and strength. Person educated: Patient Education method: Explanation, Demonstration, and Handouts Education comprehension: verbalized understanding, returned demonstration, and needs further education  HOME EXERCISE PROGRAM: [phone]  ASSESSMENT:  CLINICAL IMPRESSION: Skilled session focused on down-regulating CNS with mindfulness technique, which can improve tension overall and in PFM. An inter-regional approach is needed as the PFM are impacted by overall posture and GRF during gait. Pt required cues to improve technique with breath coordination but no incr. In pain reported. The  following impairments were noted upon exam: limited ROM, back pain, postural dysfunction, decr. Strength, impaired balance with hx of falls, nocturia, SUI. Pt would benefit from skilled PT to improve safety and decr. Pain during all ADLs.   OBJECTIVE IMPAIRMENTS: Abnormal gait, decreased balance, decreased coordination, decreased endurance, difficulty walking, decreased ROM, decreased strength, hypomobility, increased fascial restrictions, impaired flexibility, postural dysfunction, and pain.   ACTIVITY LIMITATIONS: carrying, lifting, bending, standing, squatting, sleeping, transfers, continence, dressing, locomotion level, and caring for others  PARTICIPATION LIMITATIONS: meal prep, cleaning, laundry, interpersonal relationship, shopping, community activity, and church  PERSONAL FACTORS: Age, Past/current experiences, and 3+ comorbidities: see above. are also affecting patient's functional outcome.   REHAB POTENTIAL: Good  CLINICAL DECISION MAKING: Stable/uncomplicated  EVALUATION COMPLEXITY: Low   GOALS: Goals reviewed with patient? Yes  SHORT TERM GOALS: Target date: for all STGs: 06/02/24  Pt will be IND in HEP to improve pain, strength, coordination. Baseline: no HEP Goal status: INITIAL  2.  Pt will demo proper toileting posture to fully empty bladder and reduce straining during bowel movement. Baseline:  unable to demo Goal status: INITIAL  3.   Pt will improve hip and core strength to decr. Back pain to </=4/10 at worst when standing for >10 minutes. Baseline: 5-6/10 at worst Goal status: INITIAL  4.  Pt will demonstrated improved relaxation and contraction of PFM with coordination of breath to reduce urinary leakage to </=once/week. Baseline: 1-2/week Goal status: INITIAL  5.  Finish exam and write goals as indicated. Baseline: limited by time constraints Goal status: MET   LONG TERM  GOALS: Target date: for all LTGs: 06/30/24  Pt will demonstrated improved  relaxation and contraction of PFM with coordination of breath to reduce urinary leakage to zero/week. Baseline: 1-2/week Goal status: INITIAL  2.  Pt will report walking and using elliptical and strength training or gym classes to maximize gains made in PT. Baseline: no HEP Goal status: INITIAL  3.   Pt will improve hip and core strength to decr. Back pain to </=2/10 at worst when standing for >10 minutes. Baseline:  Goal status: INITIAL  4.   Pt will incr. Water intake to >/=64 oz. Per day and incr. Fiber intake to >/=25g to reduce constipation. Baseline: about 20 oz./day and metamucil Goal status: INITIAL   PLAN: progress HEP prn, internal assessment prn, manual therapy, strengthening .    PT FREQUENCY: 1x/week  PT DURATION: 8 weeks  PLANNED INTERVENTIONS: 97164- PT Re-evaluation, 97110-Therapeutic exercises, 97530- Therapeutic activity, 97112- Neuromuscular re-education, 97535- Self Care, [id number]- Manual therapy, [phone]- Gait training, [phone] (1-2 muscles), 20561 (3+ muscles)- Dry Needling, Patient/Family education, Balance training, Taping, Joint mobilization, Spinal mobilization, Moist heat, and Biofeedback mobilization with caution 2/2 osteoporosis     Kapil Petropoulos L, PT 05/19/2024, 2:02 PM  Delon Pinal, PT,DPT 05/19/24 2:02 PM Phone: [phone] Fax: [phone]

## 2024-05-29 ENCOUNTER — Ambulatory Visit

## 2024-06-02 ENCOUNTER — Other Ambulatory Visit: Payer: Self-pay

## 2024-06-02 ENCOUNTER — Ambulatory Visit: Attending: Obstetrics & Gynecology

## 2024-06-02 DIAGNOSIS — R293 Abnormal posture: Secondary | ICD-10-CM | POA: Diagnosis present

## 2024-06-02 DIAGNOSIS — N393 Stress incontinence (female) (male): Secondary | ICD-10-CM | POA: Insufficient documentation

## 2024-06-02 DIAGNOSIS — M6281 Muscle weakness (generalized): Secondary | ICD-10-CM | POA: Insufficient documentation

## 2024-06-02 DIAGNOSIS — R2689 Other abnormalities of gait and mobility: Secondary | ICD-10-CM | POA: Diagnosis present

## 2024-06-02 NOTE — Therapy (Signed)
 OUTPATIENT PHYSICAL THERAPY FEMALE PELVIC TREATMENT   Patient Name: Gwendolyn King MRN: [id number] DOB:January 29, 1941, 83 y.o., female Today's Date: 06/02/2024  END OF SESSION:  PT End of Session - 06/02/24 1319     Visit Number 4    Number of Visits 9    Date for Recertification  07/04/24    Authorization Type [employer]    Progress Note Due on Visit 10    PT Start Time 1316    PT Stop Time 1355    PT Time Calculation (min) 39 min    Activity Tolerance Patient tolerated treatment well    Behavior During Therapy WFL for tasks assessed/performed          Past Medical History:  Diagnosis Date   Allergy    hay fever, fall allergies   Arthritis    Asthma    as a child, dad was smoker   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   CAD (coronary artery disease)    a. 10/2021 NSTEMI/PCI: LM 21m/d, LAD 55ost, 99/ (2.5x15 Onyx Frontier DES), D2 90, LCX 80p/m (2.5x30 Onyx Frontier DES), RCA 50ost, 60p, 36m (nl iFR throughout). LVEDP 20-46mmHg.   Cataract    Bil   Chicken pox    Chronic HFrEF (heart failure with reduced ejection fraction) (HCC)    a. 10/2021 Echo: EF 35-40%, mod asymm LVH w/ sev mid-apical ant, apical HK. GrI DD. Nl RV fxn.   Chronic kidney disease    stage 3   Colon polyp    Constipation    Hypercalcemia 10/24/2021   Hyperlipidemia    Hypothyroidism    Ischemic cardiomyopathy    a. 10/2021 Echo: EF 35-40%.   NSTEMI (non-ST elevated myocardial infarction) (HCC) 10/24/2021   Palpitations    over 20 yeras ago   Personal history of radiation therapy    Syncope 07/13/2023   Thoracic back pain 11/04/2021   Thyroid  disease    Wears hearing aid in both ears    Past Surgical History:  Procedure Laterality Date   ABDOMINAL HYSTERECTOMY  1981   APPENDECTOMY  2012   BREAST BIOPSY Right 05/08/2017   Affirm Bx-DUCTAL CARCINOMA IN SITU (DCIS) WITH ONE FOCUS SUSPICIOUS FOR  invasive   BREAST BIOPSY Right 05/17/2017   us  bx done at DR. 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 2 microscopic foci lateral inferior margins. Patient elected not to proceed to reexcision.   CATARACT EXTRACTION W/PHACO Right 03/27/2022   Procedure: CATARACT EXTRACTION PHACO AND INTRAOCULAR LENS PLACEMENT (IOC) RIGHT VIVITY LENS;  Surgeon: Myrna Adine Anes, MD;  Location: [facility];  Service: Ophthalmology;  Laterality: Right;  5.49 00:39.1   CATARACT EXTRACTION W/PHACO Left 04/10/2022   Procedure: CATARACT EXTRACTION PHACO AND INTRAOCULAR LENS PLACEMENT (IOC) LEFT VIVITY LENS 3.92 00:32.4;  Surgeon: Myrna Adine Anes, MD;  Location: [facility];  Service: Ophthalmology;  Laterality: Left;   COLONOSCOPY     COLONOSCOPY W/ POLYPECTOMY  CORONARY PRESSURE/FFR STUDY N/A 10/25/2021   Procedure: INTRAVASCULAR PRESSURE WIRE/FFR STUDY;  Surgeon: Mady Bruckner, MD;  Location: [facility];  Service: Cardiovascular;  Laterality: N/A;   CORONARY STENT INTERVENTION N/A 10/25/2021   Procedure: CORONARY STENT INTERVENTION;  Surgeon: Mady Bruckner, MD;  Location: [facility];  Service: Cardiovascular;  Laterality: N/A;   CYSTOCELE REPAIR N/A 09/11/2019   Procedure: ANTERIOR COLPORRHAPHY;  Surgeon: Arloa Lamar SQUIBB, MD;  Location: [facility] OR[facility];  Service: Gynecology;  Laterality: N/A;   FINGER FRACTURE SURGERY Right    5th   HAMMER TOE SURGERY  09/20/2017   JOINT REPLACEMENT     right knee   LEFT  HEART CATH AND CORONARY ANGIOGRAPHY N/A 10/25/2021   Procedure: LEFT HEART CATH AND CORONARY ANGIOGRAPHY;  Surgeon: Mady Bruckner, MD;  Location: [facility];  Service: Cardiovascular;  Laterality: N/A;   ROTATOR CUFF REPAIR Right    TONSILLECTOMY AND ADENOIDECTOMY  1963   TOTAL KNEE ARTHROPLASTY Right 10/17/2016   Procedure: TOTAL KNEE ARTHROPLASTY;  Surgeon: Maude Herald, MD;  Location: [facility] OR;  Service: Orthopedics;  Laterality: Right;   VAGINAL DELIVERY     3   Patient Active Problem List   Diagnosis Date Noted   Allergic rhinitis due to animal (cat) (dog) hair and dander 02/19/2024   Allergic rhinitis due to pollen 02/19/2024   Urge incontinence 02/19/2024   CAD S/P percutaneous coronary angioplasty 02/19/2024   Polyp of descending colon 02/19/2024   Carotid stenosis 07/04/2022   Right shoulder pain 03/08/2022   Sleeping difficulty 03/08/2022   Ischemic cardiomyopathy    Chronic low back pain 05/04/2021   Myalgia due to HMG CoA reductase inhibitor 11/01/2020   Thrombocytopenia 11/01/2020   Osteoporosis 06/15/2020   Venous insufficiency 05/03/2020   S/P hysterectomy 07/31/2018   CKD (chronic kidney disease) stage 3, GFR 30-59 ml/min (HCC) 05/22/2018   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 Luke Shade at [place]   REFERRING PROVIDER: Harland Birkenhead MD  REFERRING DIAG: SUI  THERAPY DIAG:  SUI (stress urinary incontinence, female)  Muscle weakness (generalized)  Other abnormalities of gait and mobility  Abnormal posture  Rationale for Evaluation and Treatment: Rehabilitation  ONSET DATE: 04/22/24 referral date but SUI started about a year ago  SUBJECTIVE:  SUBJECTIVE STATEMENT: Pt reported she is watching her grand daughter's dog. Pt reported her husband has dementia and his memory appears to be getting worse. She was asking question about walkers for her husband, and PT encouraged pt to ask pt's husband's physical therapist to determine what is best.     EVAL: URINARY FUNCTION: SUI began about a year ago when she had a cold, she also has urgency and it continued. Pt reported she voids every few hours. Pt denied pain with voiding. Pt states stream of urine is strong most of the time. Pt gets up approx. 1x/night, but sometimes she'll get up 4-5x/night. Pt experiences leakage (usually a dribble but sometimes its enough to change underwear or pants), approx. 1-2/week. Does not wear pads or pantyliners. Pt had bladder surgery for prolapse.  BOWEL FUNCTION: a couple times a day. Pt has hemorrhoids and occasionally has pain with BM. 75% of the time a type 3 or 4. She does take something similar to Metamucil and it helps.  CORE STABILITY: hx of hysterectomy (vaginally), hx of bladder surgery for prolapse, and appendectomy. Pt has fallen on her tailbone a few times and was told she has scoliosis. Most recent fall was 11/2023 when she tripped over a box. Pt reported chronic LBP, at worst: 5-6/10 and massager tool helps, at best: 0/10 when seated.  SEXUAL FUNCTION: pt has not had intercourse in several years as pt's husband has dementia and ED. Pt denied pain with past experiences during intercourse, tampon insertion or OBGYN exams. Pt had difficulty climaxing.   Fluid intake: a cup of coffee, water with flavoring (Mio), sweet tea (overall water: at least two, 20 oz. Cup), occ wine or soda  FUNCTIONAL LIMITATIONS: no  PERTINENT HISTORY:  Medications for current condition: none Surgeries: see above Other: HTN, venous insufficiency, carotid stenosis, CAD s/p percutaneous coronary angioplasty, , hypothyroidism,  osteoporosis, CKD stage 3, polyps in colon, HLD, chronic LBP, ductal carcinoma in situ R breast s/p treatment (lumpectomy), urge incontinence, hysterectomy, NSTEMI, asthma, cataract B extraction, CVA in 2023, R TKA, R hammer toe, R shoulder surgery Sexual abuse: No   PAIN:  Are you having pain? No 06/02/24  NPRS scale: 0/10 Back pain: 5-6/10 but eases with sitting and massage.  PRECAUTIONS: None  RED FLAGS: None   WEIGHT BEARING RESTRICTIONS: No  FALLS:  Has patient fallen in last 6 months? No  OCCUPATION: retired  ACTIVITY LEVEL : foot cycle, elliptical bike (hasn't used it in awhile), volunteers at church  PLOF: Independent  PATIENT GOALS: To be able to go to the bathroom and not leak, and to strengthen muscles overall.    BOWEL MOVEMENT: Pain with bowel movement: Yes occ 2/2 hemorrhoids Type of bowel movement:Type (Bristol Stool Scale) types 3 and 4 with fiber supplement Fully empty rectum: Yes:   Leakage: No                                                  Pads: No Fiber supplement/laxative Yes   URINATION: Pain with urination: No Fully empty bladder: Yes:                                  Post-void dribble: No Stream: Strong typically Urgency: Yes  Frequency:during the day every few hours.  Nocturia: Yes: 1-5x/night   Leakage: Walking to the bathroom, Coughing, Sneezing, Laughing, and Lifting Pads/briefs: No but has to change underwear at times  INTERCOURSE:  Ability to have vaginal penetration has not been sexually active in a few years 2/2 husband's health declining but no hx of pain. Climax: difficulty Marinoff Scale: 0/3 Lubricant:  PREGNANCY: Number of pregnancies:  Vaginal deliveries 2 Tearing Yes:   Episiotomy No C-section deliveries 0 Currently pregnant No  PROLAPSE: None had cystocele repaired    OBJECTIVE:  Note: Objective measures were completed at Evaluation unless otherwise  noted.   COGNITION: Overall cognitive status: Within functional limits for tasks assessed     SENSATION: Light touch: Appears intact   FUNCTIONAL TESTS:   Single leg stance:  Rt: LOB and had to hold wall  Lt: LOB and had to hold wall  GAIT: Assistive device utilized: None Comments: decr. Stride length, decr. Trunk rotation, post. Pelvic tilt  POSTURE: forward head, decreased lumbar lordosis, increased thoracic kyphosis, decreased thoracic kyphosis, and posterior pelvic tilt T1-3 incr. Kyphosis, T4-12 decr. kyphosis   LUMBARAROM/PROM:   A/PROM A/PROM  Eval (% available)  Flexion WFL  Extension Limited approx. 25%  Right lateral flexion WFL with pain  Left lateral flexion WFL  Right rotation Limited by 50%  Left rotation Limited by 25%   (Blank rows = not tested)  LOWER EXTREMITY ROM: all WFL except for limited B hip IR  Active ROM Right eval Left eval  Hip flexion    Hip extension    Hip abduction    Hip adduction    Hip internal rotation    Hip external rotation    Knee flexion    Knee extension    Ankle dorsiflexion    Ankle plantarflexion    Ankle inversion    Ankle eversion     (Blank rows = not tested)  LOWER EXTREMITY MMT:  MMT Right eval Left eval  Hip flexion 4- 4-  Hip extension    Hip abduction 2 2  Hip adduction 2 2  Hip internal rotation 4- 4-  Hip external rotation 4- 4-  Knee flexion 4- 4-  Knee extension 5 5  Ankle dorsiflexion 4+ 4+  Ankle plantarflexion    Ankle inversion    Ankle eversion     (Blank rows = not tested) PALPATION:  General: no TTP of spine in standing 9/23: TTP over lower lumbar and sacrum, which pt reports is chronic, L convex tx spine curve (scoliosis hx), no TTP over hips/glutes or LEs. Limited B hip IR. Hx of osteoporosis so did not perform PAMs but tx flexion noted with FHP. No DR noted, incr. Infrasternal rib angle (L side)   PELVIC MMT:   MMT eval  Vaginal   Internal Anal Sphincter   External  Anal Sphincter   Puborectalis   Diastasis Recti   (Blank rows = not tested)        TONE:   PROLAPSE:   TODAY'S TREATMENT:  DATE: 06/02/24    NMR: Access Code: [email or web address] URL: [email or web address] Date: 06/02/2024 Prepared by: Delon Pinal  Exercises - Supine Angels  - 1 x daily - 7 x weekly - 1 sets - 10 reps - Sidelying Open Book  - 1 x daily - 7 x weekly - 1 sets - 10 reps - Seated Transversus Abdominis Bracing  - 1 x daily - 7 x weekly - 1 sets - 10 reps - Clamshell with Resistance  - 1 x daily - 3 x weekly - 3 sets - 10 reps - Supine Bridge with Mini Swiss Ball Between Knees  - 1 x daily - 3 x weekly - 3 sets - 10 reps - 5 Minute Guided Meditation  - 1 x daily - 7 x weekly - 1 sets - 1 reps - Supine Diaphragmatic Breathing  - 1 x daily - 7 x weekly - 1 sets - 5 reps - Sidelying Diaphragmatic Breathing  - 1 x daily - 7 x weekly - 1 sets - 5 reps - Hooklying Small March  - 1 x daily - 3 x weekly - 4 sets - 5 reps - Supine Shoulder Overhead Flexion with Medicine Ball  - 1 x daily - 3 x weekly - 3 sets - 10 reps -seated cx spine retraction to improve posture and decr. Pain (not printed) Cues and demo for proper technique. S for safety.     SELF CARE:  PATIENT EDUCATION:  Education details: PT educated pt on goal progress and progressed HEP to decr. Back pain, neck pain, and continued decr. Leakage. Person educated: Patient Education method: Explanation, Demonstration, and Handouts Education comprehension: verbalized understanding, returned demonstration, and needs further education  HOME EXERCISE PROGRAM: [phone]  ASSESSMENT:  CLINICAL IMPRESSION: Skilled session focused assessing STGs, pt demonstrated progress as she met or partially met all STGs. Pt continues to require cues to improve coordination during HEP and slow down to  perform correctly. Pt noted to utilize hamstrings as primary hip extensors vs. Glute max, required max cues for proper technique during standing hip ext at counter. The following impairments were continue to be noted: limited ROM, back pain, postural dysfunction, decr. Strength, impaired balance with hx of falls, nocturia, SUI. Pt would benefit from skilled PT to improve safety and decr. Pain during all ADLs.   OBJECTIVE IMPAIRMENTS: Abnormal gait, decreased balance, decreased coordination, decreased endurance, difficulty walking, decreased ROM, decreased strength, hypomobility, increased fascial restrictions, impaired flexibility, postural dysfunction, and pain.   ACTIVITY LIMITATIONS: carrying, lifting, bending, standing, squatting, sleeping, transfers, continence, dressing, locomotion level, and caring for others  PARTICIPATION LIMITATIONS: meal prep, cleaning, laundry, interpersonal relationship, shopping, community activity, and church  PERSONAL FACTORS: Age, Past/current experiences, and 3+ comorbidities: see above. are also affecting patient's functional outcome.   REHAB POTENTIAL: Good  CLINICAL DECISION MAKING: Stable/uncomplicated  EVALUATION COMPLEXITY: Low   GOALS: Goals reviewed with patient? Yes  SHORT TERM GOALS: Target date: for all STGs: 06/02/24  Pt will be IND in HEP to improve pain, strength, coordination. Baseline: no HEP; 10/13: Goal status: PARTIALLY MET   2.  Pt will demo proper toileting posture to fully empty bladder and reduce straining during bowel movement. Baseline:  unable to demo Goal status: MET  3.   Pt will improve hip and core strength to decr. Back pain to </=4/10 at worst when standing for >10 minutes. Baseline: 5-6/10 at worst; 10/13: 4-5/10 back pain with standing Goal status: PARTIALLY MET  4.  Pt will demonstrated improved relaxation  and contraction of PFM with coordination of breath to reduce urinary leakage to </=once/week. Baseline:  1-2/week; 10/13: she hasn't leaked in the last two weeks.  Goal status: MET  5.  Finish exam and write goals as indicated. Baseline: limited by time constraints Goal status: MET   LONG TERM GOALS: Target date: for all LTGs: 06/30/24  Pt will demonstrated improved relaxation and contraction of PFM with coordination of breath to reduce urinary leakage to zero/week. Baseline: 1-2/week; 10/13: she hasn't leaked in the last two weeks.  Goal status: MET  2.  Pt will report walking and using elliptical and strength training or gym classes to maximize gains made in PT. Baseline: no HEP Goal status: DEFERRED  3.   Pt will improve hip and core strength to decr. Back pain to </=2/10 at worst when standing for >10 minutes. Baseline:  Goal status: INITIAL  4.   Pt will incr. Water intake to >/=64 oz. Per day and incr. Fiber intake to >/=25g to reduce constipation. Baseline: about 20 oz./day and metamucil Goal status: INITIAL   PLAN: progress HEP to engage glute max vs. hamstrings, internal assessment prn, manual therapy, strengthening .    PT FREQUENCY: 1x/week  PT DURATION: 8 weeks  PLANNED INTERVENTIONS: 97164- PT Re-evaluation, 97110-Therapeutic exercises, 97530- Therapeutic activity, 97112- Neuromuscular re-education, 97535- Self Care, [id number]- Manual therapy, [phone]- Gait training, [phone] (1-2 muscles), [phone] (3+ muscles)- Dry Needling, Patient/Family education, Balance training, Taping, Joint mobilization, Spinal mobilization, Moist heat, and Biofeedback mobilization with caution 2/2 osteoporosis     Ellarie Picking L, PT 06/02/2024, 1:19 PM  Delon Pinal, PT,DPT 06/02/24 1:19 PM Phone: [phone] Fax: [phone]

## 2024-06-09 ENCOUNTER — Other Ambulatory Visit: Payer: Self-pay

## 2024-06-09 ENCOUNTER — Ambulatory Visit

## 2024-06-09 DIAGNOSIS — N393 Stress incontinence (female) (male): Secondary | ICD-10-CM

## 2024-06-09 DIAGNOSIS — R2689 Other abnormalities of gait and mobility: Secondary | ICD-10-CM

## 2024-06-09 DIAGNOSIS — M6281 Muscle weakness (generalized): Secondary | ICD-10-CM

## 2024-06-09 DIAGNOSIS — R293 Abnormal posture: Secondary | ICD-10-CM

## 2024-06-09 NOTE — Therapy (Signed)
 OUTPATIENT PHYSICAL THERAPY FEMALE PELVIC TREATMENT   Patient Name: Gwendolyn King MRN: [id number] DOB:1941-07-30, 83 y.o., female Today's Date: 06/09/2024  END OF SESSION:  PT End of Session - 06/09/24 1317     Visit Number 5    Number of Visits 9    Date for Recertification  07/04/24    Authorization Type [employer]    Progress Note Due on Visit 10    PT Start Time 1319    PT Stop Time 1402    PT Time Calculation (min) 43 min    Activity Tolerance Patient tolerated treatment well    Behavior During Therapy WFL for tasks assessed/performed          Past Medical History:  Diagnosis Date   Allergy    hay fever, fall allergies   Arthritis    Asthma    as a child, dad was smoker   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   CAD (coronary artery disease)    a. 10/2021 NSTEMI/PCI: LM 23m/d, LAD 55ost, 99/ (2.5x15 Onyx Frontier DES), D2 90, LCX 80p/m (2.5x30 Onyx Frontier DES), RCA 50ost, 60p, 38m (nl iFR throughout). LVEDP 20-87mmHg.   Cataract    Bil   Chicken pox    Chronic HFrEF (heart failure with reduced ejection fraction) (HCC)    a. 10/2021 Echo: EF 35-40%, mod asymm LVH w/ sev mid-apical ant, apical HK. GrI DD. Nl RV fxn.   Chronic kidney disease    stage 3   Colon polyp    Constipation    Hypercalcemia 10/24/2021   Hyperlipidemia    Hypothyroidism    Ischemic cardiomyopathy    a. 10/2021 Echo: EF 35-40%.   NSTEMI (non-ST elevated myocardial infarction) (HCC) 10/24/2021   Palpitations    over 20 yeras ago   Personal history of radiation therapy    Syncope 07/13/2023   Thoracic back pain 11/04/2021   Thyroid  disease    Wears hearing aid in both ears    Past Surgical History:  Procedure Laterality Date   ABDOMINAL HYSTERECTOMY  1981   APPENDECTOMY  2012   BREAST BIOPSY Right 05/08/2017   Affirm Bx-DUCTAL CARCINOMA IN SITU (DCIS) WITH ONE FOCUS SUSPICIOUS FOR  invasive   BREAST BIOPSY Right 05/17/2017   us  bx done at DR. 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 2 microscopic foci lateral inferior margins. Patient elected not to proceed to reexcision.   CATARACT EXTRACTION W/PHACO Right 03/27/2022   Procedure: CATARACT EXTRACTION PHACO AND INTRAOCULAR LENS PLACEMENT (IOC) RIGHT VIVITY LENS;  Surgeon: Myrna Adine Anes, MD;  Location: [facility];  Service: Ophthalmology;  Laterality: Right;  5.49 00:39.1   CATARACT EXTRACTION W/PHACO Left 04/10/2022   Procedure: CATARACT EXTRACTION PHACO AND INTRAOCULAR LENS PLACEMENT (IOC) LEFT VIVITY LENS 3.92 00:32.4;  Surgeon: Myrna Adine Anes, MD;  Location: [facility];  Service: Ophthalmology;  Laterality: Left;   COLONOSCOPY     COLONOSCOPY W/ POLYPECTOMY  CORONARY PRESSURE/FFR STUDY N/A 10/25/2021   Procedure: INTRAVASCULAR PRESSURE WIRE/FFR STUDY;  Surgeon: Mady Bruckner, MD;  Location: [facility];  Service: Cardiovascular;  Laterality: N/A;   CORONARY STENT INTERVENTION N/A 10/25/2021   Procedure: CORONARY STENT INTERVENTION;  Surgeon: Mady Bruckner, MD;  Location: [facility];  Service: Cardiovascular;  Laterality: N/A;   CYSTOCELE REPAIR N/A 09/11/2019   Procedure: ANTERIOR COLPORRHAPHY;  Surgeon: Arloa Lamar SQUIBB, MD;  Location: [facility];  Service: Gynecology;  Laterality: N/A;   FINGER FRACTURE SURGERY Right    5th   HAMMER TOE SURGERY  09/20/2017   JOINT REPLACEMENT     right knee   LEFT  HEART CATH AND CORONARY ANGIOGRAPHY N/A 10/25/2021   Procedure: LEFT HEART CATH AND CORONARY ANGIOGRAPHY;  Surgeon: Mady Bruckner, MD;  Location: [facility];  Service: Cardiovascular;  Laterality: N/A;   ROTATOR CUFF REPAIR Right    TONSILLECTOMY AND ADENOIDECTOMY  1963   TOTAL KNEE ARTHROPLASTY Right 10/17/2016   Procedure: TOTAL KNEE ARTHROPLASTY;  Surgeon: Maude Herald, MD;  Location: [facility];  Service: Orthopedics;  Laterality: Right;   VAGINAL DELIVERY     3   Patient Active Problem List   Diagnosis Date Noted   Allergic rhinitis due to animal (cat) (dog) hair and dander 02/19/2024   Allergic rhinitis due to pollen 02/19/2024   Urge incontinence 02/19/2024   CAD S/P percutaneous coronary angioplasty 02/19/2024   Polyp of descending colon 02/19/2024   Carotid stenosis 07/04/2022   Right shoulder pain 03/08/2022   Sleeping difficulty 03/08/2022   Ischemic cardiomyopathy    Chronic low back pain 05/04/2021   Myalgia due to HMG CoA reductase inhibitor 11/01/2020   Thrombocytopenia 11/01/2020   Osteoporosis 06/15/2020   Venous insufficiency 05/03/2020   S/P hysterectomy 07/31/2018   CKD (chronic kidney disease) stage 3, GFR 30-59 ml/min (HCC) 05/22/2018   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 Luke Shade at [place]   REFERRING PROVIDER: Harland Birkenhead MD  REFERRING DIAG: SUI  THERAPY DIAG:  SUI (stress urinary incontinence, female)  Muscle weakness (generalized)  Other abnormalities of gait and mobility  Abnormal posture  Rationale for Evaluation and Treatment: Rehabilitation  ONSET DATE: 04/22/24 referral date but SUI started about a year ago  SUBJECTIVE:  SUBJECTIVE STATEMENT: Pt reported she's been feeling okay overall, a little stressed with caring for her husband (dementia). Pt reported she hasn't done all of her exercises since last visit (maybe three times a week). She lost the papers for the last two exercises.    EVAL: URINARY FUNCTION: SUI began about a year ago when she had a cold, she also has urgency and it continued. Pt reported she voids every few hours. Pt denied pain with voiding. Pt states stream of urine is strong most of the time. Pt gets up approx. 1x/night, but sometimes she'll get up 4-5x/night. Pt experiences leakage (usually a dribble but sometimes its enough to change underwear or pants), approx. 1-2/week. Does not wear pads or pantyliners. Pt had bladder surgery for prolapse.  BOWEL FUNCTION: a couple times a day. Pt has hemorrhoids and occasionally has pain with BM. 75% of the time a type 3 or 4. She does take something similar to Metamucil and it helps.  CORE STABILITY: hx of hysterectomy (vaginally), hx of bladder surgery for prolapse, and appendectomy. Pt has fallen on her tailbone a few times and was told she has scoliosis. Most recent fall was 11/2023 when she tripped over a box. Pt reported chronic LBP, at worst: 5-6/10 and massager tool helps, at best: 0/10 when seated.  SEXUAL FUNCTION: pt has not had intercourse in several years as pt's husband has dementia and ED. Pt denied pain with past experiences during intercourse, tampon insertion or OBGYN exams. Pt had difficulty climaxing.   Fluid intake: a cup of coffee, water with flavoring (Mio), sweet tea (overall water: at least two, 20 oz. Cup), occ wine or soda  FUNCTIONAL LIMITATIONS: no  PERTINENT HISTORY:  Medications for current condition: none Surgeries: see above Other: HTN, venous insufficiency, carotid stenosis, CAD s/p percutaneous coronary angioplasty, , hypothyroidism, osteoporosis, CKD stage 3, polyps in  colon, HLD, chronic LBP, ductal carcinoma in situ R breast s/p treatment (lumpectomy), urge incontinence, hysterectomy, NSTEMI, asthma, cataract B extraction, CVA in 2023, R TKA, R hammer toe, R shoulder surgery Sexual abuse: No   PAIN:  Are you having pain? No 06/09/24  NPRS scale: 0/10 Back pain: 5-6/10 but eases with sitting and massage.  PRECAUTIONS: None  RED FLAGS: None   WEIGHT BEARING RESTRICTIONS: No  FALLS:  Has patient fallen in last 6 months? No  OCCUPATION: retired  ACTIVITY LEVEL : foot cycle, elliptical bike (hasn't used it in awhile), volunteers at church  PLOF: Independent  PATIENT GOALS: To be able to go to the bathroom and not leak, and to strengthen muscles overall.    BOWEL MOVEMENT: Pain with bowel movement: Yes occ 2/2 hemorrhoids Type of bowel movement:Type (Bristol Stool Scale) types 3 and 4 with fiber supplement Fully empty rectum: Yes:   Leakage: No                                                  Pads: No Fiber supplement/laxative Yes   URINATION: Pain with urination: No Fully empty bladder: Yes:                                  Post-void dribble: No Stream: Strong typically Urgency: Yes  Frequency:during the day every few hours.  Nocturia: Yes: 1-5x/night   Leakage: Walking to the bathroom, Coughing, Sneezing, Laughing, and Lifting Pads/briefs: No but has to change underwear at times  INTERCOURSE:  Ability to have vaginal penetration has not been sexually active in a few years 2/2 husband's health declining but no hx of pain. Climax: difficulty Marinoff Scale: 0/3 Lubricant:  PREGNANCY: Number of pregnancies:  Vaginal deliveries 2 Tearing Yes:   Episiotomy No C-section deliveries 0 Currently pregnant No  PROLAPSE: None had cystocele repaired    OBJECTIVE:  Note: Objective measures were completed at Evaluation unless otherwise noted.   COGNITION: Overall cognitive  status: Within functional limits for tasks assessed     SENSATION: Light touch: Appears intact   FUNCTIONAL TESTS:   Single leg stance:  Rt: LOB and had to hold wall  Lt: LOB and had to hold wall  GAIT: Assistive device utilized: None Comments: decr. Stride length, decr. Trunk rotation, post. Pelvic tilt  POSTURE: forward head, decreased lumbar lordosis, increased thoracic kyphosis, decreased thoracic kyphosis, and posterior pelvic tilt T1-3 incr. Kyphosis, T4-12 decr. kyphosis   LUMBARAROM/PROM:   A/PROM A/PROM  Eval (% available)  Flexion WFL  Extension Limited approx. 25%  Right lateral flexion WFL with pain  Left lateral flexion WFL  Right rotation Limited by 50%  Left rotation Limited by 25%   (Blank rows = not tested)  LOWER EXTREMITY ROM: all WFL except for limited B hip IR  Active ROM Right eval Left eval  Hip flexion    Hip extension    Hip abduction    Hip adduction    Hip internal rotation    Hip external rotation    Knee flexion    Knee extension    Ankle dorsiflexion    Ankle plantarflexion    Ankle inversion    Ankle eversion     (Blank rows = not tested)  LOWER EXTREMITY MMT:  MMT Right eval Left eval  Hip flexion 4- 4-  Hip extension    Hip abduction 2 2  Hip adduction 2 2  Hip internal rotation 4- 4-  Hip external rotation 4- 4-  Knee flexion 4- 4-  Knee extension 5 5  Ankle dorsiflexion 4+ 4+  Ankle plantarflexion    Ankle inversion    Ankle eversion     (Blank rows = not tested) PALPATION:  General: no TTP of spine in standing 9/23: TTP over lower lumbar and sacrum, which pt reports is chronic, L convex tx spine curve (scoliosis hx), no TTP over hips/glutes or LEs. Limited B hip IR. Hx of osteoporosis so did not perform PAMs but tx flexion noted with FHP. No DR noted, incr. Infrasternal rib angle (L side)   PELVIC MMT:   MMT eval  Vaginal   Internal Anal Sphincter   External Anal Sphincter   Puborectalis   Diastasis  Recti   (Blank rows = not tested)        TONE:   PROLAPSE:   TODAY'S TREATMENT:  DATE: 06/09/24    NMR: Access Code: [email or web address] URL: [email or web address] Date: 06/09/2024 Prepared by: Delon Pinal  Exercises - Supine Angels  - 1 x daily - 7 x weekly - 1 sets - 10 reps REVIEW - Sidelying Open Book  - 1 x daily - 7 x weekly - 1 sets - 10 reps REVIEW - Clamshell with Resistance  - 1 x daily - 3 x weekly - 3 sets - 10 reps - Supine Bridge with Mini Swiss Ball Between Knees  - 1 x daily - 3 x weekly - 3 sets - 10 reps -Supine diaphragmatic breathing x3 reps (cues for rib expansion vs. Focusing on belly towards spine) - Hooklying Small March  - 1 x daily - 3 x weekly - 4 sets - 5 reps - Supine Shoulder Overhead Flexion with Medicine Ball  - 1 x daily - 3 x weekly - 3 sets - 10 reps -Cx retraction supine x5 reps -PFM contraction and relaxation in supine (cues to stop B hip add from activating and decr. Glute activation). Cues for quick flicks to make it to the bathroom.  -hip ext without hamstrings activating first: at counter x10 reps/LE. Cues and demo for proper technique. S for safety. No pain reported    SELF CARE:  PATIENT EDUCATION:  Education details: PT re-educated pt on IAP and how diaphragm, PFM, abs, and back muscles are impacted by IAP and vice versa. Had to provided cues for technique during HEP for coordination. Pt has brought up her husband's dementia several times and it is hard on pt, PT encouraged pt to go to dementia support group in [place] that she wanted to try. Person educated: Patient Education method: Explanation, Demonstration, and Handouts Education comprehension: verbalized understanding, returned demonstration, and needs further education  HOME EXERCISE PROGRAM: [phone]  ASSESSMENT:  CLINICAL  IMPRESSION: Skilled session focused on progressing strength training and incorporating PFM contraction and relaxation to decr. Leakage. Pt demonstrated progress as she tolerated progression of HEP without pain. Pt continues to require cues to coordinate breath work with PFM and strengthening in order to manage IAP. The following impairments were continue to be noted: limited ROM, back pain, postural dysfunction, decr. Strength, impaired balance with hx of falls, nocturia, SUI. Pt would benefit from skilled PT to improve safety and decr. Pain during all ADLs.   OBJECTIVE IMPAIRMENTS: Abnormal gait, decreased balance, decreased coordination, decreased endurance, difficulty walking, decreased ROM, decreased strength, hypomobility, increased fascial restrictions, impaired flexibility, postural dysfunction, and pain.   ACTIVITY LIMITATIONS: carrying, lifting, bending, standing, squatting, sleeping, transfers, continence, dressing, locomotion level, and caring for others  PARTICIPATION LIMITATIONS: meal prep, cleaning, laundry, interpersonal relationship, shopping, community activity, and church  PERSONAL FACTORS: Age, Past/current experiences, and 3+ comorbidities: see above. are also affecting patient's functional outcome.   REHAB POTENTIAL: Good  CLINICAL DECISION MAKING: Stable/uncomplicated  EVALUATION COMPLEXITY: Low   GOALS: Goals reviewed with patient? Yes  SHORT TERM GOALS: Target date: for all STGs: 06/02/24  Pt will be IND in HEP to improve pain, strength, coordination. Baseline: no HEP; 10/13: Goal status: PARTIALLY MET   2.  Pt will demo proper toileting posture to fully empty bladder and reduce straining during bowel movement. Baseline:  unable to demo Goal status: MET  3.   Pt will improve hip and core strength to decr. Back pain to </=4/10 at worst when standing for >10 minutes. Baseline: 5-6/10 at worst; 10/13: 4-5/10 back pain with standing Goal status: PARTIALLY  MET  4.  Pt will demonstrated improved relaxation and contraction of PFM with coordination of breath to reduce urinary leakage to </=once/week. Baseline: 1-2/week; 10/13: she hasn't leaked in the last two weeks.  Goal status: MET  5.  Finish exam and write goals as indicated. Baseline: limited by time constraints Goal status: MET   LONG TERM GOALS: Target date: for all LTGs: 06/30/24  Pt will demonstrated improved relaxation and contraction of PFM with coordination of breath to reduce urinary leakage to zero/week. Baseline: 1-2/week; 10/13: she hasn't leaked in the last two weeks.  Goal status: MET  2.  Pt will report walking and using elliptical and strength training or gym classes to maximize gains made in PT. Baseline: no HEP Goal status: DEFERRED  3.   Pt will improve hip and core strength to decr. Back pain to </=2/10 at worst when standing for >10 minutes. Baseline:  Goal status: INITIAL  4.   Pt will incr. Water intake to >/=64 oz. Per day and incr. Fiber intake to >/=25g to reduce constipation. Baseline: about 20 oz./day and metamucil Goal status: INITIAL   PLAN: add resistance to strengthening as tolerated, internal assessment prn, manual therapy.    PT FREQUENCY: 1x/week  PT DURATION: 8 weeks  PLANNED INTERVENTIONS: 97164- PT Re-evaluation, 97110-Therapeutic exercises, 97530- Therapeutic activity, 97112- Neuromuscular re-education, 97535- Self Care, [id number]- Manual therapy, [phone]- Gait training, [phone] (1-2 muscles), 20561 (3+ muscles)- Dry Needling, Patient/Family education, Balance training, Taping, Joint mobilization, Spinal mobilization, Moist heat, and Biofeedback mobilization with caution 2/2 osteoporosis     Annetta Deiss L, PT 06/09/2024, 2:09 PM  Delon Pinal, PT,DPT 06/09/24 2:09 PM Phone: [phone] Fax: [phone]

## 2024-06-19 ENCOUNTER — Encounter

## 2024-06-19 ENCOUNTER — Ambulatory Visit
Admission: RE | Admit: 2024-06-19 | Discharge: 2024-06-19 | Disposition: A | Discharge status: Home / Self Care | Source: Ambulatory Visit | Attending: Oncology | Admitting: Oncology

## 2024-06-19 DIAGNOSIS — Z1231 Encounter for screening mammogram for malignant neoplasm of breast: Secondary | ICD-10-CM | POA: Diagnosis present

## 2024-06-26 ENCOUNTER — Other Ambulatory Visit: Payer: Self-pay

## 2024-06-26 ENCOUNTER — Ambulatory Visit: Attending: Obstetrics & Gynecology

## 2024-06-26 DIAGNOSIS — R293 Abnormal posture: Secondary | ICD-10-CM | POA: Diagnosis present

## 2024-06-26 DIAGNOSIS — N393 Stress incontinence (female) (male): Secondary | ICD-10-CM | POA: Diagnosis present

## 2024-06-26 DIAGNOSIS — R2689 Other abnormalities of gait and mobility: Secondary | ICD-10-CM | POA: Diagnosis present

## 2024-06-26 DIAGNOSIS — M6281 Muscle weakness (generalized): Secondary | ICD-10-CM | POA: Diagnosis present

## 2024-06-26 NOTE — Patient Instructions (Signed)
 Pelvic floor physical therapist is at an outpatient rehab center but it is located inside a hospital.

## 2024-06-26 NOTE — Addendum Note (Signed)
 Addended by: CLEOTILDE DELON CROME on: 06/26/2024 04:24 PM   Modules accepted: Orders

## 2024-06-26 NOTE — Therapy (Addendum)
 OUTPATIENT PHYSICAL THERAPY FEMALE PELVIC TREATMENT   Patient Name: Gwendolyn King MRN: [id number] DOB:[birth year], 83 y.o., female Today's Date: 06/26/2024  END OF SESSION:  PT End of Session - 06/26/24 1528     Visit Number 6    Number of Visits 9    Date for Recertification  07/04/24    Authorization Type [employer]    Progress Note Due on Visit 10    PT Start Time 1530    PT Stop Time 1615    PT Time Calculation (min) 45 min    Activity Tolerance Patient tolerated treatment well    Behavior During Therapy [facility] for tasks assessed/performed          Past Medical History:  Diagnosis Date   Allergy    hay fever, fall allergies   Arthritis    Asthma    as a child, dad was smoker   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   CAD (coronary artery disease)    a. 10/2021 NSTEMI/PCI: LM 29m/d, LAD 55ost, 99/ (2.5x15 Onyx Frontier DES), D2 90, LCX 80p/m (2.5x30 Onyx Frontier DES), RCA 50ost, 60p, 86m (nl iFR throughout). LVEDP 20-11mmHg.   Cataract    Bil   Chicken pox    Chronic HFrEF (heart failure with reduced ejection fraction) (HCC)    a. 10/2021 Echo: EF 35-40%, mod asymm LVH w/ sev mid-apical ant, apical HK. GrI DD. Nl RV fxn.   Chronic kidney disease    stage 3   Colon polyp    Constipation    Hypercalcemia 10/24/2021   Hyperlipidemia    Hypothyroidism    Ischemic cardiomyopathy    a. 10/2021 Echo: EF 35-40%.   NSTEMI (non-ST elevated myocardial infarction) (HCC) 10/24/2021   Palpitations    over 20 yeras ago   Personal history of radiation therapy    Syncope 07/13/2023   Thoracic back pain 11/04/2021   Thyroid  disease    Wears hearing aid in both ears    Past Surgical History:  Procedure Laterality Date   ABDOMINAL HYSTERECTOMY  1981   APPENDECTOMY  2012   BREAST BIOPSY Right 05/08/2017   Affirm Bx-DUCTAL CARCINOMA IN SITU (DCIS) WITH ONE FOCUS SUSPICIOUS FOR  invasive   BREAST BIOPSY Right 05/17/2017   us  bx done at DR. 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 2 microscopic foci lateral inferior margins. Patient elected not to proceed to reexcision.   CATARACT EXTRACTION W/PHACO Right 03/27/2022   Procedure: CATARACT EXTRACTION PHACO AND INTRAOCULAR LENS PLACEMENT (IOC) RIGHT VIVITY LENS;  Surgeon: Myrna Adine Anes, MD;  Location: [facility];  Service: Ophthalmology;  Laterality: Right;  5.49 00:39.1   CATARACT EXTRACTION W/PHACO Left 04/10/2022   Procedure: CATARACT EXTRACTION PHACO AND INTRAOCULAR LENS PLACEMENT (IOC) LEFT VIVITY LENS 3.92 00:32.4;  Surgeon: Myrna Adine Anes, MD;  Location: [facility];  Service: Ophthalmology;  Laterality: Left;   COLONOSCOPY     COLONOSCOPY W/ POLYPECTOMY  CORONARY PRESSURE/FFR STUDY N/A 10/25/2021   Procedure: INTRAVASCULAR PRESSURE WIRE/FFR STUDY;  Surgeon: Mady Bruckner, MD;  Location: [facility];  Service: Cardiovascular;  Laterality: N/A;   CORONARY STENT INTERVENTION N/A 10/25/2021   Procedure: CORONARY STENT INTERVENTION;  Surgeon: Mady Bruckner, MD;  Location: [facility];  Service: Cardiovascular;  Laterality: N/A;   CYSTOCELE REPAIR N/A 09/11/2019   Procedure: ANTERIOR COLPORRHAPHY;  Surgeon: Arloa Lamar SQUIBB, MD;  Location: [facility];  Service: Gynecology;  Laterality: N/A;   FINGER FRACTURE SURGERY Right    5th   HAMMER TOE SURGERY  09/20/2017   JOINT REPLACEMENT     right knee   LEFT  HEART CATH AND CORONARY ANGIOGRAPHY N/A 10/25/2021   Procedure: LEFT HEART CATH AND CORONARY ANGIOGRAPHY;  Surgeon: Mady Bruckner, MD;  Location: [facility];  Service: Cardiovascular;  Laterality: N/A;   ROTATOR CUFF REPAIR Right    TONSILLECTOMY AND ADENOIDECTOMY  1963   TOTAL KNEE ARTHROPLASTY Right 10/17/2016   Procedure: TOTAL KNEE ARTHROPLASTY;  Surgeon: Maude Herald, MD;  Location: [facility];  Service: Orthopedics;  Laterality: Right;   VAGINAL DELIVERY     3   Patient Active Problem List   Diagnosis Date Noted   Allergic rhinitis due to animal (cat) (dog) hair and dander 02/19/2024   Allergic rhinitis due to pollen 02/19/2024   Urge incontinence 02/19/2024   CAD S/P percutaneous coronary angioplasty 02/19/2024   Polyp of descending colon 02/19/2024   Carotid stenosis 07/04/2022   Right shoulder pain 03/08/2022   Sleeping difficulty 03/08/2022   Ischemic cardiomyopathy    Chronic low back pain 05/04/2021   Myalgia due to HMG CoA reductase inhibitor 11/01/2020   Thrombocytopenia 11/01/2020   Osteoporosis 06/15/2020   Venous insufficiency 05/03/2020   S/P hysterectomy 07/31/2018   CKD (chronic kidney disease) stage 3, GFR 30-59 ml/min (HCC) 05/22/2018   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 Luke Shade at [place]   REFERRING PROVIDER: Harland Birkenhead MD  REFERRING DIAG: SUI  THERAPY DIAG:  SUI (stress urinary incontinence, female)  Muscle weakness (generalized)  Other abnormalities of gait and mobility  Abnormal posture  Rationale for Evaluation and Treatment: Rehabilitation  ONSET DATE: 04/22/24 referral date but SUI started about a year ago  SUBJECTIVE:  SUBJECTIVE STATEMENT: Pt reported she's still performing a few times a week but not as consistently. She has been busy this week, appt's for her husband and herself. Pt reported leakage has been a little better. Pt reported nocturia, still approx. Once/night. She hasn't had any nights recently of 4-5x/night. Pt reported she still uses her massager for back pain (intermittently).  Pt likes the cervical retraction. Pt has been eating more kiwi. Pt reported improvement since starting PHPT, approx. 33% on GROC. She is able to make it to the bathroom unless really urgent, back pain has mildly improved. Pt reported hamstring cramps during bridges last week and this week.    EVAL: URINARY FUNCTION: SUI began about a year ago when she had a cold, she also has urgency and it continued. Pt reported she voids every few hours. Pt denied pain with voiding. Pt states stream of urine is strong most of the time. Pt gets up approx. 1x/night, but sometimes she'll get up 4-5x/night. Pt experiences leakage (usually a dribble but sometimes its enough to change underwear or pants), approx. 1-2/week. Does not wear pads or pantyliners. Pt had bladder surgery for prolapse.  BOWEL FUNCTION: a couple times a day. Pt has hemorrhoids and occasionally has pain with BM. 75% of the time a type 3 or 4. She does take something similar to Metamucil and it helps.  CORE STABILITY: hx of hysterectomy (vaginally), hx of bladder surgery for prolapse, and appendectomy. Pt has fallen on her tailbone a few times and was told she has scoliosis. Most recent fall was 11/2023 when she tripped over a box. Pt reported chronic LBP, at worst: 5-6/10 and massager tool helps, at best: 0/10 when seated.  SEXUAL FUNCTION: pt has not had intercourse in several years as pt's husband has dementia and ED. Pt denied pain with past experiences during intercourse, tampon insertion or OBGYN exams. Pt had difficulty  climaxing.   Fluid intake: a cup of coffee, water with flavoring (Mio), sweet tea (overall water: at least two, 20 oz. Cup), occ wine or soda  FUNCTIONAL LIMITATIONS: no  PERTINENT HISTORY:  Medications for current condition: none Surgeries: see above Other: HTN, venous insufficiency, carotid stenosis, CAD s/p percutaneous coronary angioplasty, , hypothyroidism, osteoporosis, CKD stage 3, polyps in colon, HLD, chronic LBP, ductal carcinoma in situ R breast s/p treatment (lumpectomy), urge incontinence, hysterectomy, NSTEMI, asthma, cataract B extraction, CVA in 2023, R TKA, R hammer toe, R shoulder surgery Sexual abuse: No   PAIN:  Are you having pain? No 06/26/24  NPRS scale: 0/10 Back pain: 5-6/10 but eases with sitting and massage.  PRECAUTIONS: None  RED FLAGS: None   WEIGHT BEARING RESTRICTIONS: No  FALLS:  Has patient fallen in last 6 months? No  OCCUPATION: retired  ACTIVITY LEVEL : foot cycle, elliptical bike (hasn't used it in awhile), volunteers at church  PLOF: Independent  PATIENT GOALS: To be able to go to the bathroom and not leak, and to strengthen muscles overall.    BOWEL MOVEMENT: Pain with bowel movement: Yes occ 2/2 hemorrhoids Type of bowel movement:Type (Bristol Stool Scale) types 3 and 4 with fiber supplement Fully empty rectum: Yes:   Leakage: No                                                  Pads: No Fiber supplement/laxative Yes  URINATION: Pain with urination: No Fully empty bladder: Yes:                                  Post-void dribble: No Stream: Strong typically Urgency: Yes  Frequency:during the day every few hours.                                                         Nocturia: Yes: 1-5x/night   Leakage: Walking to the bathroom, Coughing, Sneezing, Laughing, and Lifting Pads/briefs: No but has to change underwear at times  INTERCOURSE:  Ability to have vaginal penetration has not been sexually active in a few years 2/2  husband's health declining but no hx of pain. Climax: difficulty Marinoff Scale: 0/3 Lubricant:  PREGNANCY: Number of pregnancies:  Vaginal deliveries 2 Tearing Yes:   Episiotomy No C-section deliveries 0 Currently pregnant No  PROLAPSE: None had cystocele repaired    OBJECTIVE:  Note: Objective measures were completed at Evaluation unless otherwise noted.   COGNITION: Overall cognitive status: Within functional limits for tasks assessed     SENSATION: Light touch: Appears intact   FUNCTIONAL TESTS:   Single leg stance:  Rt: LOB and had to hold wall  Lt: LOB and had to hold wall  GAIT: Assistive device utilized: None Comments: decr. Stride length, decr. Trunk rotation, post. Pelvic tilt  POSTURE: forward head, decreased lumbar lordosis, increased thoracic kyphosis, decreased thoracic kyphosis, and posterior pelvic tilt T1-3 incr. Kyphosis, T4-12 decr. kyphosis   LUMBARAROM/PROM:   A/PROM A/PROM  Eval (% available)  Flexion WFL  Extension Limited approx. 25%  Right lateral flexion WFL with pain  Left lateral flexion WFL  Right rotation Limited by 50%  Left rotation Limited by 25%   (Blank rows = not tested)  LOWER EXTREMITY ROM: all WFL except for limited B hip IR  Active ROM Right eval Left eval  Hip flexion    Hip extension    Hip abduction    Hip adduction    Hip internal rotation    Hip external rotation    Knee flexion    Knee extension    Ankle dorsiflexion    Ankle plantarflexion    Ankle inversion    Ankle eversion     (Blank rows = not tested)  LOWER EXTREMITY MMT:  MMT Right eval Left eval  Hip flexion 4- 4-  Hip extension    Hip abduction 2 2  Hip adduction 2 2  Hip internal rotation 4- 4-  Hip external rotation 4- 4-  Knee flexion 4- 4-  Knee extension 5 5  Ankle dorsiflexion 4+ 4+  Ankle plantarflexion    Ankle inversion    Ankle eversion     (Blank rows = not tested)    06/26/24: MMT Right eval Left eval  Hip  flexion 4+ 4+  Hip extension    Hip abduction with slight resistance 3 3  Hip adduction 3+ 3+  Hip internal rotation 5 5  Hip external rotation 4+ 4+  Knee flexion 4+ 4+  Knee extension 5 5  Ankle dorsiflexion 5 5  Ankle plantarflexion    Ankle inversion    Ankle eversion     PALPATION:  General: no TTP of spine in standing  9/23: TTP over lower lumbar and sacrum, which pt reports is chronic, L convex tx spine curve (scoliosis hx), no TTP over hips/glutes or LEs. Limited B hip IR. Hx of osteoporosis so did not perform PAMs but tx flexion noted with FHP. No DR noted, incr. Infrasternal rib angle (L side)   PELVIC MMT:   MMT eval  Vaginal   Internal Anal Sphincter   External Anal Sphincter   Puborectalis   Diastasis Recti   (Blank rows = not tested)        TONE:   PROLAPSE:   TODAY'S TREATMENT:                                                                                                                              DATE: 06/26/24    NMR: Access Code: [email or web address] URL: [email or web address] Date: 06/26/2024 Prepared by: Delon Pinal  - Clamshell with Resistance  - 1 x daily - 3 x weekly - 3 sets - 10 reps WITH TWO POUND WEIGHT - Supine Bridge   - 1 x daily - 3 x weekly - 3 sets - 10 reps WITH GLUTE ACTIVATION TO REDUCE HAMSTRING ACTIVATION AND CRAMPING -Cx retraction SEATED x5 reps -PFM contraction and relaxation in supine . Cues TO PERFORM QUICK AND LONG HOLDS AS PT ONLY HOLDING.  PT performed MMT of hips and LE, see MMT box above. Cues and demo for proper technique. S for safety. No pain reported    SELF CARE:  PATIENT EDUCATION:  Education details: PT educated pt on goal progress, reducing frequency to every other week based on progress and that consistency with HEP/PFM contractions and relaxation will incr. Gains.  Person educated: Patient Education method: Explanation, Demonstration, and Handouts Education comprehension: verbalized  understanding, returned demonstration, and needs further education  HOME EXERCISE PROGRAM: [phone]  ASSESSMENT:  CLINICAL IMPRESSION: Skilled session focused on assessing LTGs, pt demonstrated progress as she partially met LTGs except for gym goal (deferred) as pt cannot leave husband alone 2/2 his dementia. Pt not activating B glute max during bridges and B hamstrings took over as primary hip extensors causing cramping, which ceased with cues for glute activation. Pt demonstrated progress as LE and hip strength (MMT) has improved and she added resistance to clams. The following impairments were continue to be noted: limited ROM, back pain, postural dysfunction, decr. Strength, impaired balance with hx of falls, nocturia, SUI. Pt would benefit from skilled PT to improve safety and decr. Pain during all ADLs. PT requesting add'l visits to meet unmet goals, every other week over 8 weeks.   OBJECTIVE IMPAIRMENTS: Abnormal gait, decreased balance, decreased coordination, decreased endurance, difficulty walking, decreased ROM, decreased strength, hypomobility, increased fascial restrictions, impaired flexibility, postural dysfunction, and pain.   ACTIVITY LIMITATIONS: carrying, lifting, bending, standing, squatting, sleeping, transfers, continence, dressing, locomotion level, and caring for others  PARTICIPATION LIMITATIONS: meal prep, cleaning, laundry, interpersonal relationship, shopping, community activity, and church  PERSONAL FACTORS: Age, Past/current  experiences, and 3+ comorbidities: see above. are also affecting patient's functional outcome.   REHAB POTENTIAL: Good  CLINICAL DECISION MAKING: Stable/uncomplicated  EVALUATION COMPLEXITY: Low   GOALS: Goals reviewed with patient? Yes  SHORT TERM GOALS: Target date: for all STGs: 06/02/24. New STGs; 07/24/24  Pt will be IND in HEP to improve pain, strength, coordination. Baseline: no HEP; 10/13: not consistent Goal status: PARTIALLY  MET   2.  Pt will demo proper toileting posture to fully empty bladder and reduce straining during bowel movement. Baseline:  unable to demo Goal status: MET  3.   Pt will improve hip and core strength to decr. Back pain to </=4/10 at worst when standing for >10 minutes. Baseline: 5-6/10 at worst; 10/13: 4-5/10 back pain with standing Goal status: PARTIALLY MET  4.  Pt will demonstrated improved relaxation and contraction of PFM with coordination of breath to reduce urinary leakage to </=once/week. Baseline: 1-2/week; 10/13: she hasn't leaked in the last two weeks.  Goal status: MET  5.  Finish exam and write goals as indicated. Baseline: limited by time constraints Goal status: MET   LONG TERM GOALS: Target date: for all LTGs: 06/30/24. New LTGs: 08/21/24  Pt will demonstrated improved relaxation and contraction of PFM with coordination of breath to reduce urinary leakage to zero/week. Baseline: 1-2/week; 10/13: she hasn't leaked in the last two weeks. 11/6: didn't totally make it one time in last month but she was at her husband's appt and couldn't go when she needed but only a dribble.  Goal status: PARTIALLY MET  2.  Pt will report walking and using elliptical and strength training or gym classes to maximize gains made in PT. Baseline: no HEP 11/6: can't leave husband who has dementia Goal status: DEFERRED  3.   Pt will improve hip and core strength to decr. Back pain to </=2/10 at worst when standing for >10 minutes. Baseline: 11/6: back pain at worst: 4-5/10 and uses massager when standing > 10-15 minutes  Goal status: PARTIALLY MET  4.   Pt will incr. Water intake to >/=64 oz. Per day and incr. Fiber intake to >/=25g to reduce constipation. Baseline: about 20 oz./day and metamucil; 11/6: 45-48 oz. And clear lax (does have fiber) Goal status: PARTIALLY MET (not constipated any more and incr. Water)   PLAN: continue to add resistance to strengthening as tolerated, internal  assessment prn, manual therapy.    PT FREQUENCY: every other week  PT DURATION: 8 weeks  PLANNED INTERVENTIONS: 97164- PT Re-evaluation, 97110-Therapeutic exercises, 97530- Therapeutic activity, 97112- Neuromuscular re-education, 97535- Self Care, [id number]- Manual therapy, [phone]- Gait training, [phone] (1-2 muscles), [phone] (3+ muscles)- Dry Needling, Patient/Family education, Balance training, Taping, Joint mobilization, Spinal mobilization, Moist heat, and Biofeedback mobilization with caution 2/2 osteoporosis     Dequante Tremaine L, PT 06/26/2024, 4:22 PM  Delon Pinal, PT,DPT 06/26/24 4:22 PM Phone: [phone] Fax: [phone]

## 2024-06-27 ENCOUNTER — Ambulatory Visit

## 2024-06-27 DIAGNOSIS — Z23 Encounter for immunization: Secondary | ICD-10-CM

## 2024-06-27 NOTE — Progress Notes (Signed)
 Pt received HIGH DOSE FLU SHOT injection in Left deltoid/ muscle. Pt tolerated it well with no complaints or concerns.

## 2024-07-03 ENCOUNTER — Ambulatory Visit

## 2024-07-07 ENCOUNTER — Ambulatory Visit

## 2024-07-08 ENCOUNTER — Other Ambulatory Visit: Payer: Self-pay

## 2024-07-08 DIAGNOSIS — D0511 Intraductal carcinoma in situ of right breast: Secondary | ICD-10-CM

## 2024-07-08 DIAGNOSIS — M810 Age-related osteoporosis without current pathological fracture: Secondary | ICD-10-CM

## 2024-07-09 ENCOUNTER — Inpatient Hospital Stay: Attending: Oncology

## 2024-07-09 ENCOUNTER — Inpatient Hospital Stay: Admitting: Oncology

## 2024-07-09 ENCOUNTER — Inpatient Hospital Stay

## 2024-07-09 ENCOUNTER — Encounter: Payer: Self-pay | Admitting: Oncology

## 2024-07-09 VITALS — BP 118/62 | HR 69 | Temp 97.0°F | Resp 16 | Ht 60.0 in | Wt 153.0 lb

## 2024-07-09 DIAGNOSIS — D0511 Intraductal carcinoma in situ of right breast: Secondary | ICD-10-CM

## 2024-07-09 DIAGNOSIS — M810 Age-related osteoporosis without current pathological fracture: Secondary | ICD-10-CM | POA: Diagnosis present

## 2024-07-09 DIAGNOSIS — Z86000 Personal history of in-situ neoplasm of breast: Secondary | ICD-10-CM | POA: Insufficient documentation

## 2024-07-09 LAB — CBC WITH DIFFERENTIAL (CANCER CENTER ONLY)
Abs Immature Granulocytes: 0.02 10*3/uL (ref 0.00–0.07)
Basophils Absolute: 0 10*3/uL (ref 0.0–0.1)
Basophils Relative: 1 %
Eosinophils Absolute: 0.2 10*3/uL (ref 0.0–0.5)
Eosinophils Relative: 3 %
HCT: 45.5 % (ref 36.0–46.0)
Hemoglobin: 14.8 g/dL (ref 12.0–15.0)
Immature Granulocytes: 0 %
Lymphocytes Relative: 37 %
Lymphs Abs: 2.5 10*3/uL (ref 0.7–4.0)
MCH: 29.2 pg (ref 26.0–34.0)
MCHC: 32.5 g/dL (ref 30.0–36.0)
MCV: 89.9 fL (ref 80.0–100.0)
Monocytes Absolute: 0.6 10*3/uL (ref 0.1–1.0)
Monocytes Relative: 8 %
Neutro Abs: 3.5 10*3/uL (ref 1.7–7.7)
Neutrophils Relative %: 51 %
Platelet Count: 111 10*3/uL — ABNORMAL LOW (ref 150–400)
RBC: 5.06 MIL/uL (ref 3.87–5.11)
RDW: 12.7 % (ref 11.5–15.5)
WBC Count: 6.8 10*3/uL (ref 4.0–10.5)
nRBC: 0 % (ref 0.0–0.2)

## 2024-07-09 LAB — CMP (CANCER CENTER ONLY)
ALT: 18 U/L (ref 0–44)
AST: 24 U/L (ref 15–41)
Albumin: 4 g/dL (ref 3.5–5.0)
Alkaline Phosphatase: 35 U/L — ABNORMAL LOW (ref 38–126)
Anion gap: 9 (ref 5–15)
BUN: 43 mg/dL — ABNORMAL HIGH (ref 8–23)
CO2: 27 mmol/L (ref 22–32)
Calcium: 10.5 mg/dL — ABNORMAL HIGH (ref 8.9–10.3)
Chloride: 103 mmol/L (ref 98–111)
Creatinine: 1.63 mg/dL — ABNORMAL HIGH (ref 0.44–1.00)
GFR, Estimated: 31 mL/min — ABNORMAL LOW
Glucose, Bld: 87 mg/dL (ref 70–99)
Potassium: 4.2 mmol/L (ref 3.5–5.1)
Sodium: 139 mmol/L (ref 135–145)
Total Bilirubin: 0.9 mg/dL (ref 0.0–1.2)
Total Protein: 6.6 g/dL (ref 6.5–8.1)

## 2024-07-09 MED ORDER — DENOSUMAB 60 MG/ML ~~LOC~~ SOSY
60.0000 mg | PREFILLED_SYRINGE | Freq: Once | SUBCUTANEOUS | Status: AC
  Administered 2024-07-09: 60 mg via SUBCUTANEOUS
  Filled 2024-07-09: qty 1

## 2024-07-09 NOTE — Progress Notes (Unsigned)
 [facility]  [phone] [phone]) [phone]  ID: Erminio JONELLE Potters OB: 1941-06-29  MR#: [id number]  RDW#:[id number]  Patient Care Team: Bair, Kalpana, MD as PCP - General (Family Medicine) Perla Evalene PARAS, MD as PCP - Cardiology (Cardiology) Perla Evalene PARAS, MD as Consulting Physician (Cardiology) Marea Selinda RAMAN, MD as Referring Physician (Vascular Surgery) Jacobo Evalene PARAS, MD as Consulting Physician (Oncology)   CHIEF COMPLAINT: Osteoporosis.  INTERVAL HISTORY: Patient returns to clinic today for routine 37-month evaluation and continuation of Prolia .  She currently feels well and is asymptomatic. She has no neurologic complaints.  She denies any recent fevers or illnesses.  She has a good appetite and denies weight loss. She denies any chest pain, shortness of breath, cough, or hemoptysis.  She denies any nausea, vomiting, constipation, or diarrhea.  She has no urinary complaints.  Patient offers no specific complaints today.  REVIEW OF SYSTEMS:   Review of Systems  Constitutional: Negative.  Negative for diaphoresis, fever, malaise/fatigue and weight loss.  Respiratory: Negative.  Negative for cough and shortness of breath.   Cardiovascular: Negative.  Negative for chest pain and leg swelling.  Gastrointestinal: Negative.  Negative for abdominal pain and constipation.  Genitourinary: Negative.  Negative for dysuria.  Musculoskeletal: Negative.  Negative for back pain and joint pain.  Skin: Negative.  Negative for itching and rash.  Neurological: Negative.  Negative for sensory change, focal weakness and weakness.  Psychiatric/Behavioral: Negative.  The patient is not nervous/anxious.     As per HPI. Otherwise, a complete review of systems is negative.  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   CAD (coronary artery disease)    a. 10/2021 NSTEMI/PCI: LM 28m/d, LAD 55ost, 99/ (2.5x15 Onyx Frontier DES), D2 90, LCX 80p/m (2.5x30 Onyx Frontier DES), RCA 50ost, 60p, 49m (nl iFR throughout). LVEDP 20-34mmHg.   Cataract    Bil   Chicken pox    Chronic HFrEF (heart failure with reduced ejection fraction) (HCC)    a. 10/2021 Echo: EF 35-40%, mod asymm LVH w/ sev mid-apical ant, apical HK. GrI DD. Nl RV fxn.   Chronic kidney disease    stage 3   Colon polyp    Constipation    Hypercalcemia 10/24/2021   Hyperlipidemia    Hypothyroidism    Ischemic cardiomyopathy    a. 10/2021 Echo: EF 35-40%.   NSTEMI (non-ST elevated myocardial infarction) (HCC) 10/24/2021   Palpitations    over 20 yeras ago   Personal history of radiation therapy    Syncope 07/13/2023   Thoracic back pain 11/04/2021   Thyroid  disease    Wears hearing aid in both ears     PAST SURGICAL HISTORY: Past Surgical History:  Procedure Laterality Date   ABDOMINAL HYSTERECTOMY  1981   APPENDECTOMY  2012   BREAST BIOPSY Right 05/08/2017   Affirm Bx-DUCTAL CARCINOMA IN SITU (DCIS) WITH ONE FOCUS SUSPICIOUS FOR invasive   BREAST BIOPSY Right 05/17/2017   us  bx done at DR. Brynetts office, papillary carcinoma   BREAST BIOPSY Right 06/18/2019   Stereo bc calcs COMPATIBLE WITH PRIOR SURGICAL SITE CHANGES   BREAST LUMPECTOMY Right 06/07/2017   DUCTAL CARCINOMA IN SITU (DCIS) with microinvasion at  7:00 5 cmfn   BREAST LUMPECTOMY Right 06/07/2017   PAPILLARY LESION CONSISTENT WITH PAPILLARY CARCINOMA at retroaerolar 7:00 1.5 cmfn   BREAST LUMPECTOMY WITH SENTINEL LYMPH NODE BIOPSY Right 06/07/2017   Wide excision of 2 foci of high-grade DCIS, microinvasion noted only on core biopsy. 2 microscopic foci lateral inferior margins. Patient elected not to proceed to reexcision.   CATARACT EXTRACTION W/PHACO Right 03/27/2022   Procedure:  CATARACT EXTRACTION PHACO AND INTRAOCULAR LENS PLACEMENT (IOC) RIGHT VIVITY LENS;  Surgeon: Myrna Adine Anes, MD;  Location: [facility];  Service: Ophthalmology;  Laterality: Right;  5.49 00:39.1   CATARACT EXTRACTION W/PHACO Left 04/10/2022   Procedure: CATARACT EXTRACTION PHACO AND INTRAOCULAR LENS PLACEMENT (IOC) LEFT VIVITY LENS 3.92 00:32.4;  Surgeon: Myrna Adine Anes, MD;  Location: [facility];  Service: Ophthalmology;  Laterality: Left;   COLONOSCOPY     COLONOSCOPY W/ POLYPECTOMY     CORONARY PRESSURE/FFR STUDY N/A 10/25/2021   Procedure: INTRAVASCULAR PRESSURE WIRE/FFR STUDY;  Surgeon: Mady Bruckner, MD;  Location: [facility];  Service: Cardiovascular;  Laterality: N/A;   CORONARY STENT INTERVENTION N/A 10/25/2021   Procedure: CORONARY STENT INTERVENTION;  Surgeon: Mady Bruckner, MD;  Location: [facility];  Service: Cardiovascular;  Laterality: N/A;   CYSTOCELE REPAIR N/A 09/11/2019   Procedure: ANTERIOR COLPORRHAPHY;  Surgeon: Arloa Lamar SQUIBB, MD;  Location: [facility];  Service: Gynecology;  Laterality: N/A;   FINGER FRACTURE SURGERY Right    5th   HAMMER TOE SURGERY  09/20/2017   JOINT REPLACEMENT     right knee   LEFT HEART CATH AND CORONARY ANGIOGRAPHY N/A 10/25/2021   Procedure: LEFT HEART CATH AND CORONARY ANGIOGRAPHY;  Surgeon: Mady Bruckner, MD;  Location: [facility];  Service: Cardiovascular;  Laterality: N/A;   ROTATOR CUFF REPAIR Right    TONSILLECTOMY AND ADENOIDECTOMY  1963   TOTAL KNEE ARTHROPLASTY Right 10/17/2016   Procedure: TOTAL KNEE ARTHROPLASTY;  Surgeon: Maude Herald, MD;  Location: [facility];  Service: Orthopedics;  Laterality: Right;   VAGINAL DELIVERY     3    FAMILY HISTORY: Family History  Problem Relation Age of Onset   Arthritis Mother    Heart disease Mother    Hypertension Mother    Arthritis Father    Heart disease Father    Hypertension Father    Heart attack Father    Stroke Maternal  Grandmother    Cancer Maternal Aunt        abdominal?   Hyperlipidemia Maternal Aunt    Diabetes Paternal Aunt    Glaucoma Brother    Hyperlipidemia Other    Diabetes Other    Breast cancer Neg Hx     ADVANCED DIRECTIVES (Y/N):  N  HEALTH MAINTENANCE: Social History   Tobacco Use   Smoking status: Never   Smokeless tobacco: Never  Vaping Use   Vaping status: Never Used  Substance Use Topics   Alcohol use: Yes    Comment: wine occ   Drug use: No     Colonoscopy:  PAP:  Bone density:  Lipid panel:  Allergies  Allergen Reactions   Singulair [Montelukast] Itching   Lipitor [Atorvastatin ] Other (See Comments)    Myalgia, arthralgia   Simvastatin Other (See Comments)    myalgia    Current Outpatient Medications  Medication Sig Dispense Refill   acetaminophen  (TYLENOL ) 500 MG tablet Take 500 mg by mouth at bedtime. Takes 1- 2 tablets at bed time.  Biotin 5 MG TABS Take 10 mg by mouth daily.     calcium -vitamin D  (OSCAL WITH D) 500-200 MG-UNIT tablet Take 1 tablet by mouth daily.      carvedilol  (COREG ) 6.25 MG tablet Take 1 tablet (6.25 mg total) by mouth 2 (two) times daily. 180 tablet 3   clopidogrel  (PLAVIX ) 75 MG tablet Take 1 tablet (75 mg total) by mouth daily. 90 tablet 3   Creatine POWD by Does not apply route.     cyanocobalamin  1000 MCG tablet Take 1,000 mcg by mouth daily.     dapagliflozin  propanediol (FARXIGA ) 10 MG TABS tablet Take 1 tablet (10 mg total) by mouth daily before breakfast. 90 tablet 3   denosumab  (PROLIA ) 60 MG/ML SOSY injection Inject 60 mg into the skin every 6 (six) months.     Evolocumab  (REPATHA ) 140 MG/ML SOSY Inject 140 mg into the skin every 14 (fourteen) days. 6 mL 3   glucosamine-chondroitin 500-400 MG tablet Take 1 tablet by mouth 3 (three) times daily.     hydrochlorothiazide (HYDRODIURIL) 25 MG tablet Take 25 mg by mouth.     levocetirizine (XYZAL ) 5 MG tablet Take 1 tablet (5 mg total) by mouth every evening. 90 tablet 3    LEVOXYL  75 MCG tablet Take 1 tablet by mouth once daily 90 tablet 2   meloxicam (MOBIC) 7.5 MG tablet Take 7.5 mg by mouth daily.     Multiple Vitamin (MULTIVITAMIN WITH MINERALS) TABS tablet Take 1 tablet by mouth daily. Adult 50+     Omega-3 Fatty Acids (OMEGA-3 FISH OIL PO) Take by mouth daily.     polyethylene glycol powder (GLYCOLAX /MIRALAX ) powder Take 17 g by mouth daily. With coffee     sacubitril -valsartan  (ENTRESTO ) 24-26 MG Take 1 tablet by mouth 2 (two) times daily. 180 tablet 3   spironolactone  (ALDACTONE ) 25 MG tablet Take 25 mg by mouth daily.     vitamin B-12 (CYANOCOBALAMIN ) 1000 MCG tablet Take 1,000 mcg by mouth daily.     No current facility-administered medications for this visit.    OBJECTIVE: Vitals:   07/09/24 1038  BP: 118/62  Pulse: 69  Resp: 16  Temp: (!) 97 F (36.1 C)  SpO2: 99%     Body mass index is 29.88 kg/m.    ECOG FS:0 - Asymptomatic  General: Well-developed, well-nourished, no acute distress. Eyes: Pink conjunctiva, anicteric sclera. HEENT: Normocephalic, moist mucous membranes. Lungs: No audible wheezing or coughing. Heart: Regular rate and rhythm. Abdomen: Soft, nontender, no obvious distention. Musculoskeletal: No edema, cyanosis, or clubbing. Neuro: Alert, answering all questions appropriately. Cranial nerves grossly intact. Skin: No rashes or petechiae noted. Psych: Normal affect.  LAB RESULTS:  Lab Results  Component Value Date   NA 139 07/09/2024   K 4.2 07/09/2024   CL 103 07/09/2024   CO2 27 07/09/2024   GLUCOSE 87 07/09/2024   BUN 43 (H) 07/09/2024   CREATININE 1.63 (H) 07/09/2024   CALCIUM  10.5 (H) 07/09/2024   PROT 6.6 07/09/2024   ALBUMIN 4.0 07/09/2024   AST 24 07/09/2024   ALT 18 07/09/2024   ALKPHOS 35 (L) 07/09/2024   BILITOT 0.9 07/09/2024   GFRNONAA 31 (L) 07/09/2024   GFRAA 49 (L) 06/01/2017    Lab Results  Component Value Date   WBC 6.8 07/09/2024   NEUTROABS 3.5 07/09/2024   HGB 14.8 07/09/2024    HCT 45.5 07/09/2024   MCV 89.9 07/09/2024   PLT 111 (L) 07/09/2024     STUDIES: MM 3D  SCREENING MAMMOGRAM BILATERAL BREAST Result Date: 06/23/2024 CLINICAL DATA:  Screening. EXAM: DIGITAL SCREENING BILATERAL MAMMOGRAM WITH TOMOSYNTHESIS AND CAD TECHNIQUE: Bilateral screening digital craniocaudal and mediolateral oblique mammograms were obtained. Bilateral screening digital breast tomosynthesis was performed. The images were evaluated with computer-aided detection. COMPARISON:  Previous exam(s). ACR Breast Density Category b: There are scattered areas of fibroglandular density. FINDINGS: There are no findings suspicious for malignancy. IMPRESSION: No mammographic evidence of malignancy. A result letter of this screening mammogram will be mailed directly to the patient. RECOMMENDATION: Screening mammogram in one year. (Code:SM-B-01Y) BI-RADS CATEGORY  1: Negative. Electronically Signed   By: Corean Salter M.D.   On: 06/23/2024 14:16     ASSESSMENT: Osteoporosis.  PLAN:    Osteoporosis: Patient's most recent bone mineral density on June 19, 2023 reported T-score of -2.6 which is significantly improved than 1 year prior where her T-score supported -3.1.  Patient previously was on Fosamax  with no apparent benefit and was switched to Prolia .  Calcium  levels are adequate to proceed with treatment today.  Continue calcium  and vitamin D  supplementation.  Patient did not have her repeat bone mineral density in October 2025.  After discussion with the patient, it was agreed upon that no further follow-up is necessary and that she should continue getting her Prolia  injections with her primary care.  Her next injection should occur in May 2026.  No follow-up has been scheduled.   History of DCIS, right breast:  Patient underwent lumpectomy on June 07, 2017.  Final pathology was reviewed and no invasive component was noted, therefore patient did not require chemotherapy.  She completed adjuvant XRT in  January 2019.  Because of patient's persistent hot flashes, tamoxifen  was discontinued and patient was initiated on letrozole .  Patient completed 5 years of letrozole  in January 2024.  Her most recent mammogram on June 19, 2024 was reported BI-RADS 1.  Repeat in October 2026.  These can now be ordered by primary care. Renal insufficiency: Chronic and unchanged.  Patient's creatinine is 1.63 today. Hypercalcemia: Mild.  Prolia  as above. Thrombocytopenia: Chronic and unchanged.  Patient's platelet count is 111 today.  Her platelet count has ranged between 90 and 150 since February 2018.    Patient expressed understanding and was in agreement with this plan. She also understands that She can call clinic at any time with any questions, concerns, or complaints.    Cancer Staging  Ductal carcinoma in situ (DCIS) of right breast Staging form: Breast, AJCC 8th Edition - Clinical stage from 08/22/2017: Stage 0 (cTis (DCIS), cN0, cM0, ER+, PR-, HER2-) - Signed by Jacobo Evalene PARAS, MD on 08/22/2017 Laterality: Right   Evalene PARAS Jacobo, MD   07/10/2024 1:29 PM

## 2024-07-10 ENCOUNTER — Encounter: Payer: Self-pay | Admitting: Oncology

## 2024-07-10 ENCOUNTER — Ambulatory Visit

## 2024-07-15 ENCOUNTER — Ambulatory Visit

## 2024-07-15 ENCOUNTER — Other Ambulatory Visit: Payer: Self-pay

## 2024-07-15 DIAGNOSIS — N393 Stress incontinence (female) (male): Secondary | ICD-10-CM | POA: Diagnosis not present

## 2024-07-15 DIAGNOSIS — M6281 Muscle weakness (generalized): Secondary | ICD-10-CM

## 2024-07-15 DIAGNOSIS — R293 Abnormal posture: Secondary | ICD-10-CM

## 2024-07-15 DIAGNOSIS — R2689 Other abnormalities of gait and mobility: Secondary | ICD-10-CM

## 2024-07-15 NOTE — Therapy (Signed)
 OUTPATIENT PHYSICAL THERAPY FEMALE PELVIC TREATMENT   Patient Name: Gwendolyn King MRN: [id number] DOB:1941/01/06, 83 y.o., female Today's Date: 07/15/2024  END OF SESSION:  PT End of Session - 07/15/24 1534     Visit Number 7    Number of Visits 9    Date for Recertification  07/04/24    Authorization Type [employer]    Progress Note Due on Visit 10    PT Start Time 1531    PT Stop Time 1609    PT Time Calculation (min) 38 min    Activity Tolerance Patient tolerated treatment well    Behavior During Therapy WFL for tasks assessed/performed          Past Medical History:  Diagnosis Date   Allergy    hay fever, fall allergies   Arthritis    Asthma    as a child, dad was smoker   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   CAD (coronary artery disease)    a. 10/2021 NSTEMI/PCI: LM 6m/d, LAD 55ost, 99/ (2.5x15 Onyx Frontier DES), D2 90, LCX 80p/m (2.5x30 Onyx Frontier DES), RCA 50ost, 60p, 51m (nl iFR throughout). LVEDP 20-1mmHg.   Cataract    Bil   Chicken pox    Chronic HFrEF (heart failure with reduced ejection fraction) (HCC)    a. 10/2021 Echo: EF 35-40%, mod asymm LVH w/ sev mid-apical ant, apical HK. GrI DD. Nl RV fxn.   Chronic kidney disease    stage 3   Colon polyp    Constipation    Hypercalcemia 10/24/2021   Hyperlipidemia    Hypothyroidism    Ischemic cardiomyopathy    a. 10/2021 Echo: EF 35-40%.   NSTEMI (non-ST elevated myocardial infarction) (HCC) 10/24/2021   Palpitations    over 20 yeras ago   Personal history of radiation therapy    Syncope 07/13/2023   Thoracic back pain 11/04/2021   Thyroid  disease    Wears hearing aid in both ears    Past Surgical History:  Procedure Laterality Date   ABDOMINAL HYSTERECTOMY  1981   APPENDECTOMY  2012   BREAST BIOPSY Right 05/08/2017   Affirm Bx-DUCTAL CARCINOMA IN SITU (DCIS) WITH ONE FOCUS SUSPICIOUS FOR  invasive   BREAST BIOPSY Right 05/17/2017   us  bx done at DR. 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 2 microscopic foci lateral inferior margins. Patient elected not to proceed to reexcision.   CATARACT EXTRACTION W/PHACO Right 03/27/2022   Procedure: CATARACT EXTRACTION PHACO AND INTRAOCULAR LENS PLACEMENT (IOC) RIGHT VIVITY LENS;  Surgeon: Myrna Adine Anes, MD;  Location: [facility];  Service: Ophthalmology;  Laterality: Right;  5.49 00:39.1   CATARACT EXTRACTION W/PHACO Left 04/10/2022   Procedure: CATARACT EXTRACTION PHACO AND INTRAOCULAR LENS PLACEMENT (IOC) LEFT VIVITY LENS 3.92 00:32.4;  Surgeon: Myrna Adine Anes, MD;  Location: [facility];  Service: Ophthalmology;  Laterality: Left;   COLONOSCOPY     COLONOSCOPY W/ POLYPECTOMY  CORONARY PRESSURE/FFR STUDY N/A 10/25/2021   Procedure: INTRAVASCULAR PRESSURE WIRE/FFR STUDY;  Surgeon: Mady Bruckner, MD;  Location: [facility];  Service: Cardiovascular;  Laterality: N/A;   CORONARY STENT INTERVENTION N/A 10/25/2021   Procedure: CORONARY STENT INTERVENTION;  Surgeon: Mady Bruckner, MD;  Location: [facility];  Service: Cardiovascular;  Laterality: N/A;   CYSTOCELE REPAIR N/A 09/11/2019   Procedure: ANTERIOR COLPORRHAPHY;  Surgeon: Arloa Lamar SQUIBB, MD;  Location: [facility];  Service: Gynecology;  Laterality: N/A;   FINGER FRACTURE SURGERY Right    5th   HAMMER TOE SURGERY  09/20/2017   JOINT REPLACEMENT     right knee   LEFT  HEART CATH AND CORONARY ANGIOGRAPHY N/A 10/25/2021   Procedure: LEFT HEART CATH AND CORONARY ANGIOGRAPHY;  Surgeon: Mady Bruckner, MD;  Location: [facility];  Service: Cardiovascular;  Laterality: N/A;   ROTATOR CUFF REPAIR Right    TONSILLECTOMY AND ADENOIDECTOMY  1963   TOTAL KNEE ARTHROPLASTY Right 10/17/2016   Procedure: TOTAL KNEE ARTHROPLASTY;  Surgeon: Maude Herald, MD;  Location: [facility];  Service: Orthopedics;  Laterality: Right;   VAGINAL DELIVERY     3   Patient Active Problem List   Diagnosis Date Noted   Allergic rhinitis due to animal (cat) (dog) hair and dander 02/19/2024   Allergic rhinitis due to pollen 02/19/2024   Urge incontinence 02/19/2024   CAD S/P percutaneous coronary angioplasty 02/19/2024   Polyp of descending colon 02/19/2024   Carotid stenosis 07/04/2022   Right shoulder pain 03/08/2022   Sleeping difficulty 03/08/2022   Ischemic cardiomyopathy    Chronic low back pain 05/04/2021   Myalgia due to HMG CoA reductase inhibitor 11/01/2020   Thrombocytopenia 11/01/2020   Osteoporosis 06/15/2020   Venous insufficiency 05/03/2020   S/P hysterectomy 07/31/2018   CKD (chronic kidney disease) stage 3, GFR 30-59 ml/min (HCC) 05/22/2018   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 Luke Shade at [place]   REFERRING PROVIDER: Harland Birkenhead MD  REFERRING DIAG: SUI  THERAPY DIAG:  SUI (stress urinary incontinence, female)  Muscle weakness (generalized)  Other abnormalities of gait and mobility  Abnormal posture  Rationale for Evaluation and Treatment: Rehabilitation  ONSET DATE: 04/22/24 referral date but SUI started about a year ago  SUBJECTIVE:  SUBJECTIVE STATEMENT: Pt reported she's missed last week as she got the days wrong/mixed up. Pt went to the orthopedist and was told it was arthritis in R hip, they gave her anti-inflammatories to reduce pain. She wasn't able to walk without pain until she went to the orthopedist. She also used a cane as needed last week and some yesterday. She hasn't been performing her HEP 2/2 hip pain. She has performed clams a few times. She has been performing TrA activation and PFM contractions. When her hip hurt badly last week, she did leak twice 2/2 urgency. Unsure what caused hip pain. Pt continues to have home stressors, caring for her husband's dementia. Last night she slept seven hours without getting up and made it to the bathroom without leaking. Pt gets up once/night on average (6-7am in the morning).   EVAL: URINARY FUNCTION: SUI began about a year ago when she had a cold, she also has urgency and it continued. Pt reported she voids every few hours. Pt denied pain with voiding. Pt states stream of urine is strong most of the time. Pt gets up approx. 1x/night, but sometimes she'll get up 4-5x/night. Pt experiences leakage (usually a dribble but sometimes its enough to change underwear or pants), approx. 1-2/week. Does not wear pads or pantyliners. Pt had bladder surgery for prolapse.  BOWEL FUNCTION: a couple times a day. Pt has hemorrhoids and occasionally has pain with BM. 75% of the time a type 3 or 4. She does take something similar to Metamucil and it helps.  CORE STABILITY: hx of hysterectomy (vaginally), hx of bladder surgery for prolapse, and appendectomy. Pt has fallen on her tailbone a few times and was told she has scoliosis. Most recent fall was 11/2023 when she tripped over a box. Pt reported chronic LBP, at worst: 5-6/10 and massager tool helps, at best: 0/10 when seated.  SEXUAL FUNCTION: pt has not had intercourse in several years as pt's husband  has dementia and ED. Pt denied pain with past experiences during intercourse, tampon insertion or OBGYN exams. Pt had difficulty climaxing.   Fluid intake: a cup of coffee, water with flavoring (Mio), sweet tea (overall water: at least two, 20 oz. Cup), occ wine or soda  FUNCTIONAL LIMITATIONS: no  PERTINENT HISTORY:  Medications for current condition: none Surgeries: see above Other: HTN, venous insufficiency, carotid stenosis, CAD s/p percutaneous coronary angioplasty, , hypothyroidism, osteoporosis, CKD stage 3, polyps in colon, HLD, chronic LBP, ductal carcinoma in situ R breast s/p treatment (lumpectomy), urge incontinence, hysterectomy, NSTEMI, asthma, cataract B extraction, CVA in 2023, R TKA, R hammer toe, R shoulder surgery Sexual abuse: No   PAIN:  Are you having pain? No 07/15/24  NPRS scale: 0/10 Back pain: 5-6/10 but eases with sitting and massage.  PRECAUTIONS: None  RED FLAGS: None   WEIGHT BEARING RESTRICTIONS: No  FALLS:  Has patient fallen in last 6 months? No  OCCUPATION: retired  ACTIVITY LEVEL : foot cycle, elliptical bike (hasn't used it in awhile), volunteers at church  PLOF: Independent  PATIENT GOALS: To be able to go to the bathroom and not leak, and to strengthen muscles overall.    BOWEL MOVEMENT: Pain with bowel movement: Yes occ 2/2 hemorrhoids Type of bowel movement:Type (Bristol Stool Scale) types 3 and 4 with fiber supplement Fully empty rectum: Yes:   Leakage: No  Pads: No Fiber supplement/laxative Yes   URINATION: Pain with urination: No Fully empty bladder: Yes:                                  Post-void dribble: No Stream: Strong typically Urgency: Yes  Frequency:during the day every few hours.                                                         Nocturia: Yes: 1-5x/night   Leakage: Walking to the bathroom, Coughing, Sneezing, Laughing, and Lifting Pads/briefs: No but has to  change underwear at times  INTERCOURSE:  Ability to have vaginal penetration has not been sexually active in a few years 2/2 husband's health declining but no hx of pain. Climax: difficulty Marinoff Scale: 0/3 Lubricant:  PREGNANCY: Number of pregnancies:  Vaginal deliveries 2 Tearing Yes:   Episiotomy No C-section deliveries 0 Currently pregnant No  PROLAPSE: None had cystocele repaired    OBJECTIVE:  Note: Objective measures were completed at Evaluation unless otherwise noted.   COGNITION: Overall cognitive status: Within functional limits for tasks assessed     SENSATION: Light touch: Appears intact   FUNCTIONAL TESTS:   Single leg stance:  Rt: LOB and had to hold wall  Lt: LOB and had to hold wall  GAIT: Assistive device utilized: None Comments: decr. Stride length, decr. Trunk rotation, post. Pelvic tilt  POSTURE: forward head, decreased lumbar lordosis, increased thoracic kyphosis, decreased thoracic kyphosis, and posterior pelvic tilt T1-3 incr. Kyphosis, T4-12 decr. kyphosis   LUMBARAROM/PROM:   A/PROM A/PROM  Eval (% available)  Flexion WFL  Extension Limited approx. 25%  Right lateral flexion WFL with pain  Left lateral flexion WFL  Right rotation Limited by 50%  Left rotation Limited by 25%   (Blank rows = not tested)  LOWER EXTREMITY ROM: all WFL except for limited B hip IR  Active ROM Right eval Left eval  Hip flexion    Hip extension    Hip abduction    Hip adduction    Hip internal rotation    Hip external rotation    Knee flexion    Knee extension    Ankle dorsiflexion    Ankle plantarflexion    Ankle inversion    Ankle eversion     (Blank rows = not tested)  LOWER EXTREMITY MMT:  MMT Right eval Left eval  Hip flexion 4- 4-  Hip extension    Hip abduction 2 2  Hip adduction 2 2  Hip internal rotation 4- 4-  Hip external rotation 4- 4-  Knee flexion 4- 4-  Knee extension 5 5  Ankle dorsiflexion 4+ 4+  Ankle  plantarflexion    Ankle inversion    Ankle eversion     (Blank rows = not tested)    06/26/24: MMT Right eval Left eval  Hip flexion 4+ 4+  Hip extension    Hip abduction with slight resistance 3 3  Hip adduction 3+ 3+  Hip internal rotation 5 5  Hip external rotation 4+ 4+  Knee flexion 4+ 4+  Knee extension 5 5  Ankle dorsiflexion 5 5  Ankle plantarflexion    Ankle inversion    Ankle eversion     PALPATION:  General: no TTP of spine in standing 9/23: TTP over lower lumbar and sacrum, which pt reports is chronic, L convex tx spine curve (scoliosis hx), no TTP over hips/glutes or LEs. Limited B hip IR. Hx of osteoporosis so did not perform PAMs but tx flexion noted with FHP. No DR noted, incr. Infrasternal rib angle (L side)   PELVIC MMT:   MMT eval  Vaginal   Internal Anal Sphincter   External Anal Sphincter   Puborectalis   Diastasis Recti   (Blank rows = not tested)        TONE:   PROLAPSE:   TODAY'S TREATMENT:                                                                                                                              DATE: 07/15/24    Therex: progressed to therex Access Code: [email or web address] URL: [email or web address] Date: 07/15/2024 Prepared by: Delon Pinal  Exercises - Clamshell without resistance and REVERSE clams- 1 x daily - 3 x weekly - 3 sets - 10 reps - Supine Pelvic Floor Contraction  - 1 x daily - 7 x weekly - 1 sets - 10 reps with 10 sec.hold - Standing March with Counter Support  - 1 x daily - 3 x weekly - 3 sets - 10 reps - Standing Hip Extension with Counter Support  - 1 x daily - 3 x weekly - 3 sets - 10 reps, pain in R hip, glute squeezes on R side only, hip ext on L - Seated Pelvic Floor Contraction  - 1 x daily - 7 x weekly - 1 sets - 10 reps with 10 sec. hold Cues and demo for proper technique. S for safety. No pain reported at end of session.    SELF CARE:  PATIENT EDUCATION:  Education details: PT  educated pt how to perform and progress HEP without aggravating R hip pain and to notify MD if any changes occur. Person educated: Patient Education method: Explanation, Demonstration, and Handouts Education comprehension: verbalized understanding, returned demonstration, and needs further education  HOME EXERCISE PROGRAM: [phone]  ASSESSMENT:  CLINICAL IMPRESSION: Skilled session focused on modifying and progressing HEP to decr. R hip pain (orthopedist told pt it is arthritis related). Pt demonstrated progress as she was able to progress PFM contraction to seated and longer hold, indicating improved strength and endurance. The following impairments were continue to be noted: limited ROM, back pain, postural dysfunction, decr. Strength, impaired balance with hx of falls, nocturia, SUI. Pt would continue to benefit from skilled PT to improve safety and decr. Pain during all ADLs.   OBJECTIVE IMPAIRMENTS: Abnormal gait, decreased balance, decreased coordination, decreased endurance, difficulty walking, decreased ROM, decreased strength, hypomobility, increased fascial restrictions, impaired flexibility, postural dysfunction, and pain.   ACTIVITY LIMITATIONS: carrying, lifting, bending, standing, squatting, sleeping, transfers, continence, dressing, locomotion level, and caring for others  PARTICIPATION LIMITATIONS: meal prep, cleaning, laundry, interpersonal relationship, shopping, community activity, and  church  PERSONAL FACTORS: Age, Past/current experiences, and 3+ comorbidities: see above. are also affecting patient's functional outcome.   REHAB POTENTIAL: Good  CLINICAL DECISION MAKING: Stable/uncomplicated  EVALUATION COMPLEXITY: Low   GOALS: Goals reviewed with patient? Yes  SHORT TERM GOALS: Target date: for all STGs: 06/02/24. New STGs; 07/24/24  Pt will be IND in HEP to improve pain, strength, coordination. Baseline: no HEP; 10/13: not consistent Goal status: PARTIALLY MET    2.  Pt will demo proper toileting posture to fully empty bladder and reduce straining during bowel movement. Baseline:  unable to demo Goal status: MET  3.   Pt will improve hip and core strength to decr. Back pain to </=4/10 at worst when standing for >10 minutes. Baseline: 5-6/10 at worst; 10/13: 4-5/10 back pain with standing Goal status: PARTIALLY MET  4.  Pt will demonstrated improved relaxation and contraction of PFM with coordination of breath to reduce urinary leakage to </=once/week. Baseline: 1-2/week; 10/13: she hasn't leaked in the last two weeks.  Goal status: MET  5.  Finish exam and write goals as indicated. Baseline: limited by time constraints Goal status: MET   LONG TERM GOALS: Target date: for all LTGs: 06/30/24. New LTGs: 08/21/24  Pt will demonstrated improved relaxation and contraction of PFM with coordination of breath to reduce urinary leakage to zero/week. Baseline: 1-2/week; 10/13: she hasn't leaked in the last two weeks. 11/6: didn't totally make it one time in last month but she was at her husband's appt and couldn't go when she needed but only a dribble.  Goal status: PARTIALLY MET  2.  Pt will report walking and using elliptical and strength training or gym classes to maximize gains made in PT. Baseline: no HEP 11/6: can't leave husband who has dementia Goal status: DEFERRED  3.   Pt will improve hip and core strength to decr. Back pain to </=2/10 at worst when standing for >10 minutes. Baseline: 11/6: back pain at worst: 4-5/10 and uses massager when standing > 10-15 minutes  Goal status: PARTIALLY MET  4.   Pt will incr. Water intake to >/=64 oz. Per day and incr. Fiber intake to >/=25g to reduce constipation. Baseline: about 20 oz./day and metamucil; 11/6: 45-48 oz. And clear lax (does have fiber) Goal status: PARTIALLY MET (not constipated any more and incr. Water)   PLAN: check STGs, continue to add resistance to strengthening as tolerated,  internal assessment prn, manual therapy.    PT FREQUENCY: every other week  PT DURATION: 8 weeks  PLANNED INTERVENTIONS: 97164- PT Re-evaluation, 97110-Therapeutic exercises, 97530- Therapeutic activity, 97112- Neuromuscular re-education, 97535- Self Care, [id number]- Manual therapy, [phone]- Gait training, [phone] (1-2 muscles), [id number] (3+ muscles)- Dry Needling, Patient/Family education, Balance training, Taping, Joint mobilization, Spinal mobilization, Moist heat, and Biofeedback mobilization with caution 2/2 osteoporosis     Fruma Africa L, PT 07/15/2024, 4:14 PM  Delon Pinal, PT,DPT 07/15/24 4:14 PM Phone: [phone] Fax: [phone]

## 2024-07-22 ENCOUNTER — Ambulatory Visit

## 2024-08-05 ENCOUNTER — Other Ambulatory Visit: Payer: Self-pay

## 2024-08-05 ENCOUNTER — Ambulatory Visit: Attending: Obstetrics & Gynecology

## 2024-08-05 DIAGNOSIS — R293 Abnormal posture: Secondary | ICD-10-CM | POA: Insufficient documentation

## 2024-08-05 DIAGNOSIS — N393 Stress incontinence (female) (male): Secondary | ICD-10-CM | POA: Diagnosis present

## 2024-08-05 DIAGNOSIS — M6281 Muscle weakness (generalized): Secondary | ICD-10-CM | POA: Insufficient documentation

## 2024-08-05 DIAGNOSIS — R2689 Other abnormalities of gait and mobility: Secondary | ICD-10-CM

## 2024-08-05 NOTE — Therapy (Signed)
 OUTPATIENT PHYSICAL THERAPY FEMALE PELVIC TREATMENT   Patient Name: Gwendolyn King MRN: [id number] DOB:10-Nov-1940, 83 y.o., female Today's Date: 08/05/2024  END OF SESSION:  PT End of Session - 08/05/24 1459     Visit Number 8    Number of Visits 9    Date for Recertification  08/26/24    Authorization Type [employer]    Progress Note Due on Visit 10    PT Start Time 1455   pt late   PT Stop Time 1525    PT Time Calculation (min) 30 min    Activity Tolerance Patient tolerated treatment well    Behavior During Therapy WFL for tasks assessed/performed          Past Medical History:  Diagnosis Date   Allergy    hay fever, fall allergies   Arthritis    Asthma    as a child, dad was smoker   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   CAD (coronary artery disease)    a. 10/2021 NSTEMI/PCI: LM 34m/d, LAD 55ost, 99/ (2.5x15 Onyx Frontier DES), D2 90, LCX 80p/m (2.5x30 Onyx Frontier DES), RCA 50ost, 60p, 38m (nl iFR throughout). LVEDP 20-82mmHg.   Cataract    Bil   Chicken pox    Chronic HFrEF (heart failure with reduced ejection fraction) (HCC)    a. 10/2021 Echo: EF 35-40%, mod asymm LVH w/ sev mid-apical ant, apical HK. GrI DD. Nl RV fxn.   Chronic kidney disease    stage 3   Colon polyp    Constipation    Hypercalcemia 10/24/2021   Hyperlipidemia    Hypothyroidism    Ischemic cardiomyopathy    a. 10/2021 Echo: EF 35-40%.   NSTEMI (non-ST elevated myocardial infarction) (HCC) 10/24/2021   Palpitations    over 20 yeras ago   Personal history of radiation therapy    Syncope 07/13/2023   Thoracic back pain 11/04/2021   Thyroid  disease    Wears hearing aid in both ears    Past Surgical History:  Procedure Laterality Date   ABDOMINAL HYSTERECTOMY  1981   APPENDECTOMY  2012   BREAST BIOPSY Right 05/08/2017   Affirm Bx-DUCTAL CARCINOMA IN SITU (DCIS) WITH ONE FOCUS  SUSPICIOUS FOR invasive   BREAST BIOPSY Right 05/17/2017   us  bx done at DR. 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 2 microscopic foci lateral inferior margins. Patient elected not to proceed to reexcision.   CATARACT EXTRACTION W/PHACO Right 03/27/2022   Procedure: CATARACT EXTRACTION PHACO AND INTRAOCULAR LENS PLACEMENT (IOC) RIGHT VIVITY LENS;  Surgeon: Myrna Adine Anes, MD;  Location: [facility];  Service: Ophthalmology;  Laterality: Right;  5.49 00:39.1   CATARACT EXTRACTION W/PHACO Left 04/10/2022   Procedure: CATARACT EXTRACTION PHACO AND INTRAOCULAR LENS PLACEMENT (IOC) LEFT VIVITY LENS 3.92 00:32.4;  Surgeon: Myrna Adine Anes, MD;  Location: [facility];  Service: Ophthalmology;  Laterality: Left;   COLONOSCOPY     COLONOSCOPY W/  POLYPECTOMY     CORONARY PRESSURE/FFR STUDY N/A 10/25/2021   Procedure: INTRAVASCULAR PRESSURE WIRE/FFR STUDY;  Surgeon: Mady Bruckner, MD;  Location: [facility];  Service: Cardiovascular;  Laterality: N/A;   CORONARY STENT INTERVENTION N/A 10/25/2021   Procedure: CORONARY STENT INTERVENTION;  Surgeon: Mady Bruckner, MD;  Location: [facility];  Service: Cardiovascular;  Laterality: N/A;   CYSTOCELE REPAIR N/A 09/11/2019   Procedure: ANTERIOR COLPORRHAPHY;  Surgeon: Arloa Lamar SQUIBB, MD;  Location: [facility];  Service: Gynecology;  Laterality: N/A;   FINGER FRACTURE SURGERY Right    5th   HAMMER TOE SURGERY  09/20/2017   JOINT REPLACEMENT     right  knee   LEFT HEART CATH AND CORONARY ANGIOGRAPHY N/A 10/25/2021   Procedure: LEFT HEART CATH AND CORONARY ANGIOGRAPHY;  Surgeon: Mady Bruckner, MD;  Location: [facility];  Service: Cardiovascular;  Laterality: N/A;   ROTATOR CUFF REPAIR Right    TONSILLECTOMY AND ADENOIDECTOMY  1963   TOTAL KNEE ARTHROPLASTY Right 10/17/2016   Procedure: TOTAL KNEE ARTHROPLASTY;  Surgeon: Maude Herald, MD;  Location: [facility];  Service: Orthopedics;  Laterality: Right;   VAGINAL DELIVERY     3   Patient Active Problem List   Diagnosis Date Noted   Allergic rhinitis due to animal (cat) (dog) hair and dander 02/19/2024   Allergic rhinitis due to pollen 02/19/2024   Urge incontinence 02/19/2024   CAD S/P percutaneous coronary angioplasty 02/19/2024   Polyp of descending colon 02/19/2024   Carotid stenosis 07/04/2022   Right shoulder pain 03/08/2022   Sleeping difficulty 03/08/2022   Ischemic cardiomyopathy    Chronic low back pain 05/04/2021   Myalgia due to HMG CoA reductase inhibitor 11/01/2020   Thrombocytopenia 11/01/2020   Osteoporosis 06/15/2020   Venous insufficiency 05/03/2020   S/P hysterectomy 07/31/2018   CKD (chronic kidney disease) stage 3, GFR 30-59 ml/min (HCC) 05/22/2018   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 Luke Shade at [place]   REFERRING PROVIDER: Harland Birkenhead MD  REFERRING DIAG: SUI  THERAPY DIAG:  SUI (stress urinary incontinence, female)  Muscle weakness (generalized)  Other abnormalities of gait and mobility  Abnormal posture  Rationale for Evaluation and Treatment: Rehabilitation  ONSET DATE: 04/22/24 referral date but SUI started about a year ago  SUBJECTIVE:  SUBJECTIVE STATEMENT: Pt reported she was sick after Thanksgiving with chest congestion and coughing-no leakage. Pt did have one episode yesterday where she didn't grab her underwear with her pants to void and wet underwear. Pt feels approx. 80% improvement on GROC scale since starting PHPT. Not to have any leakage is the last 20%. She has made it to the bathroom in time at night when she gets up at night. She has an elliptical bike at home.    EVAL: URINARY FUNCTION: SUI began about a year ago when she had a cold, she also has urgency and it continued. Pt reported she voids every few hours. Pt denied pain with voiding. Pt states stream of urine is strong most of the time. Pt gets up approx. 1x/night, but sometimes she'll get up 4-5x/night. Pt experiences leakage (usually a dribble but sometimes its enough to change underwear or pants), approx. 1-2/week. Does not wear pads or pantyliners. Pt had bladder surgery for prolapse.  BOWEL FUNCTION: a couple times a day. Pt has hemorrhoids and occasionally has pain with BM. 75% of the time a type 3 or 4. She does take something similar to Metamucil and it helps.  CORE STABILITY: hx of hysterectomy (vaginally), hx of bladder surgery for prolapse, and appendectomy. Pt has fallen on her tailbone a few times and was told she has scoliosis. Most recent fall was 11/2023 when she tripped over a box. Pt reported chronic LBP, at worst: 5-6/10 and massager tool helps, at best: 0/10 when seated.  SEXUAL FUNCTION: pt has not had intercourse in several years as pt's husband has dementia and ED. Pt denied pain with past experiences during intercourse, tampon insertion or OBGYN exams. Pt had difficulty climaxing.   Fluid intake: a cup of coffee, water with flavoring (Mio), sweet tea (overall water: at least two, 20 oz. Cup), occ wine or soda  FUNCTIONAL LIMITATIONS: no  PERTINENT HISTORY:  Medications for current  condition: none Surgeries: see above Other: HTN, venous insufficiency, carotid stenosis, CAD s/p percutaneous coronary angioplasty, , hypothyroidism, osteoporosis, CKD stage 3, polyps in colon, HLD, chronic LBP, ductal carcinoma in situ R breast s/p treatment (lumpectomy), urge incontinence, hysterectomy, NSTEMI, asthma, cataract B extraction, CVA in 2023, R TKA, R hammer toe, R shoulder surgery Sexual abuse: No   PAIN:  Are you having pain? No 07/15/24  NPRS scale: 0/10 Back pain: 5-6/10 but eases with sitting and massage.  PRECAUTIONS: None  RED FLAGS: None   WEIGHT BEARING RESTRICTIONS: No  FALLS:  Has patient fallen in last 6 months? No  OCCUPATION: retired  ACTIVITY LEVEL : foot cycle, elliptical bike (hasn't used it in awhile), volunteers at church  PLOF: Independent  PATIENT GOALS: To be able to go to the bathroom and not leak, and to strengthen muscles overall.    BOWEL MOVEMENT: Pain with bowel movement: Yes occ 2/2 hemorrhoids Type of bowel movement:Type (Bristol Stool Scale) types 3 and 4 with fiber supplement Fully empty rectum: Yes:   Leakage: No                                                  Pads: No Fiber supplement/laxative Yes   URINATION: Pain with urination: No Fully empty bladder: Yes:  Post-void dribble: No Stream: Strong typically Urgency: Yes  Frequency:during the day every few hours.                                                         Nocturia: Yes: 1-5x/night   Leakage: Walking to the bathroom, Coughing, Sneezing, Laughing, and Lifting Pads/briefs: No but has to change underwear at times  INTERCOURSE:  Ability to have vaginal penetration has not been sexually active in a few years 2/2 husband's health declining but no hx of pain. Climax: difficulty Marinoff Scale: 0/3 Lubricant:  PREGNANCY: Number of pregnancies:  Vaginal deliveries 2 Tearing Yes:   Episiotomy No C-section deliveries 0 Currently  pregnant No  PROLAPSE: None had cystocele repaired    OBJECTIVE:  Note: Objective measures were completed at Evaluation unless otherwise noted.   COGNITION: Overall cognitive status: Within functional limits for tasks assessed     SENSATION: Light touch: Appears intact   FUNCTIONAL TESTS:   Single leg stance:  Rt: LOB and had to hold wall  Lt: LOB and had to hold wall  GAIT: Assistive device utilized: None Comments: decr. Stride length, decr. Trunk rotation, post. Pelvic tilt  POSTURE: forward head, decreased lumbar lordosis, increased thoracic kyphosis, decreased thoracic kyphosis, and posterior pelvic tilt T1-3 incr. Kyphosis, T4-12 decr. kyphosis   LUMBARAROM/PROM:   A/PROM A/PROM  Eval (% available)  Flexion WFL  Extension Limited approx. 25%  Right lateral flexion WFL with pain  Left lateral flexion WFL  Right rotation Limited by 50%  Left rotation Limited by 25%   (Blank rows = not tested)  LOWER EXTREMITY ROM: all WFL except for limited B hip IR  Active ROM Right eval Left eval  Hip flexion    Hip extension    Hip abduction    Hip adduction    Hip internal rotation    Hip external rotation    Knee flexion    Knee extension    Ankle dorsiflexion    Ankle plantarflexion    Ankle inversion    Ankle eversion     (Blank rows = not tested)  LOWER EXTREMITY MMT:  MMT Right eval Left eval  Hip flexion 4- 4-  Hip extension    Hip abduction 2 2  Hip adduction 2 2  Hip internal rotation 4- 4-  Hip external rotation 4- 4-  Knee flexion 4- 4-  Knee extension 5 5  Ankle dorsiflexion 4+ 4+  Ankle plantarflexion    Ankle inversion    Ankle eversion     (Blank rows = not tested)    06/26/24: MMT Right eval Left eval  Hip flexion 4+ 4+  Hip extension    Hip abduction with slight resistance 3 3  Hip adduction 3+ 3+  Hip internal rotation 5 5  Hip external rotation 4+ 4+  Knee flexion 4+ 4+  Knee extension 5 5  Ankle dorsiflexion 5 5   Ankle plantarflexion    Ankle inversion    Ankle eversion     PALPATION:  General: no TTP of spine in standing 9/23: TTP over lower lumbar and sacrum, which pt reports is chronic, L convex tx spine curve (scoliosis hx), no TTP over hips/glutes or LEs. Limited B hip IR. Hx of osteoporosis so did not perform PAMs but tx flexion noted with  FHP. No DR noted, incr. Infrasternal rib angle (L side)   PELVIC MMT:   MMT eval  Vaginal   Internal Anal Sphincter   External Anal Sphincter   Puborectalis   Diastasis Recti   (Blank rows = not tested)        TONE:   PROLAPSE:   TODAY'S TREATMENT:                                                                                                                              DATE: 08/05/24    Therex:  Access Code: [email or web address] URL: [email or web address] Date: 08/05/2024 Prepared by: Delon Pinal  Exercises - Clamshell with Resistance  - 1 x daily - 3 x weekly - 3 sets - 10 reps a few reps - Supine Bridge with Mini Swiss Ball Between Knees  - 1 x daily - 3 x weekly - 3 sets - 10 reps a few reps - seated Cervical Retraction with Towel  - 1 x daily - 7 x weekly - 1 sets - 5 reps - 5 hold  - Standing March with Counter Support  - 1 x daily - 3 x weekly - 3 sets - 10 reps  - Standing Hip Extension with Counter Support  - 1 x daily - 3 x weekly - 3 sets - 10 reps cues to take a rest vs. Swinging foot ant/post - Seated Pelvic Floor Contraction  - 1-2 x daily - 7 x weekly - 1 sets - 10 reps and with long hold - Standing Knee Flexion AROM with Chair Support  - 1 x daily - 3 x weekly - 3 sets - 10 reps cues to not flex hip but to focus on knee flex Cues and demo for proper technique. S for safety. No pain reported at end of session.    SELF CARE:  PATIENT EDUCATION:  Education details: PT educated pt on the importance of consistently performing HEP. Discussed next appt potentially being d/c visit. Person educated: Patient Education  method: Explanation, Demonstration, and Handouts Education comprehension: verbalized understanding, returned demonstration, and needs further education  HOME EXERCISE PROGRAM: [phone]  ASSESSMENT:  CLINICAL IMPRESSION: Skilled session focused on progressing HEP and ensuring pt performing correctly. Pt continues to require minimal cues and progressed to performing IND. Demonstrated progress as she reported 80% on GROC scale since starting PHPT. The following impairments were continue to be noted: limited ROM, back pain, postural dysfunction, decr. Strength, impaired balance with hx of falls, nocturia, SUI. Pt would continue to benefit from skilled PT to improve safety and decr. Pain during all ADLs.   OBJECTIVE IMPAIRMENTS: Abnormal gait, decreased balance, decreased coordination, decreased endurance, difficulty walking, decreased ROM, decreased strength, hypomobility, increased fascial restrictions, impaired flexibility, postural dysfunction, and pain.   ACTIVITY LIMITATIONS: carrying, lifting, bending, standing, squatting, sleeping, transfers, continence, dressing, locomotion level, and caring for others  PARTICIPATION LIMITATIONS: meal prep, cleaning, laundry, interpersonal relationship, shopping, community activity, and church  PERSONAL FACTORS: Age, Past/current experiences, and 3+ comorbidities: see above. are also affecting patient's functional outcome.   REHAB POTENTIAL: Good  CLINICAL DECISION MAKING: Stable/uncomplicated  EVALUATION COMPLEXITY: Low   GOALS: Goals reviewed with patient? Yes  SHORT TERM GOALS: Target date: for all STGs: 06/02/24. New STGs; 07/24/24  Pt will be IND in HEP to improve pain, strength, coordination. Baseline: no HEP; 10/13: not consistent; 12/16: was not consistent while she sick over Thanksgiving but still performed PFM contractions; before she was sick: every other day. Goal status: PARTIALLY MET   2.  Pt will demo proper toileting posture to  fully empty bladder and reduce straining during bowel movement. Baseline:  unable to demo Goal status: MET  3.   Pt will improve hip and core strength to decr. Back pain to </=4/10 at worst when standing for >10 minutes. Baseline: 5-6/10 at worst; 10/13: 4-5/10 back pain with standing 12/16: 3/10 at worst but stretches help to reduce intensity Goal status: MET  4.  Pt will demonstrated improved relaxation and contraction of PFM with coordination of breath to reduce urinary leakage to </=once/week. Baseline: 1-2/week; 10/13: she hasn't leaked in the last two weeks.  Goal status: MET  5.  Finish exam and write goals as indicated. Baseline: limited by time constraints Goal status: MET   LONG TERM GOALS: Target date: for all LTGs: 06/30/24. New LTGs: 08/21/24  Pt will demonstrated improved relaxation and contraction of PFM with coordination of breath to reduce urinary leakage to zero/week. Baseline: 1-2/week; 10/13: she hasn't leaked in the last two weeks. 11/6: didn't totally make it one time in last month but she was at her husband's appt and couldn't go when she needed but only a dribble.  Goal status: PARTIALLY MET  2.  Pt will report walking and using elliptical and strength training or gym classes to maximize gains made in PT. Baseline: no HEP 11/6: can't leave husband who has dementia Goal status: DEFERRED  3.   Pt will improve hip and core strength to decr. Back pain to </=2/10 at worst when standing for >10 minutes. Baseline: 11/6: back pain at worst: 4-5/10 and uses massager when standing > 10-15 minutes  Goal status: PARTIALLY MET  4.   Pt will incr. Water intake to >/=64 oz. Per day and incr. Fiber intake to >/=25g to reduce constipation. Baseline: about 20 oz./day and metamucil; 11/6: 45-48 oz. And clear lax (does have fiber) Goal status: PARTIALLY MET (not constipated any more and incr. Water)   PLAN: d/c? continue to add resistance to strengthening as tolerated, internal  assessment prn, manual therapy.    PT FREQUENCY: every other week  PT DURATION: 8 weeks  PLANNED INTERVENTIONS: 97164- PT Re-evaluation, 97110-Therapeutic exercises, 97530- Therapeutic activity, 97112- Neuromuscular re-education, 97535- Self Care, 02859- Manual therapy, [phone]- Gait training, [phone] (1-2 muscles), [phone] (3+ muscles)- Dry Needling, Patient/Family education, Balance training, Taping, Joint mobilization, Spinal mobilization, Moist heat, and Biofeedback mobilization with caution 2/2 osteoporosis     Kayleen Alig L, PT 08/05/2024, 3:01 PM  Delon Pinal, PT,DPT 08/05/2024 3:01 PM Phone: [phone] Fax: [phone]

## 2024-08-12 ENCOUNTER — Encounter

## 2024-08-19 ENCOUNTER — Ambulatory Visit

## 2024-08-26 ENCOUNTER — Ambulatory Visit

## 2024-09-04 ENCOUNTER — Other Ambulatory Visit: Payer: Self-pay

## 2024-09-05 NOTE — Telephone Encounter (Signed)
 In accordance with refill protocols, please review and address the following requirements before this medication refill can be authorized:  Labs  Labs requested to be within a normal range of 180 days cr level and 365 days with PLT level

## 2024-09-06 ENCOUNTER — Encounter: Payer: Self-pay | Admitting: Oncology

## 2024-09-08 ENCOUNTER — Ambulatory Visit: Payer: PPO | Admitting: *Deleted

## 2024-09-08 ENCOUNTER — Telehealth: Payer: Self-pay | Admitting: *Deleted

## 2024-09-08 ENCOUNTER — Other Ambulatory Visit (HOSPITAL_COMMUNITY): Payer: Self-pay

## 2024-09-08 ENCOUNTER — Telehealth: Payer: Self-pay | Admitting: Pharmacy Technician

## 2024-09-08 ENCOUNTER — Encounter: Payer: Self-pay | Admitting: Oncology

## 2024-09-08 VITALS — Ht 60.25 in | Wt 153.0 lb

## 2024-09-08 DIAGNOSIS — Z0000 Encounter for general adult medical examination without abnormal findings: Secondary | ICD-10-CM

## 2024-09-08 DIAGNOSIS — J301 Allergic rhinitis due to pollen: Secondary | ICD-10-CM

## 2024-09-08 DIAGNOSIS — E039 Hypothyroidism, unspecified: Secondary | ICD-10-CM

## 2024-09-08 DIAGNOSIS — I2510 Atherosclerotic heart disease of native coronary artery without angina pectoris: Secondary | ICD-10-CM

## 2024-09-08 DIAGNOSIS — E782 Mixed hyperlipidemia: Secondary | ICD-10-CM

## 2024-09-08 MED ORDER — REPATHA 140 MG/ML ~~LOC~~ SOSY: 140.0000 mg | PREFILLED_SYRINGE | SUBCUTANEOUS | 0 refills | Status: DC

## 2024-09-08 MED ORDER — LEVOXYL 75 MCG PO TABS: ORAL_TABLET | ORAL | 0 refills | Status: AC

## 2024-09-08 MED ORDER — LEVOCETIRIZINE DIHYDROCHLORIDE 5 MG PO TABS: 5.0000 mg | ORAL_TABLET | Freq: Every evening | ORAL | 3 refills | Status: AC

## 2024-09-08 NOTE — Telephone Encounter (Signed)
 Patient is calling requesting refills. Patient made an appointment for 11/03/24.   *STAT* If patient is at the pharmacy, call can be transferred to refill team.   1. Which medications need to be refilled? (please list name of each medication and dose if known)   clopidogrel  (PLAVIX ) 75 MG tablet    carvedilol  (COREG ) 6.25 MG tablet    sacubitril -valsartan  (ENTRESTO ) 24-26 MG    spironolactone  (ALDACTONE ) 25 MG tablet    2. Would you like to learn more about the convenience, safety, & potential cost savings by using the [facility]?    3. Are you open to using the [facility] (Type [facility].   4. Which pharmacy/location (including street and city if local pharmacy) is medication to be sent to? [facility] - [place], [place] - [address]    5. Do they need a 30 day or 90 day supply? 90

## 2024-09-08 NOTE — Telephone Encounter (Signed)
 Please reach out to the patient that I sent refill on Repatha , Xyzal , Levoxyl  for 90 days to [facility].    Recommend follow-up visit in 2 to 4 weeks for chronic disease follow-up.  Offer acute visit if patient would like to be seen sooner for recent history of fall.  Luke Shade, MD

## 2024-09-08 NOTE — Telephone Encounter (Addendum)
 Performed AWV  Patient stated she fell out of bed 09/01/24 trying to reach the phone that had fallen to the floor. Patient stated that she hit her head and has a knot on her head that is improving. Patient stated that she hit her face and has two black eyes which is getting better. Patient stated that she is doing okay. Patient stated that she is getting better and declines a visit for the fall.  Patient stated that she saw where she no showed for a visit with you 08/26/24 but did not get a reminder call that she had that visit. Patient stated that she thinks she is due to see you soon but not sure when and no appointments available soon. Patient stated that she may need to see you for the refills that she needs.  Patient stated that she has changed her Insurance to [facility] and needs some refills. Patient requested that Repatha ,  Levoxyl , xyzal  and Farxiga  be sent to her new pharmacy. Pharmacy [facility].

## 2024-09-08 NOTE — Telephone Encounter (Signed)
" ° °  Pharmacy Patient Advocate Encounter   Received notification from [facility] that prior authorization for repatha  is required/requested.   Insurance verification completed.   The patient is insured through [employer].   Per pa: [id number]   "

## 2024-09-08 NOTE — Patient Instructions (Signed)
 Gwendolyn King,  Thank you for taking the time for your Medicare Wellness Visit. I appreciate your continued commitment to your health goals. Please review the care plan we discussed, and feel free to reach out if I can assist you further.  Please note that Annual Wellness Visits do not include a physical exam. Some assessments may be limited, especially if the visit was conducted virtually. If needed, we may recommend an in-person follow-up with your provider.  Ongoing Care Seeing your primary care provider every 3 to 6 months helps us  monitor your health and provide consistent, personalized care.  Remember to update your covid vaccine.  Referrals If a referral was made during today's visit and you haven't received any updates within two weeks, please contact the referred provider directly to check on the status.  Recommended Screenings:  Health Maintenance  Topic Date Due   Colon Cancer Screening  03/21/2023   COVID-19 Vaccine (9 - 2025-26 season) 04/21/2024   Breast Cancer Screening  06/19/2025   Medicare Annual Wellness Visit  09/08/2025   Pneumococcal Vaccine for age over 66  Completed   Flu Shot  Completed   Osteoporosis screening with Bone Density Scan  Completed   Zoster (Shingles) Vaccine  Completed   Meningitis B Vaccine  Aged Out   DTaP/Tdap/Td vaccine  Discontinued       09/08/2024    2:38 PM  Advanced Directives  Does Patient Have a Medical Advance Directive? Yes  Type of Estate Agent of [place];Living will  Does patient want to make changes to medical advance directive? No - Patient declined  Copy of Healthcare Power of Attorney in Chart? Yes - validated most recent copy scanned in chart (See row information)    Vision: Annual vision screenings are recommended for early detection of glaucoma, cataracts, and diabetic retinopathy. These exams can also reveal signs of chronic conditions such as diabetes and high blood pressure.  Dental: Annual  dental screenings help detect early signs of oral cancer, gum disease, and other conditions linked to overall health, including heart disease and diabetes.  Please see the attached documents for additional preventive care recommendations.

## 2024-09-08 NOTE — Progress Notes (Signed)
 "  Chief Complaint  Patient presents with   Medicare Wellness     Subjective:   Gwendolyn King is a 84 y.o. female who presents for a Medicare Annual Wellness Visit.  Visit info / Clinical Intake: Medicare Wellness Visit Type:: Subsequent Annual Wellness Visit Persons participating in visit and providing information:: patient Medicare Wellness Visit Mode:: Telephone If telephone:: video declined Since this visit was completed virtually, some vitals may be partially provided or unavailable. Missing vitals are due to the limitations of the virtual format.: Unable to obtain vitals - no equipment If Telephone or Video please confirm:: I connected with patient using audio/video enable telemedicine. I verified patient identity with two identifiers, discussed telehealth limitations, and patient agreed to proceed. Patient Location:: Home Provider Location:: Office/Home Interpreter Needed?: No Pre-visit prep was completed: yes AWV questionnaire completed by patient prior to visit?: no Living arrangements:: lives with spouse/significant other Patient's Overall Health Status Rating: good Typical amount of pain: none Does pain affect daily life?: no Are you currently prescribed opioids?: no  Dietary Habits and Nutritional Risks How many meals a day?: 3 Eats fruit and vegetables daily?: yes Most meals are obtained by: preparing own meals In the last 2 weeks, have you had any of the following?: none Diabetic:: no  Functional Status Activities of Daily Living (to include ambulation/medication): Independent Ambulation: Independent Medication Administration: Independent Home Management (perform basic housework or laundry): Independent Manage your own finances?: yes Primary transportation is: driving Concerns about vision?: no *vision screening is required for WTM* Concerns about hearing?: (!) yes Uses hearing aids?: (!) yes Hear whispered voice?: -- (televisit)  Fall Screening Falls in  the past year?: 1 Number of falls in past year: 0 Was there an injury with Fall?: 1 (did not go to the doctor) Fall Risk Category Calculator: 2 Patient Fall Risk Level: Moderate Fall Risk  Fall Risk Patient at Risk for Falls Due to: History of fall(s); Other (Comment) (fell out of bed trying to get the phone that was on the floor) Fall risk Follow up: Falls evaluation completed; Falls prevention discussed; Education provided  Home and Transportation Safety: All rugs have non-skid backing?: yes All stairs or steps have railings?: yes Grab bars in the bathtub or shower?: yes Have non-skid surface in bathtub or shower?: (!) no Good home lighting?: yes Regular seat belt use?: yes Hospital stays in the last year:: no  Cognitive Assessment Difficulty concentrating, remembering, or making decisions? : no Will 6CIT or Mini Cog be Completed: yes What year is it?: 0 points What month is it?: 0 points Give patient an address phrase to remember (5 components): [address] [place] About what time is it?: 0 points Count backwards from 20 to 1: 0 points Say the months of the year in reverse: 0 points Repeat the address phrase from earlier: 0 points 6 CIT Score: 0 points  Advance Directives (For Healthcare) Does Patient Have a Medical Advance Directive?: Yes Does patient want to make changes to medical advance directive?: No - Patient declined Type of Advance Directive: Healthcare Power of [place]; Living will Copy of Healthcare Power of Attorney in Chart?: Yes - validated most recent copy scanned in chart (See row information) Copy of Living Will in Chart?: Yes - validated most recent copy scanned in chart (See row information)  Reviewed/Updated  Reviewed/Updated: Reviewed All (Medical, Surgical, Family, Medications, Allergies, Care Teams, Patient Goals)    Allergies (verified) Singulair [montelukast], Lipitor [atorvastatin ], and Simvastatin   Current Medications  (  verified) Outpatient Encounter Medications as of 09/08/2024  Medication Sig   acetaminophen  (TYLENOL ) 500 MG tablet Take 500 mg by mouth at bedtime. Takes 1- 2 tablets at bed time.   Biotin 5 MG TABS Take 10 mg by mouth daily.   calcium -vitamin D  (OSCAL WITH D) 500-200 MG-UNIT tablet Take 1 tablet by mouth daily.    carvedilol  (COREG ) 6.25 MG tablet Take 1 tablet (6.25 mg total) by mouth 2 (two) times daily.   clopidogrel  (PLAVIX ) 75 MG tablet Take 1 tablet (75 mg total) by mouth daily.   Creatine POWD by Does not apply route. (Patient taking differently: by Does not apply route. Every other day)   cyanocobalamin  1000 MCG tablet Take 1,000 mcg by mouth daily.   dapagliflozin  propanediol (FARXIGA ) 10 MG TABS tablet Take 1 tablet (10 mg total) by mouth daily before breakfast.   denosumab  (PROLIA ) 60 MG/ML SOSY injection Inject 60 mg into the skin every 6 (six) months.   Evolocumab  (REPATHA ) 140 MG/ML SOSY Inject 140 mg into the skin every 14 (fourteen) days.   levocetirizine (XYZAL ) 5 MG tablet Take 1 tablet (5 mg total) by mouth every evening.   LEVOXYL  75 MCG tablet Take 1 tablet by mouth once daily   Multiple Vitamin (MULTIVITAMIN WITH MINERALS) TABS tablet Take 1 tablet by mouth daily. Adult 50+   Omega-3 Fatty Acids (OMEGA-3 FISH OIL PO) Take by mouth daily.   polyethylene glycol powder (GLYCOLAX /MIRALAX ) powder Take 17 g by mouth daily. With coffee (Patient taking differently: Take 17 g by mouth daily as needed. With coffee)   sacubitril -valsartan  (ENTRESTO ) 24-26 MG Take 1 tablet by mouth 2 (two) times daily.   spironolactone  (ALDACTONE ) 25 MG tablet Take 25 mg by mouth daily.   vitamin B-12 (CYANOCOBALAMIN ) 1000 MCG tablet Take 1,000 mcg by mouth daily.   vitamin D3 (CHOLECALCIFEROL) 25 MCG tablet Take 1,000 Units by mouth daily.   glucosamine-chondroitin 500-400 MG tablet Take 1 tablet by mouth 3 (three) times daily. (Patient not taking: Reported on 09/08/2024)   hydrochlorothiazide  (HYDRODIURIL) 25 MG tablet Take 25 mg by mouth. (Patient not taking: Reported on 09/08/2024)   meloxicam (MOBIC) 7.5 MG tablet Take 7.5 mg by mouth daily. (Patient not taking: Reported on 09/08/2024)   No facility-administered encounter medications on file as of 09/08/2024.    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   CAD (coronary artery disease)    a. 10/2021 NSTEMI/PCI: LM 73m/d, LAD 55ost, 99/ (2.5x15 Onyx Frontier DES), D2 90, LCX 80p/m (2.5x30 Onyx Frontier DES), RCA 50ost, 60p, 80m (nl iFR throughout). LVEDP 20-53mmHg.   Cataract    Bil   Chicken pox    Chronic HFrEF (heart failure with reduced ejection fraction) (HCC)    a. 10/2021 Echo: EF 35-40%, mod asymm LVH w/ sev mid-apical ant, apical HK. GrI DD. Nl RV fxn.   Chronic kidney disease    stage 3   Colon polyp    Constipation    Hypercalcemia 10/24/2021   Hyperlipidemia    Hypothyroidism    Ischemic cardiomyopathy    a. 10/2021 Echo: EF 35-40%.   NSTEMI (non-ST elevated myocardial infarction) (HCC) 10/24/2021   Palpitations    over 20 yeras ago  Personal history of radiation therapy    Syncope 07/13/2023   Thoracic back pain 11/04/2021   Thyroid  disease    Wears hearing aid in both ears    Past Surgical History:  Procedure Laterality Date   ABDOMINAL HYSTERECTOMY  1981   APPENDECTOMY  2012   BREAST BIOPSY Right 05/08/2017   Affirm Bx-DUCTAL CARCINOMA IN SITU (DCIS) WITH ONE FOCUS SUSPICIOUS FOR invasive   BREAST BIOPSY Right 05/17/2017   us  bx done at DR. Brynetts office, papillary carcinoma   BREAST BIOPSY Right 06/18/2019   Stereo bc calcs COMPATIBLE WITH PRIOR SURGICAL SITE CHANGES   BREAST LUMPECTOMY Right 06/07/2017   DUCTAL CARCINOMA IN SITU (DCIS) with microinvasion at 7:00 5  cmfn   BREAST LUMPECTOMY Right 06/07/2017   PAPILLARY LESION CONSISTENT WITH PAPILLARY CARCINOMA at retroaerolar 7:00 1.5 cmfn   BREAST LUMPECTOMY WITH SENTINEL LYMPH NODE BIOPSY Right 06/07/2017   Wide excision of 2 foci of high-grade DCIS, microinvasion noted only on core biopsy. 2 microscopic foci lateral inferior margins. Patient elected not to proceed to reexcision.   CATARACT EXTRACTION W/PHACO Right 03/27/2022   Procedure: CATARACT EXTRACTION PHACO AND INTRAOCULAR LENS PLACEMENT (IOC) RIGHT VIVITY LENS;  Surgeon: Myrna Adine Anes, MD;  Location: [facility];  Service: Ophthalmology;  Laterality: Right;  5.49 00:39.1   CATARACT EXTRACTION W/PHACO Left 04/10/2022   Procedure: CATARACT EXTRACTION PHACO AND INTRAOCULAR LENS PLACEMENT (IOC) LEFT VIVITY LENS 3.92 00:32.4;  Surgeon: Myrna Adine Anes, MD;  Location: [facility];  Service: Ophthalmology;  Laterality: Left;   COLONOSCOPY     COLONOSCOPY W/ POLYPECTOMY     CORONARY PRESSURE/FFR STUDY N/A 10/25/2021   Procedure: INTRAVASCULAR PRESSURE WIRE/FFR STUDY;  Surgeon: Mady Bruckner, MD;  Location: [facility];  Service: Cardiovascular;  Laterality: N/A;   CORONARY STENT INTERVENTION N/A 10/25/2021   Procedure: CORONARY STENT INTERVENTION;  Surgeon: Mady Bruckner, MD;  Location: [facility];  Service: Cardiovascular;  Laterality: N/A;   CYSTOCELE REPAIR N/A 09/11/2019   Procedure: ANTERIOR COLPORRHAPHY;  Surgeon: Arloa Lamar SQUIBB, MD;  Location: [facility];  Service: Gynecology;  Laterality: N/A;   FINGER FRACTURE SURGERY Right    5th   HAMMER TOE SURGERY  09/20/2017   JOINT REPLACEMENT     right knee   LEFT HEART CATH AND CORONARY ANGIOGRAPHY N/A 10/25/2021   Procedure: LEFT HEART CATH AND CORONARY ANGIOGRAPHY;  Surgeon: Mady Bruckner, MD;  Location: [facility];  Service: Cardiovascular;  Laterality: N/A;   ROTATOR CUFF REPAIR Right    TONSILLECTOMY AND ADENOIDECTOMY  1963   TOTAL KNEE  ARTHROPLASTY Right 10/17/2016   Procedure: TOTAL KNEE ARTHROPLASTY;  Surgeon: Maude Herald, MD;  Location: [facility];  Service: Orthopedics;  Laterality: Right;   VAGINAL DELIVERY     3   Family History  Problem Relation Age of Onset   Arthritis Mother    Heart disease Mother    Hypertension Mother    Arthritis Father    Heart disease Father    Hypertension Father    Heart attack Father    Stroke Maternal Grandmother    Cancer Maternal Aunt        abdominal?   Hyperlipidemia Maternal Aunt    Diabetes Paternal Aunt    Glaucoma Brother    Hyperlipidemia Other    Diabetes Other    Breast cancer Neg Hx    Social History   Occupational History   Not on file  Tobacco Use   Smoking status:  Never   Smokeless tobacco: Never  Vaping Use   Vaping status: Never Used  Substance and Sexual Activity   Alcohol use: Yes    Comment: wine occ   Drug use: No   Sexual activity: Not Currently    Birth control/protection: Surgical   Tobacco Counseling Counseling given: Not Answered  SDOH Screenings   Food Insecurity: No Food Insecurity (09/08/2024)  Housing: Low Risk (09/08/2024)  Transportation Needs: No Transportation Needs (09/08/2024)  Utilities: Not At Risk (09/05/2023)  Alcohol Screen: Low Risk (09/08/2024)  Depression (PHQ2-9): Low Risk (09/08/2024)  Financial Resource Strain: Low Risk (09/08/2024)  Physical Activity: Insufficiently Active (09/08/2024)  Social Connections: Socially Integrated (09/08/2024)  Stress: No Stress Concern Present (09/08/2024)  Tobacco Use: Low Risk (09/08/2024)  Health Literacy: Adequate Health Literacy (09/08/2024)   See flowsheets for full screening details  Depression Screen PHQ 2 & 9 Depression Scale- Over the past 2 weeks, how often have you been bothered by any of the following problems? Little interest or pleasure in doing things: 0 Feeling down, depressed, or hopeless (PHQ Adolescent also includes...irritable): 0 PHQ-2 Total Score: 0 Trouble  falling or staying asleep, or sleeping too much: 0 Feeling tired or having little energy: 1 Poor appetite or overeating (PHQ Adolescent also includes...weight loss): 0 Feeling bad about yourself - or that you are a failure or have let yourself or your family down: 0 Trouble concentrating on things, such as reading the newspaper or watching television (PHQ Adolescent also includes...like school work): 0 Moving or speaking so slowly that other people could have noticed. Or the opposite - being so fidgety or restless that you have been moving around a lot more than usual: 0 Thoughts that you would be better off dead, or of hurting yourself in some way: 0 PHQ-9 Total Score: 1 If you checked off any problems, how difficult have these problems made it for you to do your work, take care of things at home, or get along with other people?: Not difficult at all  Depression Treatment Depression Interventions/Treatment : Counseling     Goals Addressed             This Visit's Progress    Patient Stated       Wants to get stronger             Objective:    Today's Vitals   09/08/24 1429  Weight: 153 lb (69.4 kg)  Height: 5' 0.25 (1.53 m)   Body mass index is 29.63 kg/m.  Hearing/Vision screen Hearing Screening - Comments:: Wears aids Vision Screening - Comments:: Glasses, [facility], up to date Immunizations and Health Maintenance Health Maintenance  Topic Date Due   Colonoscopy  03/21/2023   COVID-19 Vaccine (9 - 2025-26 season) 04/21/2024   Mammogram  06/19/2025   Medicare Annual Wellness (AWV)  09/08/2025   Pneumococcal Vaccine: 50+ Years  Completed   Influenza Vaccine  Completed   Bone Density Scan  Completed   Zoster Vaccines- Shingrix  Completed   Meningococcal B Vaccine  Aged Out   DTaP/Tdap/Td  Discontinued        Assessment/Plan:  This is a routine wellness examination for Gwendolyn King.  Patient Care Team: Bair, Kalpana, MD as PCP - General (Family  Medicine) Perla Evalene PARAS, MD as PCP - Cardiology (Cardiology) Perla Evalene PARAS, MD as Consulting Physician (Cardiology) Marea Selinda RAMAN, MD as Referring Physician (Vascular Surgery) Jacobo Evalene PARAS, MD as Consulting Physician (Oncology)  I have personally reviewed and noted  the following in the patients chart:   Medical and social history Use of alcohol, tobacco or illicit drugs  Current medications and supplements including opioid prescriptions. Functional ability and status Nutritional status Physical activity Advanced directives List of other physicians Hospitalizations, surgeries, and ER visits in previous 12 months Vitals Screenings to include cognitive, depression, and falls Referrals and appointments  No orders of the defined types were placed in this encounter.  In addition, I have reviewed and discussed with patient certain preventive protocols, quality metrics, and best practice recommendations. A written personalized care plan for preventive services as well as general preventive health recommendations were provided to patient.   Angeline Fredericks, LPN   8/80/7973   Return in 1 year (on 09/08/2025).  After Visit Summary: (MyChart) Due to this being a telephonic visit, the after visit summary with patients personalized plan was offered to patient via MyChart   Nurse Notes: Discussed the need to update covid vaccine. Patient stated when she had her last colonoscopy she was told that she had aged out. Phone note sent to PCP. "

## 2024-09-09 ENCOUNTER — Other Ambulatory Visit: Payer: Self-pay | Admitting: Cardiovascular Disease

## 2024-09-09 DIAGNOSIS — E782 Mixed hyperlipidemia: Secondary | ICD-10-CM

## 2024-09-09 DIAGNOSIS — I2510 Atherosclerotic heart disease of native coronary artery without angina pectoris: Secondary | ICD-10-CM

## 2024-09-09 MED ORDER — SACUBITRIL-VALSARTAN 24-26 MG PO TABS: 1.0000 | ORAL_TABLET | Freq: Two times a day (BID) | ORAL | 3 refills | Status: AC

## 2024-09-09 MED ORDER — CARVEDILOL 6.25 MG PO TABS: 6.2500 mg | ORAL_TABLET | Freq: Two times a day (BID) | ORAL | 3 refills | Status: AC

## 2024-09-09 MED ORDER — SPIRONOLACTONE 25 MG PO TABS: 25.0000 mg | ORAL_TABLET | Freq: Every day | ORAL | 3 refills | Status: AC

## 2024-09-09 MED ORDER — CLOPIDOGREL BISULFATE 75 MG PO TABS: 75.0000 mg | ORAL_TABLET | Freq: Every day | ORAL | 3 refills | Status: AC

## 2024-09-09 NOTE — Telephone Encounter (Signed)
 Spoke with patient to make her aware of Dr Graylon recommendations. Per patient is feeling fine and isn't experiencing any pain from her fall. Patient is rescheduled from 08/26/24 to 09/29/24 at 4:00 pm. Patient verbalized understanding.

## 2024-09-12 MED ORDER — REPATHA 140 MG/ML ~~LOC~~ SOSY: 140.0000 mg | PREFILLED_SYRINGE | SUBCUTANEOUS | 0 refills | Status: AC

## 2024-09-16 ENCOUNTER — Telehealth: Payer: Self-pay | Admitting: Pharmacy Technician

## 2024-09-16 NOTE — Telephone Encounter (Signed)
 Hi, we received a refill request from [facility] for carvedilol , clopidogrel , spironolactone 

## 2024-09-26 ENCOUNTER — Other Ambulatory Visit: Payer: Self-pay | Admitting: Cardiovascular Disease

## 2024-09-29 ENCOUNTER — Ambulatory Visit

## 2024-11-03 ENCOUNTER — Ambulatory Visit: Admitting: Cardiovascular Disease

## 2025-09-09 ENCOUNTER — Ambulatory Visit
